# Patient Record
Sex: Female | Born: 1950 | Race: White | Hispanic: No | Smoking: Current every day smoker
Health system: Southern US, Community
[De-identification: ages and names within clinical notes are randomized; demographics above are authoritative.]

## PROBLEM LIST (undated history)

## (undated) DIAGNOSIS — F329 Major depressive disorder, single episode, unspecified: Secondary | ICD-10-CM

## (undated) DIAGNOSIS — I739 Peripheral vascular disease, unspecified: Secondary | ICD-10-CM

## (undated) DIAGNOSIS — M797 Fibromyalgia: Secondary | ICD-10-CM

## (undated) DIAGNOSIS — D649 Anemia, unspecified: Secondary | ICD-10-CM

## (undated) DIAGNOSIS — E785 Hyperlipidemia, unspecified: Secondary | ICD-10-CM

## (undated) DIAGNOSIS — I209 Angina pectoris, unspecified: Secondary | ICD-10-CM

## (undated) DIAGNOSIS — K56600 Partial intestinal obstruction, unspecified as to cause: Secondary | ICD-10-CM

## (undated) DIAGNOSIS — Z5189 Encounter for other specified aftercare: Secondary | ICD-10-CM

## (undated) DIAGNOSIS — Z9189 Other specified personal risk factors, not elsewhere classified: Secondary | ICD-10-CM

## (undated) DIAGNOSIS — IMO0001 Reserved for inherently not codable concepts without codable children: Secondary | ICD-10-CM

## (undated) DIAGNOSIS — I10 Essential (primary) hypertension: Secondary | ICD-10-CM

## (undated) DIAGNOSIS — I2510 Atherosclerotic heart disease of native coronary artery without angina pectoris: Secondary | ICD-10-CM

## (undated) DIAGNOSIS — R0602 Shortness of breath: Secondary | ICD-10-CM

## (undated) DIAGNOSIS — I639 Cerebral infarction, unspecified: Secondary | ICD-10-CM

## (undated) DIAGNOSIS — C55 Malignant neoplasm of uterus, part unspecified: Secondary | ICD-10-CM

## (undated) DIAGNOSIS — F32A Depression, unspecified: Secondary | ICD-10-CM

## (undated) DIAGNOSIS — F419 Anxiety disorder, unspecified: Secondary | ICD-10-CM

## (undated) HISTORY — DX: Fibromyalgia: M79.7

## (undated) HISTORY — DX: Anemia, unspecified: D64.9

## (undated) HISTORY — DX: Hyperlipidemia, unspecified: E78.5

## (undated) HISTORY — PX: OTHER SURGICAL HISTORY: SHX169

## (undated) HISTORY — DX: Cerebral infarction, unspecified: I63.9

## (undated) HISTORY — DX: Other specified personal risk factors, not elsewhere classified: Z91.89

---

## 1975-01-08 DIAGNOSIS — Z9189 Other specified personal risk factors, not elsewhere classified: Secondary | ICD-10-CM

## 1975-01-08 HISTORY — DX: Other specified personal risk factors, not elsewhere classified: Z91.89

## 1998-09-21 ENCOUNTER — Ambulatory Visit (HOSPITAL_COMMUNITY): Admission: RE | Admit: 1998-09-21 | Discharge: 1998-09-21 | Payer: Self-pay | Admitting: Gastroenterology

## 1998-11-01 ENCOUNTER — Emergency Department (HOSPITAL_COMMUNITY): Admission: EM | Admit: 1998-11-01 | Discharge: 1998-11-01 | Payer: Self-pay | Admitting: Emergency Medicine

## 2000-07-08 ENCOUNTER — Encounter: Payer: Self-pay | Admitting: Emergency Medicine

## 2000-07-08 ENCOUNTER — Emergency Department (HOSPITAL_COMMUNITY): Admission: EM | Admit: 2000-07-08 | Discharge: 2000-07-08 | Payer: Self-pay | Admitting: Emergency Medicine

## 2000-07-08 ENCOUNTER — Observation Stay (HOSPITAL_COMMUNITY): Admission: AD | Admit: 2000-07-08 | Discharge: 2000-07-09 | Payer: Self-pay | Admitting: Internal Medicine

## 2000-07-09 ENCOUNTER — Encounter: Payer: Self-pay | Admitting: Internal Medicine

## 2000-07-21 ENCOUNTER — Encounter: Admission: RE | Admit: 2000-07-21 | Discharge: 2000-07-21 | Payer: Self-pay | Admitting: Internal Medicine

## 2002-03-29 ENCOUNTER — Emergency Department (HOSPITAL_COMMUNITY): Admission: EM | Admit: 2002-03-29 | Discharge: 2002-03-29 | Payer: Self-pay | Admitting: Emergency Medicine

## 2002-11-08 ENCOUNTER — Emergency Department (HOSPITAL_COMMUNITY): Admission: AD | Admit: 2002-11-08 | Discharge: 2002-11-08 | Payer: Self-pay | Admitting: Family Medicine

## 2003-04-08 ENCOUNTER — Emergency Department (HOSPITAL_COMMUNITY): Admission: EM | Admit: 2003-04-08 | Discharge: 2003-04-09 | Payer: Self-pay | Admitting: Emergency Medicine

## 2004-02-12 ENCOUNTER — Emergency Department (HOSPITAL_COMMUNITY): Admission: EM | Admit: 2004-02-12 | Discharge: 2004-02-12 | Payer: Self-pay | Admitting: Emergency Medicine

## 2004-05-22 ENCOUNTER — Emergency Department (HOSPITAL_COMMUNITY): Admission: EM | Admit: 2004-05-22 | Discharge: 2004-05-22 | Payer: Self-pay | Admitting: Emergency Medicine

## 2004-07-18 ENCOUNTER — Emergency Department (HOSPITAL_COMMUNITY): Admission: EM | Admit: 2004-07-18 | Discharge: 2004-07-18 | Payer: Self-pay | Admitting: Emergency Medicine

## 2004-09-27 ENCOUNTER — Emergency Department (HOSPITAL_COMMUNITY): Admission: EM | Admit: 2004-09-27 | Discharge: 2004-09-27 | Payer: Self-pay | Admitting: Emergency Medicine

## 2005-01-07 DIAGNOSIS — E8941 Symptomatic postprocedural ovarian failure: Secondary | ICD-10-CM | POA: Insufficient documentation

## 2005-04-01 ENCOUNTER — Emergency Department (HOSPITAL_COMMUNITY): Admission: EM | Admit: 2005-04-01 | Discharge: 2005-04-01 | Payer: Self-pay | Admitting: Emergency Medicine

## 2005-09-11 ENCOUNTER — Ambulatory Visit: Payer: Self-pay | Admitting: Obstetrics and Gynecology

## 2005-09-26 ENCOUNTER — Ambulatory Visit: Payer: Self-pay | Admitting: Obstetrics and Gynecology

## 2005-10-15 ENCOUNTER — Ambulatory Visit (HOSPITAL_COMMUNITY): Admission: RE | Admit: 2005-10-15 | Discharge: 2005-10-15 | Payer: Self-pay | Admitting: Obstetrics and Gynecology

## 2005-11-04 ENCOUNTER — Encounter (INDEPENDENT_AMBULATORY_CARE_PROVIDER_SITE_OTHER): Payer: Self-pay | Admitting: *Deleted

## 2005-11-04 ENCOUNTER — Ambulatory Visit (HOSPITAL_COMMUNITY): Admission: RE | Admit: 2005-11-04 | Discharge: 2005-11-04 | Payer: Self-pay | Admitting: Obstetrics and Gynecology

## 2005-11-04 ENCOUNTER — Ambulatory Visit: Payer: Self-pay | Admitting: Obstetrics and Gynecology

## 2005-11-20 ENCOUNTER — Ambulatory Visit: Payer: Self-pay | Admitting: Obstetrics and Gynecology

## 2006-01-07 HISTORY — PX: ABDOMINAL HYSTERECTOMY: SHX81

## 2006-02-13 ENCOUNTER — Encounter (INDEPENDENT_AMBULATORY_CARE_PROVIDER_SITE_OTHER): Payer: Self-pay | Admitting: *Deleted

## 2006-02-13 ENCOUNTER — Encounter: Payer: Self-pay | Admitting: Obstetrics and Gynecology

## 2006-02-13 ENCOUNTER — Ambulatory Visit: Payer: Self-pay | Admitting: Obstetrics & Gynecology

## 2006-02-20 ENCOUNTER — Ambulatory Visit: Payer: Self-pay | Admitting: Obstetrics and Gynecology

## 2006-03-10 ENCOUNTER — Inpatient Hospital Stay (HOSPITAL_COMMUNITY): Admission: RE | Admit: 2006-03-10 | Discharge: 2006-03-12 | Payer: Self-pay | Admitting: Obstetrics and Gynecology

## 2006-03-10 ENCOUNTER — Ambulatory Visit: Payer: Self-pay | Admitting: Obstetrics and Gynecology

## 2006-03-10 ENCOUNTER — Encounter (INDEPENDENT_AMBULATORY_CARE_PROVIDER_SITE_OTHER): Payer: Self-pay | Admitting: Specialist

## 2006-03-18 ENCOUNTER — Ambulatory Visit: Payer: Self-pay | Admitting: Obstetrics & Gynecology

## 2006-03-20 ENCOUNTER — Ambulatory Visit: Payer: Self-pay | Admitting: Obstetrics and Gynecology

## 2006-03-20 ENCOUNTER — Ambulatory Visit (HOSPITAL_COMMUNITY): Admission: RE | Admit: 2006-03-20 | Discharge: 2006-03-20 | Payer: Self-pay | Admitting: Obstetrics and Gynecology

## 2006-04-03 ENCOUNTER — Ambulatory Visit: Payer: Self-pay | Admitting: Obstetrics & Gynecology

## 2006-04-16 ENCOUNTER — Ambulatory Visit: Payer: Self-pay | Admitting: Obstetrics & Gynecology

## 2006-04-23 ENCOUNTER — Ambulatory Visit: Payer: Self-pay | Admitting: Obstetrics & Gynecology

## 2006-04-30 ENCOUNTER — Ambulatory Visit: Payer: Self-pay | Admitting: Obstetrics and Gynecology

## 2006-07-23 ENCOUNTER — Emergency Department (HOSPITAL_COMMUNITY): Admission: EM | Admit: 2006-07-23 | Discharge: 2006-07-23 | Payer: Self-pay | Admitting: Emergency Medicine

## 2007-01-09 ENCOUNTER — Emergency Department (HOSPITAL_COMMUNITY): Admission: EM | Admit: 2007-01-09 | Discharge: 2007-01-09 | Payer: Self-pay | Admitting: Emergency Medicine

## 2007-08-12 ENCOUNTER — Emergency Department (HOSPITAL_COMMUNITY): Admission: EM | Admit: 2007-08-12 | Discharge: 2007-08-12 | Payer: Self-pay | Admitting: Emergency Medicine

## 2008-02-12 ENCOUNTER — Emergency Department (HOSPITAL_COMMUNITY): Admission: EM | Admit: 2008-02-12 | Discharge: 2008-02-12 | Payer: Self-pay | Admitting: Family Medicine

## 2008-06-28 ENCOUNTER — Emergency Department (HOSPITAL_COMMUNITY): Admission: EM | Admit: 2008-06-28 | Discharge: 2008-06-28 | Payer: Self-pay | Admitting: Emergency Medicine

## 2008-07-15 ENCOUNTER — Inpatient Hospital Stay (HOSPITAL_COMMUNITY): Admission: AD | Admit: 2008-07-15 | Discharge: 2008-07-15 | Payer: Self-pay | Admitting: Obstetrics & Gynecology

## 2009-11-17 ENCOUNTER — Emergency Department (HOSPITAL_COMMUNITY): Admission: EM | Admit: 2009-11-17 | Discharge: 2009-11-17 | Payer: Self-pay | Admitting: Family Medicine

## 2009-12-07 ENCOUNTER — Encounter (INDEPENDENT_AMBULATORY_CARE_PROVIDER_SITE_OTHER): Payer: Self-pay | Admitting: Nurse Practitioner

## 2009-12-07 ENCOUNTER — Ambulatory Visit: Payer: Self-pay | Admitting: Internal Medicine

## 2009-12-07 DIAGNOSIS — F17200 Nicotine dependence, unspecified, uncomplicated: Secondary | ICD-10-CM | POA: Insufficient documentation

## 2009-12-07 DIAGNOSIS — K089 Disorder of teeth and supporting structures, unspecified: Secondary | ICD-10-CM | POA: Insufficient documentation

## 2009-12-21 ENCOUNTER — Telehealth (INDEPENDENT_AMBULATORY_CARE_PROVIDER_SITE_OTHER): Payer: Self-pay | Admitting: Nurse Practitioner

## 2010-02-06 ENCOUNTER — Encounter: Payer: Self-pay | Admitting: Nurse Practitioner

## 2010-02-06 ENCOUNTER — Ambulatory Visit: Admit: 2010-02-06 | Payer: Self-pay | Admitting: Nurse Practitioner

## 2010-02-06 DIAGNOSIS — R599 Enlarged lymph nodes, unspecified: Secondary | ICD-10-CM | POA: Insufficient documentation

## 2010-02-08 NOTE — Progress Notes (Signed)
Summary: Requesting more antibiotics  Phone Note Call from Patient   Summary of Call: HAS DENTAL APPT DEC 29 11,BUT SHE HAS RAN OUT OF ANTIBIOTICS//WANTING TO KNOW IF SHE CVAN GET SOME MORE//[facility]  //[phone]//CLINDANYCYN/HCL 300MG  CAPS. 1 CAP BY MOPUTH 4 TIMES A DAY LAST FILLED 11/18/09//DR.ELLEN FULT Initial call taken by: Arta Bruce,  December 21, 2009 12:32 PM  Follow-up for Phone Call        Sent to N. Daphine Deutscher.  Dutch Quint RN  December 21, 2009 1:00 PM   Additional Follow-up for Phone Call Additional follow up Details #1::        yes, clindamycin refilled discussed with pt during her last visit and i knew that she was already on meds from ER. advise her to keep dental appt as it is VERY difficult to get an appt and she is lucky that they are seeing her so [place] Additional Follow-up by: Lehman Prom FNP,  December 21, 2009 1:21 PM    Additional Follow-up for Phone Call Additional follow up Details #2::    Pt. notified of refill and of Camran Keady's response.  Dutch Quint RN  December 21, 2009 2:22 PM   New/Updated Medications: CLINDAMYCIN HCL 300 MG CAPS (CLINDAMYCIN HCL) One capsule by mouth four times daily Prescriptions: CLINDAMYCIN HCL 300 MG CAPS (CLINDAMYCIN HCL) One capsule by mouth four times daily  #40 x 0   Entered and Authorized by:   Lehman Prom FNP   Signed by:   Lehman Prom FNP on 12/21/2009   Method used:   Faxed to ...       [facility] (retail)       [address] [place], [place]  [address]       Ph: [phone]       Fax: [phone]   RxID:   [id number]

## 2010-02-08 NOTE — Letter (Signed)
Summary: PT INFORMATION SHEET  PT INFORMATION SHEET   Imported By: Arta Bruce 12/07/2009 15:35:55  _____________________________________________________________________  External Attachment:    Type:   Image     Comment:   External Document

## 2010-02-08 NOTE — Assessment & Plan Note (Signed)
Summary: NEW - Establish Care   Vital Signs:  Patient profile:   60 year old female Menstrual status:  postmenopausal Height:      63 inches Weight:      116.38 pounds BMI:     20.69 Temp:     97.7 degrees F oral Pulse rate:   68 / minute Pulse rhythm:   regular Resp:     14 per minute BP sitting:   102 / 56  (left arm) Cuff size:   regular  Vitals Entered By: Hale Drone CMA (December 07, 2009 12:15 PM) CC: Here to establish care. Needs a dental referral. Has been taking her Antbx. since 11/18/09 Is Patient Diabetic? No Pain Assessment Patient in pain? yes     Location: Teeth Intensity: 4 Type: dull Onset of pain  Constant  Does patient need assistance? Functional Status Self care Ambulation Normal     Menstrual Status postmenopausal   CC:  Here to establish care. Needs a dental referral. Has been taking her Antbx. since 11/18/09.  History of Present Illness:  Pt into the office to establish care. No previous PCP She has been to the [facility] back in 2007. Partial hysterectomy.  No signficant PMH  PSH - partial hysterectomy in 2007  Social - employed at [employer] has custody of her 2 grandchildren   Current issues are problems with her teeth.  "Major infection" set in. She went to the [facility] held a couple of weeks ago but she was not able to get care. Pt went to Urgent care following that time and she was started on clindamycin and she has been taking since that time. Pt was having headache, fever, swelling She was also given an RX for percocet but she did not get filled.  She does not want to take narcotic medications nor does she want it in her house with her young kids She has been taking ibuprofen (over the counter) for pain  Habits & Providers  Alcohol-Tobacco-Diet     Alcohol drinks/day: 0     Tobacco Status: current     Tobacco Counseling: to quit use of tobacco products     Year Started: age  16  Exercise-Depression-Behavior     Drug Use: never  Current Medications (verified): 1)  Clindamycin Hcl 300 Mg Caps (Clindamycin Hcl) .Marland Kitchen.. 1 Capusle By Mouth 4 Times Day  Allergies (verified): No Known Drug Allergies  Past History:  Past Surgical History: S/p partial hysterectomy 2007  Family History: mother - breast cancer and colon cancer (deceased at age 84) father - living but pt does not know his history brother - lung cancer (deceased) maternal grandmother - cervical cancer - deceased at age 26  Social History: 2 children (1 addict daughter) custody of her 2 grandchildren separated tobacco - 1ppd ETOH - none Drug - noneSmoking Status:  current Drug Use:  never  Review of Systems General:  +dental pain - right jaw swelling, pain, tenderness. Resp:  Denies cough. GI:  Denies abdominal pain, nausea, and vomiting.  Physical Exam  General:  alert.   Head:  normocephalic.   Eyes:  glasses Ears:  ear piercing(s) noted.   Mouth:  poor dentition.   Multiple caries - root exposure Lungs:  normal breath sounds.   Heart:  normal rate and regular rhythm.   Abdomen:  normal bowel sounds.   Neurologic:  alert & oriented X3.     Impression & Recommendations:  Problem # 1:  DENTAL PAIN (ICD-525.9) will do urgent referral to dental clinic Orders: Dental Referral (Dentist)  Problem # 2:  TOBACCO ABUSE (ICD-305.1) advised cessation  Complete Medication List: 1)  Clindamycin Hcl 300 Mg Caps (Clindamycin hcl) .Marland Kitchen.. 1 capusle by mouth 4 times day  Patient Instructions: 1)  You will be scheduled an appointment at the Dental Clinic 2)  You have declined the flu vaccine today.  If you change your mind be reminded that the flu vaccine only protects you from flu. NOT cold or cough so you don't get the flu from getting the vaccine 3)  Call to schedule a follow up appointment for a complete physical exam. No food after midnight before this visit. 4)  You will get vaginal  exam, labs, EKG,mammogram   Orders Added: 1)  New Patient Level III [09323] 2)  Dental Referral [Dentist]   Not Administered:    Influenza Vaccine not given due to: declined

## 2010-02-09 NOTE — Letter (Signed)
Summary: URGENT DENTAL REFERRAL  URGENT DENTAL REFERRAL   Imported By: Arta Bruce 12/07/2009 16:36:37  _____________________________________________________________________  External Attachment:    Type:   Image     Comment:   External Document

## 2010-02-14 NOTE — Assessment & Plan Note (Signed)
Summary: Acute - LAD   Vital Signs:  Patient profile:   60 year old female Menstrual status:  postmenopausal Weight:      122.6 pounds Pulse rate:   72 / minute Pulse rhythm:   regular Resp:     16 per minute BP sitting:   86 / 60  (left arm) Cuff size:   regular  Vitals Entered By: Levon Hedger (February 06, 2010 10:10 AM) CC: was having discharge that was white and milky out of her left ear x 8 months very itchy, Ear pain Is Patient Diabetic? No Pain Assessment Patient in pain? no       Does patient need assistance? Functional Status Self care Ambulation Normal   CC:  was having discharge that was white and milky out of her left ear x 8 months very itchy and Ear pain.  History of Present Illness:  Pt into the office with c/o ear problems  Ear Pain      This is a 60 year old woman who presents with Ear pain.  The symptoms began 2 weeks ago.  "I noticed a "marble sized" ball at the back of her ear. It did not hurt.  Dentist seen since last visit here and 2 upper teeth removed.  She has others that she need removed and she is waiting funds for Engineer, manufacturing systems.  The patient complains of ear discharge, but denies hearing loss.  The pain is located in the left ear and behind the left ear.  Associated symptoms include toothache.  Admits that she had a virus about 1 week ago.  Symptoms at that time included sneezing, coughing. Pt has been using peroxide and water. No cats as pets  Habits & Providers  Alcohol-Tobacco-Diet     Alcohol drinks/day: 0     Tobacco Status: current     Tobacco Counseling: to quit use of tobacco products     Cigarette Packs/Day: 0.5     Year Started: age 60  Exercise-Depression-Behavior     Does Patient Exercise: no     Drug Use: never  Allergies (verified): No Known Drug Allergies  Social History: Does Patient Exercise:  no Packs/Day:  0.5  Review of Systems General:  Complains of fatigue; denies fever. ENT:  Complains of ear discharge;  left ear.. CV:  Denies chest pain or discomfort. Resp:  Denies cough. GI:  Denies abdominal pain, nausea, and vomiting. Neuro:  Denies headaches.  Physical Exam  General:  alert.   Head:  normocephalic.   Mouth:  poor dentition.   Lungs:  normal breath sounds.   Heart:  normal rate and regular rhythm.   Abdomen:  normal bowel sounds.     Impression & Recommendations:  Problem # 1:  LYMPHADENOPATHY (ICD-785.6) read handout advised pt that it should resolve on its own The following medications were removed from the medication list:    Clindamycin Hcl 300 Mg Caps (Clindamycin hcl) ..... One capsule by mouth four times daily  Problem # 2:  TOBACCO ABUSE (ICD-305.1) advised cessation Advised pt to quit using peroxide in ear to prevent dryness. ointment given to pt  Patient Instructions: 1)  The area behind your ear was an enlarged lymph node. 2)  This is due to a viral infection. 3)  It should improve over time.  If not over the next 1-2 weeks then inform this office. 4)  Left ear - You likely have a chronic problem with your ear. The peroxide actually dries your ears.  You may want to apply something with lubrication on the outer edge. 5)  Keep your appointment for a complete physical exam. 6)  Appointment _____________________. 7)  You will need to be fasting. No food after midnight before this visit. 8)  You will need cbc, cmp, lipids, tsh, PAP, mammogram, EKG, u/a.   Orders Added: 1)  Est. Patient Level III [04540]

## 2010-03-19 ENCOUNTER — Encounter (INDEPENDENT_AMBULATORY_CARE_PROVIDER_SITE_OTHER): Payer: Self-pay | Admitting: Nurse Practitioner

## 2010-03-27 ENCOUNTER — Encounter (INDEPENDENT_AMBULATORY_CARE_PROVIDER_SITE_OTHER): Payer: Self-pay | Admitting: Nurse Practitioner

## 2010-03-27 ENCOUNTER — Encounter: Payer: Self-pay | Admitting: Nurse Practitioner

## 2010-03-27 ENCOUNTER — Other Ambulatory Visit: Payer: Self-pay | Admitting: Nurse Practitioner

## 2010-03-27 ENCOUNTER — Other Ambulatory Visit (HOSPITAL_COMMUNITY): Payer: Self-pay | Admitting: Internal Medicine

## 2010-03-27 DIAGNOSIS — N3000 Acute cystitis without hematuria: Secondary | ICD-10-CM | POA: Insufficient documentation

## 2010-03-27 DIAGNOSIS — Z1231 Encounter for screening mammogram for malignant neoplasm of breast: Secondary | ICD-10-CM

## 2010-03-27 DIAGNOSIS — I739 Peripheral vascular disease, unspecified: Secondary | ICD-10-CM | POA: Insufficient documentation

## 2010-03-27 LAB — CONVERTED CEMR LAB
Bilirubin Urine: NEGATIVE
Glucose, Urine, Semiquant: NEGATIVE
KOH Prep: NEGATIVE
Ketones, urine, test strip: NEGATIVE
Nitrite: POSITIVE
OCCULT 1: NEGATIVE
Protein, U semiquant: NEGATIVE
Rapid HIV Screen: NEGATIVE
Specific Gravity, Urine: >=1.03
Urobilinogen, UA: 0.2
WBC Urine, dipstick: NEGATIVE
pH: 5

## 2010-03-27 LAB — CYTOLOGY - PAP

## 2010-03-28 ENCOUNTER — Encounter (INDEPENDENT_AMBULATORY_CARE_PROVIDER_SITE_OTHER): Payer: Self-pay | Admitting: Nurse Practitioner

## 2010-03-28 DIAGNOSIS — E7800 Pure hypercholesterolemia, unspecified: Secondary | ICD-10-CM | POA: Insufficient documentation

## 2010-03-28 LAB — CONVERTED CEMR LAB
ALT: 9 units/L (ref 0–35)
AST: 16 units/L (ref 0–37)
Albumin: 4.3 g/dL (ref 3.5–5.2)
Alkaline Phosphatase: 64 units/L (ref 39–117)
BUN: 19 mg/dL (ref 6–23)
Basophils Absolute: 0 10*3/uL (ref 0.0–0.1)
Basophils Relative: 0 % (ref 0–1)
CO2: 28 meq/L (ref 19–32)
Calcium: 9.6 mg/dL (ref 8.4–10.5)
Chlamydia, DNA Probe: NEGATIVE
Chloride: 102 meq/L (ref 96–112)
Cholesterol: 233 mg/dL — ABNORMAL HIGH (ref 0–200)
Creatinine, Ser: 0.67 mg/dL (ref 0.40–1.20)
Eosinophils Absolute: 0.1 10*3/uL (ref 0.0–0.7)
Eosinophils Relative: 1 % (ref 0–5)
GC Probe Amp, Genital: NEGATIVE
Glucose, Bld: 87 mg/dL (ref 70–99)
HCT: 43 % (ref 36.0–46.0)
HDL: 57 mg/dL (ref >39)
Hemoglobin: 14.3 g/dL (ref 12.0–15.0)
LDL Cholesterol: 163 mg/dL — ABNORMAL HIGH (ref 0–99)
Lymphocytes Relative: 24 % (ref 12–46)
Lymphs Abs: 2.4 10*3/uL (ref 0.7–4.0)
MCHC: 33.3 g/dL (ref 30.0–36.0)
MCV: 101.2 fL — ABNORMAL HIGH (ref 78.0–100.0)
Monocytes Absolute: 0.5 10*3/uL (ref 0.1–1.0)
Monocytes Relative: 5 % (ref 3–12)
Neutro Abs: 7 10*3/uL (ref 1.7–7.7)
Neutrophils Relative %: 70 % (ref 43–77)
Platelets: 207 10*3/uL (ref 150–400)
Potassium: 4.5 meq/L (ref 3.5–5.3)
RBC: 4.25 M/uL (ref 3.87–5.11)
RDW: 13.6 % (ref 11.5–15.5)
Sodium: 140 meq/L (ref 135–145)
TSH: 1.8 microintl units/mL (ref 0.350–4.500)
Total Bilirubin: 0.6 mg/dL (ref 0.3–1.2)
Total CHOL/HDL Ratio: 4.1
Total Protein: 6.4 g/dL (ref 6.0–8.3)
Triglycerides: 63 mg/dL (ref <150)
VLDL: 13 mg/dL (ref 0–40)
WBC: 10 10*3/uL (ref 4.0–10.5)

## 2010-03-30 ENCOUNTER — Telehealth (INDEPENDENT_AMBULATORY_CARE_PROVIDER_SITE_OTHER): Payer: Self-pay | Admitting: Nurse Practitioner

## 2010-03-30 ENCOUNTER — Encounter (INDEPENDENT_AMBULATORY_CARE_PROVIDER_SITE_OTHER): Payer: Self-pay | Admitting: Nurse Practitioner

## 2010-03-30 DIAGNOSIS — R8789 Other abnormal findings in specimens from female genital organs: Secondary | ICD-10-CM | POA: Insufficient documentation

## 2010-04-05 NOTE — Progress Notes (Signed)
Summary: Office Visit//WELL WOMAN EXAM  Office Visit//WELL WOMAN EXAM   Imported By: Arta Bruce 03/29/2010 15:58:10  _____________________________________________________________________  External Attachment:    Type:   Image     Comment:   External Document

## 2010-04-05 NOTE — Letter (Signed)
Summary: TEST ORDER FORM//MAMMOGRAM//APPPT DATE & TIME  TEST ORDER FORM//MAMMOGRAM//APPPT DATE & TIME   Imported By: Arta Bruce 03/29/2010 15:48:50  _____________________________________________________________________  External Attachment:    Type:   Image     Comment:   External Document

## 2010-04-05 NOTE — Assessment & Plan Note (Signed)
Summary: Complete Physical Exam   Vital Signs:  Patient profile:   60 year old female Menstrual status:  hysterectomy Weight:      119.2 pounds BMI:     21.19 Temp:     97.7 degrees F oral Pulse rate:   61 / minute Pulse rhythm:   regular Resp:     16 per minute BP sitting:   94 / 64  (left arm) Cuff size:   regular  Vitals Entered By: Levon Hedger (March 27, 2010 11:14 AM) CC: CPP Is Patient Diabetic? No Pain Assessment Patient in pain? no       Does patient need assistance? Functional Status Self care Ambulation Normal  Vision Screening:Left eye with correction: 20 / 20+2 Right eye with correction: 20 / 20 Both eyes with correction: 20 / 20+4        Vision Entered By: Levon Hedger (March 27, 2010 11:16 AM)     Menstrual Status hysterectomy   CC:  CPP.  History of Present Illness:  Pt into the office for a complete physical exam  Pap - Hx of abnormal PAP  s/p partial hysterectomy in 2007  Mammogram - last done 2 years ago. Mother with history of breast cancer  Optho - wears presecription glasses.  No recent eye exam.  Dental - last appt was a few months ago for a dental abscess.  she has another upcoming appt to get total extraction  tdap - last given 2.5 years ago  Habits & Providers  Alcohol-Tobacco-Diet     Alcohol drinks/day: 0     Tobacco Status: current     Tobacco Counseling: to quit use of tobacco products     Cigarette Packs/Day: 0.5     Year Started: age 28  Exercise-Depression-Behavior     Does Patient Exercise: no     Have you felt down or hopeless? no     Have you felt little pleasure in things? no     Depression Counseling: not indicated; screening negative for depression     Drug Use: never  Allergies (verified): No Known Drug Allergies  Review of Systems General:  Denies fever. Eyes:  Denies blurring. ENT:  Denies earache. CV:  When trying to walk up an incline then she has pain in bilateral legs.  Pain eases  when she stops. started about 1 year ago and is getting progressively. Resp:  Denies cough. GI:  Denies abdominal pain, nausea, and vomiting. GU:  Denies discharge. MS:  Complains of low back pain. Derm:  Denies lesion(s). Neuro:  Complains of numbness and tingling; bilateral hand and finger. Psych:  Denies anxiety and depression.  Physical Exam  General:  alert.   Head:  normocephalic.   Eyes:  glasses Ears:  left ear - clear crust on external canal right ear - TM visible with bony landmarks present Nose:  no external erythema.   Mouth:  fair dentition.   Neck:  supple.   Chest Wall:  no mass.   Breasts:  right breast tenderness left - no masses noted Lungs:  normal breath sounds.   Heart:  regular rhythm.   Abdomen:  normal bowel sounds.   Rectal:  external hemorrhoid(s).    Pelvic Exam  Vulva:      normal appearance.   Urethra and Bladder:      Urethra--normal.   Vagina:      physiologic discharge.   Cervix:      absent Uterus:      absent Adnexa:  nontender bilaterally.   Rectum:      heme negative stool, + external hemorrhoids.      Impression & Recommendations:  Problem # 1:  ROUTINE GYNECOLOGICAL EXAMINATION (ICD-V72.31) labs done PAP done rec opth and dental  Orders: Hemoccult Guaiac-1 spec.(in office) (82270) UA Dipstick w/o Micro (manual) (16109) EKG w/ Interpretation (93000) KOH/ WET Mount [phone]) Pap Smear, Thin Prep ( Collection of) [phone]) T- GC Chlamydia (91478) T-TSH [phone]) Rapid HIV  (57846) T-Syphilis Test (RPR) [phone]) T-Lipid Profile (24401-02725) T-Comprehensive Metabolic Panel (36644-03474) T-CBC w/Diff (25956-38756)  Problem # 2:  OTHER SCREENING BREAST EXAMINATION (ICD-V76.19)  Orders: Mammogram (Screening) (Mammo)  Problem # 3:  CYSTITIS, ACUTE (ICD-595.0)  Orders: T-Culture, Urine (43329-51884)  Her updated medication list for this problem includes:    Ciprofloxacin Hcl 500 Mg Tabs (Ciprofloxacin  hcl) ..... One tablet by mouth two times a day for infection  Problem # 4:  INTERMITTENT CLAUDICATION (ICD-443.9) very likely since pt has several risk factors  Complete Medication List: 1)  Ciprofloxacin Hcl 500 Mg Tabs (Ciprofloxacin hcl) .... One tablet by mouth two times a day for infection  Patient Instructions: 1)  Start taking a enteric coated Aspirin 81mg  by mouth daily.  This will help some with circulation. 2)  This is likely the problem with your legs as well.  Read the handout.  There are some medications that may can help with this but will see what aspirin does first 3)  You have an infection in your urine.  Be sure to drink plenty of water. You should take cipro 500mg  by mouth two times a day x 7 days. 4)  Keep your appointment for a mammogram. 5)  Follow up in 4 weeks to continue to discuss problems with legs and hands after starting aspirin Prescriptions: CIPROFLOXACIN HCL 500 MG TABS (CIPROFLOXACIN HCL) One tablet by mouth two times a day for infection  #14 x 0   Entered and Authorized by:   Lehman Prom FNP   Signed by:   Hale Drone CMA on 03/27/2010   Method used:   Print then Give to Patient   RxID:   [id number]    Orders Added: 1)  Est. Patient age 6-64 [99396] 2)  Hemoccult Guaiac-1 spec.(in office) [82270] 3)  UA Dipstick w/o Micro (manual) [81002] 4)  EKG w/ Interpretation [93000] 5)  KOH/ WET Mount [87210] 6)  Pap Smear, Thin Prep ( Collection of) [Q0091] 7)  T- GC Chlamydia [55732] 8)  T-TSH [20254-27062] 9)  Rapid HIV  [92370] 10)  T-Syphilis Test (RPR) [37628-31517] 11)  T-Lipid Profile [80061-22930] 12)  T-Comprehensive Metabolic Panel [80053-22900] 13)  T-CBC w/Diff [61607-37106] 14)  T-Culture, Urine [26948-54627] 15)  Mammogram (Screening) [Mammo]    Laboratory Results   Urine Tests  Date/Time Received: March 27, 2010 12:07 PM   Routine Urinalysis   Color: lt. yellow Glucose: negative   (Normal Range: Negative) Bilirubin:  negative   (Normal Range: Negative) Ketone: negative   (Normal Range: Negative) Spec. Gravity: >=1.030   (Normal Range: 1.003-1.035) Blood: large   (Normal Range: Negative) pH: 5.0   (Normal Range: 5.0-8.0) Protein: negative   (Normal Range: Negative) Urobilinogen: 0.2   (Normal Range: 0-1) Nitrite: positive   (Normal Range: Negative) Leukocyte Esterace: negative   (Normal Range: Negative)    Date/Time Received: March 27, 2010 12:48 PM   [employer] [place] Source: vaginal  WBC/hpf: 1-5 Bacteria/hpf: rare Clue cells/hpf: none Yeast/hpf: none Wet Mount KOH: Negative Trichomonas/hpf: none  Other Tests  Rapid HIV: negative  Stool - Occult Blood Hemmoccult #1: negative Date: 03/27/2010     EKG  Procedure date:  03/27/2010  Findings:      sinus - incomplete right BBB

## 2010-04-10 ENCOUNTER — Ambulatory Visit (HOSPITAL_COMMUNITY)
Admission: RE | Admit: 2010-04-10 | Discharge: 2010-04-10 | Disposition: A | Discharge status: Home / Self Care | Payer: Self-pay | Source: Ambulatory Visit | Attending: Internal Medicine | Admitting: Internal Medicine

## 2010-04-10 DIAGNOSIS — Z1231 Encounter for screening mammogram for malignant neoplasm of breast: Secondary | ICD-10-CM | POA: Insufficient documentation

## 2010-04-10 NOTE — Progress Notes (Signed)
Summary: PAP results///// GYN referral  Phone Note Outgoing Call   Summary of Call: notify pt that her PAP showed some abnormal cells. I know that she had a history of abnormal PAP Smear - I can refer to GYN here at [facility] so she can get further info and to ask questions about her ovaries and so forth Initial call taken by: Lehman Prom FNP,  March 30, 2010 5:05 PM  Follow-up for Phone Call        Next [facility] clinic is in June.What would you like for me  to do?  Wait to see response from [place] when she contacts pt to see if she wants to be referred.. If so, then ok to schedule at next available appt Follow-up by: Candi Leash,  April 02, 2010 10:16 AM  Additional Follow-up for Phone Call Additional follow up Details #1::        Levon Hedger  April 03, 2010 10:32 AM Left message on machine for pt ot return call the the office.  Levon Hedger  April 03, 2010 3:02 PM spoke with pt and she is informed of above information and would like to be referred to the GYN clinic at [facility] she needs it to be scheduled on a Tues day or a Thursday.  New Problems: PAP SMEAR, LGSIL, ABNORMAL (ICD-795.09)   Additional Follow-up for Phone Call Additional follow up Details #2::    [facility] - ok to schedule pt a GYN appt on next available Follow-up by: Lehman Prom FNP,  April 04, 2010 8:46 AM  Additional Follow-up for Phone Call Additional follow up Details #3:: Details for Additional Follow-up Action Taken: Will sched pt in [facility] Additional Follow-up by: Candi Leash,  April 04, 2010 9:03 AM  New Problems: PAP SMEAR, LGSIL, ABNORMAL (ICD-795.09)

## 2010-04-15 LAB — CBC
HCT: 40.1 % (ref 36.0–46.0)
Hemoglobin: 14.3 g/dL (ref 12.0–15.0)
MCHC: 35.6 g/dL (ref 30.0–36.0)
MCV: 98.9 fL (ref 78.0–100.0)
Platelets: 176 10*3/uL (ref 150–400)
RBC: 4.05 MIL/uL (ref 3.87–5.11)
RDW: 13.9 % (ref 11.5–15.5)
WBC: 10 10*3/uL (ref 4.0–10.5)

## 2010-04-15 LAB — COMPREHENSIVE METABOLIC PANEL
ALT: 13 U/L (ref 0–35)
AST: 17 U/L (ref 0–37)
Albumin: 3.8 g/dL (ref 3.5–5.2)
Alkaline Phosphatase: 62 U/L (ref 39–117)
BUN: 17 mg/dL (ref 6–23)
CO2: 30 mEq/L (ref 19–32)
Calcium: 9.1 mg/dL (ref 8.4–10.5)
Chloride: 102 mEq/L (ref 96–112)
Creatinine, Ser: 0.7 mg/dL (ref 0.4–1.2)
GFR calc Af Amer: >60 mL/min (ref >60)
GFR calc non Af Amer: >60 mL/min (ref >60)
Glucose, Bld: 87 mg/dL (ref 70–99)
Potassium: 3.8 mEq/L (ref 3.5–5.1)
Sodium: 138 mEq/L (ref 135–145)
Total Bilirubin: 0.3 mg/dL (ref 0.3–1.2)
Total Protein: 6.1 g/dL (ref 6.0–8.3)

## 2010-04-15 LAB — DIFFERENTIAL
Basophils Absolute: 0.1 10*3/uL (ref 0.0–0.1)
Basophils Relative: 1 % (ref 0–1)
Eosinophils Absolute: 0.1 10*3/uL (ref 0.0–0.7)
Eosinophils Relative: 1 % (ref 0–5)
Lymphocytes Relative: 27 % (ref 12–46)
Lymphs Abs: 2.7 10*3/uL (ref 0.7–4.0)
Monocytes Absolute: 0.6 10*3/uL (ref 0.1–1.0)
Monocytes Relative: 6 % (ref 3–12)
Neutro Abs: 6.5 10*3/uL (ref 1.7–7.7)
Neutrophils Relative %: 65 % (ref 43–77)

## 2010-04-15 LAB — RAPID URINE DRUG SCREEN, HOSP PERFORMED
Amphetamines: NOT DETECTED
Barbiturates: NOT DETECTED
Benzodiazepines: NOT DETECTED
Cocaine: NOT DETECTED
Opiates: NOT DETECTED
Tetrahydrocannabinol: NOT DETECTED

## 2010-04-15 LAB — URINALYSIS, ROUTINE W REFLEX MICROSCOPIC
Bilirubin Urine: NEGATIVE
Glucose, UA: NEGATIVE mg/dL
Ketones, ur: NEGATIVE mg/dL
Leukocytes, UA: NEGATIVE
Nitrite: NEGATIVE
Protein, ur: NEGATIVE mg/dL
Specific Gravity, Urine: 1.02 (ref 1.005–1.030)
Urobilinogen, UA: 0.2 mg/dL (ref 0.0–1.0)
pH: 6.5 (ref 5.0–8.0)

## 2010-04-15 LAB — URINE MICROSCOPIC-ADD ON

## 2010-04-24 LAB — POCT RAPID STREP A (OFFICE): Streptococcus, Group A Screen (Direct): NEGATIVE

## 2010-05-25 NOTE — Op Note (Signed)
NAME:  Kimberly [name], Kimberly [name]               ACCOUNT NO.:  [id number]   MEDICAL RECORD NO.:  [id number]          PATIENT TYPE:  INP   LOCATION:                                FACILITY:  [facility]   PHYSICIAN:  Phil D. Okey Dupre, M.D.     DATE OF BIRTH:  Jan 28, 1950   DATE OF PROCEDURE:  03/10/2006  DATE OF DISCHARGE:  03/12/2006                               OPERATIVE REPORT   PROCEDURE:  Total vaginal hysterectomy.   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   POSTOPERATIVE DIAGNOSIS:  Pending pathology report.   SURGEON:  Javier Glazier. Okey Dupre, M.D.   FIRST ASSISTANT:  Shelbie Proctor. Shawnie Pons, M.D.   ESTIMATED BLOOD LOSS:  20 mL.   POSTOPERATIVE CONDITION:  Satisfactory.   ANESTHESIA:  General.   SPECIMEN:  Uterus of less than 250 grams to pathology.   PROCEDURE:  Under satisfactory general anesthesia, the patient in dorsal  lithotomy position; the perineum and vagina were prepped and draped in  the usual sterile manner.  The weighted speculum was placed to the  posterior portion of the vagina and entire circumference of cervix was  injected with 1% Xylocaine with 1:200,000 epinephrine, for a total of 10  mL.  The cervix was grasped with Lahey clamps and a circular incision  made approximately 0.5 cm away from the distal end of the cervix, around  the entire circumference of the cervix, and the mucosa was pushed away  from the distal end of the cervix by blunt dissection.  The peritoneal  cavity was entered beneath the bladder by sharp dissection, and the cul-  de-sac entered by sharp dissection.  The pedicles containing the  uterosacral ligaments were clamped with rear clamps, divided and ligated  with one chromic catgut suture ligature.  These were held along.  Then,  using an electric clamp, the next pedicle was grasped, to include the  uterine vessels and portion of the broad ligament.  This was divided  after coagulation had taken place.  A free tie was then placed through  the opening  into the anterior opening into the peritoneal cavity, and  tied around the remaining pedicle clamp.  A clamp was placed medial to  the aforementioned tie.  The medial tissue was divided.  The lateral  pedicle was ligated with #1 chromic catgut suture ligatures.  The area  was observed for bleeding and as was noted these  were cut short.  The cuff was then closed with a continuous running,  locked 0 Vicryl on an atraumatic needle.  Foley catheter was draining  clear amber urine and placed in the bladder at the end of the procedure.  The patient was transferred to the recovery room in satisfactory  condition, having tolerated the procedure well.           ______________________________  Javier Glazier. Okey Dupre, M.D.     PDR/MEDQ  D:  03/10/2006  T:  03/10/2006  Job:  [id number]

## 2010-05-25 NOTE — H&P (Signed)
NAME:  Kimberly [name], Kimberly [name]               ACCOUNT NO.:  [id number]   MEDICAL RECORD NO.:  [id number]          PATIENT TYPE:  AMB   LOCATION:                                [facility]   PHYSICIAN:  Phil D. Okey Dupre, M.D.     DATE OF BIRTH:  02-26-1950   DATE OF ADMISSION:  03/10/2006  DATE OF DISCHARGE:                              HISTORY & PHYSICAL   SCHEDULED SURGERY:  Monday, March 10, 2006, at 12:30 p.m. at [facility].   CHIEF COMPLAINT:  I have cervical cancer.   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  The pathology showed high-  grade intraepithelial lesion CIN-3, and a high-grade intraepithelial  lesion present in the endocervical margin of the resection.  The patient  came back recently for a Pap smear.  This time the high-grade  intraepithelial CIN-2/VaIN-2, CIN-3/VaIN-3.  This time the high-grade  intraepithelial CIN-2/VaIN-2, CIN-3/VaIN-3 and carcinoma in situ.  She  also had a Trichomonas infection which was treated and she is scheduled  for a total vaginal hysterectomy, bilateral salpingo-oophorectomy.  The  patient has not had a menstrual period in 5-10 years.  She has had no  vaginal bleeding or spotting.  Has few hot flashes.   PAST MEDICAL HISTORY:  Noncontributory.   FAMILY HISTORY:  Had a very strong family history of cancer; her  grandmother, mother, brother, and father have all had cancer of mainly  lung cancer.  The patient is a heavy smoker, a pack of cigarettes a day.   REVIEW OF SYSTEMS:  Numbness and weakening of the fingers and pain in  the legs, especially at night.  She has had some recent vaginal itching  which was treated with Diflucan.  No history of surgery.  No known  medical allergies.   MEDICATIONS:  The patient is on Arthritis Strength Tylenol, fish oil  capsules, and calcium.   PHYSICAL  EXAMINATION:  VITAL SIGNS:  The patient's blood pressure is  108/65, her pulse is 68 per minute, respirations 14 per minute,  temperature 98.6.  The patient is 5 feet 4 inches tall, weighs 140  pounds.  HEENT:  PERRLA, within normal limits.  NECK:  Supple.  Thyroid symmetrical with no dominant masses.  LUNGS:  Clear to auscultation and percussion.  BACK:  Erect.  HEART:  No murmur, normal sinus rhythm.  ABDOMEN:  Soft, flat, nontender.  No masses, no organomegaly.  EXTREMITIES:  No edema, no varices.  Deep tendon reflexes within normal  limits.  GENITALIA:  External genitals are normal, BUS within normal limits.  The  vagina is clean, well rugated.  The cervix is clean, scarred from  previous cone, and parous.  The uterus is anterior; of normal size,  shape consistency.  The adnexa is normal.  Rectal exam is no dominant  masses.   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   PLAN:  Total vaginal hysterectomy and  bilateral salpingo-oophorectomy.  The patient came in with her daughter today and we discussed the  procedure and we explained to her the procedure as well as postoperative  followup.  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  We have counseled the patient as we did before to stop  smoking.  She says it is very difficult.  We told her we would start her  on medication postoperatively, when she goes home to consider stopping  when she is ready.  We talked about the possible complications of the  surgery, especially those related to anesthesia, hemorrhage and  infection, and those related to injury to urinary tract and  gastrointestinal tract.           ______________________________  Javier Glazier Okey Dupre, M.D.     PDR/MEDQ  D:  03/06/2006  T:  03/06/2006  Job:  [id number]

## 2010-05-25 NOTE — Group Therapy Note (Signed)
NAME:  Kimberly [name], OLAES NO.:  [id number]   MEDICAL RECORD NO.:  [id number]          PATIENT TYPE:  WOC   LOCATION:  [facility]                   FACILITY:  [facility]   PHYSICIAN:  Dorthula Perfect, MD     DATE OF BIRTH:  01-Dec-1950   DATE OF SERVICE:                                  CLINIC NOTE   This 60 year old female underwent vaginal hysterectomy for carcinoma in  situ of the cervix.  She was discharged on the second postoperative day.  Surgery was performed March 10, 2006.  She was seen here March 20, 2006  with difficulty voiding.  Urinalysis at that time was abnormal and urine  cultures showed Klebsiella pneumoniae.  She was treated with Macrobid.   She has finished the antibiotic.  She is having to urinate very  frequently and more often than not only voids very small amounts.  She  thinks that perhaps 4 times a day she does get her bladder empty. She  only has to get up once during the night, however, to urinate.  She  feels almost continuous low abdominal pressure.  Energy wise she is  doing well.  She is walking a mild a day.   She has increased her fluid intake and states that she believes that she  drinks about a gallon and a half of fluid a day, including a lot of  milk, some cranberry juice and water.   PHYSICAL EXAMINATION:  VITAL SIGNS:  Height 5 feet 4 inches, weight 137,  blood pressure is 133/79.  ABDOMEN:  Soft, nontender.  PELVIC:  Examination which was performed without speculum in order to do  a bimanual examination to check the size of the bladder IE a very rough  way of determining residual urine notes that there is probably 100 mL or  more in the bladder at this time.  She is somewhat tender.   Urine Multistix shows a large amount of blood, pH 5.0 and positive  nitrites.   The patient has a real income problem now as far as affording any  medications we prescribe.  1. She was given a urine for routine and for cultures.  2. Prescription  for Keflex 500 mg to take one 4 times a day for 10      days, 40 tablets generic is written for.  3. She is given a prescription for Ditropan 10 mg, 14 tablets to take      1 twice a day.  However, because she might not be able to get this      because of her money situation.  4. Most important I have asked her to decrease the huge volume of      fluid intake.  I have asked her to consume no more than 8, 8 ounce      glasses of water with a little bit of this being cranberry juice.      I have asked her not to be drinking milk at this time as that is      more of a food than a fluid to be considered.  I believe a good  part of her problem is the excessive fluid intake and a bladder      that is not able to handle this so soon postop.  She will return      here in 2 weeks.  She may return to work.          ______________________________  Dorthula Perfect, MD    ER/MEDQ  D:  04/03/2006  T:  04/03/2006  Job:  [id number]

## 2010-05-25 NOTE — Group Therapy Note (Signed)
NAME:  HIEN, CUNLIFFE NO.:  [id number]   MEDICAL RECORD NO.:  [id number]          PATIENT TYPE:  WOC   LOCATION:  [facility]                   FACILITY:  [facility]   PHYSICIAN:  Argentina Donovan, MD        DATE OF BIRTH:  Nov 21, 1950   DATE OF SERVICE:                                  CLINIC NOTE   The patient is a 60 year old white English-speaking female gravida 3,  para 2-0-1-2 who had a severely abnormal Pap smear back in August.  Underwent conization of the cervix for CIN II to III.  Pathology showed  high-grade intraepithelial lesion, CIN III, and a high-grade  intraepithelial lesion present at the endocervical margin of the  resection.  The patient came back in recently for a Pap smear.  This  time high-grade intraepithelial lesion CIN II, VAIN II, CIN III VAIN III  and carcinoma in situ.  She also had a trichomonas infection, for which  we will give her Flagyl today, and we had a long discussion with her,  and we are going to schedule her for vaginal hysterectomy.   IMPRESSION:  Carcinoma in situ of the cervix.           ______________________________  Argentina Donovan, MD     PR/MEDQ  D:  02/20/2006  T:  02/20/2006  Job:  [id number]

## 2010-05-25 NOTE — Discharge Summary (Signed)
NAMESEJLA, MARZANO               ACCOUNT NO.:  [id number]   MEDICAL RECORD NO.:  [id number]          PATIENT TYPE:  INP   LOCATION:  9316                          [facility]ACILITY:  [facility]   PHYSICIAN:  Phil D. Okey Dupre, M.D.     DATE OF BIRTH:  01/24/1950   DATE OF ADMISSION:  03/10/2006  DATE OF DISCHARGE:  03/12/2006                               DISCHARGE SUMMARY   The patient is a 60 year old Caucasian female who underwent total  vaginal hysterectomy on the day of admission for carcinoma in situ after  recurrent severe cervical dysplasia following a conization of the  cervix.  The patient did well postoperatively.  On the day of discharge  has a hemoglobin of 12 with a hematocrit of 34.4, down from 14.8 and  42.8 on admission.  Her white count on the day prior to discharge was  20,000; it is 11,600 on the day of discharge.  The patient has been  afebrile since her surgery.  Has not required any additional pain  medication over what was ordered immediately postoperative time.   On examination prior to discharge, the patient's lungs were clear to  auscultation and percussion.  HEART:  No murmur, normal sinus rhythm.  The abdomen was soft, flat, nontender.  No masses, no organomegaly.  No  genital bleeding.  EXTREMITIES:  __________  positive Homans' sign.  No pain, no edema.   The patient is voiding well, passing flatus but not had a bowel movement  as yet.  She has had no sign of any nausea or vomiting.   The patient was given discharge prescriptions the day prior to  discharge, so the family could fill them for Motrin 600 #30 one q.6h.  and Percocet 5 mg #40 one q.4h. p.r.n.  She is also going to be ordered  a Dulcolax suppository which she desires to take when she gets home that  will be given to her prior to discharge.  There will be a follow-up on  this patient in the GYN clinic in 2 weeks.  She has been given discharge  instructions as to activity, especially related to lifting,  stairs and  driving and also follow-up in 2 weeks in the GYN clinic.  She has been  instructed to call or come in if she has any excessive bleeding,  inability to void, increased abdominal girth or high fever.   DISCHARGE DIAGNOSIS:  Is pending pathology report.           ______________________________  Javier Glazier. Okey Dupre, M.D.     PDR/MEDQ  D:  03/12/2006  T:  03/12/2006  Job:  [id number]

## 2010-05-25 NOTE — Group Therapy Note (Signed)
NAME:  Kimberly, [name] NO.:  [id number]   MEDICAL RECORD NO.:  [id number]          PATIENT TYPE:  WOC   LOCATION:  [facility]                   [facility]ACILITY:  [facility]   PHYSICIAN:  Argentina Donovan, MD        DATE OF BIRTH:  1950-04-02   DATE OF SERVICE:  03/20/2006                                  CLINIC NOTE   The patient is a 60 year old white female who underwent total vaginal  hysterectomy on March 10, 2006, for severe cervical dysplasia and  menometrorrhagia.  She had CIN-3 in the cervix, otherwise the uterus was  unremarkable.  The patient came in 2 days ago to the [facility] with pelvic  pressure and urinary hesitancy and frequency.  A urinalysis at the time  showed large blood, large protein and too numerous to count white cells.  The patient was placed on Macrobid and came in today, the symptoms are  much less.  The patient still has a feeling that she cannot completely  empty, but does not have the false urge any more.   PHYSICAL EXAMINATION:  ABDOMEN:  Soft, flat, nontender, no masses or  organomegaly.  PELVIC:  Bimanual pelvic examination, the bladder feels full.  We are  going to have her empty her bladder and then check her for residual  urine.  We have counseled her as to increasing her fluid intake, as it  did show some sign of dehydration when she had her urinalysis the other  day, and she has been constipated in spite of taking stool softeners.   IMPRESSION:  Since she is not taking her Percocet any more that the  constipation is probably due to a lack of fluid intake.  Otherwise  satisfactory progress.  If there is a residual urine we will put in a  Foley catheter.  If there is not, I am going to send her for a pelvic  ultrasound to rule out any kind of collection.           ______________________________  Argentina Donovan, MD     PR/MEDQ  D:  03/20/2006  T:  03/22/2006  Job:  [id number]

## 2010-05-25 NOTE — Group Therapy Note (Signed)
NAME:  Kimberly [name], GALLION NO.:  [id number]   MEDICAL RECORD NO.:  [id number]          PATIENT TYPE:  WOC   LOCATION:  [facility]                   [facility]ACILITY:  [facility]   PHYSICIAN:  Dorthula Perfect, MD     DATE OF BIRTH:  02/06/1950   DATE OF SERVICE:                                  CLINIC NOTE   This 60 year old female returns for results.  She underwent hysterectomy  for carcinoma in situ of the cervix March 10, 2006.  Postoperatively, she  had a Klebsiella infection treated with Macrobid.  She was seen here  April 03, 2006 with urinary frequency.  She had an abnormal urinalysis  that showed a  large amount of blood, positive nitrates, and urine  culture showed Klebsiella again, greater than 100,000 colonies.  The  sensitivity was intermediate for nitro Furadantin, so she was placed on  cephalosporin.  She is taking it 3 times a day rather than 4 times a day  because she has developed yeast infection.  She finishes up the  antibiotic later today or in the morning.   She has cut back on the fluid intake, which was markedly excessive,  which I am sure contributed to the urinary frequency.  She states that  she has no problems with voiding now.   The screening urine today showed a large amount of blood, __________  present.  I did not examine her.  She will return in 1 week for a  urinalysis to check on the amount of blood, but also to have another  urine culture done.  She is return a week after that for the results.  If the urine still shows the presence of blood, and/or she still has  urinary tract infection, she probably needs a urologic workup.  At the  end of our discussion she related that last November she also had a  urinary tract infection.           ______________________________  Dorthula Perfect, MD     ER/MEDQ  D:  04/16/2006  T:  04/16/2006  Job:  [id number]

## 2010-05-25 NOTE — Group Therapy Note (Signed)
NAME:  Kimberly [name], Kimberly [name] NO.:  [id number]   MEDICAL RECORD NO.:  [id number]          PATIENT TYPE:  WOC   LOCATION:  [facility]                   [facility]ILITY:  [facility]   PHYSICIAN:  Dorthula Perfect, MD     DATE OF BIRTH:  1950-02-25   DATE OF SERVICE:  02/13/2006                                  CLINIC NOTE   GYN NOTE   This 60 year old white female, gravida 3, para 2, abortus 1 was seen  here in September with an abnormal Pap smear.  Biopsy showed CIN II-III  and she underwent a cold knife conization of the cervix.  The report  showed high-grade squamous intraepithelial lesion - CIN III.  It showed  the high-grade squamous epithelial lesion is present at the endocervical  margin of the resection and the comment notes that evidence of invasion  is not seen.  There was endocervical gland involvement.  She was  recommended to have a repeat Pap smear in 4 months and she has an  appointment next month to have that done.   She has not had a menstrual period in the past 5-10 years.  She has had  no vaginal bleeding or spotting.  She has a few hot flashes.  She smokes  at least one pack of cigarettes a day.  She had a mammogram this past  October which was normal.   Her complaint today is twofold:  1. She has lower mid abdominal pain.  2. She has back pain that goes down her legs.  The low mid abdominal pain has been present for at least a month.  She  has a lot of cramping.  She does not have urinary frequency but does  have nocturia and goes at least 4 times every night.  This is a definite  change for her.  She is drinking cranberry juice.  She has also had mid  and low abdominal pain for some time now.  This pain she says goes out  into the outside of both upper legs and down to the region of the knee.  This pain is worse when she lifts and when she goes up stairs.  She does  not have any knee numbness there.   She does not have a family physician.  She is not on  Medicaid.  She has  no GI complaints.   PHYSICAL EXAMINATION:  Height 5 foot 4, weight 143.  Blood pressure  108/65.  EXAMINATION OF HER ABDOMEN:  The abdomen is soft.  She does have some  suprapubic area tenderness.  There is no guarding present.  EXAMINATION OF HER BACK reveals low thoracic and upper lumbar tenderness  in the midline.  There is also bilateral sacral sciatic notch area  tenderness greater on the right than the left.  PELVIC EXAM:  External genitalia and BUS glands were normal.  Vaginal  vault was epithelialized.  The cervix deviates slightly to the right and  shows evidence of scarring secondary to the conization.  Bimanual exam  of the uterus is in the midline and I believe is of normal size and  shape.  It is a little difficult because of the tenderness that is  mentioned above.  Adnexal areas are negative.   Clean catch urine was obtained.  Urinalysis here in the clinic reveals  specific gravity of 1.030.  There was a large amount of blood which was  not evident grossly.  Nitrates are negative but she does have a small  amount of leuko esterase of leukocytes present.   DIAGNOSES:  1. Microhematuria, probable bladder infection.  2. Low back pain.  3. History of CIN III.   DISPOSITION:  1. Clean catch urine will be sent for culture.  2. She will return in a week for result of the culture and then      antibiotics.  If the culture is negative, we will need to repeat      the urinalysis and if blood is still present she will need to be      referred to a Urologist.  3. She will need evaluation of her back pain.  Most likely she will      need to be seen by the Orthopedic Service.  The nurses here in the      clinic have her a re-referral to [facility] for financial      evaluation and then an appointment with the Orthopedic Service.      Income-wise, I wonder if she qualifies for Medicaid.  4. I have recommended ibuprofen or Aleve to help with the back  pain.           ______________________________  Dorthula Perfect, MD     ER/MEDQ  D:  02/13/2006  T:  02/13/2006  Job:  [id number]

## 2010-05-25 NOTE — Op Note (Signed)
NAME:  Kimberly [name], Kimberly [name]               ACCOUNT NO.:  [id number]   MEDICAL RECORD NO.:  [id number]          PATIENT TYPE:  AMB   LOCATION:  [facility]                           [facility]ACILITY:  [facility]   PHYSICIAN:  Phil D. Okey Dupre, M.D.     DATE OF BIRTH:  Feb 07, 1950   DATE OF PROCEDURE:  11/04/2005  DATE OF DISCHARGE:                                 OPERATIVE REPORT   PROCEDURE:  Cold knife conization of the cervix.   PREOPERATIVE DIAGNOSIS:  Severe cervical dysplasia, cervical intraepithelial  neoplasia II-III.   POSTOPERATIVE DIAGNOSIS:  Pending pathology report.   SURGEON:  Javier Glazier. Okey Dupre, M.D.   SPECIMEN:  The cervical conization to pathology.   ANESTHESIA:  General.   ESTIMATED BLOOD LOSS:  10 cc.   POSTOPERATIVE CONDITION:  Satisfactory.   PROCEDURE:  Under satisfactory general anesthesia, with the patient in  dorsal lithotomy position, the perineum and vagina were prepped and draped  in the usual sterile manner.  Bimanual pelvic examination under anesthesia  revealed the uterus of normal size, shape, consistency, anterior flexed,  freely movable with normal free adnexa.  A weighted speculum was placed  posterior portion of the vagina through a marital introitus.  BUS was within  normal limits.  Vagina is clean and well rugated, with first-degree  cystocele.  The cervix was grasped with a single-tooth tenaculum, but  came  down very poorly. Lateral sutures of #1 chromic figure-of-eights were placed  in each of the lateral cervical angles, approximately 2 cm from the distal  end using #1 chromic. These were held long for traction. A #1 chromic suture  was then run around the cervix as high as possible very superficially in an  exterior starting at 12 o'clock and going around the entire circumference,  trying to stay at least 1.5-2 cm above the cervical os.  Once this was done,  this was tied down tightly. The cervix was stained with Lugol solution, with  three main areas of nonstaining area at  7 o'clock, at 12 o'clock, and at 3  o'clock. Using an angled knife and staying outside of the nonstained area  which was approximately 1 cm from the edge of the external os, a  circumferential incision was made approximately 0.5 cm deep. The anterior  and posterior lips of the biopsy site were then grasped with Allis clamps,  and the remainder of the conization was carried out, extending up to about  1.5 cm into the cervix.  This was sent for pathological diagnosis.  There  was one bleeding area on the posterior surface of the cervix which was  controlled with figure-of-eight #1 chromic catgut suture. Otherwise, the  operative site was dry.  The angle sutures were cut short and the patient  transferred to the recovery room in satisfactory condition, having tolerated  the procedure well.           ______________________________  Javier Glazier. Okey Dupre, M.D.     PDR/MEDQ  D:  11/04/2005  T:  11/05/2005  Job:  [id number]

## 2010-05-25 NOTE — Group Therapy Note (Signed)
NAME:  Kimberly [name], Kimberly [name] NO.:  [id number]   MEDICAL RECORD NO.:  [id number]          PATIENT TYPE:  WOC   LOCATION:  [facility]                   [facility]ACILITY:  [facility]   PHYSICIAN:  Argentina Donovan, MD        DATE OF BIRTH:  July 18, 1950   DATE OF SERVICE:  09/26/2005                                    CLINIC NOTE   The patient is a 60 year old, gravida 3, para 2-0-1-2, who was seen on  September 5 because of a severely abnormal Pap smear.  On examination there  was a large erosion of the cervix that was very friable and the biopsy  showed high-grade squamous intraepithelial lesion, CIN II-III with some  endocervical atypia.  The patient, because of friability and the seriousness  of the diagnosis, we are going to treat her with a cold knife conization of  the cervix.   SOCIAL HISTORY:  The patient is a smoker.   PAST MEDICAL HISTORY:  She has no serious illnesses.   MEDICATIONS:  She is on aspirin and Aleve for foot pain, which she has been  told not to take during the week prior to surgery.   PHYSICAL EXAMINATION:  She has a normal blood pressure.  She weighs 150  pounds and is 5 feet 4 inches tall, blood pressure 129/74.  HEENT:  Within normal limits.  LUNGS:  Clear to auscultation and percussion.  HEART:  No murmur, normal sinus rhythm.  NECK:  Supple with no masses.  BREASTS:  Symmetrical with no dominant masses.  ABDOMEN:  Soft, flat, nontender.  No masses or organomegaly.  PELVIC EXAM:  External genitalia is normal.  BUS is within normal limits.  The vagina is clean and well rugated.  The cervix is clean and parous and  markedly eroded as previously described.  The uterus is of normal size,  shape, consistency, and the adnexa is normal.  RECTAL EXAM:  Shows no mass,  EXTREMITIES:  No varices and no edema.           ______________________________  Argentina Donovan, MD     PR/MEDQ  D:  09/26/2005  T:  09/28/2005  Job:  [id number]

## 2010-07-28 ENCOUNTER — Emergency Department (HOSPITAL_COMMUNITY)
Admission: EM | Admit: 2010-07-28 | Discharge: 2010-07-29 | Disposition: A | Discharge status: Home / Self Care | Payer: Self-pay | Attending: Emergency Medicine | Admitting: Emergency Medicine

## 2010-07-28 ENCOUNTER — Encounter (HOSPITAL_COMMUNITY): Payer: Self-pay | Admitting: Radiology

## 2010-07-28 ENCOUNTER — Emergency Department (HOSPITAL_COMMUNITY): Payer: Self-pay

## 2010-07-28 DIAGNOSIS — R5383 Other fatigue: Secondary | ICD-10-CM | POA: Insufficient documentation

## 2010-07-28 DIAGNOSIS — R220 Localized swelling, mass and lump, head: Secondary | ICD-10-CM | POA: Insufficient documentation

## 2010-07-28 DIAGNOSIS — H60399 Other infective otitis externa, unspecified ear: Secondary | ICD-10-CM | POA: Insufficient documentation

## 2010-07-28 DIAGNOSIS — H9209 Otalgia, unspecified ear: Secondary | ICD-10-CM | POA: Insufficient documentation

## 2010-07-28 DIAGNOSIS — R5381 Other malaise: Secondary | ICD-10-CM | POA: Insufficient documentation

## 2010-07-28 DIAGNOSIS — F17200 Nicotine dependence, unspecified, uncomplicated: Secondary | ICD-10-CM | POA: Insufficient documentation

## 2010-07-28 DIAGNOSIS — I771 Stricture of artery: Secondary | ICD-10-CM | POA: Insufficient documentation

## 2010-07-28 DIAGNOSIS — Z79899 Other long term (current) drug therapy: Secondary | ICD-10-CM | POA: Insufficient documentation

## 2010-07-28 HISTORY — DX: Malignant neoplasm of uterus, part unspecified: C55

## 2010-07-28 LAB — POCT I-STAT, CHEM 8
BUN: 13 mg/dL (ref 6–23)
Calcium, Ion: 1.16 mmol/L (ref 1.12–1.32)
Chloride: 101 mEq/L (ref 96–112)
Creatinine, Ser: 0.8 mg/dL (ref 0.50–1.10)
Glucose, Bld: 93 mg/dL (ref 70–99)
HCT: 38 % (ref 36.0–46.0)
Hemoglobin: 12.9 g/dL (ref 12.0–15.0)
Potassium: 3.4 mEq/L — ABNORMAL LOW (ref 3.5–5.1)
Sodium: 139 mEq/L (ref 135–145)
TCO2: 28 mmol/L (ref 0–100)

## 2010-07-28 MED ORDER — IOHEXOL 300 MG/ML  SOLN
100.0000 mL | Freq: Once | INTRAMUSCULAR | Status: AC | PRN
  Administered 2010-07-28: 100 mL via INTRAVENOUS

## 2010-08-27 ENCOUNTER — Encounter: Payer: Self-pay | Admitting: Surgery

## 2010-09-21 ENCOUNTER — Encounter: Payer: Self-pay | Admitting: Surgery

## 2010-09-24 ENCOUNTER — Encounter: Payer: Self-pay | Admitting: Surgery

## 2010-09-24 ENCOUNTER — Ambulatory Visit (INDEPENDENT_AMBULATORY_CARE_PROVIDER_SITE_OTHER): Payer: Self-pay | Admitting: Surgery

## 2010-09-24 ENCOUNTER — Other Ambulatory Visit (INDEPENDENT_AMBULATORY_CARE_PROVIDER_SITE_OTHER): Payer: Self-pay

## 2010-09-24 VITALS — BP 75/48 | HR 67 | Ht 64.0 in | Wt 130.0 lb

## 2010-09-24 DIAGNOSIS — M79609 Pain in unspecified limb: Secondary | ICD-10-CM

## 2010-09-24 DIAGNOSIS — I6529 Occlusion and stenosis of unspecified carotid artery: Secondary | ICD-10-CM

## 2010-09-24 DIAGNOSIS — I771 Stricture of artery: Secondary | ICD-10-CM

## 2010-09-24 DIAGNOSIS — I739 Peripheral vascular disease, unspecified: Secondary | ICD-10-CM

## 2010-09-24 NOTE — Progress Notes (Signed)
Vascular and Vein Specialist of [facility]   Patient name: Kimberly [name] MRN: [id number] DOB: [birth year] Sex: female   Referred by: Dala Dock  Reason for referral:  Chief Complaint  Patient presents with  . New Evaluation    Subclavian stenosis per [facility] and ED.  Pt had CT on 07-28-10    HISTORY OF PRESENT ILLNESS: The patient comes in today for evaluation of asymmetric blood pressures. In July of this year she presented to the emergency department with left ear problems they're unable to get adequate blood pressure in the left arm as compared to the right I saw significant discrepancy. 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 The patient does not have symptoms in her left arm. She denies having fatigue with activity she denies syncopal episodes. She has no neurologic symptoms. Her biggest complaint is that she has problem with her legs. She states that when she walks upstairs or inclines her legs get out she feels is mainly in her thighs in her hips. She also has problems with her legs giving out when she walks on flat ground at approximately 50 feet the right leg bothers her worse than the left  The patient has hypercholesterolemia however she has not taken her physician prescribed medications because of joint pain. She is taking herbal medication for her cholesterol. She is also a smoker.  Past Medical History  Diagnosis Date  . Uterine cancer   . H/O: hysterectomy     Past Surgical History  Procedure Date  . Abdominal hysterectomy     History   Social History  . Marital Status: Legally Separated    Spouse Name: N/A    Number of Children: N/A  . Years of Education: N/A   Occupational History  . Not on file.   Social History Main Topics  . Smoking status: Current Everyday Smoker    Types: Cigarettes  . Smokeless tobacco: Not on file  . Alcohol Use: No  . Drug Use: No  . Sexually Active:     Other Topics Concern  . Not on file   Social History Narrative  . No narrative on file    No family history on file.  Allergies as of 09/24/2010  . (No Known Allergies)    Current Outpatient Prescriptions on File Prior to Visit  Medication Sig Dispense Refill  . LORATADINE PO Take by mouth.           REVIEW OF SYSTEMS: Positive for weight gain and weight loss pain in legs with walking and when lying flat shortness of breath with exertion dizziness productive cough or frequent urination at night recent change in eyesight joint pain muscle pain. All other review of systems negative PHYSICAL EXAMINATION: General: The patient appears their stated age.  Vital signs are BP 75/48  Pulse 67  Ht 5\' 4"  (1.626 m)  Wt 130 lb (58.968 kg)  BMI 22.31 kg/m2  SpO2 97% Pulmonary: There is a good air exchange bilaterally without wheezing or rales. Abdomen: Soft and non-tender with normal pitch bowel sounds. Her aorta is easily palpable Musculoskeletal: There are no major deformities.  There is no significant extremity pain. Neurologic: No focal weakness or paresthesias are detected, Skin: There are no ulcer or rashes noted. Psychiatric: The patient has normal affect. Cardiovascular: There is a regular rate and rhythm without significant murmur appreciated. I cannot palpate pedal pulses. She has bilateral carotid bruits. She has palpable femoral pulse  Diagnostic Studies:  Carotid: 60-79% bilateral stenosis Lower; right ABI is 0.58 left is 0.5 to  Outside Studies/Documentation Historical records were reviewed.  They showed the above-mentioned subclavian stenosis on the left  Medication Changes: Pletal 100 mg by mouth twice a day was added Assessment: Peripheral vascular disease Plan:  Lower extremity: I'm start the patient on Pletal to see if this makes a difference in her ability to walk. She'll followup with me in 3 months if she's not had any improvement with her Pletal we will  schedule her for angiogram. Carotid disease: The patient is asymptomatic at this time showed a repeat carotid duplex in 6 months. Subclavian stenosis: The patient is asymptomatic at this time we will continue to follow her symptoms. As long as she did not have left arm weakness and has not experienced syncopal dizzy spells or other neurologic symptoms we will just observe this.   Jorge Ny, M.D. [facility] Specialists of [place] Office: [phone]

## 2010-09-25 NOTE — Progress Notes (Signed)
Addended by: Sharee Pimple on: 09/25/2010 02:41 PM   Modules accepted: Orders

## 2010-10-08 NOTE — Procedures (Unsigned)
CAROTID DUPLEX EXAM  INDICATION:  Carotid bruit.  HISTORY: Diabetes:  No. Cardiac:  No. Hypertension:  No. Smoking:  Yes. Previous Surgery:  No. CV History:  Currently asymptomatic. Amaurosis Fugax No, Paresthesias No, Hemiparesis No Other:  Left subclavian stenosis.                                      RIGHT             LEFT Brachial systolic pressure:         161               111 Brachial Doppler waveforms:         Normal            Abnormal Vertebral direction of flow:        Antegrade         Retrograde DUPLEX VELOCITIES (cm/sec) CCA peak systolic                   71                82 ECA peak systolic                   114               113 ICA peak systolic                   203               257 ICA end diastolic                   65                77 PLAQUE MORPHOLOGY:                  Mixed             Mixed PLAQUE AMOUNT:                      Moderate          Moderate PLAQUE LOCATION:                    ICA, bifurcation  ICA, bifurcation  IMPRESSION: 1. Bilateral internal carotid artery velocities suggest 60%-79%     stenosis. 2. Retrograde left vertebral artery.  ___________________________________________ V. Charlena Cross, MD  EM/MEDQ  D:  09/25/2010  T:  09/25/2010  Job:  [id number]

## 2010-12-24 ENCOUNTER — Ambulatory Visit: Payer: Self-pay | Admitting: Surgery

## 2010-12-24 ENCOUNTER — Other Ambulatory Visit: Payer: Self-pay

## 2011-01-25 ENCOUNTER — Encounter: Payer: Self-pay | Admitting: Surgery

## 2011-01-28 ENCOUNTER — Ambulatory Visit (INDEPENDENT_AMBULATORY_CARE_PROVIDER_SITE_OTHER): Payer: Self-pay | Admitting: Surgery

## 2011-01-28 ENCOUNTER — Encounter: Payer: Self-pay | Admitting: Surgery

## 2011-01-28 VITALS — BP 127/89 | HR 77 | Resp 18 | Ht 64.0 in | Wt 135.0 lb

## 2011-01-28 DIAGNOSIS — I771 Stricture of artery: Secondary | ICD-10-CM

## 2011-01-28 NOTE — Progress Notes (Signed)
Vascular and Vein Specialist of [facility]   Patient name: Kimberly [name] MRN: [id number] DOB: 01-16-1950 Sex: female     Chief Complaint  Patient presents with  . Sub-clavian    3 month follow up on subclavian stenosis  ( Pt could not tolerate Pletal- increased her migraines)    HISTORY OF PRESENT ILLNESS: The patient is back today for followup of her claudication symptoms. I initially saw her 3 months ago for a left subclavian stenosis. She was asymptomatic from this however she does complain of leg problems. ABIs [name] 0.5 range bilaterally without ulceration. I tried her on Pletal she is back today for further followup. The patient was unable to tolerate the Pletal she had not in her stomach as well as migraine headaches. She has multiple complaints today we'll including bilateral arm numbness which is positional left foot pain in the right thigh pain abdominal cramps decreased energy headaches.  Past Medical History  Diagnosis Date  . Uterine cancer   . H/O: hysterectomy   . Asthma   . Hyperlipidemia   . History of lead poisoning 1977    Past Surgical History  Procedure Date  . Abdominal hysterectomy     History   Social History  . Marital Status: Legally Separated    Spouse Name: N/A    Number of Children: N/A  . Years of Education: N/A   Occupational History  . Not on file.   Social History Main Topics  . Smoking status: Current Everyday Smoker -- 0.5 packs/day for 45 years    Types: Cigarettes  . Smokeless tobacco: Not on file  . Alcohol Use: No  . Drug Use: No     History of Cocaine and marijuana abuse.  Marland Kitchen Sexually Active:    Other Topics Concern  . Not on file   Social History Narrative  . No narrative on file    Family History  Problem Relation Age of Onset  . Cancer Mother     Allergies as of 01/28/2011  . (No Known Allergies)    Current Outpatient Prescriptions on File Prior to Visit  Medication Sig Dispense Refill  . aspirin 81 MG tablet  Take 81 mg by mouth daily.        . fish oil-omega-3 fatty acids 1000 MG capsule Take 2 g by mouth daily.        Marland Kitchen LORATADINE PO Take by mouth.        . Multiple Vitamin (MULTIVITAMIN PO) Take by mouth.           REVIEW OF SYSTEMS: Please see history of present illness. Otherwise negative for  PHYSICAL EXAMINATION:   Vital signs are BP 127/89  Pulse 77  Resp 18  Ht 5\' 4"  (1.626 m)  Wt 135 lb (61.236 kg)  BMI 23.17 kg/m2  SpO2 98% General: The patient appears their stated age. HEENT:  No gross abnormalities Pulmonary:  Non labored breathing Musculoskeletal: There are no major deformities. Neurologic: No focal weakness or paresthesias are detected, Skin: There are no ulcer or rashes noted. Psychiatric: The patient has normal affect. Cardiovascular: There is a regular rate and rhythm.  The patient has a significant murmur today which I did not hear 3 months ago. She does not have palpable pedal pulses.   Diagnostic Studies None today  Assessment: Bilateral claudication right greater than left Plan:  the patient's symptoms appear to be lifestyle limiting. She has failed medical therapy. I think the next step is to proceed with arteriogram  and possible intervention. Have scheduled this for next Tuesday, January 29. I will plan on accessing the left groin perform an aortogram with bilateral runoff and intervening on the right leg if possible.  With the patient's possibly new heart murmur, I will try to get her evaluated while she is in the hospital by cardiology.  The patient is artery scheduled for followup carotid ultrasound in 3 months   I recommended she a neurogenic/back problem see her primary care physician regarding her bilateral arm numbness which is positional. This most likely representA neurogenic/back problems   V. Charlena Cross, M.D. [facility] of [place] Office: [phone] Pager:  [phone]

## 2011-01-29 ENCOUNTER — Other Ambulatory Visit: Payer: Self-pay

## 2011-01-30 ENCOUNTER — Encounter (HOSPITAL_COMMUNITY): Payer: Self-pay | Admitting: Pharmacy Technician

## 2011-02-04 MED ORDER — SODIUM CHLORIDE 0.9 % IV SOLN
INTRAVENOUS | Status: DC
  Administered 2011-02-05: 09:00:00 via INTRAVENOUS

## 2011-02-05 ENCOUNTER — Encounter (HOSPITAL_COMMUNITY): Payer: Self-pay | Admitting: General Practice

## 2011-02-05 ENCOUNTER — Ambulatory Visit (HOSPITAL_COMMUNITY): Payer: Medicaid Other

## 2011-02-05 ENCOUNTER — Encounter (HOSPITAL_COMMUNITY)
Admission: RE | Disposition: A | Discharge status: Home / Self Care | Payer: Self-pay | Source: Ambulatory Visit | Attending: Surgery

## 2011-02-05 ENCOUNTER — Other Ambulatory Visit: Payer: Self-pay

## 2011-02-05 ENCOUNTER — Ambulatory Visit (HOSPITAL_COMMUNITY)
Admission: RE | Admit: 2011-02-05 | Discharge: 2011-02-06 | Disposition: A | Discharge status: Home / Self Care | Payer: Medicaid Other | Source: Ambulatory Visit | Attending: Surgery | Admitting: Surgery

## 2011-02-05 DIAGNOSIS — I70219 Atherosclerosis of native arteries of extremities with intermittent claudication, unspecified extremity: Secondary | ICD-10-CM | POA: Insufficient documentation

## 2011-02-05 DIAGNOSIS — J45909 Unspecified asthma, uncomplicated: Secondary | ICD-10-CM | POA: Insufficient documentation

## 2011-02-05 DIAGNOSIS — F17200 Nicotine dependence, unspecified, uncomplicated: Secondary | ICD-10-CM | POA: Insufficient documentation

## 2011-02-05 DIAGNOSIS — E785 Hyperlipidemia, unspecified: Secondary | ICD-10-CM | POA: Insufficient documentation

## 2011-02-05 DIAGNOSIS — Z8542 Personal history of malignant neoplasm of other parts of uterus: Secondary | ICD-10-CM | POA: Insufficient documentation

## 2011-02-05 DIAGNOSIS — I739 Peripheral vascular disease, unspecified: Secondary | ICD-10-CM | POA: Insufficient documentation

## 2011-02-05 DIAGNOSIS — R011 Cardiac murmur, unspecified: Secondary | ICD-10-CM

## 2011-02-05 HISTORY — PX: ABDOMINAL ANGIOGRAM: SHX5499

## 2011-02-05 HISTORY — DX: Shortness of breath: R06.02

## 2011-02-05 HISTORY — DX: Peripheral vascular disease, unspecified: I73.9

## 2011-02-05 SURGERY — ABDOMINAL ANGIOGRAM
Anesthesia: LOCAL

## 2011-02-05 MED ORDER — OMEGA-3-ACID ETHYL ESTERS 1 G PO CAPS
2.0000 g | ORAL_CAPSULE | Freq: Every day | ORAL | Status: DC
  Administered 2011-02-05 – 2011-02-06 (×2): 2 g via ORAL
  Filled 2011-02-05 (×2): qty 2

## 2011-02-05 MED ORDER — OXYCODONE-ACETAMINOPHEN 5-325 MG PO TABS: 1.0000 | ORAL_TABLET | ORAL | Status: DC | PRN

## 2011-02-05 MED ORDER — FENTANYL CITRATE 0.05 MG/ML IJ SOLN
INTRAMUSCULAR | Status: AC
  Filled 2011-02-05: qty 2

## 2011-02-05 MED ORDER — ACETAMINOPHEN 325 MG PO TABS: 650.0000 mg | ORAL_TABLET | ORAL | Status: DC | PRN

## 2011-02-05 MED ORDER — SODIUM CHLORIDE 0.9 % IV SOLN
1.0000 mL/kg/h | INTRAVENOUS | Status: AC
  Administered 2011-02-05: 1 mL/kg/h via INTRAVENOUS

## 2011-02-05 MED ORDER — MIDAZOLAM HCL 2 MG/2ML IJ SOLN
INTRAMUSCULAR | Status: AC
  Filled 2011-02-05: qty 2

## 2011-02-05 MED ORDER — ASPIRIN EC 81 MG PO TBEC
81.0000 mg | DELAYED_RELEASE_TABLET | Freq: Every day | ORAL | Status: DC
  Administered 2011-02-05 – 2011-02-06 (×2): 81 mg via ORAL
  Filled 2011-02-05 (×2): qty 1

## 2011-02-05 MED ORDER — ADULT MULTIVITAMIN W/MINERALS CH
1.0000 | ORAL_TABLET | Freq: Every day | ORAL | Status: DC
  Administered 2011-02-05 – 2011-02-06 (×2): 1 via ORAL
  Filled 2011-02-05 (×2): qty 1

## 2011-02-05 MED ORDER — IOHEXOL 350 MG/ML SOLN
100.0000 mL | Freq: Once | INTRAVENOUS | Status: AC | PRN
  Administered 2011-02-05: 100 mL via INTRAVENOUS

## 2011-02-05 MED ORDER — ONDANSETRON HCL 4 MG/2ML IJ SOLN: 4.0000 mg | Freq: Four times a day (QID) | INTRAMUSCULAR | Status: DC | PRN

## 2011-02-05 MED ORDER — LORATADINE 5 MG/5ML PO SYRP: 10.0000 mg | ORAL_SOLUTION | Freq: Every day | ORAL | Status: DC | PRN

## 2011-02-05 MED ORDER — LIDOCAINE HCL (PF) 1 % IJ SOLN
INTRAMUSCULAR | Status: AC
  Filled 2011-02-05: qty 30

## 2011-02-05 MED ORDER — HEPARIN (PORCINE) IN NACL 2-0.9 UNIT/ML-% IJ SOLN
INTRAMUSCULAR | Status: AC
  Filled 2011-02-05: qty 1000

## 2011-02-05 NOTE — Progress Notes (Signed)
Patient ID: Lasheika Ortloff, female   DOB: 27-Jul-1950, 61 y.o.   MRN: [id number] Reason for Consult: New murmur  Referring Physician: Navy Rothschild is an 61 y.o. female.   HPI: Patient is a 61 yo woman with a h/o peripheral vascular disease, ongoing tobacco abuse, dylipidemia, and chronic dyspea. She is limited by claudication. She underwent angiography of the lower extremities today and is thought to have severe iliac disease. On exam, she was thought to have a new murmur and is referred for evaluation. The patient denies c/p or syncope. No premature h/o CAD.   PMH: Past Medical History  Diagnosis Date  . Uterine cancer   . H/O: hysterectomy   . Asthma   . Hyperlipidemia   . History of lead poisoning 1977  . Shortness of breath   . Peripheral vascular disease     claudication  right greater than left  femoral artery    PSHX: Past Surgical History  Procedure Date  . Abdominal hysterectomy   . Abdominal angiogram     FAMHX: Family History  Problem Relation Age of Onset  . Cancer Mother     Social History:  reports that she has been smoking Cigarettes.  She has a 22.5 pack-year smoking history. She has never used smokeless tobacco. She reports that she does not drink alcohol or use illicit drugs.  Allergies: No Known Allergies   No results found.  ROS  As stated in the HPI and negative for all other systems.  Physical Exam  Vitals:Blood pressure 118/57, pulse 78, temperature 96.9 F (36.1 C), temperature source Oral, resp. rate 18, height 5\' 4"  (1.626 m), weight 58.968 kg (130 lb), SpO2 100.00%.  Pleasant middle age woman, who looks older than her stated age but appearing in NAD HEENT: Unremarkable Neck:  No JVD, no thyromegally Lungs:  Clear with no wheezes. HEART:  Regular rate rhythm, 1/6 systolic murmur at left lower sternal border, no rubs, no clicks Abd:  Flat, positive bowel sounds, no organomegally, no rebound, no guarding Ext:   Pulses as per Dr.  Myra Gianotti, no edema, no cyanosis, no clubbing Skin:  No rashes no nodules Neuro:  CN II through XII intact, motor grossly intact  Assessment/Plan: 1. Cardiac murmur - I suspect this represents mild/mod mitral regurge. Will check a 2 D echo. 2. HTN - continue current meds. 3. Tobacco abuse - she has been encouraged to stop smoking. 4. Cardiac risk - if decision is made to undergo aorta/bifem, I would recommend she have a lexiscan myoview prior to the procedure.  Sharlot Gowda TaylorMD 02/05/2011, 6:16 PM

## 2011-02-05 NOTE — Interval H&P Note (Signed)
History and Physical Interval Note:  02/05/2011 1:02 PM  Kimberly [name]  has presented today for surgery, with the diagnosis of PVD  The various methods of treatment have been discussed with the patient and family. After consideration of risks, benefits and other options for treatment, the patient has consented to  Procedure(s): ABDOMINAL ANGIOGRAM as a surgical intervention .  The patients' history has been reviewed, patient examined, no change in status, stable for surgery.  I have reviewed the patients' chart and labs.  Questions were answered to the patient's satisfaction.     Amol Domanski IV, V. WELLS

## 2011-02-05 NOTE — Op Note (Signed)
Vascular and Vein Specialists of [facility]  Patient name: Kimberly [name] MRN: [id number] DOB: 1950/11/08 Sex: female  02/05/2011 Pre-operative Diagnosis: Bilateral claudication, right greater than left Post-operative diagnosis:  Same Surgeon:  Jorge Ny Procedure Performed:  1.  ultrasound access left femoral artery  2.  aortogram with left femoral runoff     Indications:  The patient presents with multiple complaints most of which stemmed from her leg claudication. Preoperative Doppler studies revealed significant disease she is failed attempt at medical management and comes in today for arteriogram.  Procedure:  The patient was identified in the holding area and taken to room 8.  The patient was then placed supine on the table and prepped and draped in the usual sterile fashion.  A time out was called.  Ultrasound was used to evaluate the left common femoral artery.  It was patent .  A digital ultrasound image was acquired.  A micropuncture needle was used to access the left common femoral artery under ultrasound guidance.  An 018 wire was advanced without resistance and a micropuncture sheath was placed.  The 018 wire was removed and a benson wire was placed.  The micropuncture sheath was exchanged for a 5 french sheath. I then used a KMP catheter and a Benson wire to try to navigate through the common iliac arterial system. I was able to get the catheter up above the aortic bifurcation with some difficulty a contrast injection was performed with the catheter in the distal aorta. The injection revealed that gone into a dissection plane. I then pulled the KMP catheter down into the external iliac artery and shot a retrograde angiogram. This revealed an occluded left common iliac artery. With this finding I elected to terminate the procedure and a CT angiogram to better evaluate the patient's anatomy.  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 There is  reconstitution of the left external iliac artery from the hypogastric artery.  Intervention:  None  Impression:  #1  left common iliac artery occlusion  #2  further evaluation of the aortoiliac arterial system will be done by CT angiogram     V. Durene Cal, M.D. [facility] Specialists of [place] Office: [phone] Pager:  [phone]

## 2011-02-05 NOTE — H&P (View-Only) (Signed)
Vascular and Vein Specialist of [place]   Patient name: Kimberly [name] MRN: [id number] DOB: 12/28/1950 Sex: female     Chief Complaint  Patient presents with  . Sub-clavian    3 month follow up on subclavian stenosis  ( Pt could not tolerate Pletal- increased her migraines)    HISTORY OF PRESENT ILLNESS: The patient is back today for followup of her claudication symptoms. I initially saw her 3 months ago for a left subclavian stenosis. She was asymptomatic from this however she does complain of leg problems. ABIs [name] 0.5 range bilaterally without ulceration. I tried her on Pletal she is back today for further followup. The patient was unable to tolerate the Pletal she had not in her stomach as well as migraine headaches. She has multiple complaints today we'll including bilateral arm numbness which is positional left foot pain in the right thigh pain abdominal cramps decreased energy headaches.  Past Medical History  Diagnosis Date  . Uterine cancer   . H/O: hysterectomy   . Asthma   . Hyperlipidemia   . History of lead poisoning 1977    Past Surgical History  Procedure Date  . Abdominal hysterectomy     History   Social History  . Marital Status: Legally Separated    Spouse Name: N/A    Number of Children: N/A  . Years of Education: N/A   Occupational History  . Not on file.   Social History Main Topics  . Smoking status: Current Everyday Smoker -- 0.5 packs/day for 45 years    Types: Cigarettes  . Smokeless tobacco: Not on file  . Alcohol Use: No  . Drug Use: No     History of Cocaine and marijuana abuse.  . Sexually Active:    Other Topics Concern  . Not on file   Social History Narrative  . No narrative on file    Family History  Problem Relation Age of Onset  . Cancer Mother     Allergies as of 01/28/2011  . (No Known Allergies)    Current Outpatient Prescriptions on File Prior to Visit  Medication Sig Dispense Refill  . aspirin 81 MG tablet  Take 81 mg by mouth daily.        . fish oil-omega-3 fatty acids 1000 MG capsule Take 2 g by mouth daily.        . LORATADINE PO Take by mouth.        . Multiple Vitamin (MULTIVITAMIN PO) Take by mouth.           REVIEW OF SYSTEMS: Please see history of present illness. Otherwise negative for  PHYSICAL EXAMINATION:   Vital signs are BP 127/89  Pulse 77  Resp 18  Ht 5' 4" (1.626 m)  Wt 135 lb (61.236 kg)  BMI 23.17 kg/m2  SpO2 98% General: The patient appears their stated age. HEENT:  No gross abnormalities Pulmonary:  Non labored breathing Musculoskeletal: There are no major deformities. Neurologic: No focal weakness or paresthesias are detected, Skin: There are no ulcer or rashes noted. Psychiatric: The patient has normal affect. Cardiovascular: There is a regular rate and rhythm.  The patient has a significant murmur today which I did not hear 3 months ago. She does not have palpable pedal pulses.   Diagnostic Studies None today  Assessment: Bilateral claudication right greater than left Plan:  the patient's symptoms appear to be lifestyle limiting. She has failed medical therapy. I think the next step is to proceed with arteriogram   and possible intervention. Have scheduled this for next Tuesday, January 29. I will plan on accessing the left groin perform an aortogram with bilateral runoff and intervening on the right leg if possible.  With the patient's possibly new heart murmur, I will try to get her evaluated while she is in the hospital by cardiology.  The patient is artery scheduled for followup carotid ultrasound in 3 months   I recommended she a neurogenic/back problem see her primary care physician regarding her bilateral arm numbness which is positional. This most likely representA neurogenic/back problems   V. Wells Allene Furuya IV, M.D. [facility] of [place] Office: [phone] Pager:  [phone]    

## 2011-02-06 ENCOUNTER — Ambulatory Visit (HOSPITAL_COMMUNITY): Payer: Medicaid Other

## 2011-02-06 DIAGNOSIS — I4891 Unspecified atrial fibrillation: Secondary | ICD-10-CM

## 2011-02-06 DIAGNOSIS — I739 Peripheral vascular disease, unspecified: Secondary | ICD-10-CM

## 2011-02-06 DIAGNOSIS — R0609 Other forms of dyspnea: Secondary | ICD-10-CM

## 2011-02-06 DIAGNOSIS — R0989 Other specified symptoms and signs involving the circulatory and respiratory systems: Secondary | ICD-10-CM

## 2011-02-06 LAB — POCT I-STAT, CHEM 8
BUN: 14 mg/dL (ref 6–23)
Calcium, Ion: 1.16 mmol/L (ref 1.12–1.32)
Chloride: 104 mEq/L (ref 96–112)
Creatinine, Ser: 0.9 mg/dL (ref 0.50–1.10)
Glucose, Bld: 89 mg/dL (ref 70–99)
HCT: 43 % (ref 36.0–46.0)
Hemoglobin: 14.6 g/dL (ref 12.0–15.0)
Potassium: 4 mEq/L (ref 3.5–5.1)
Sodium: 141 mEq/L (ref 135–145)
TCO2: 28 mmol/L (ref 0–100)

## 2011-02-06 MED ORDER — TECHNETIUM TC 99M TETROFOSMIN IV KIT
30.0000 | PACK | Freq: Once | INTRAVENOUS | Status: AC | PRN
  Administered 2011-02-06: 30 via INTRAVENOUS

## 2011-02-06 MED ORDER — IBUPROFEN 800 MG PO TABS
800.0000 mg | ORAL_TABLET | Freq: Three times a day (TID) | ORAL | Status: DC | PRN
  Filled 2011-02-06: qty 1

## 2011-02-06 MED ORDER — REGADENOSON 0.4 MG/5ML IV SOLN
0.4000 mg | Freq: Once | INTRAVENOUS | Status: AC
  Administered 2011-02-06: 0.4 mg via INTRAVENOUS
  Filled 2011-02-06: qty 5

## 2011-02-06 MED ORDER — SODIUM CHLORIDE 0.9 % IJ SOLN
80.0000 mg | Freq: Once | INTRAVENOUS | Status: AC
  Administered 2011-02-06: 80 mg via INTRAVENOUS

## 2011-02-06 MED ORDER — TECHNETIUM TC 99M TETROFOSMIN IV KIT
10.0000 | PACK | Freq: Once | INTRAVENOUS | Status: AC | PRN
  Administered 2011-02-06: 10 via INTRAVENOUS

## 2011-02-06 NOTE — Progress Notes (Signed)
3:31 PM - Pt d/c home with instructions, r/x, and f/u appointments, and SW spoke with pt before d/c.  Pt home with family.  Kimberly [name] 02/06/2011 3:32 PM

## 2011-02-06 NOTE — Progress Notes (Signed)
Patient examined chart reviewed.  Try to arrange echo and myovue today and then D/C for elective vascular surgery if patient can stop smoking Charlton Haws 02/06/2011 8:38 AM

## 2011-02-06 NOTE — Plan of Care (Signed)
Problem: Phase I Progression Outcomes Goal: Other Phase I Outcomes/Goals Outcome: Progressing Myoview & Echo ordered for today, pt in procedure now

## 2011-02-06 NOTE — Discharge Summary (Signed)
Vascular and Vein Specialists Discharge Summary   Patient ID:  Kimberly [name] MRN: [id number] DOB/AGE: 08-Dec-1950 61 y.o.  Admit date: 02/05/2011 Discharge date: 02/06/2011 Date of Surgery: 02/05/2011 Surgeon: Surgeon(s): Seth Bake Durene Cal, MD  Admission Diagnosis: PVD  Discharge Diagnoses:  PVD  Secondary Diagnoses: Past Medical History  Diagnosis Date  . Uterine cancer   . H/O: hysterectomy   . Asthma   . Hyperlipidemia   . History of lead poisoning 1977  . Shortness of breath   . Peripheral vascular disease     claudication  right greater than left  femoral artery    Procedure(s): ABDOMINAL ANGIOGRAM  Discharged Condition: fair  HPI:  The patient is back today for followup of her claudication symptoms. I initially saw her 3 months ago for a left subclavian stenosis. She was asymptomatic from this however she does complain of leg problems. ABIs Kimberly [name] range bilaterally without ulceration. I tried her on Pletal she is back today for further followup. The patient was unable to tolerate the Pletal she had not in her stomach as well as migraine headaches. She has multiple complaints today we'll including bilateral arm numbness which is positional left foot pain in the right thigh pain abdominal cramps decreased energy headaches.   Hospital Course:  Kimberly [name] is a 61 y.o. female is S/P Bilateral Procedure(s): Carotid doppler 60-80% stenosis bil. 1. ultrasound access left femoral artery  2. aortogram with left femoral runoff  3. Carotid doppler 60-80% stenosis bil. Consults:    Cardiology  Lewayne Bunting MD Cardiology Significant Diagnostic Studies: CBC    Component Value Date/Time   WBC 10.0 03/27/2010 2025   RBC 4.25 03/27/2010 2025   HGB 12.9 07/28/2010 2125   HCT 38.0 07/28/2010 2125   PLT 207 03/27/2010 2025   MCV 101.2* 03/27/2010 2025   MCHC 33.3 03/27/2010 2025   RDW 13.6 03/27/2010 2025   LYMPHSABS 2.4 03/27/2010 2025   MONOABS [name] 03/27/2010 2025   EOSABS 0.1  03/27/2010 2025   BASOSABS 0.0 03/27/2010 2025    BMET    Component Value Date/Time   NA 139 07/28/2010 2125   K 3.4* 07/28/2010 2125   CL 101 07/28/2010 2125   CO2 28 03/27/2010 2025   GLUCOSE 93 07/28/2010 2125   BUN 13 07/28/2010 2125   CREATININE 0.80 07/28/2010 2125   CALCIUM 9.6 03/27/2010 2025   GFRNONAA >60 07/15/2008 1705   GFRAA  Value: >60        The eGFR has been calculated using the MDRD equation. This calculation has not been validated in all clinical situations. eGFR's persistently <60 mL/min signify possible Chronic Kidney Disease. 07/15/2008 1705    COAG No results found for this basename: INR, PROTIME     Disposition:  Discharge to :Home Discharge Orders    Future Appointments: Provider: Department: Dept Phone: Center:   03/25/2011 10:30 AM [facility] [phone] [facility]   03/25/2011 11:30 AM V Durene Cal, MD [facility] [phone] [facility]      Kimberly [name]  Home Medication Instructions [id number]:[id number]   Printed on:02/06/11 0753  Medication Information                    fish oil-omega-3 fatty acids 1000 MG capsule Take 2 g by mouth daily.            Multiple Vitamin (MULITIVITAMIN WITH MINERALS) TABS Take 1 tablet by mouth daily.           loratadine (CLARITIN) 5  MG/5ML syrup Take 10 mg by mouth daily as needed. For allergies           aspirin EC 81 MG tablet Take 81 mg by mouth daily.           Impression:  #1 left common iliac artery occlusion  #2 further evaluation of the aortoiliac arterial system will be done by CT angiogram Plan:  Dr. Linnell Fulling will review the cardiac echo and stress test results.  He will call Kimberly [name] to discuss surgery plans Her cell no. Is [phone].  She is off work Designer, jewellery. and works part time Mon./Sat./Sun..  The social worker here did provide her with phone numbers to call for financial help or Medicaid assistance in the future Verbal and written Discharge instructions given to the  patient.   SignedMosetta Pigeon 02/06/2011, 7:53 AM

## 2011-02-06 NOTE — Progress Notes (Signed)
Patient Name: Kimberly [name] Date of Encounter: 02/06/2011  Active Problems:  Principal Problem:  *PVD (peripheral vascular disease) with claudication Active Problems:  TOBACCO ABUSE  INTERMITTENT CLAUDICATION  SUBJECTIVE: Pt had no CP/SOB overnight. Discussed stress test with her, she is agreeable.   OBJECTIVE  Filed Vitals:   02/05/11 1610 02/05/11 1810 02/05/11 2116 02/06/11 0402  BP: 135/67 118/57 99/57 123/66  Pulse: 61 78 73 62  Temp:   98 F (36.7 C) 97.6 F (36.4 C)  TempSrc:   Oral Oral  Resp: 18  18 18   Height:      Weight:      SpO2: 100%  99% 98%    Intake/Output Summary (Last 24 hours) at 02/06/11 0814 Last data filed at 02/05/11 2100  Gross per 24 hour  Intake    480 ml  Output      0 ml  Net    480 ml   Weight change:  Wt Readings from Last 3 Encounters:  02/05/11 130 lb (58.968 kg)  02/05/11 130 lb (58.968 kg)  01/28/11 135 lb (61.236 kg)     PHYSICAL EXAM  General: Well developed, well nourished, in no acute distress. Head: Normocephalic, atraumatic.  Neck: Supple with bilateral bruits but ? Radiation of murmur on left, JVD not elevated. Lungs:  Resp regular and unlabored, CTA bilaterally. Heart: RRR, S1, S2, no S3, S4, possible soft systolic murmur. Abdomen: Soft, non-tender, non-distended, BS + x 4.  Extremities: No clubbing, cyanosis, no edema. Cath site without hematoma or ecchymosis, has bilateral bruits. Neuro: Alert and oriented X 3. Moves all extremities spontaneously. Psych: Normal affect.  LABS:none  TELE:  SR  Radiology/Studies: Ct Angio Ao+bifem W/cm &/or Wo/cm  02/06/2011   Clinical Data:  61 year old with bilateral leg pain.  Evaluate left common iliac artery occlusion.  CT ANGIOGRAPHY OF ABDOMINAL AORTA WITH ILIOFEMORAL RUNOFF  Technique:  Multidetector CT imaging of the abdomen, pelvis and lower extremities was performed using the standard protocol during bolus administration of intravenous contrast.   Comparison:  CT  abdomen 07/15/2008  Findings:  Aorta:  The abdominal aorta is patent.  There is narrowing with post stenotic dilatation at the origin of the celiac trunk.  The SMA is patent but there is close to 50% narrowing in the proximal SMA.  The distal SMA is patent.  Narrowing at the origin of the IMA but the IMA is patent.  There is a critical stenosis near the origin of the right renal artery.  The left renal artery is patent without significant stenosis.  There is an irregular mural thrombus throughout the infrarenal abdominal aorta without aneurysmal dilatation.  There is occlusion of the left common iliac artery with reconstitution at the left iliac bifurcation.  There is flow within the left internal and external iliac arteries.  The left external iliac artery measures 5 mm.  There appears to be a severe stenosis at the origin of the right common iliac artery.  The remainder of the right common iliac artery is diseased but patent. There is flow within the right internal and external iliac arteries.  Mild disease in the right external iliac artery.  Right Lower Extremity:  There is mild plaque in the right common femoral artery.  Mild narrowing at the origin of the right SFA. There is flow in the deep and superficial femoral arteries.  The right SFA and right popliteal artery are patent.  There is three- vessel runoff in the proximal calf.  The anterior  tibial artery is small and occludes in the mid calf.  Primary runoff to the right lower extremity is from the posterior tibial artery and peroneal artery.  There is reconstitution of the dorsalis pedis artery from the posterior tibial artery.  Flow across the ankle from the posterior tibial artery and the dorsalis pedis artery.  Left Lower Extremity:  Mild narrowing in the left common femoral artery.  The left superficial and deep femoral arteries are patent. Left popliteal artery is patent.  There is a three-vessel runoff in the left lower extremity.  Nonvascular  structures:  The lung bases are clear.  No evidence to suggest free air.  Limited evaluation of the solid organs due to the arterial phase of contrast.  There is no gross abnormality in the liver, portal venous system or gallbladder.  Expected heterogeneous arterial uptake in the spleen.  No gross abnormality in the pancreas or adrenal glands.  Normal appearance of both kidneys.  Normal appearance of the appendix.  Uterus has been removed.  Urinary bladder is distended with contrast and no gross abnormality.  No gross abnormality in the adnexa tissues.  No significant free fluid or lymphadenopathy in the abdomen or pelvis.   Review of the MIP images confirms the above findings.  IMPRESSION: Severe aortoiliac disease.  Occlusion of the left common iliac artery.  Severe stenosis at the origin of the right common iliac artery.  No significant outflow or runoff disease.  Two-vessel runoff in the right lower extremity and three-vessel runoff in the left lower extremity.  Critical stenosis at the origin of the right renal artery.  Mild-moderate narrowing involving the celiac trunk and superior mesenteric artery.  Original Report Authenticated By: Richarda Overlie, M.D.    Current Medications:    . aspirin EC  81 mg Oral Daily  . fentaNYL      . heparin      . lidocaine      . midazolam      . mulitivitamin with minerals  1 tablet Oral Daily  . omega-3 acid ethyl esters  2 g Oral Daily  . regadenoson  0.4 mg Intravenous Once    ASSESSMENT AND PLAN: 1. PVD - per [facility], needs Ao-bifem; complete pre-op eval and then d/c, to f/u as an OP with Dr Myra Gianotti for surgery.  2. Multiple CRFs and DOE - Pre-test Myoview today and echo, f/u on results.  [name] , PA-C 8:14 AM 02/06/2011

## 2011-02-06 NOTE — Progress Notes (Signed)
Lexiscan Myoview Pretest - no CP/SOB With Injection, pt had SOB - significant, Bilateral LE pain, head felt "funny". SOB improved but leg pain and HA remained, Aminophylline given.  ECG no obvious ischemic changes.  Images pending.  Pt continued to c/o leg pain, requested Ibuprofen - ordered this.

## 2011-02-06 NOTE — Progress Notes (Signed)
  Echocardiogram 2D Echocardiogram has been performed.  Dorena Cookey 02/06/2011, 2:45 PM

## 2011-02-06 NOTE — Progress Notes (Signed)
Clinical Social Worker received consult for "financial concerns." CSW met with pt to address consult. Pt is having further surgery and will be out of work as a result. CSW provided information about applying for Medicaid and Disability, per request. Pt discharged home. CSW signing off as no further needs identified.   Dede Query, MSW, Theresia Majors [phone]

## 2011-02-06 NOTE — Progress Notes (Signed)
Kimberly [name] 1:28 PM 02/06/2011

## 2011-02-07 NOTE — Discharge Summary (Signed)
Agree with above, will bring back to discuus ABF  Wells Sharea Guinther

## 2011-02-15 ENCOUNTER — Encounter: Payer: Self-pay | Admitting: Surgery

## 2011-02-18 ENCOUNTER — Encounter: Payer: Self-pay | Admitting: Surgery

## 2011-02-18 ENCOUNTER — Ambulatory Visit (INDEPENDENT_AMBULATORY_CARE_PROVIDER_SITE_OTHER): Payer: Self-pay | Admitting: Surgery

## 2011-02-18 VITALS — BP 128/48 | HR 71 | Resp 16 | Ht 64.0 in | Wt 135.0 lb

## 2011-02-18 DIAGNOSIS — I739 Peripheral vascular disease, unspecified: Secondary | ICD-10-CM

## 2011-02-18 DIAGNOSIS — I779 Disorder of arteries and arterioles, unspecified: Secondary | ICD-10-CM

## 2011-02-18 NOTE — Progress Notes (Signed)
[facility] [place]   Patient name: Kimberly [name] MRN: [id number] DOB: 05/04/1950 Sex: female    No chief complaint on file.   HISTORY OF PRESENT ILLNESS: The patient comes in today to discuss her treatment options for her severe bilateral lifestyle limiting claudication. 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 She was kept overnight for a cardiac workup. She had a stress test which showed an ejection fraction of 75% without reversible defects. The patient denies any ulceration. She is very fatigued her walking is limited by pain. She states that this is making her depressed. She's medically managed for hypertension. She continues to be a smoker.  Past Medical History  Diagnosis Date  . Uterine cancer   . H/O: hysterectomy   . Asthma   . Hyperlipidemia   . History of lead poisoning 1977  . Shortness of breath   . Peripheral vascular disease     claudication  right greater than left  femoral artery    Past Surgical History  Procedure Date  . Abdominal hysterectomy   . Abdominal angiogram     History   Social History  . Marital Status: Legally Separated    Spouse Name: N/A    Number of Children: N/A  . Years of Education: N/A   Occupational History  . Not on file.   Social History Main Topics  . Smoking status: Current Everyday Smoker -- 0.5 packs/day for 45 years    Types: Cigarettes  . Smokeless tobacco: Never Used  . Alcohol Use: No  . Drug Use: No     History of Cocaine and marijuana abuse.  . Sexually Active: Not Currently   Other Topics Concern  . Not on file   Social History Narrative  . No narrative on file    Family History  Problem Relation Age of Onset  . Cancer Mother     Allergies as of 02/18/2011  . (No Known Allergies)    Current Outpatient Prescriptions on File Prior to Visit  Medication Sig  Dispense Refill  . aspirin EC 81 MG tablet Take 81 mg by mouth daily.      . fish oil-omega-3 fatty acids 1000 MG capsule Take 2 g by mouth daily.       . loratadine (CLARITIN) 5 MG/5ML syrup Take 10 mg by mouth daily as needed. For allergies      . Multiple Vitamin (MULITIVITAMIN WITH MINERALS) TABS Take 1 tablet by mouth daily.         REVIEW OF SYSTEMS: No changes from prior visit  PHYSICAL EXAMINATION:   Vital signs are BP 128/48  Pulse 71  Resp 16  Ht 5' 4" (1.626 m)  Wt 135 lb (61.236 kg)  BMI 23.17 kg/m2  SpO2 98% General: The patient appears their stated age. HEENT:  No gross abnormalities Pulmonary:  Non labored breathing Abdomen: Soft and non-tender no surgical incisions except from all removal Musculoskeletal: There are no major deformities. Neurologic: No focal weakness or paresthesias are detected, Skin: There are no ulcer or rashes noted. Psychiatric: The patient has normal affect. Cardiovascular: There is a regular rate and rhythmmurmur appreciated.   Diagnostic Studies Carotid duplex: 60-79% stenosis bilaterally ABI on the right is 0.5 date of the left is 0.5 to CT scan shows aortoiliac occlusive disease with no outflow runoff disease  Assessment: Bilateral claudication Plan: The   patient will be scheduled for aortobifemoral bypass graft on Tuesday, February 19. The risks and benefits were discussed with the patient including the risk of death, heart attack, stroke, infection, graft failure, intestinal ischemia, arterial ischemia. All of her questions were answered today. She is continuing to get assistance from social work regarding her financial issues.  V. Wells Brabham IV, M.D. [facility] [place] Office: [phone] Pager:  [phone]   

## 2011-02-20 ENCOUNTER — Encounter (HOSPITAL_COMMUNITY): Payer: Self-pay | Admitting: Pharmacy Technician

## 2011-02-20 ENCOUNTER — Other Ambulatory Visit: Payer: Self-pay

## 2011-02-25 ENCOUNTER — Encounter (HOSPITAL_COMMUNITY): Payer: Self-pay

## 2011-02-25 ENCOUNTER — Ambulatory Visit (HOSPITAL_COMMUNITY)
Admission: RE | Admit: 2011-02-25 | Discharge: 2011-02-25 | Disposition: A | Discharge status: Home / Self Care | Payer: Self-pay | Source: Ambulatory Visit | Attending: Surgery | Admitting: Surgery

## 2011-02-25 ENCOUNTER — Encounter (HOSPITAL_COMMUNITY)
Admission: RE | Admit: 2011-02-25 | Discharge: 2011-02-25 | Disposition: A | Discharge status: Home / Self Care | Payer: Self-pay | Source: Ambulatory Visit | Attending: Surgery | Admitting: Surgery

## 2011-02-25 DIAGNOSIS — J45909 Unspecified asthma, uncomplicated: Secondary | ICD-10-CM | POA: Insufficient documentation

## 2011-02-25 DIAGNOSIS — Z87891 Personal history of nicotine dependence: Secondary | ICD-10-CM | POA: Insufficient documentation

## 2011-02-25 HISTORY — DX: Anxiety disorder, unspecified: F41.9

## 2011-02-25 LAB — CBC
HCT: 38.8 % (ref 36.0–46.0)
Hemoglobin: 13.6 g/dL (ref 12.0–15.0)
MCH: 34.4 pg — ABNORMAL HIGH (ref 26.0–34.0)
MCHC: 35.1 g/dL (ref 30.0–36.0)
MCV: 98.2 fL (ref 78.0–100.0)
Platelets: 208 10*3/uL (ref 150–400)
RBC: 3.95 MIL/uL (ref 3.87–5.11)
RDW: 13.1 % (ref 11.5–15.5)
WBC: 11 10*3/uL — ABNORMAL HIGH (ref 4.0–10.5)

## 2011-02-25 LAB — URINALYSIS, ROUTINE W REFLEX MICROSCOPIC
Bilirubin Urine: NEGATIVE
Glucose, UA: NEGATIVE mg/dL
Ketones, ur: NEGATIVE mg/dL
Leukocytes, UA: NEGATIVE
Nitrite: POSITIVE — AB
Protein, ur: NEGATIVE mg/dL
Specific Gravity, Urine: 1.021 (ref 1.005–1.030)
Urobilinogen, UA: 0.2 mg/dL (ref 0.0–1.0)
pH: 5 (ref 5.0–8.0)

## 2011-02-25 LAB — COMPREHENSIVE METABOLIC PANEL
ALT: 12 U/L (ref 0–35)
AST: 14 U/L (ref 0–37)
Albumin: 3.4 g/dL — ABNORMAL LOW (ref 3.5–5.2)
Alkaline Phosphatase: 70 U/L (ref 39–117)
BUN: 12 mg/dL (ref 6–23)
CO2: 28 mEq/L (ref 19–32)
Calcium: 9.2 mg/dL (ref 8.4–10.5)
Chloride: 102 mEq/L (ref 96–112)
Creatinine, Ser: 0.72 mg/dL (ref 0.50–1.10)
GFR calc Af Amer: >90 mL/min (ref >90)
GFR calc non Af Amer: >90 mL/min (ref >90)
Glucose, Bld: 77 mg/dL (ref 70–99)
Potassium: 3.3 mEq/L — ABNORMAL LOW (ref 3.5–5.1)
Sodium: 140 mEq/L (ref 135–145)
Total Bilirubin: 0.5 mg/dL (ref 0.3–1.2)
Total Protein: 6.5 g/dL (ref 6.0–8.3)

## 2011-02-25 LAB — BLOOD GAS, ARTERIAL
Acid-Base Excess: 2 mmol/L (ref 0.0–2.0)
Bicarbonate: 25.4 mEq/L — ABNORMAL HIGH (ref 20.0–24.0)
FIO2: 0.21 %
O2 Saturation: 98.8 %
Patient temperature: 98.6
TCO2: 26.5 mmol/L (ref 0–100)
pCO2 arterial: 35.2 mmHg (ref 35.0–45.0)
pH, Arterial: 7.472 — ABNORMAL HIGH (ref 7.350–7.400)
pO2, Arterial: 97.6 mmHg (ref 80.0–100.0)

## 2011-02-25 LAB — URINE MICROSCOPIC-ADD ON

## 2011-02-25 LAB — DIFFERENTIAL
Basophils Absolute: 0 10*3/uL (ref 0.0–0.1)
Basophils Relative: 0 % (ref 0–1)
Eosinophils Absolute: 0.1 10*3/uL (ref 0.0–0.7)
Eosinophils Relative: 1 % (ref 0–5)
Lymphocytes Relative: 26 % (ref 12–46)
Lymphs Abs: 2.8 10*3/uL (ref 0.7–4.0)
Monocytes Absolute: 0.6 10*3/uL (ref 0.1–1.0)
Monocytes Relative: 6 % (ref 3–12)
Neutro Abs: 7.4 10*3/uL (ref 1.7–7.7)
Neutrophils Relative %: 67 % (ref 43–77)

## 2011-02-25 LAB — PROTIME-INR
INR: 0.95 (ref 0.00–1.49)
Prothrombin Time: 12.9 seconds (ref 11.6–15.2)

## 2011-02-25 LAB — ABO/RH: ABO/RH(D): O POS

## 2011-02-25 LAB — SURGICAL PCR SCREEN
MRSA, PCR: NEGATIVE
Staphylococcus aureus: NEGATIVE

## 2011-02-25 LAB — APTT: aPTT: 31 seconds (ref 24–37)

## 2011-02-25 MED ORDER — SODIUM CHLORIDE 0.9 % IV SOLN: INTRAVENOUS | Status: DC

## 2011-02-25 MED ORDER — DEXTROSE 5 % IV SOLN
1.5000 g | Freq: Once | INTRAVENOUS | Status: AC
  Administered 2011-02-26: 1.5 g via INTRAVENOUS
  Filled 2011-02-25: qty 1.5

## 2011-02-25 NOTE — Pre-Procedure Instructions (Signed)
20 Kimberly [name]  02/25/2011   Your procedure is scheduled on:  02/26/11  Report to [facility] at 730 AM.  Call this number if you have problems the morning of surgery: [phone]   Remember:   Do not eat food:After Midnight.  May have clear liquids: up to 4 Hours before arrival.  Clear liquids include soda, tea, black coffee, apple or grape juice, broth.  Take these medicines the morning of surgery with A SIP OF WATER: claritin   Do not wear jewelry, make-up or nail polish.  Do not wear lotions, powders, or perfumes. You may wear deodorant.  Do not shave 48 hours prior to surgery.  Do not bring valuables to the hospital.  Contacts, dentures or bridgework may not be worn into surgery.  Leave suitcase in the car. After surgery it may be brought to your room.  For patients admitted to the hospital, checkout time is 11:00 AM the day of discharge.   Patients discharged the day of surgery will not be allowed to drive home.  Name and phone number of your driver: family  Special Instructions: CHG Shower Use Special Wash: 1/2 bottle night before surgery and 1/2 bottle morning of surgery.   Please read over the following fact sheets that you were given: Pain Booklet, Coughing and Deep Breathing, Blood Transfusion Information, MRSA Information and Surgical Site Infection Prevention

## 2011-02-26 ENCOUNTER — Encounter (HOSPITAL_COMMUNITY): Payer: Self-pay | Admitting: Anesthesiology

## 2011-02-26 ENCOUNTER — Ambulatory Visit (HOSPITAL_COMMUNITY): Payer: Medicaid Other

## 2011-02-26 ENCOUNTER — Inpatient Hospital Stay (HOSPITAL_COMMUNITY)
Admission: RE | Admit: 2011-02-26 | Discharge: 2011-03-07 | DRG: 238 | Disposition: A | Discharge status: Home / Self Care | Payer: Medicaid Other | Source: Ambulatory Visit | Attending: Surgery | Admitting: Surgery

## 2011-02-26 ENCOUNTER — Ambulatory Visit (HOSPITAL_COMMUNITY): Payer: Medicaid Other | Admitting: Anesthesiology

## 2011-02-26 ENCOUNTER — Other Ambulatory Visit: Payer: Self-pay | Admitting: Surgery

## 2011-02-26 ENCOUNTER — Other Ambulatory Visit: Payer: Self-pay

## 2011-02-26 ENCOUNTER — Encounter (HOSPITAL_COMMUNITY)
Admission: RE | Disposition: A | Discharge status: Home / Self Care | Payer: Self-pay | Source: Ambulatory Visit | Attending: Surgery

## 2011-02-26 DIAGNOSIS — I10 Essential (primary) hypertension: Secondary | ICD-10-CM | POA: Diagnosis present

## 2011-02-26 DIAGNOSIS — Z8542 Personal history of malignant neoplasm of other parts of uterus: Secondary | ICD-10-CM

## 2011-02-26 DIAGNOSIS — Z90710 Acquired absence of both cervix and uterus: Secondary | ICD-10-CM

## 2011-02-26 DIAGNOSIS — I739 Peripheral vascular disease, unspecified: Principal | ICD-10-CM | POA: Diagnosis present

## 2011-02-26 DIAGNOSIS — F17200 Nicotine dependence, unspecified, uncomplicated: Secondary | ICD-10-CM | POA: Diagnosis present

## 2011-02-26 DIAGNOSIS — E876 Hypokalemia: Secondary | ICD-10-CM | POA: Diagnosis not present

## 2011-02-26 DIAGNOSIS — I70219 Atherosclerosis of native arteries of extremities with intermittent claudication, unspecified extremity: Secondary | ICD-10-CM | POA: Diagnosis present

## 2011-02-26 DIAGNOSIS — D62 Acute posthemorrhagic anemia: Secondary | ICD-10-CM | POA: Diagnosis not present

## 2011-02-26 HISTORY — PX: AORTA - BILATERAL FEMORAL ARTERY BYPASS GRAFT: SHX1175

## 2011-02-26 HISTORY — PX: PR VEIN BYPASS GRAFT,AORTO-FEM-POP: 35551

## 2011-02-26 LAB — MAGNESIUM: Magnesium: 1.7 mg/dL (ref 1.5–2.5)

## 2011-02-26 LAB — PREPARE RBC (CROSSMATCH)

## 2011-02-26 LAB — BASIC METABOLIC PANEL
BUN: 14 mg/dL (ref 6–23)
CO2: 26 mEq/L (ref 19–32)
Calcium: 8 mg/dL — ABNORMAL LOW (ref 8.4–10.5)
Chloride: 105 mEq/L (ref 96–112)
Creatinine, Ser: 0.63 mg/dL (ref 0.50–1.10)
GFR calc Af Amer: >90 mL/min (ref >90)
GFR calc non Af Amer: >90 mL/min (ref >90)
Glucose, Bld: 154 mg/dL — ABNORMAL HIGH (ref 70–99)
Potassium: 3.7 mEq/L (ref 3.5–5.1)
Sodium: 135 mEq/L (ref 135–145)

## 2011-02-26 LAB — PROTIME-INR
INR: 1.08 (ref 0.00–1.49)
Prothrombin Time: 14.2 seconds (ref 11.6–15.2)

## 2011-02-26 LAB — BLOOD GAS, ARTERIAL
Acid-Base Excess: 0.3 mmol/L (ref 0.0–2.0)
Bicarbonate: 25.2 mEq/L — ABNORMAL HIGH (ref 20.0–24.0)
Drawn by: 249101
O2 Content: 3 L/min
O2 Saturation: 93.6 %
Patient temperature: 98.6
TCO2: 26.6 mmol/L (ref 0–100)
pCO2 arterial: 46.2 mmHg — ABNORMAL HIGH (ref 35.0–45.0)
pH, Arterial: 7.355 (ref 7.350–7.400)
pO2, Arterial: 66.6 mmHg — ABNORMAL LOW (ref 80.0–100.0)

## 2011-02-26 LAB — CBC
HCT: 30.4 % — ABNORMAL LOW (ref 36.0–46.0)
Hemoglobin: 10.4 g/dL — ABNORMAL LOW (ref 12.0–15.0)
MCH: 33.3 pg (ref 26.0–34.0)
MCHC: 34.2 g/dL (ref 30.0–36.0)
MCV: 97.4 fL (ref 78.0–100.0)
Platelets: 174 10*3/uL (ref 150–400)
RBC: 3.12 MIL/uL — ABNORMAL LOW (ref 3.87–5.11)
RDW: 13 % (ref 11.5–15.5)
WBC: 16.3 10*3/uL — ABNORMAL HIGH (ref 4.0–10.5)

## 2011-02-26 LAB — GLUCOSE, CAPILLARY: Glucose-Capillary: 132 mg/dL — ABNORMAL HIGH (ref 70–99)

## 2011-02-26 LAB — APTT: aPTT: 33 seconds (ref 24–37)

## 2011-02-26 SURGERY — CREATION, BYPASS, ARTERIAL, AORTA TO FEMORAL, BILATERAL, USING GRAFT
Anesthesia: General | Site: Abdomen | Wound class: Clean

## 2011-02-26 MED ORDER — SODIUM CHLORIDE 0.9 % IR SOLN
Status: DC | PRN
  Administered 2011-02-26: 13:00:00

## 2011-02-26 MED ORDER — PHENYLEPHRINE HCL 10 MG/ML IJ SOLN
10.0000 mg | INTRAVENOUS | Status: DC | PRN
  Administered 2011-02-26: 50 ug/min via INTRAVENOUS

## 2011-02-26 MED ORDER — FENTANYL CITRATE 0.05 MG/ML IJ SOLN
INTRAMUSCULAR | Status: DC | PRN
  Administered 2011-02-26: 25 ug via INTRAVENOUS
  Administered 2011-02-26 (×4): 50 ug via INTRAVENOUS
  Administered 2011-02-26: 100 ug via INTRAVENOUS
  Administered 2011-02-26: 50 ug via INTRAVENOUS
  Administered 2011-02-26: 25 ug via INTRAVENOUS
  Administered 2011-02-26 (×5): 50 ug via INTRAVENOUS
  Administered 2011-02-26: 100 ug via INTRAVENOUS

## 2011-02-26 MED ORDER — NEOSTIGMINE METHYLSULFATE 1 MG/ML IJ SOLN
INTRAMUSCULAR | Status: DC | PRN
  Administered 2011-02-26: 4 mg via INTRAVENOUS

## 2011-02-26 MED ORDER — NALOXONE HCL 0.4 MG/ML IJ SOLN: 0.4000 mg | INTRAMUSCULAR | Status: DC | PRN

## 2011-02-26 MED ORDER — MORPHINE SULFATE (PF) 1 MG/ML IV SOLN
INTRAVENOUS | Status: DC
  Administered 2011-02-26: 21:00:00 via INTRAVENOUS
  Administered 2011-02-27: 16.5 mg via INTRAVENOUS
  Administered 2011-02-27: 4.5 mg via INTRAVENOUS
  Administered 2011-02-27: 9.5 mg via INTRAVENOUS
  Administered 2011-02-27: 1.5 mg via INTRAVENOUS
  Administered 2011-02-27: 6.49 mg via INTRAVENOUS
  Administered 2011-02-28: 3 mg via INTRAVENOUS
  Administered 2011-02-28: 6 mg via INTRAVENOUS
  Administered 2011-02-28: 2.58 mg via INTRAVENOUS
  Administered 2011-02-28: 1.5 mg via INTRAVENOUS
  Administered 2011-02-28: 4.5 mg via INTRAVENOUS
  Administered 2011-02-28: 3 mg via INTRAVENOUS
  Administered 2011-03-01: 1.5 mg via INTRAVENOUS
  Administered 2011-03-01: 3 mg via INTRAVENOUS
  Administered 2011-03-01: 6 mg via INTRAVENOUS
  Administered 2011-03-01: 3.92 mg via INTRAVENOUS
  Administered 2011-03-01 (×2): 6 mg via INTRAVENOUS
  Administered 2011-03-02: 5 mg via INTRAVENOUS
  Administered 2011-03-02: 3.92 mg via INTRAVENOUS
  Administered 2011-03-02: 6 mg via INTRAVENOUS
  Administered 2011-03-02: 21:00:00 via INTRAVENOUS
  Administered 2011-03-02: 6 mg via INTRAVENOUS
  Administered 2011-03-02: 1 mg via INTRAVENOUS
  Administered 2011-03-03: 3.5 mg via INTRAVENOUS
  Administered 2011-03-03: 4.5 mg via INTRAVENOUS
  Administered 2011-03-03: 7.5 mg via INTRAVENOUS
  Administered 2011-03-03: 18:00:00 via INTRAVENOUS
  Administered 2011-03-03: 6 mg via INTRAVENOUS
  Administered 2011-03-03: 3 mg via INTRAVENOUS
  Filled 2011-02-26: qty 25

## 2011-02-26 MED ORDER — FAMOTIDINE IN NACL 20-0.9 MG/50ML-% IV SOLN
20.0000 mg | Freq: Two times a day (BID) | INTRAVENOUS | Status: DC
  Administered 2011-02-26 – 2011-03-04 (×12): 20 mg via INTRAVENOUS
  Filled 2011-02-26 (×13): qty 50

## 2011-02-26 MED ORDER — MIDAZOLAM HCL 5 MG/5ML IJ SOLN
INTRAMUSCULAR | Status: DC | PRN
  Administered 2011-02-26: 2 mg via INTRAVENOUS

## 2011-02-26 MED ORDER — GLYCOPYRROLATE 0.2 MG/ML IJ SOLN
INTRAMUSCULAR | Status: DC | PRN
  Administered 2011-02-26: .6 mg via INTRAVENOUS

## 2011-02-26 MED ORDER — MEPERIDINE HCL 25 MG/ML IJ SOLN: 6.2500 mg | INTRAMUSCULAR | Status: DC | PRN

## 2011-02-26 MED ORDER — MAGNESIUM SULFATE 40 MG/ML IJ SOLN: 2.0000 g | Freq: Once | INTRAMUSCULAR | Status: AC | PRN

## 2011-02-26 MED ORDER — PHENOL 1.4 % MT LIQD
1.0000 | OROMUCOSAL | Status: DC | PRN
  Filled 2011-02-26: qty 177

## 2011-02-26 MED ORDER — MORPHINE SULFATE (PF) 1 MG/ML IV SOLN
INTRAVENOUS | Status: AC
  Filled 2011-02-26: qty 25

## 2011-02-26 MED ORDER — POTASSIUM CHLORIDE CRYS ER 20 MEQ PO TBCR: 20.0000 meq | EXTENDED_RELEASE_TABLET | Freq: Once | ORAL | Status: AC | PRN

## 2011-02-26 MED ORDER — DEXTROSE 5 % IV SOLN
1.5000 g | Freq: Two times a day (BID) | INTRAVENOUS | Status: AC
  Administered 2011-02-26 – 2011-02-27 (×2): 1.5 g via INTRAVENOUS
  Filled 2011-02-26 (×2): qty 1.5

## 2011-02-26 MED ORDER — LACTATED RINGERS IV SOLN
INTRAVENOUS | Status: DC | PRN
  Administered 2011-02-26 (×3): via INTRAVENOUS

## 2011-02-26 MED ORDER — HYDROMORPHONE HCL PF 1 MG/ML IJ SOLN
INTRAMUSCULAR | Status: AC
  Filled 2011-02-26: qty 1

## 2011-02-26 MED ORDER — PHENYLEPHRINE HCL 10 MG/ML IJ SOLN
INTRAMUSCULAR | Status: DC | PRN
  Administered 2011-02-26: 120 ug via INTRAVENOUS
  Administered 2011-02-26 (×2): 40 ug via INTRAVENOUS

## 2011-02-26 MED ORDER — 0.9 % SODIUM CHLORIDE (POUR BTL) OPTIME
TOPICAL | Status: DC | PRN
  Administered 2011-02-26: 1000 mL

## 2011-02-26 MED ORDER — ACETAMINOPHEN 325 MG PO TABS
325.0000 mg | ORAL_TABLET | ORAL | Status: DC | PRN
  Administered 2011-03-05 – 2011-03-07 (×5): 650 mg via ORAL
  Filled 2011-02-26 (×5): qty 2

## 2011-02-26 MED ORDER — ROCURONIUM BROMIDE 100 MG/10ML IV SOLN
INTRAVENOUS | Status: DC | PRN
  Administered 2011-02-26: 50 mg via INTRAVENOUS

## 2011-02-26 MED ORDER — METOPROLOL TARTRATE 1 MG/ML IV SOLN: 2.0000 mg | INTRAVENOUS | Status: DC | PRN

## 2011-02-26 MED ORDER — DIPHENHYDRAMINE HCL 50 MG/ML IJ SOLN: 12.5000 mg | Freq: Four times a day (QID) | INTRAMUSCULAR | Status: DC | PRN

## 2011-02-26 MED ORDER — ONDANSETRON HCL 4 MG/2ML IJ SOLN
INTRAMUSCULAR | Status: DC | PRN
  Administered 2011-02-26: 4 mg via INTRAVENOUS

## 2011-02-26 MED ORDER — ACETAMINOPHEN 650 MG RE SUPP: 325.0000 mg | RECTAL | Status: DC | PRN

## 2011-02-26 MED ORDER — PROPOFOL 10 MG/ML IV EMUL
INTRAVENOUS | Status: DC | PRN
  Administered 2011-02-26: 200 mg via INTRAVENOUS

## 2011-02-26 MED ORDER — VECURONIUM BROMIDE 10 MG IV SOLR
INTRAVENOUS | Status: DC | PRN
  Administered 2011-02-26: 1 mg via INTRAVENOUS
  Administered 2011-02-26: 2 mg via INTRAVENOUS
  Administered 2011-02-26: 3 mg via INTRAVENOUS

## 2011-02-26 MED ORDER — MORPHINE SULFATE 2 MG/ML IJ SOLN: 0.0500 mg/kg | INTRAMUSCULAR | Status: DC | PRN

## 2011-02-26 MED ORDER — HEMOSTATIC AGENTS (NO CHARGE) OPTIME
TOPICAL | Status: DC | PRN
Start: 2011-02-26 — End: 2011-02-26
  Administered 2011-02-26: 1 via TOPICAL

## 2011-02-26 MED ORDER — NITROGLYCERIN IN D5W 200-5 MCG/ML-% IV SOLN: 5.0000 ug/min | INTRAVENOUS | Status: DC

## 2011-02-26 MED ORDER — PROTAMINE SULFATE 10 MG/ML IV SOLN
INTRAVENOUS | Status: DC | PRN
  Administered 2011-02-26: 50 mg via INTRAVENOUS

## 2011-02-26 MED ORDER — LABETALOL HCL 5 MG/ML IV SOLN
INTRAVENOUS | Status: DC | PRN
  Administered 2011-02-26 (×2): 10 mg via INTRAVENOUS

## 2011-02-26 MED ORDER — SODIUM CHLORIDE 0.9 % IV SOLN: 500.0000 mL | Freq: Once | INTRAVENOUS | Status: AC | PRN

## 2011-02-26 MED ORDER — HYDRALAZINE HCL 20 MG/ML IJ SOLN
10.0000 mg | INTRAMUSCULAR | Status: DC | PRN
  Filled 2011-02-26: qty 0.5

## 2011-02-26 MED ORDER — HETASTARCH-ELECTROLYTES 6 % IV SOLN
INTRAVENOUS | Status: DC | PRN
  Administered 2011-02-26: 14:00:00 via INTRAVENOUS

## 2011-02-26 MED ORDER — DOPAMINE-DEXTROSE 3.2-5 MG/ML-% IV SOLN: 3.0000 ug/kg/min | INTRAVENOUS | Status: DC

## 2011-02-26 MED ORDER — ONDANSETRON HCL 4 MG/2ML IJ SOLN
4.0000 mg | Freq: Four times a day (QID) | INTRAMUSCULAR | Status: DC | PRN
  Administered 2011-03-04 (×2): 4 mg via INTRAVENOUS
  Filled 2011-02-26 (×2): qty 2

## 2011-02-26 MED ORDER — ALBUTEROL SULFATE (5 MG/ML) 0.5% IN NEBU: 2.5000 mg | INHALATION_SOLUTION | Freq: Four times a day (QID) | RESPIRATORY_TRACT | Status: DC | PRN

## 2011-02-26 MED ORDER — HYDROMORPHONE HCL PF 1 MG/ML IJ SOLN
0.2500 mg | INTRAMUSCULAR | Status: DC | PRN
  Administered 2011-02-26 (×4): 0.5 mg via INTRAVENOUS

## 2011-02-26 MED ORDER — HEPARIN SODIUM (PORCINE) 1000 UNIT/ML IJ SOLN
INTRAMUSCULAR | Status: DC | PRN
  Administered 2011-02-26: 6000 [IU] via INTRAVENOUS

## 2011-02-26 MED ORDER — LABETALOL HCL 5 MG/ML IV SOLN
10.0000 mg | INTRAVENOUS | Status: DC | PRN
  Filled 2011-02-26: qty 4

## 2011-02-26 MED ORDER — NITROGLYCERIN IN D5W 200-5 MCG/ML-% IV SOLN
INTRAVENOUS | Status: DC | PRN
  Administered 2011-02-26: 5 ug/min via INTRAVENOUS

## 2011-02-26 MED ORDER — DIPHENHYDRAMINE HCL 12.5 MG/5ML PO ELIX
12.5000 mg | ORAL_SOLUTION | Freq: Four times a day (QID) | ORAL | Status: DC | PRN
  Filled 2011-02-26: qty 5

## 2011-02-26 MED ORDER — LACTATED RINGERS IV SOLN
INTRAVENOUS | Status: DC | PRN
  Administered 2011-02-26: 11:00:00 via INTRAVENOUS

## 2011-02-26 MED ORDER — ARTIFICIAL TEARS OP OINT
TOPICAL_OINTMENT | OPHTHALMIC | Status: DC | PRN
  Administered 2011-02-26: 1 via OPHTHALMIC

## 2011-02-26 MED ORDER — GUAIFENESIN-DM 100-10 MG/5ML PO SYRP: 15.0000 mL | ORAL_SOLUTION | ORAL | Status: DC | PRN

## 2011-02-26 MED ORDER — ONDANSETRON HCL 4 MG/2ML IJ SOLN: 4.0000 mg | Freq: Once | INTRAMUSCULAR | Status: DC | PRN

## 2011-02-26 MED ORDER — SODIUM CHLORIDE 0.9 % IJ SOLN: 9.0000 mL | INTRAMUSCULAR | Status: DC | PRN

## 2011-02-26 MED ORDER — KCL IN DEXTROSE-NACL 20-5-0.9 MEQ/L-%-% IV SOLN
INTRAVENOUS | Status: DC
  Administered 2011-02-26 – 2011-03-01 (×7): via INTRAVENOUS
  Administered 2011-03-02: 100 mL/h via INTRAVENOUS
  Administered 2011-03-02 – 2011-03-03 (×2): via INTRAVENOUS
  Filled 2011-02-26 (×15): qty 1000

## 2011-02-26 SURGICAL SUPPLY — 68 items
ADH SKN CLS APL DERMABOND .7 (GAUZE/BANDAGES/DRESSINGS)
CANISTER SUCTION 2500CC (MISCELLANEOUS) ×2 IMPLANT
CLIP TI MEDIUM 24 (CLIP) ×2 IMPLANT
CLIP TI WIDE RED SMALL 24 (CLIP) ×2 IMPLANT
CLOTH BEACON ORANGE TIMEOUT ST (SAFETY) ×2 IMPLANT
COVER MAYO STAND STRL (DRAPES) ×1 IMPLANT
COVER SURGICAL LIGHT HANDLE (MISCELLANEOUS) ×4 IMPLANT
DERMABOND ADVANCED (GAUZE/BANDAGES/DRESSINGS)
DERMABOND ADVANCED .7 DNX12 (GAUZE/BANDAGES/DRESSINGS) ×2 IMPLANT
DRAPE WARM FLUID 44X44 (DRAPE) ×2 IMPLANT
ELECT BLADE 4.0 EZ CLEAN MEGAD (MISCELLANEOUS) ×2
ELECT BLADE 6.5 EXT (BLADE) IMPLANT
ELECT CAUTERY BLADE 6.4 (BLADE) ×1 IMPLANT
ELECT REM PT RETURN 9FT ADLT (ELECTROSURGICAL) ×2
ELECTRODE BLDE 4.0 EZ CLN MEGD (MISCELLANEOUS) ×1 IMPLANT
ELECTRODE REM PT RTRN 9FT ADLT (ELECTROSURGICAL) ×1 IMPLANT
GLOVE BIO SURGEON STRL SZ7.5 (GLOVE) ×1 IMPLANT
GLOVE BIOGEL PI IND STRL 6.5 (GLOVE) IMPLANT
GLOVE BIOGEL PI IND STRL 7.0 (GLOVE) IMPLANT
GLOVE BIOGEL PI IND STRL 7.5 (GLOVE) ×1 IMPLANT
GLOVE BIOGEL PI INDICATOR 6.5 (GLOVE) ×3
GLOVE BIOGEL PI INDICATOR 7.0 (GLOVE) ×4
GLOVE BIOGEL PI INDICATOR 7.5 (GLOVE) ×2
GLOVE SS BIOGEL STRL SZ 6.5 (GLOVE) IMPLANT
GLOVE SUPERSENSE BIOGEL SZ 6.5 (GLOVE) ×5
GLOVE SURG SS PI 7.5 STRL IVOR (GLOVE) ×2 IMPLANT
GOWN PREVENTION PLUS XXLARGE (GOWN DISPOSABLE) ×2 IMPLANT
GOWN STRL NON-REIN LRG LVL3 (GOWN DISPOSABLE) ×10 IMPLANT
GRAFT HEMASHIELD 14X7MM (Vascular Products) ×1 IMPLANT
HEMOSTAT SNOW SURGICEL 2X4 (HEMOSTASIS) ×1 IMPLANT
HEMOSTAT SURGICEL 2X14 (HEMOSTASIS) IMPLANT
INSERT FOGARTY 61MM (MISCELLANEOUS) ×2 IMPLANT
INSERT FOGARTY SM (MISCELLANEOUS) ×4 IMPLANT
KIT BASIN OR (CUSTOM PROCEDURE TRAY) ×2 IMPLANT
KIT ROOM TURNOVER OR (KITS) ×2 IMPLANT
NS IRRIG 1000ML POUR BTL (IV SOLUTION) ×5 IMPLANT
PACK AORTA (CUSTOM PROCEDURE TRAY) ×2 IMPLANT
PAD ARMBOARD 7.5X6 YLW CONV (MISCELLANEOUS) ×4 IMPLANT
PENCIL BUTTON HOLSTER BLD 10FT (ELECTRODE) ×1 IMPLANT
SPECIMEN JAR MEDIUM (MISCELLANEOUS) ×1 IMPLANT
SUT ETHIBOND 5 LR DA (SUTURE) IMPLANT
SUT PDS AB 1 TP1 54 (SUTURE) ×4 IMPLANT
SUT PROLENE 3 0 SH 48 (SUTURE) ×8 IMPLANT
SUT PROLENE 3 0 SH DA (SUTURE) ×1 IMPLANT
SUT PROLENE 5 0 C 1 24 (SUTURE) ×3 IMPLANT
SUT PROLENE 5 0 C 1 36 (SUTURE) IMPLANT
SUT SILK 2 0 (SUTURE) ×2
SUT SILK 2 0 TIES 17X18 (SUTURE)
SUT SILK 2 0SH CR/8 30 (SUTURE) ×2 IMPLANT
SUT SILK 2-0 18XBRD TIE 12 (SUTURE) IMPLANT
SUT SILK 2-0 18XBRD TIE BLK (SUTURE) ×1 IMPLANT
SUT SILK 3 0 (SUTURE) ×2
SUT SILK 3 0 TIES 17X18 (SUTURE)
SUT SILK 3-0 18XBRD TIE 12 (SUTURE) IMPLANT
SUT SILK 3-0 18XBRD TIE BLK (SUTURE) ×1 IMPLANT
SUT SILK 4 0 (SUTURE) ×2
SUT SILK 4-0 18XBRD TIE 12 (SUTURE) IMPLANT
SUT VIC AB 2-0 CT1 27 (SUTURE) ×6
SUT VIC AB 2-0 CT1 36 (SUTURE) ×7 IMPLANT
SUT VIC AB 2-0 CT1 TAPERPNT 27 (SUTURE) IMPLANT
SUT VIC AB 3-0 SH 27 (SUTURE) ×6
SUT VIC AB 3-0 SH 27X BRD (SUTURE) ×4 IMPLANT
SUT VICRYL 4-0 PS2 18IN ABS (SUTURE) ×7 IMPLANT
TOWEL BLUE STERILE X RAY DET (MISCELLANEOUS) ×4 IMPLANT
TOWEL OR 17X24 6PK STRL BLUE (TOWEL DISPOSABLE) ×4 IMPLANT
TOWEL OR 17X26 10 PK STRL BLUE (TOWEL DISPOSABLE) ×4 IMPLANT
TRAY FOLEY CATH 14FRSI W/METER (CATHETERS) ×2 IMPLANT
WATER STERILE IRR 1000ML POUR (IV SOLUTION) ×3 IMPLANT

## 2011-02-26 NOTE — Op Note (Signed)
[facility]  Patient name: Kimberly [name] MRN: [id number] DOB: 1950/01/31 Sex: female  02/26/2011 Pre-operative Diagnosis: Bilateral lifestyle limiting claudication Post-operative diagnosis:  Same Surgeon:  Jorge Ny Assistants:  Cari Caraway, M.D. Procedure:   Aortobifemoral bypass graft with 14 x 7 bifurcated dacryon graft Anesthesia:  Gen. Blood Loss:  See anesthesia record Specimens:  Aortic plaque  Findings:  Proximal anastomosis was end to end.  Distal anastomosis was to the common femoral arteries bilaterally  Indications:  The patient has been seen and evaluated for bilateral lower extremity claudication. On imaging studies she was found to have an occluded left common iliac and a high-grade right iliac stenosis. She also has very small arteries by angiography. Due to the severity of her symptoms she wished to proceed with operative repair.  Procedure:  The patient was identified in the holding area and taken to [facility] OR ROOM 06  The patient was then placed supine on the table. general anesthesia was administered.  The patient was prepped and draped in the usual sterile fashion.  A time out was called and antibiotics were administered.  Oblique incisions were made in each groin. The common femoral artery was dissected out from the inguinal ligament distally. The visualized portions of the artery within this incision I could not identify the profunda as it was further distal. The crossing iliac vein was divided between 2-0 silk ties bilaterally. Once I was satisfied with exposure in the groin attention was turned toward the abdomen. A midline incision was made from the xiphoid to below the umbilicus. The fascia was divided with cautery. The abdominal cavity was entered sharply. The incision was then opened up throughout its length. The abdomen was inspected. There were no gross pathologies present. A Balfour was placed to aid with exposure followed by an  omni-Trac. The transverse colon was reflected cephalad. The small bowel was mobilized to the patient's right. The ligament of Treitz was taken down with a combination of cautery and sharp dissection. The inferior mesenteric vein was not divided. Once I was down to the anterior aortic wall I proceeded to mobilize the aorta from the renal arteries down to the bifurcation. The inferior mesenteric artery was encircled with a red vessel loop. Several large lumbar arteries required ligation with 2-0 silk ties. Once satisfied with exposure I created a tunnel between the 2 incisions. The tunnel was directly anterior to the iliac arteries bilaterally. It passed posterior to the ureter. Next the patient was fully heparinized. After the heparin circulated the infrarenal abdominal aorta was occluded with a Harken clamp and a Zanger clamp was placed at the level of the inferior mesenteric artery. The aorta was then divided with a 11 blade and curved Mayo scissors. I then oversewed the distal end of the aorta incorporating a felt strip. This was done in 2 layers with a 3-0 Prolene. Next attention was turned towards the proximal anastomosis. A 14 x 7 bifurcated dacryon graft was selected an end-to-end anastomosis was performed. I did this incorporating a felt strip. I placed 3 interrupted mattress sutures on the posterior wall I then ran the lateral 2 sutures around to the anterior portion of the graft. The limbs of the graft were occluded and the proximal clamp was released. The proximal anastomosis was hemostatic. Next each level the graft was brought through the previously created tunnels into the groin. Both anastomoses in the groin were performed to the common femoral artery. The femoral artery was  occluded proximally and distally vascular clamps an 11 blade was used to make an arteriotomy which was extended longitudinally with Potts scissors. And both groins the graft was cut to the appropriate length and about the size of  the arteriotomy. A running anastomosis was then created between the graft and the common femoral artery bilaterally with 5-0 Prolene. Dr.Dickson performed the left femoral anastomosis and I did the right. Punctal completion the appropriate flushing maneuvers were performed and blood flow was reestablished each leg sequentially. Doppler was used to evaluate the signals within the femoral arteries a low-fat multiphasic signals. At this point the patient heparin was reversed with 50 mg of protamine. Attention was again turned towards the abdomen. The abdomen was inspected hemostasis was achieved. Next the retroperitoneal tissue was reapproximated with a running 2-0 Vicryl. The small bowel was then evaluated throughout its length there were no areas of concern. It was placed back into its anatomic position as was the colon. The fascia was then closed with 2 running #1 PDS suture. Subcutaneous tissues closed with running 2-0 Vicryl the skin was closed with 4-0 Vicryl. The groins were then inspected and found to be hemostatic. The femoral sheath was reapproximated with 2-0 Vicryl subcutaneous tissue was closed with additional layers of 203 0 Vicryl the skin was closed with 4 Vicryl. Dermabond placed on the wound. The patient had palpable pedal pulses at the end of the case   Disposition:  To PACU in stable condition.   Juleen China, M.D. [facility] [place] Office: [phone] Pager:  [phone]

## 2011-02-26 NOTE — Anesthesia Postprocedure Evaluation (Signed)
  Anesthesia Post-op Note  Patient: Kimberly [name]  Procedure(s) Performed: Procedure(s) (LRB): AORTA BIFEMORAL BYPASS GRAFT (N/A)  Patient Location: PACU  Anesthesia Type: General  Level of Consciousness: awake and alert   Airway and Oxygen Therapy: Patient Spontanous Breathing and Patient connected to nasal cannula oxygen  Post-op Pain: mild  Post-op Assessment: Post-op Vital signs reviewed, Patient's Cardiovascular Status Stable, Respiratory Function Stable, Patent Airway, No signs of Nausea or vomiting and Pain level controlled  Post-op Vital Signs: Reviewed and stable  Complications: No apparent anesthesia complications

## 2011-02-26 NOTE — Preoperative (Signed)
Beta Blockers   Reason not to administer Beta Blockers:Not Applicable 

## 2011-02-26 NOTE — Transfer of Care (Signed)
Immediate Anesthesia Transfer of Care Note  Patient: Kimberly [name]  Procedure(s) Performed: Procedure(s) (LRB): AORTA BIFEMORAL BYPASS GRAFT (N/A)  Patient Location: PACU  Anesthesia Type: General  Level of Consciousness: awake, alert , oriented and patient cooperative  Airway & Oxygen Therapy: Patient Spontanous Breathing and Patient connected to nasal cannula oxygen  Post-op Assessment: Report given to PACU RN, Post -op Vital signs reviewed and stable and Patient moving all extremities X 4  Post vital signs: Reviewed and stable  Complications: No apparent anesthesia complications

## 2011-02-26 NOTE — Interval H&P Note (Signed)
History and Physical Interval Note:  02/26/2011 11:31 AM  Kimberly [name]  has presented today for surgery, with the diagnosis of Peripheral Vascular Disease  The various methods of treatment have been discussed with the patient and family. After consideration of risks, benefits and other options for treatment, the patient has consented to  Procedure(s) (LRB): AORTA BIFEMORAL BYPASS GRAFT (N/A) as a surgical intervention .  The patients' history has been reviewed, patient examined, no change in status, stable for surgery.  I have reviewed the patients' chart and labs.  Questions were answered to the patient's satisfaction.     Hiroki Wint IV, V. WELLS

## 2011-02-26 NOTE — Progress Notes (Signed)
Pharmacy Consult for Renal Adjustment of Antibiotics Ms Kimberly [name] is a 61 yo lady currently on zinacef for 2 doses post op aortobifemoral bypass graft.  Her SrCr is 0.63 and CrCl is > 90 ml/min.  No adjustment necessary at this time.  Pharmacy will sign off.  Please advise if we can be of further assistance. Talbert Cage, PharmD

## 2011-02-26 NOTE — Anesthesia Preprocedure Evaluation (Addendum)
Anesthesia Evaluation  Patient identified by MRN, date of birth, ID band Patient awake    Reviewed: Allergy & Precautions, H&P , NPO status , Patient's Chart, lab work & pertinent test results  History of Anesthesia Complications Negative for: history of anesthetic complications  Airway Mallampati: II TM Distance: >3 FB Neck ROM: Full    Dental  (+) Poor Dentition and Dental Advisory Given   Pulmonary shortness of breath and with exertion, asthma , Current Smoker,    + decreased breath sounds      Cardiovascular Exercise Tolerance: Poor hypertension, + Peripheral Vascular Disease and neg cardio ROS Regular Normal    Neuro/Psych PSYCHIATRIC DISORDERS Anxiety Negative Neurological ROS     GI/Hepatic negative GI ROS, Neg liver ROS,   Endo/Other  Negative Endocrine ROS  Renal/GU negative Renal ROS     Musculoskeletal negative musculoskeletal ROS (+)   Abdominal   Peds  Hematology negative hematology ROS (+) ASA   Anesthesia Other Findings   Reproductive/Obstetrics negative OB ROS                          Anesthesia Physical Anesthesia Plan  ASA: III  Anesthesia Plan: General ETT   Post-op Pain Management:    Induction: Intravenous  Airway Management Planned: Oral ETT  Additional Equipment: Arterial line and CVP  Intra-op Plan:   Post-operative Plan: Possible Post-op intubation/ventilation  Informed Consent: I have reviewed the patients History and Physical, chart, labs and discussed the procedure including the risks, benefits and alternatives for the proposed anesthesia with the patient or authorized representative who has indicated his/her understanding and acceptance.     Plan Discussed with: Anesthesiologist, CRNA and Surgeon  Anesthesia Plan Comments:         Anesthesia Quick Evaluation

## 2011-02-26 NOTE — H&P (View-Only) (Signed)
[facility] and [facility]in Specialist of [facility]   Patient name: Kimberly [name] MRN: [id number] DOB: June 10, 1950 Sex: female    No chief complaint on file.   HISTORY OF PRESENT ILLNESS: The patient comes in today to discuss her treatment options for her severe bilateral lifestyle limiting claudication. 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 She was kept overnight for a cardiac workup. She had a stress test which showed an ejection fraction of 75% without reversible defects. The patient denies any ulceration. She is very fatigued her walking is limited by pain. She states that this is making her depressed. She's medically managed for hypertension. She continues to be a smoker.  Past Medical History  Diagnosis Date  . Uterine cancer   . H/O: hysterectomy   . Asthma   . Hyperlipidemia   . History of lead poisoning 1977  . Shortness of breath   . Peripheral vascular disease     claudication  right greater than left  femoral artery    Past Surgical History  Procedure Date  . Abdominal hysterectomy   . Abdominal angiogram     History   Social History  . Marital Status: Legally Separated    Spouse Name: N/A    Number of Children: N/A  . Years of Education: N/A   Occupational History  . Not on file.   Social History Main Topics  . Smoking status: Current Everyday Smoker -- 0.5 packs/day for 45 years    Types: Cigarettes  . Smokeless tobacco: Never Used  . Alcohol Use: No  . Drug Use: No     History of Cocaine and marijuana abuse.  Marland Kitchen Sexually Active: Not Currently   Other Topics Concern  . Not on file   Social History Narrative  . No narrative on file    Family History  Problem Relation Age of Onset  . Cancer Mother     Allergies as of 02/18/2011  . (No Known Allergies)    Current Outpatient Prescriptions on File Prior to Visit  Medication Sig  Dispense Refill  . aspirin EC 81 MG tablet Take 81 mg by mouth daily.      . fish oil-omega-3 fatty acids 1000 MG capsule Take 2 g by mouth daily.       Marland Kitchen loratadine (CLARITIN) 5 MG/5ML syrup Take 10 mg by mouth daily as needed. For allergies      . Multiple Vitamin (MULITIVITAMIN WITH MINERALS) TABS Take 1 tablet by mouth daily.         REVIEW OF SYSTEMS: No changes from prior visit  PHYSICAL EXAMINATION:   Vital signs are BP 128/48  Pulse 71  Resp 16  Ht 5\' 4"  (1.626 m)  Wt 135 lb (61.236 kg)  BMI 23.17 kg/m2  SpO2 98% General: The patient appears their stated age. HEENT:  No gross abnormalities Pulmonary:  Non labored breathing Abdomen: Soft and non-tender no surgical incisions except from all removal Musculoskeletal: There are no major deformities. Neurologic: No focal weakness or paresthesias are detected, Skin: There are no ulcer or rashes noted. Psychiatric: The patient has normal affect. Cardiovascular: There is a regular rate and rhythmmurmur appreciated.   Diagnostic Studies Carotid duplex: 60-79% stenosis bilaterally ABI on the right is 0.5 date of the left is 0.5 to CT scan shows aortoiliac occlusive disease with no outflow runoff disease  Assessment: Bilateral claudication Plan: The  patient will be scheduled for aortobifemoral bypass graft on Tuesday, February 19. The risks and benefits were discussed with the patient including the risk of death, heart attack, stroke, infection, graft failure, intestinal ischemia, arterial ischemia. All of her questions were answered today. She is continuing to get assistance from social work regarding her financial issues.  Jorge Ny, M.D. [facility] of [place] Office: [phone] Pager:  [phone]

## 2011-02-27 ENCOUNTER — Inpatient Hospital Stay (HOSPITAL_COMMUNITY): Payer: Medicaid Other

## 2011-02-27 ENCOUNTER — Encounter (HOSPITAL_COMMUNITY): Payer: Self-pay | Admitting: Surgery

## 2011-02-27 LAB — CBC
HCT: 31.6 % — ABNORMAL LOW (ref 36.0–46.0)
Hemoglobin: 10.9 g/dL — ABNORMAL LOW (ref 12.0–15.0)
MCH: 34.1 pg — ABNORMAL HIGH (ref 26.0–34.0)
MCHC: 34.5 g/dL (ref 30.0–36.0)
MCV: 98.8 fL (ref 78.0–100.0)
Platelets: 166 10*3/uL (ref 150–400)
RBC: 3.2 MIL/uL — ABNORMAL LOW (ref 3.87–5.11)
RDW: 13 % (ref 11.5–15.5)
WBC: 14.5 10*3/uL — ABNORMAL HIGH (ref 4.0–10.5)

## 2011-02-27 LAB — COMPREHENSIVE METABOLIC PANEL
ALT: 14 U/L (ref 0–35)
AST: 17 U/L (ref 0–37)
Albumin: 2.1 g/dL — ABNORMAL LOW (ref 3.5–5.2)
Alkaline Phosphatase: 46 U/L (ref 39–117)
BUN: 13 mg/dL (ref 6–23)
CO2: 27 mEq/L (ref 19–32)
Calcium: 7.9 mg/dL — ABNORMAL LOW (ref 8.4–10.5)
Chloride: 105 mEq/L (ref 96–112)
Creatinine, Ser: 0.64 mg/dL (ref 0.50–1.10)
GFR calc Af Amer: >90 mL/min (ref >90)
GFR calc non Af Amer: >90 mL/min (ref >90)
Glucose, Bld: 175 mg/dL — ABNORMAL HIGH (ref 70–99)
Potassium: 4.1 mEq/L (ref 3.5–5.1)
Sodium: 136 mEq/L (ref 135–145)
Total Bilirubin: 0.5 mg/dL (ref 0.3–1.2)
Total Protein: 4.5 g/dL — ABNORMAL LOW (ref 6.0–8.3)

## 2011-02-27 LAB — POCT I-STAT 3, ART BLOOD GAS (G3+)
Bicarbonate: 26.2 mEq/L — ABNORMAL HIGH (ref 20.0–24.0)
O2 Saturation: 95 %
Patient temperature: 98.9
TCO2: 28 mmol/L (ref 0–100)
pCO2 arterial: 51 mmHg — ABNORMAL HIGH (ref 35.0–45.0)
pH, Arterial: 7.32 — ABNORMAL LOW (ref 7.350–7.400)
pO2, Arterial: 82 mmHg (ref 80.0–100.0)

## 2011-02-27 LAB — AMYLASE: Amylase: 139 U/L — ABNORMAL HIGH (ref 0–105)

## 2011-02-27 LAB — MAGNESIUM: Magnesium: 1.6 mg/dL (ref 1.5–2.5)

## 2011-02-27 MED ORDER — KETOROLAC TROMETHAMINE 30 MG/ML IJ SOLN
30.0000 mg | Freq: Four times a day (QID) | INTRAMUSCULAR | Status: AC
  Administered 2011-02-27 – 2011-03-02 (×9): 30 mg via INTRAVENOUS
  Filled 2011-02-27 (×11): qty 1

## 2011-02-27 MED ORDER — KETOROLAC TROMETHAMINE 30 MG/ML IJ SOLN
INTRAMUSCULAR | Status: AC
  Administered 2011-02-27: 30 mg
  Filled 2011-02-27: qty 1

## 2011-02-27 MED ORDER — MORPHINE SULFATE (PF) 1 MG/ML IV SOLN
INTRAVENOUS | Status: AC
  Administered 2011-02-27: 7.37 mg
  Filled 2011-02-27: qty 25

## 2011-02-27 MED ORDER — MORPHINE SULFATE (PF) 1 MG/ML IV SOLN
INTRAVENOUS | Status: AC
  Administered 2011-02-27: 21:00:00
  Filled 2011-02-27: qty 25

## 2011-02-27 NOTE — Progress Notes (Addendum)
[facility] Progress Note  02/27/2011 7:31 AM POD 1  Subjective:  C/o muscle spasms this am  Tm 99.2 now 98.9 97% 2LO2NC  90s-160s sys HR 80s-100s reg Filed Vitals:   02/27/11 0700  BP: 120/49  Pulse: 102  Temp:   Resp: 16     Physical Exam: Cardiac:  RRR but tachy Lungs:  CTAB Abdomen:  Soft, slightly tender; ND; -BS Incisions:  C/d/i Extremities:  2+ DP pulses bilaterally.  Both feet are warm and well perfused.  CBC    Component Value Date/Time   WBC 14.5* 02/27/2011 0350   RBC 3.20* 02/27/2011 0350   HGB 10.9* 02/27/2011 0350   HCT 31.6* 02/27/2011 0350   PLT 166 02/27/2011 0350   MCV 98.8 02/27/2011 0350   MCH 34.1* 02/27/2011 0350   MCHC 34.5 02/27/2011 0350   RDW 13.0 02/27/2011 0350   LYMPHSABS 2.8 02/25/2011 1534   MONOABS 0.6 02/25/2011 1534   EOSABS 0.1 02/25/2011 1534   BASOSABS 0.0 02/25/2011 1534    BMET    Component Value Date/Time   NA 136 02/27/2011 0350   K 4.1 02/27/2011 0350   CL 105 02/27/2011 0350   CO2 27 02/27/2011 0350   GLUCOSE 175* 02/27/2011 0350   BUN 13 02/27/2011 0350   CREATININE 0.64 02/27/2011 0350   CALCIUM 7.9* 02/27/2011 0350   GFRNONAA >90 02/27/2011 0350   GFRAA >90 02/27/2011 0350    INR    Component Value Date/Time   INR 1.08 02/26/2011 1558     Intake/Output Summary (Last 24 hours) at 02/27/11 0731 Last data filed at 02/27/11 0700  Gross per 24 hour  Intake 5428.64 ml  Output   1040 ml  Net 4388.64 ml   NGT output: 25cc  Assessment/Plan:  61 y.o. female is s/p Aortobifemoral bypass graft   POD 1 -WBC up this am--probably post surgical. Will continue to monitor. -NGT with minimal output. -OOB to chair today.  Mobilize -acute surgical blood loss anemia--tolerating.  Continue to monitor.   Newton Pigg, PA-C [facility] [phone] 02/27/2011 7:31 AM   Having trouble with muscle spasm and pain.  Will try toradol Keep i ICU

## 2011-02-27 NOTE — Progress Notes (Signed)
OT/PT Cancellation Note  Treatment cancelled today due to patient with increased surgical pain limiting ability/desire to participate with therapy. RN requesting therapy hold at this time. Will check back as schedule allows. Thank you  Kimberly [name], OTR/L Pager: [phone] 02/27/2011

## 2011-02-27 NOTE — Progress Notes (Signed)
Ivonne Andrew PT, DPT [phone]

## 2011-02-27 NOTE — Progress Notes (Signed)
Utilization review completed. Aldyn Toon, RN, BSN. 02/27/11 

## 2011-02-28 LAB — TYPE AND SCREEN
ABO/RH(D): O POS
Antibody Screen: NEGATIVE
Unit division: 0
Unit division: 0

## 2011-02-28 LAB — CBC
HCT: 30.4 % — ABNORMAL LOW (ref 36.0–46.0)
Hemoglobin: 10.3 g/dL — ABNORMAL LOW (ref 12.0–15.0)
MCH: 33.8 pg (ref 26.0–34.0)
MCHC: 33.9 g/dL (ref 30.0–36.0)
MCV: 99.7 fL (ref 78.0–100.0)
Platelets: 165 10*3/uL (ref 150–400)
RBC: 3.05 MIL/uL — ABNORMAL LOW (ref 3.87–5.11)
RDW: 13.3 % (ref 11.5–15.5)
WBC: 15.6 10*3/uL — ABNORMAL HIGH (ref 4.0–10.5)

## 2011-02-28 LAB — BASIC METABOLIC PANEL
BUN: 13 mg/dL (ref 6–23)
CO2: 27 mEq/L (ref 19–32)
Calcium: 8.3 mg/dL — ABNORMAL LOW (ref 8.4–10.5)
Chloride: 107 mEq/L (ref 96–112)
Creatinine, Ser: 0.62 mg/dL (ref 0.50–1.10)
GFR calc Af Amer: >90 mL/min (ref >90)
GFR calc non Af Amer: >90 mL/min (ref >90)
Glucose, Bld: 157 mg/dL — ABNORMAL HIGH (ref 70–99)
Potassium: 3.8 mEq/L (ref 3.5–5.1)
Sodium: 137 mEq/L (ref 135–145)

## 2011-02-28 MED ORDER — BISACODYL 10 MG RE SUPP
10.0000 mg | Freq: Every day | RECTAL | Status: DC | PRN
  Filled 2011-02-28: qty 1

## 2011-02-28 NOTE — Progress Notes (Signed)
PT Cancellation Note  Treatment cancelled today due to patient's refusal to participate. Pt reports she doesn't need to walk and has all what she needs set up at home. I asked her when I could come back today and she reported she didn't want to get up later today. After explanation of the effects of prolonged bed rest pt agreeable to me coming back at 2 pm today. Will re-attempt later today although pt reports she cannot promise she will want to work with me.    [facility] HELEN 02/28/2011, 11:47 AM [phone]

## 2011-02-28 NOTE — Evaluation (Signed)
Occupational Therapy Evaluation Patient Details Name: Kimberly [name] MRN: [id number] DOB: 1950/06/22 Today's Date: 02/28/2011  Problem List:  Patient Active Problem List  Diagnoses  . TOBACCO ABUSE  . DENTAL PAIN  . MENOPAUSE, SURGICAL  . LYMPHADENOPATHY  . HYPERCHOLESTEROLEMIA  . INTERMITTENT CLAUDICATION  . CYSTITIS, ACUTE  . PAP SMEAR, LGSIL, ABNORMAL  . Subclavian artery stenosis  . PVD (peripheral vascular disease) with claudication    Past Medical History:  Past Medical History  Diagnosis Date  . Uterine cancer   . H/O: hysterectomy   . Asthma   . Hyperlipidemia   . History of lead poisoning 1977  . Shortness of breath   . Peripheral vascular disease     claudication  right greater than left  femoral artery  . Anxiety    Past Surgical History:  Past Surgical History  Procedure Date  . Abdominal hysterectomy   . Abdominal angiogram   . Aorta - bilateral femoral artery bypass graft 02/26/2011    Procedure: AORTA BIFEMORAL BYPASS GRAFT;  Surgeon: Juleen China, MD;  Location: [facility] OR;  Service: Vascular;  Laterality: N/A;    OT Assessment/Plan/Recommendation OT Assessment Clinical Impression Statement: Pt. admitted for aortobifemoral bypass graft and presents with the below problem list compounded by lethargy secondary to increased pain med usage. Will benefit from skilled OT in the acute setting to maximize I with ADL and ADL mobility to S-Min A level upon d/c OT Recommendation/Assessment: Patient will need skilled OT in the acute care venue OT Problem List: Decreased strength;Decreased activity tolerance;Impaired balance (sitting and/or standing);Decreased knowledge of use of DME or AE;Decreased knowledge of precautions;Cardiopulmonary status limiting activity;Pain OT Therapy Diagnosis : Generalized weakness;Acute pain OT Plan OT Frequency: Min 1X/week OT Treatment/Interventions: Self-care/ADL training;Therapeutic exercise;Energy conservation;DME and/or AE  instruction;Therapeutic activities;Patient/family education;Balance training OT Recommendation Follow Up Recommendations: Skilled nursing facility (or [facility] with 24hr assist) Equipment Recommended: Defer to next venue Individuals Consulted Consulted and Agree with Results and Recommendations: Patient unable/family or caregiver not available OT Goals Acute Rehab OT Goals OT Goal Formulation: With patient Time For Goal Achievement: 2 weeks ADL Goals Pt Will Perform Grooming: with supervision;Standing at sink ADL Goal: Grooming - Progress: Goal set today Pt Will Perform Upper Body Dressing: with supervision;with set-up;Sitting, bed ADL Goal: Upper Body Dressing - Progress: Goal set today Pt Will Perform Lower Body Dressing: with min assist;Sit to stand from bed ADL Goal: Lower Body Dressing - Progress: Goal set today Pt Will Transfer to Toilet: with supervision;3-in-1 ADL Goal: Toilet Transfer - Progress: Goal set today Pt Will Perform Toileting - Hygiene: with supervision;Sitting on 3-in-1 or toilet ADL Goal: Toileting - Hygiene - Progress: Goal set today  OT Evaluation Precautions/Restrictions  Precautions Precautions: Fall Restrictions Weight Bearing Restrictions: No Prior Functioning Home Living Lives With:  (two grandchildren (4 and 9y/o)) Type of Home: House Home Layout: One level Home Access: Level entry Bathroom Shower/Tub: Engineer, manufacturing systems: Standard Home Adaptive Equipment: None Additional Comments: Pt. states the children are being watched by "their mother, grandfather and friend" right now. 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 Where Assessed - Grooming: Sitting, chair Upper Body Bathing: Simulated;Moderate assistance Where Assessed - Upper Body Bathing: Sitting, chair Lower Body Bathing: Simulated;+1 Total assistance Where Assessed - Lower Body Bathing: Sit to stand from bed Upper Body Dressing: Simulated;Moderate assistance Where Assessed - Upper Body Dressing: Sitting, chair Lower Body Dressing: Simulated;+1 Total assistance Lower Body Dressing Details (indicate cue type and reason): limited by lethargy and abdominal pain Where Assessed - Lower Body Dressing: Sit to stand from bed Toilet Transfer: Simulated;Moderate assistance Toilet Transfer Details (indicate cue type and reason): VC for hand placement and assist to maintain upright standing as pt with tendency to bend due to pain Toilet Transfer Method: Stand pivot Toileting - Clothing Manipulation: Not assessed Toileting - Hygiene: Not assessed Tub/Shower Transfer: Not assessed Ambulation Related to ADLs: NT this session ADL Comments: Pt. largely limited by pain and lethargy.  Pt. With multiple secretions upon sitting- instructed pt to use suction. O2 sats decreased down to 85% on 3L and increased to 90-91% with coughing up of secretions. RN informed. Vision/Perception  Vision - History Patient Visual Report: No change from baseline Cognition Cognition Orientation Level: Oriented X4 Sensation/Coordination Coordination Gross Motor Movements are Fluid and Coordinated: Yes Fine Motor Movements are Fluid and Coordinated: Yes Extremity Assessment RUE Assessment RUE Assessment: Within Functional Limits (Although, easily fatigued) LUE Assessment LUE Assessment: Within Functional Limits  (Although, easily fatigued) Mobility  Bed Mobility Supine to Sit: 3: Mod assist;HOB elevated (Comment degrees) (50degrees) End of Session OT - End of Session Equipment Utilized During Treatment: Gait belt Activity Tolerance: Patient limited by pain;Patient limited by fatigue Patient left: in chair;with call bell in reach (NT in room) Nurse Communication: Mobility status for transfers General Behavior During Session: Lethargic   Israel Wunder 02/28/2011, 10:09 AM

## 2011-02-28 NOTE — Progress Notes (Addendum)
[facility]ASC[facility]IN SPECIALISTS OF [place]  Post-op  Intra-abdominal Surgery note  Date of Surgery: 02/26/2011 Surgeon: Surgeon(s): Seth Bake Durene Cal, MD Chuck Hint, MD POD: 2 Days Post-Op Procedure(s): AORTA BIFEMORAL BYPASS GRAFT  History of Present Illness  Kimberly [name] is a 61 y.o. female who is  up s/p Procedure(s): AORTA BIFEMORAL BYPASS GRAFT Pt is doing well. complains of incisional pain; denies nausea/vomiting; denies diarrhea. has not had flatus;has not had BM  IMAGING: Dg Chest Portable 1 View  02/27/2011  *RADIOLOGY REPORT*  Clinical Data: Postop aorto bifemoral surgery  PORTABLE CHEST - 1 VIEW  Comparison: Portable chest x-ray of 02/26/2011  Findings: There has been some improvement in pulmonary vascular congestion with mild basilar atelectasis remaining.  The heart is within normal limits in size.  A venous sheath remains in the SVC and an NG tube is present.  Heart size is stable.  IMPRESSION: Some improvement in pulmonary vascular congestion.  Original Report Authenticated By: Juline Patch, M.D.   Dg Chest Portable 1 View  02/26/2011  *RADIOLOGY REPORT*  Clinical Data: 61 year old female status post central line placement.  PORTABLE CHEST - 1 VIEW  Comparison: 02/25/2011 and earlier.  Findings: AP portable semi upright view 1550 hours.  Right IJ introducer sheath in place, tip at the level of the right clavicular head.  Enteric tube also is in placed, side hole at the level of the gastric body.  Lower lung volumes.  Increased crowding of markings and vascular congestion.  No definite effusion.  No pneumothorax or consolidation.  Stable cardiac size and mediastinal contours.  IMPRESSION: 1.  Right IJ introducer sheath placed.  Tip at the level of the right clavicular head.  No pneumothorax. 2.  Enteric tube courses to the abdomen, 5 volts the level of the stomach. 3.  Lower lung volumes with increased vascular congestion and atelectasis.  Original Report Authenticated  By: Harley Hallmark, M.D.    Significant Diagnostic Studies: CBC Lab Results  Component Value Date   WBC 15.6* 02/28/2011   HGB 10.3* 02/28/2011   HCT 30.4* 02/28/2011   MCV 99.7 02/28/2011   PLT 165 02/28/2011    BMET    Component Value Date/Time   NA 137 02/28/2011 0500   K 3.8 02/28/2011 0500   CL 107 02/28/2011 0500   CO2 27 02/28/2011 0500   GLUCOSE 157* 02/28/2011 0500   BUN 13 02/28/2011 0500   CREATININE 0.62 02/28/2011 0500   CALCIUM 8.3* 02/28/2011 0500   GFRNONAA >90 02/28/2011 0500   GFRAA >90 02/28/2011 0500   COAG Lab Results  Component Value Date   INR 1.08 02/26/2011   INR 0.95 02/25/2011   No results found for this basename: PTT    I/O last 3 completed shifts: In: 4267.4 [I.V.:3617.4; NG/GT:450; IV Piggyback:200] Out: 1860 [Urine:1535; Emesis/NG output:325]    Physical Examination BP Readings from Last 3 Encounters:  02/28/11 114/92  02/28/11 114/92  02/25/11 127/71   Temp Readings from Last 3 Encounters:  02/28/11 98.5 F (36.9 C) Oral  02/28/11 98.5 F (36.9 C) Oral  02/25/11 97 F (36.1 C)    SpO2 Readings from Last 3 Encounters:  02/28/11 92%  02/28/11 92%  02/25/11 97%   Pulse Readings from Last 3 Encounters:  02/28/11 115  02/28/11 115  02/25/11 83    General: A&O x 3, WDWN female in NAD Pulmonary: normal non-labored breathing , without Rales, rhonchi,   With inspiritory wheezing Cardiac: Heart rate : regular ,  Abdomen:abdomen soft and non-tender  Abdominal wound:clean, dry, intact  Neurologic: A&O X 3; Appropriate Affect ; SENSATION: normal; MOTOR FUNCTION:  moving all extremities equally. Speech is fluent/normal  Vascular Exam:BLE warm and well perfused Extremities without ischemic changes, no Gangrene , no cellulitis; no open wounds;   LOWER EXTREMITY PULSES           RIGHT                                      LEFT      POSTERIOR TIBIAL palpable palpable       DORSALIS PEDIS      ANTERIOR TIBIAL palpable palpable     Non-Invasive Vascular Imaging ABI'S: Pending  Assessment/Plan: Kimberly [name] is a 61 y.o. female who is 2 Days Post-Op Procedure(s): AORTA BIFEMORAL BYPASS GRAFT Pain better controlled Strict I/O - will leave foley in today DC NGT Transfer to 3300 Dulcolax supp today Ice chips only ambulate   Kimberly [name],Kimberly [name] [phone] 02/28/2011 8:07 AM  Agree, Looks much better today Pain significantly improved Needs OOB and ambulation NG d/c, keep NPO  [place]ham

## 2011-02-28 NOTE — Evaluation (Signed)
Physical Therapy Evaluation Patient Details Name: MYEASHA BALLOWE MRN: [id number] DOB: Dec 16, 1950 Today's Date: 02/28/2011  Problem List:  Patient Active Problem List  Diagnoses  . TOBACCO ABUSE  . DENTAL PAIN  . MENOPAUSE, SURGICAL  . LYMPHADENOPATHY  . HYPERCHOLESTEROLEMIA  . INTERMITTENT CLAUDICATION  . CYSTITIS, ACUTE  . PAP SMEAR, LGSIL, ABNORMAL  . Subclavian artery stenosis  . PVD (peripheral vascular disease) with claudication    Past Medical History:  Past Medical History  Diagnosis Date  . Uterine cancer   . H/O: hysterectomy   . Asthma   . Hyperlipidemia   . History of lead poisoning 1977  . Shortness of breath   . Peripheral vascular disease     claudication  right greater than left  femoral artery  . Anxiety    Past Surgical History:  Past Surgical History  Procedure Date  . Abdominal hysterectomy   . Abdominal angiogram   . Aorta - bilateral femoral artery bypass graft 02/26/2011    Procedure: AORTA BIFEMORAL BYPASS GRAFT;  Surgeon: Juleen China, MD;  Location: [facility] OR;  Service: Vascular;  Laterality: N/A;    PT Assessment/Plan/Recommendation PT Assessment Clinical Impression Statement: Pt. admitted for aortobifemoral bypass graft and presents with the below problem list compounded by lethargy secondary to increased pain med usage. Will benefit physical therapy in the acute setting to address the below problem list as well as to maximize mobility and independence so as to increase safety at home and decrease burden of care. Rec HHPT (if 24 hour support available) or SNF if 24 hour support not available.  PT Recommendation/Assessment: Patient will need skilled PT in the acute care venue PT Problem List: Decreased strength;Decreased activity tolerance;Decreased mobility;Decreased safety awareness;Cardiopulmonary status limiting activity;Decreased cognition;Pain;Decreased knowledge of use of DME Barriers to Discharge: Decreased caregiver support PT Therapy  Diagnosis : Difficulty walking;Abnormality of gait;Generalized weakness;Acute pain PT Plan PT Frequency: Min 3X/week PT Treatment/Interventions: DME instruction;Gait training;Functional mobility training;Therapeutic activities;Therapeutic exercise;Balance training;Neuromuscular re-education;Cognitive remediation;Patient/family education PT Recommendation Follow Up Recommendations: Home health PT;Supervision/Assistance - 24 hour vs Skilled nursing facility (if 24 hour support not available rec SNF) Equipment Recommended: Defer to next venue PT Goals  Acute Rehab PT Goals PT Goal Formulation: With patient Time For Goal Achievement: 2 weeks Pt will Roll Supine to Right Side: with modified independence PT Goal: Rolling Supine to Right Side - Progress: Goal set today Pt will Roll Supine to Left Side: with modified independence PT Goal: Rolling Supine to Left Side - Progress: Goal set today Pt will go Supine/Side to Sit: with modified independence PT Goal: Supine/Side to Sit - Progress: Goal set today Pt will go Sit to Supine/Side: with modified independence PT Goal: Sit to Supine/Side - Progress: Goal set today Pt will go Sit to Stand: with modified independence PT Goal: Sit to Stand - Progress: Goal set today Pt will go Stand to Sit: with modified independence PT Goal: Stand to Sit - Progress: Goal set today Pt will Ambulate: >150 feet;with modified independence;with least restrictive assistive device PT Goal: Ambulate - Progress: Goal set today Pt will Perform Home Exercise Program: Independently PT Goal: Perform Home Exercise Program - Progress: Goal set today  PT Evaluation Precautions/Restrictions  Precautions Precautions: Fall Prior Functioning  Home Living Lives With:  (takes care of 2 grandchildren 60 and 71 y/o) Receives Help From: Family (reports other grandmother can help with children) Type of Home: House Home Layout: One level Home Access: Level entry Bathroom Shower/Tub:  Tub/shower unit [employer]  Toilet: Standard Home Adaptive Equipment: None Additional Comments: "mother, grandfather and friend" right now are taking care of the grandchildren. When asked if 24 hour assist available, pt couldn't answer Prior Function Level of Independence: Independent with basic ADLs;Independent with gait (but reports falling at home) Driving: Yes Vocation: Part time employment Cognition Cognition Arousal/Alertness: Lethargic Overall Cognitive Status: Difficult to assess (slower processing likely secondary to PCA (morphine)) Difficult to assess due to: level of arousal Orientation Level: Oriented to person;Oriented to situation;Oriented to place Sensation/Coordination Sensation Light Touch: Appears Intact Coordination Gross Motor Movements are Fluid and Coordinated: Yes Fine Motor Movements are Fluid and Coordinated: Yes Extremity Assessment RUE Assessment RUE Assessment:  (see OT note) LUE Assessment LUE Assessment:  (see OT note) RLE Assessment RLE Assessment:  (generalized weakness with quick fatigue) LLE Assessment LLE Assessment:  (generalized weakness with quick fatigue) Mobility (including Balance) Bed Mobility Bed Mobility: No Transfers Transfers: Yes Sit to Stand: 4: Min assist;From chair/3-in-1;With upper extremity assist Sit to Stand Details (indicate cue type and reason): cues for hand placement and assist for follow through to stand; pt remains flexed in her trunk secondary to pain  Stand to Sit: 4: Min assist;To chair/3-in-1;With upper extremity assist;With armrests Stand to Sit Details: assist to control descent; cues for hand placement Ambulation/Gait Ambulation/Gait: Yes Ambulation/Gait Assistance: 4: Min assist Ambulation/Gait Assistance Details (indicate cue type and reason): assist to steady pt; cues for upright posture and forward gaze; slow and shuffled short steps; limited by dizziness and fatigue Ambulation Distance (Feet): 4  Feet Assistive device: Rolling walker  Balance Balance Assessed: No Exercise    End of Session PT - End of Session Equipment Utilized During Treatment: Gait belt Activity Tolerance: Patient limited by fatigue;Patient limited by pain; limited by dizziness (RN aware) Patient left: in chair;with call bell in reach Nurse Communication: Mobility status for transfers;Mobility status for ambulation General Behavior During Session: Lethargic Cognition: Impaired  WHITLOW,Aleph Nickson HELEN 02/28/2011, 2:21 PM

## 2011-03-01 MED ORDER — MORPHINE SULFATE (PF) 1 MG/ML IV SOLN
INTRAVENOUS | Status: AC
  Filled 2011-03-01: qty 25

## 2011-03-01 MED ORDER — MORPHINE SULFATE (PF) 1 MG/ML IV SOLN
INTRAVENOUS | Status: AC
  Administered 2011-03-02: 3.92 mg via INTRAVENOUS
  Filled 2011-03-01: qty 25

## 2011-03-01 NOTE — Progress Notes (Signed)
OT/PT Cancellation Note  Treatment cancelled today due to patient's refusal to participate: pt states, "I already walked today, and I'm just not doing anything again. That was enough." Unable to persuade pt otherwise.  Glendale Chard, OTR/L Pager: [phone] 03/01/2011

## 2011-03-01 NOTE — Progress Notes (Signed)
Agree with note above.  [phone]

## 2011-03-01 NOTE — Progress Notes (Signed)
[facility] Progress Note  03/01/2011 8:34 AM POD 3  Subjective:  Muscle spasms better   Filed Vitals:   03/01/11 0751  BP:   Pulse:   Temp: 97.2 F (36.2 C)  Resp:      Physical Exam: Cardiac:  RRR Lungs:  Non-labored Abdomen:  Soft; +BS +flatus Incisions:  C/d/i without drainage Extremities:  BLE warm  CBC    Component Value Date/Time   WBC 15.6* 02/28/2011 0500   RBC 3.05* 02/28/2011 0500   HGB 10.3* 02/28/2011 0500   HCT 30.4* 02/28/2011 0500   PLT 165 02/28/2011 0500   MCV 99.7 02/28/2011 0500   MCH 33.8 02/28/2011 0500   MCHC 33.9 02/28/2011 0500   RDW 13.3 02/28/2011 0500   LYMPHSABS 2.8 02/25/2011 1534   MONOABS 0.6 02/25/2011 1534   EOSABS 0.1 02/25/2011 1534   BASOSABS 0.0 02/25/2011 1534    BMET    Component Value Date/Time   NA 137 02/28/2011 0500   K 3.8 02/28/2011 0500   CL 107 02/28/2011 0500   CO2 27 02/28/2011 0500   GLUCOSE 157* 02/28/2011 0500   BUN 13 02/28/2011 0500   CREATININE 0.62 02/28/2011 0500   CALCIUM 8.3* 02/28/2011 0500   GFRNONAA >90 02/28/2011 0500   GFRAA >90 02/28/2011 0500    INR    Component Value Date/Time   INR 1.08 02/26/2011 1558     Intake/Output Summary (Last 24 hours) at 03/01/11 9604 Last data filed at 03/01/11 0700  Gross per 24 hour  Intake 2313.92 ml  Output    535 ml  Net 1778.92 ml     Assessment/Plan:  61 y.o. female is s/p AORTA BIFEMORAL BYPASS GRAFT  POD 3 -improved this am. -transfer to 2000 -advance diet to clear liquids -continue PT/OT/mobilization -check labs in am   Newton Pigg, PA-C [facility] [phone] 03/01/2011 8:34 AM

## 2011-03-02 LAB — BASIC METABOLIC PANEL
BUN: 14 mg/dL (ref 6–23)
CO2: 25 mEq/L (ref 19–32)
Calcium: 8.1 mg/dL — ABNORMAL LOW (ref 8.4–10.5)
Chloride: 112 mEq/L (ref 96–112)
Creatinine, Ser: 0.54 mg/dL (ref 0.50–1.10)
GFR calc Af Amer: >90 mL/min (ref >90)
GFR calc non Af Amer: >90 mL/min (ref >90)
Glucose, Bld: 98 mg/dL (ref 70–99)
Potassium: 3.6 mEq/L (ref 3.5–5.1)
Sodium: 143 mEq/L (ref 135–145)

## 2011-03-02 LAB — CBC
HCT: 28.7 % — ABNORMAL LOW (ref 36.0–46.0)
Hemoglobin: 9.6 g/dL — ABNORMAL LOW (ref 12.0–15.0)
MCH: 33 pg (ref 26.0–34.0)
MCHC: 33.4 g/dL (ref 30.0–36.0)
MCV: 98.6 fL (ref 78.0–100.0)
Platelets: 170 10*3/uL (ref 150–400)
RBC: 2.91 MIL/uL — ABNORMAL LOW (ref 3.87–5.11)
RDW: 13 % (ref 11.5–15.5)
WBC: 9.1 10*3/uL (ref 4.0–10.5)

## 2011-03-02 MED ORDER — MORPHINE SULFATE (PF) 1 MG/ML IV SOLN
INTRAVENOUS | Status: AC
  Filled 2011-03-02: qty 25

## 2011-03-02 NOTE — Progress Notes (Signed)
VASCULAR PROGRESS NOTE  SUBJECTIVE: Complains of anorexia. No nausea or vomiting. Did ambulate yesterday.  PHYSICAL EXAM: Filed Vitals:   03/01/11 2200 03/02/11 0000 03/02/11 0008 03/02/11 0400  BP: 120/53 145/71  150/63  Pulse: 93 90  87  Temp:   97.1 F (36.2 C) 98.5 F (36.9 C)  TempSrc:   Oral Oral  Resp: 20 18  18   Height:      Weight:      SpO2: 94% 94%  96%   Lungs: clear to auscultation Incisions all look fine. Feet warm and well perfused.  LABS: Lab Results  Component Value Date   WBC 9.1 03/02/2011   HGB 9.6* 03/02/2011   HCT 28.7* 03/02/2011   MCV 98.6 03/02/2011   PLT 170 03/02/2011   Lab Results  Component Value Date   CREATININE 0.54 03/02/2011    ASSESSMENT/PLAN: 1. 4 Days Post-Op s/p: aorto bifemoral bypass graft. 2. Transferred to 2000. 3. DC Foley. 4. Progressive diet as tolerated. Currently she does not wish anything more than clear liquids.  Waverly Ferrari, MD, FACS Beeper: [phone] 03/02/2011

## 2011-03-03 MED ORDER — MORPHINE SULFATE (PF) 1 MG/ML IV SOLN
INTRAVENOUS | Status: AC
  Filled 2011-03-03: qty 25

## 2011-03-03 MED ORDER — BISACODYL 10 MG RE SUPP
10.0000 mg | Freq: Once | RECTAL | Status: AC
  Administered 2011-03-03: 10 mg via RECTAL

## 2011-03-03 NOTE — Progress Notes (Addendum)
[facility] Progress Note  03/03/2011 8:56 AM POD 5  Subjective:  Aggravated with the beeping of the IV.  States that her stomach hurts and she hasn't had a BM.  Afebrile  96%2LO2NC Filed Vitals:   03/03/11 0809  BP:   Pulse:   Temp:   Resp: 18     Physical Exam: Cardiac:  RRR Lungs:  CTAB Abdomen:  +BS non distended; -BM Incisions:  C/d/i (abdominal and bilateral groins) No drainage. Extremities:  +palpable bilateral DP.  Bilateral feet are warm and well perfused.  CBC    Component Value Date/Time   WBC 9.1 03/02/2011 0414   RBC 2.91* 03/02/2011 0414   HGB 9.6* 03/02/2011 0414   HCT 28.7* 03/02/2011 0414   PLT 170 03/02/2011 0414   MCV 98.6 03/02/2011 0414   MCH 33.0 03/02/2011 0414   MCHC 33.4 03/02/2011 0414   RDW 13.0 03/02/2011 0414   LYMPHSABS 2.8 02/25/2011 1534   MONOABS 0.6 02/25/2011 1534   EOSABS 0.1 02/25/2011 1534   BASOSABS 0.0 02/25/2011 1534    BMET    Component Value Date/Time   NA 143 03/02/2011 0414   K 3.6 03/02/2011 0414   CL 112 03/02/2011 0414   CO2 25 03/02/2011 0414   GLUCOSE 98 03/02/2011 0414   BUN 14 03/02/2011 0414   CREATININE 0.54 03/02/2011 0414   CALCIUM 8.1* 03/02/2011 0414   GFRNONAA >90 03/02/2011 0414   GFRAA >90 03/02/2011 0414    INR    Component Value Date/Time   INR 1.08 02/26/2011 1558     Intake/Output Summary (Last 24 hours) at 03/03/11 0856 Last data filed at 03/02/11 1946  Gross per 24 hour  Intake    600 ml  Output    450 ml  Net    150 ml     Assessment/Plan:  61 y.o. female is s/p AORTA BIFEMORAL BYPASS GRAFT   POD 5 -d/c foley -dulcolax supp this am -heplock IV -advance diet to full liquid diet. -mobilize/OOB to chair tid with meals.Newton Pigg, PA-C [facility] [phone] 03/03/2011 8:56 AM  Addendum:  Has tolerated liquid diet. Will advance to regular diet.  Palpable DP pulses bilaterally. Incisions look fine. Normal pitched bowel sounds. DC Foley and  IV fluid. Possibly home Monday or Tuesday.  Di Kindle. Edilia Bo, MD, FACS Beeper [phone] 03/03/2011

## 2011-03-04 MED ORDER — OXYCODONE HCL 5 MG PO TABS
5.0000 mg | ORAL_TABLET | ORAL | Status: DC | PRN
  Administered 2011-03-04 – 2011-03-06 (×6): 5 mg via ORAL
  Filled 2011-03-04 (×6): qty 1

## 2011-03-04 MED ORDER — FAMOTIDINE 20 MG PO TABS
20.0000 mg | ORAL_TABLET | Freq: Two times a day (BID) | ORAL | Status: DC
  Administered 2011-03-04 – 2011-03-06 (×5): 20 mg via ORAL
  Filled 2011-03-04 (×7): qty 1

## 2011-03-04 MED FILL — Heparin Sodium (Porcine) Inj 1000 Unit/ML: INTRAMUSCULAR | Qty: 30 | Status: AC

## 2011-03-04 MED FILL — Sodium Chloride IV Soln 0.9%: INTRAVENOUS | Qty: 1000 | Status: AC

## 2011-03-04 NOTE — Progress Notes (Addendum)
Physical Therapy Treatment Patient Details Name: Kimberly [name] MRN: [id number] DOB: 10/21/1950 Today's Date: 03/04/2011  PT Assessment/Plan  PT - Assessment/Plan Comments on Treatment Session: Ms. Kimberly [name] was agreable for ambulation but with poor tolerance for pain and c/o nausea and not feeling well pt not agreeable to much more. Discussed her d/c plan and when asking pt who was going to help her at home she was unable to provide anyone that could consistently help her move around. Gave pt information about SNF but she reports she can't pay. OT attempted to educate pt about medicaid options but pt unwilling to listen.  PT Plan: Discharge plan remains appropriate;Discharge plan needs to be updated Follow Up Recommendations: Supervision for mobility/OOB;Home health PT vs Skilled nursing facility (if she does not have the appropriate support) Equipment Recommended: Defer to next venue PT Goals  Acute Rehab PT Goals PT Goal: Supine/Side to Sit - Progress: Progressing toward goal PT Goal: Sit to Supine/Side - Progress: Progressing toward goal PT Goal: Sit to Stand - Progress: Progressing toward goal PT Goal: Stand to Sit - Progress: Progressing toward goal PT Goal: Ambulate - Progress: Progressing toward goal PT Goal: Perform Home Exercise Program - Progress: Progressing toward goal  PT Treatment Precautions/Restrictions  Precautions Precautions: Fall Restrictions Weight Bearing Restrictions: No Mobility (including Balance) Bed Mobility Supine to Sit: HOB elevated (Comment degrees);4: Min assist;With rails (40 degrees) Supine to Sit Details (indicate cue type and reason): pt pulling on therapists arm to assist  Sit to Supine: 4: Min assist Sit to Supine - Details (indicate cue type and reason): ed pt on rolling to get back in bed in order to reduce pain but pt proceeded to perform sit->supine as she moaned out in pain (did not acknowledge my advice)  Transfers Sit to Stand: 4: Min  assist;From bed Sit to Stand Details (indicate cue type and reason): cues for safety with RW (pt pulling on RW to stand)  Stand to Sit: 4: Min assist Stand to Sit Details: cues for technique Ambulation/Gait Ambulation/Gait Assistance: 4: Min assist Ambulation/Gait Assistance Details (indicate cue type and reason): amb with very slow gait and flexed trunk secondary to pain; decreased step height and length Assistive device: Rolling walker Gait Pattern: Trunk flexed;Shuffle  Posture/Postural Control Posture/Postural Control: Postural limitations Postural Limitations: flexed trunk 2/2 pain Balance Balance Assessed: No Exercise  Total Joint Exercises Ankle Circles/Pumps: AROM;Both;5 reps;Seated Long Arc Quad: AROM;Both;5 reps;Seated End of Session PT - End of Session Equipment Utilized During Treatment: Gait belt Activity Tolerance: Patient limited by pain;Treatment limited secondary to medical complications (Comment) (feeling nauseous) Patient left: in bed;with call bell in reach Nurse Communication: Mobility status for transfers; Mobility status for ambulation; Pt requesting something for nausea General Behavior During Session: Agitated Cognition: WFL for tasks performed  [facility] Kimberly [name] 03/04/2011, 12:40 PM

## 2011-03-04 NOTE — Consult Note (Signed)
Pt has been a smoker of 1 ppd but very recently has cut down to 1/2 ppd and states that after this visit to the hospital she is never picking up another cigarette. Recommended and encouraged pt to quit. She is in action stage. Verbalizes understanding of the risk factors. Referred to 1-800 quit now for f/u and support. Discussed oral fixation substitutes, second hand smoke and in home smoking policy. Reviewed and gave pt Written education/contact information.

## 2011-03-04 NOTE — Progress Notes (Signed)
Occupational Therapy Treatment Patient Details Name: Kimberly [name] MRN: [id number] DOB: 1950/02/07 Today's Date: 03/04/2011  OT Assessment/Plan OT Assessment/Plan Comments on Treatment Session: Pt continues to be limited by pain. If pt is to d/c home within the next day, will need more assist than she states she has available. Pt also states she does not have insurance and cannot afford to go to SNF. When Medicaid was mentioned, pt became very angry stating she has been down that road before and "Medicaid is only for illegal immigrants" OT Plan: Discharge plan needs to be updated;Frequency needs to be updated OT Frequency: Min 2X/week Follow Up Recommendations: Home health OT Equipment Recommended: 3 in 1 bedside comode OT Goals ADL Goals ADL Goal: Grooming - Progress: Progressing toward goals ADL Goal: Lower Body Dressing - Progress: Not progressing ADL Goal: Toilet Transfer - Progress: Progressing toward goals ADL Goal: Toileting - Hygiene - Progress: Progressing toward goals  OT Treatment Precautions/Restrictions  Precautions Precautions: Fall Restrictions Weight Bearing Restrictions: No   ADL ADL Grooming: Performed;Set up;Wash/dry face Grooming Details (indicate cue type and reason): Pt c/o nausea upon supine to sit. Prepared cold, wet washcloth to patient who was able to wipe face  Where Assessed - Grooming: Standing at sink Lower Body Dressing: Performed;+1 Total assistance Lower Body Dressing Details (indicate cue type and reason): limited by pain. unable to don socks Where Assessed - Lower Body Dressing: Sitting, bed Toilet Transfer: Performed;Minimal assistance Toilet Transfer Details (indicate cue type and reason): slow gait and increased time due to pain and DOE Toilet Transfer Method: Proofreader: Set designer - Clothing Manipulation: Performed;Minimal assistance Where Assessed - Glass blower/designer Manipulation:  Standing Toileting - Hygiene: Performed;Minimal assistance Where Assessed - Toileting Hygiene: Sit to stand from 3-in-1 or toilet Ambulation Related to ADLs: Min A with RW ambulation. pt with slow gait and labored mouth breathing despite VC for breathing in through nose and out through mouth. O2 sats remained above 90% on R/A. Pt also c/o nausea throughout transfer Mobility  Transfers Sit to Stand: 4: Min assist;From bed Sit to Stand Details (indicate cue type and reason): cues for safety with RW (pt pulling on RW to stand)  Stand to Sit: 4: Min assist Stand to Sit Details: cues for technique  End of Session OT - End of Session Equipment Utilized During Treatment: Gait belt Activity Tolerance: Patient limited by pain Patient left: in bed;with family/visitor present;with call bell in reach General Behavior During Session: Agitated Cognition: WFL for tasks performed  Kimberly [name]  03/04/2011, 11:54 AM

## 2011-03-04 NOTE — Progress Notes (Signed)
VASCULAR LAB PRELIMINARY  ARTERIAL  ABI completed:  ABIs normal post operatively    RIGHT    LEFT    PRESSURE WAVEFORM  PRESSURE WAVEFORM  BRACHIAL 169 tri BRACHIAL Restricted arm   DP 181 mono DP 183 tri  AT   AT    PT 174 bi PT 180 bi  PER   PER    GREAT TOE  NA GREAT TOE  NA    RIGHT LEFT  ABI 1.07 1.08     Storm Sovine, Karsten Ro,  RVT 03/04/2011, 8:30 AM

## 2011-03-04 NOTE — Progress Notes (Addendum)
[facility] Progress Note  03/04/2011 7:29 AM POD 6   Subjective:  Still aggravated about the IV beeping night before last; wants her bedside toilet cleaned.  States she has not been up to chair or OOB except to go to bedside toilet.   Filed Vitals:   03/04/11 0428  BP: 136/82  Pulse: 78  Temp: 98.4 F (36.9 C)  Resp: 19     Physical Exam: Cardiac:  RRR Lungs:  CTAB Abdomen:  Soft; slightly tender with palpation, ND, +BM Incisions:  C/d/i without drainage (abdominal incision as well as bilateral groin incisions) Extremities:  +palpable DP bilaterally  CBC    Component Value Date/Time   WBC 9.1 03/02/2011 0414   RBC 2.91* 03/02/2011 0414   HGB 9.6* 03/02/2011 0414   HCT 28.7* 03/02/2011 0414   PLT 170 03/02/2011 0414   MCV 98.6 03/02/2011 0414   MCH 33.0 03/02/2011 0414   MCHC 33.4 03/02/2011 0414   RDW 13.0 03/02/2011 0414   LYMPHSABS 2.8 02/25/2011 1534   MONOABS 0.6 02/25/2011 1534   EOSABS 0.1 02/25/2011 1534   BASOSABS 0.0 02/25/2011 1534    BMET    Component Value Date/Time   NA 143 03/02/2011 0414   K 3.6 03/02/2011 0414   CL 112 03/02/2011 0414   CO2 25 03/02/2011 0414   GLUCOSE 98 03/02/2011 0414   BUN 14 03/02/2011 0414   CREATININE 0.54 03/02/2011 0414   CALCIUM 8.1* 03/02/2011 0414   GFRNONAA >90 03/02/2011 0414   GFRAA >90 03/02/2011 0414    INR    Component Value Date/Time   INR 1.08 02/26/2011 1558     Intake/Output Summary (Last 24 hours) at 03/04/11 0729 Last data filed at 03/03/11 1500  Gross per 24 hour  Intake    720 ml  Output   1500 ml  Net   -780 ml     Assessment/Plan:  62 y.o. female is s/p Aortobifem bypass  POD 6 -stressed to pt importance of mobilization.  Has not been OOB to chair per pt despite order for OOB tid with meals and ambulate pt. -d/c PCA.  Start po pain meds -heplock IV -possibly home tomorrow.   Newton Pigg, PA-C [facility] [phone] 03/04/2011 7:29 AM   Pt down  getting ABI this am, will see later today.  Spoke and updated daughter  Durene Cal

## 2011-03-05 DIAGNOSIS — Z48812 Encounter for surgical aftercare following surgery on the circulatory system: Secondary | ICD-10-CM

## 2011-03-05 MED ORDER — OXYCODONE HCL 5 MG PO TABS: 5.0000 mg | ORAL_TABLET | ORAL | Status: AC | PRN

## 2011-03-05 MED ORDER — DOCUSATE SODIUM 100 MG PO CAPS: 100.0000 mg | ORAL_CAPSULE | Freq: Two times a day (BID) | ORAL | Status: DC | PRN

## 2011-03-05 NOTE — Progress Notes (Signed)
Pt ambulated in hall with family.  Approximately 100 feet with front wheel walker.  Pt tolerated well. Thomas Hoff

## 2011-03-05 NOTE — Progress Notes (Addendum)
[facility] Progress Note  03/05/2011 7:46 AM POD 7  Subjective:  Pt states that she has been nauseated from the smell of the hand sanitizer.    Filed Vitals:   03/05/11 0456  BP: 130/76  Pulse: 84  Temp: 98.2 F (36.8 C)  Resp: 20     Physical Exam: Cardiac:  RRR Lungs:  CTAB Abdomen:  Soft; NT/ND +BS; no BM yesterday Incisions:  C/d/i without drainage.  No sign of infection Extremities:  Warm and well perfused.  CBC    Component Value Date/Time   WBC 9.1 03/02/2011 0414   RBC 2.91* 03/02/2011 0414   HGB 9.6* 03/02/2011 0414   HCT 28.7* 03/02/2011 0414   PLT 170 03/02/2011 0414   MCV 98.6 03/02/2011 0414   MCH 33.0 03/02/2011 0414   MCHC 33.4 03/02/2011 0414   RDW 13.0 03/02/2011 0414   LYMPHSABS 2.8 02/25/2011 1534   MONOABS 0.6 02/25/2011 1534   EOSABS 0.1 02/25/2011 1534   BASOSABS 0.0 02/25/2011 1534    BMET    Component Value Date/Time   NA 143 03/02/2011 0414   K 3.6 03/02/2011 0414   CL 112 03/02/2011 0414   CO2 25 03/02/2011 0414   GLUCOSE 98 03/02/2011 0414   BUN 14 03/02/2011 0414   CREATININE 0.54 03/02/2011 0414   CALCIUM 8.1* 03/02/2011 0414   GFRNONAA >90 03/02/2011 0414   GFRAA >90 03/02/2011 0414    INR    Component Value Date/Time   INR 1.08 02/26/2011 1558     Intake/Output Summary (Last 24 hours) at 03/05/11 0746 Last data filed at 03/04/11 1230  Gross per 24 hour  Intake    240 ml  Output      0 ml  Net    240 ml   ABIs 03/04/11  RIGHT    LEFT     PRESSURE  WAVEFORM   PRESSURE  WAVEFORM   BRACHIAL  169  tri  BRACHIAL  Restricted arm    DP  181  mono  DP  183  tri   AT    AT     PT  174  bi  PT  180  bi   PER    PER     GREAT TOE   NA  GREAT TOE   NA     RIGHT  LEFT   ABI  1.07  1.08      Assessment/Plan:  61 y.o. female is s/p Aortobifem bypass graft  POD 7 -will use stool softener this am.  Does not want supp. -needs to mobilize -ABIs post op look good. -social work for d/c planning    Newton Pigg,  PA-C [facility] [phone] 03/05/2011 7:46 AM  Agree with the above.  D/C soon Durene Cal

## 2011-03-05 NOTE — Discharge Summary (Signed)
[facility] Discharge Summary  Kimberly [name] 1950/09/23 61 y.o. female  [id number]  Admission Date: 02/26/2011  Discharge Date: 03/07/11  Physician: Juleen China, MD  Admission Diagnosis: Peripheral Vascular Disease   HPI:   This is a 61 y.o. 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 She was kept overnight for a cardiac workup. She had a stress test which showed an ejection fraction of 75% without reversible defects. The patient denies any ulceration. She is very fatigued her walking is limited by pain. She states that this is making her depressed. She's medically managed for hypertension. She continues to be a smoker.   Hospital Course:  The patient was admitted to the hospital and taken to the operating room on 02/26/2011 and underwent Aortobifemoral bypass graft .  The pt tolerated the procedure well and was transported to the PACU in good condition. By POD 1, she was having muscle spasms and she was started on toradol, which helped.  Her NGT was removed that day.  She was left in the ICU that day.  She did have acute surgical blood loss anemia.  By POD 2, she was much improved.  On POD 3, she was advanced to a clear liquid diet and transferred to 2000.  She was eventually advanced to a regular diet and she tolerated this well.  She did receive a supp for BM.   She did have an elevated WBC and questionable UTI and therefore sent home with ABx. The remainder of the hospital course consisted of increasing ambulation and increasing intake of solids without difficulty.  ABIs postoperatively on 03/04/11 are below:  RIGHT    LEFT      PRESSURE  WAVEFORM   PRESSURE  WAVEFORM   BRACHIAL  169  tri  BRACHIAL  Restricted arm    DP  181  mono  DP  183  tri   AT    AT     PT  174  bi  PT  180  bi   PER    PER     GREAT TOE   NA  GREAT TOE   NA      RIGHT  LEFT   ABI  1.07  1.08    CBC    Component Value Date/Time   WBC 9.1 03/02/2011 0414   RBC 2.91* 03/02/2011 0414   HGB 9.6* 03/02/2011 0414   HCT 28.7* 03/02/2011 0414   PLT 170 03/02/2011 0414   MCV 98.6 03/02/2011 0414   MCH 33.0 03/02/2011 0414   MCHC 33.4 03/02/2011 0414   RDW 13.0 03/02/2011 0414   LYMPHSABS 2.8 02/25/2011 1534   MONOABS 0.6 02/25/2011 1534   EOSABS 0.1 02/25/2011 1534   BASOSABS 0.0 02/25/2011 1534    BMET    Component Value Date/Time   NA 143 03/02/2011 0414   K 3.6 03/02/2011 0414   CL 112 03/02/2011 0414   CO2 25 03/02/2011 0414   GLUCOSE 98 03/02/2011 0414   BUN 14 03/02/2011 0414   CREATININE 0.54 03/02/2011 0414   CALCIUM 8.1* 03/02/2011 0414   GFRNONAA >90 03/02/2011 0414   GFRAA >90 03/02/2011 0414    Discharge Instructions:   The patient is discharged to home with extensive instructions on wound care and progressive ambulation.  They are instructed not to drive or perform any heavy lifting until returning to  see the physician in his office.  Discharge Orders    Future Appointments: Provider: Department: Dept Phone: Center:   03/25/2011 10:30 AM [facility] [phone] [facility]   03/25/2011 11:30 AM V Durene Cal, MD [facility] [phone] [facility]      Discharge Diagnosis:  Peripheral Vascular Disease  Secondary Diagnosis: Patient Active Problem List  Diagnoses  . TOBACCO ABUSE  . DENTAL PAIN  . MENOPAUSE, SURGICAL  . LYMPHADENOPATHY  . HYPERCHOLESTEROLEMIA  . INTERMITTENT CLAUDICATION  . CYSTITIS, ACUTE  . PAP SMEAR, LGSIL, ABNORMAL  . Subclavian artery stenosis  . PVD (peripheral vascular disease) with claudication   Past Medical History  Diagnosis Date  . Uterine cancer   . H/O: hysterectomy   . Asthma   . Hyperlipidemia   . History of lead poisoning 1977  . Shortness of breath   . Peripheral vascular disease     claudication  right greater than left  femoral artery  . Anxiety     Kimberly, [name]  Home  Medication Instructions GMW:[id number]   Printed on:03/05/11 1504  Medication Information                    fish oil-omega-3 fatty acids 1000 MG capsule Take 2 g by mouth daily.            Multiple Vitamin (MULITIVITAMIN WITH MINERALS) TABS Take 1 tablet by mouth daily.           loratadine (CLARITIN) 5 MG/5ML syrup Take 10 mg by mouth daily as needed. For allergies           aspirin EC 81 MG tablet Take 81 mg by mouth daily.           ibuprofen (ADVIL,MOTRIN) 200 MG tablet Take 400-600 mg by mouth every 6 (six) hours as needed. For pain           oxyCODONE (OXY IR/ROXICODONE) 5 MG immediate release tablet Take 1 tablet (5 mg total) by mouth every 4 (four) hours as needed. # thirty NR          Cipro 500mg  po bid for possible UTI x 7 days  Disposition: home  Patient's condition: is Good  Follow up: 1. Dr. Myra Gianotti in 2 weeks.   Newton Pigg, PA-C [facility] [phone] 03/05/2011  2:57 PM

## 2011-03-05 NOTE — Progress Notes (Signed)
Physical Therapy Treatment Patient Details Name: Kimberly [name] MRN: [id number] DOB: Jul 01, 1950 Today's Date: 03/05/2011  PT Assessment/Plan  PT - Assessment/Plan Comments on Treatment Session: Much more agreeable for therapy today. Ambulating further distance. If pt able to achieve modified independent level she would be safe to d/c home with HHPT. I believe this is a realistic goal if pt is motivated. Reports her ex husband [name] could help her if she needed him. PT Plan: Discharge plan needs to be updated Follow Up Recommendations: Home health PT;Supervision for mobility/OOB vs Skilled nursing facility Equipment Recommended: Rolling walker with 5" wheels (if pt goes home she'll need RW) PT Goals  Acute Rehab PT Goals PT Goal: Rolling Supine to Right Side - Progress: Progressing toward goal PT Goal: Supine/Side to Sit - Progress: Progressing toward goal PT Goal: Sit to Stand - Progress: Progressing toward goal PT Goal: Stand to Sit - Progress: Progressing toward goal PT Goal: Ambulate - Progress: Progressing toward goal PT Goal: Perform Home Exercise Program - Progress: Progressing toward goal  PT Treatment Precautions/Restrictions  Precautions Precautions: Fall Restrictions Weight Bearing Restrictions: No Mobility (including Balance) Bed Mobility Rolling Right: 5: Supervision;With rail Rolling Right Details (indicate cue type and reason): cues for technique Right Sidelying to Sit: 5: Supervision;With rails;HOB elevated (comment degrees) (30 degrees) Transfers Sit to Stand: From bed;With upper extremity assist Sit to Stand Details (indicate cue type and reason): stood with mingaurdA with cues for safe hand placement Stand to Sit: 5: Supervision Stand to Sit Details: cues for safe hand placement Ambulation/Gait Ambulation/Gait Assistance Details (indicate cue type and reason): mingaurdA; flexed trunk and short shuffled steps; cues for more upright posture and to gently stretch  stomach within limits of pain; cues for safety with RW Ambulation Distance (Feet): 80 Feet Assistive device: Rolling walker  Posture/Postural Control Posture/Postural Control: Postural limitations Postural Limitations: flexed trunk 2/2 pain; tight hip flexors Exercise  Total Joint Exercises Ankle Circles/Pumps: AROM;Both;10 reps;Supine Long Arc Quad: AROM;Both;10 reps;Seated Other Exercises Other Exercises: standing ham string curls x10 with cues for technique  End of Session PT - End of Session Equipment Utilized During Treatment: Gait belt Activity Tolerance: Patient tolerated treatment well;Patient limited by fatigue Patient left: in chair;with call bell in reach Nurse Communication: Mobility status for transfers;Mobility status for ambulation General Behavior During Session: [facility] for tasks performed Cognition: [facility] for tasks performed  [facility] HELEN 03/05/2011, 4:21 PM

## 2011-03-05 NOTE — Progress Notes (Signed)
Clinical Social Worker received consult for "SNF." Currently, Pt is recommending home health PT vs. SNF. CSW met with pt to address consult. CSW introduced herself and explained the process of SNF with a letter of guarentee as pt does not have insurance. Please see psychosocial assessment for further information in pt's chart. Pt prefers to go home at discharge. CSW will continue to follow to assess discharge needs.   Dede Query, MSW, Theresia Majors [phone]

## 2011-03-05 NOTE — Progress Notes (Signed)
Utilization review completed. Zoltan Genest, RN, BSN. 03/05/11 

## 2011-03-06 MED ORDER — DOCUSATE SODIUM 100 MG PO CAPS
100.0000 mg | ORAL_CAPSULE | Freq: Two times a day (BID) | ORAL | Status: DC
  Administered 2011-03-06 – 2011-03-07 (×3): 100 mg via ORAL
  Filled 2011-03-06 (×4): qty 1

## 2011-03-06 MED ORDER — IBUPROFEN 400 MG PO TABS
400.0000 mg | ORAL_TABLET | Freq: Four times a day (QID) | ORAL | Status: DC | PRN
  Filled 2011-03-06: qty 2

## 2011-03-06 MED ORDER — ADULT MULTIVITAMIN W/MINERALS CH
1.0000 | ORAL_TABLET | Freq: Every day | ORAL | Status: DC
  Administered 2011-03-06 – 2011-03-07 (×2): 1 via ORAL
  Filled 2011-03-06 (×2): qty 1

## 2011-03-06 MED ORDER — ASPIRIN EC 81 MG PO TBEC
81.0000 mg | DELAYED_RELEASE_TABLET | Freq: Every day | ORAL | Status: DC
  Administered 2011-03-06 – 2011-03-07 (×2): 81 mg via ORAL
  Filled 2011-03-06 (×2): qty 1

## 2011-03-06 MED ORDER — LORATADINE 5 MG/5ML PO SYRP
10.0000 mg | ORAL_SOLUTION | Freq: Every day | ORAL | Status: DC | PRN
  Filled 2011-03-06: qty 10

## 2011-03-06 MED ORDER — OMEGA-3-ACID ETHYL ESTERS 1 G PO CAPS
2.0000 g | ORAL_CAPSULE | Freq: Every day | ORAL | Status: DC
  Administered 2011-03-06 – 2011-03-07 (×2): 2 g via ORAL
  Filled 2011-03-06 (×2): qty 2

## 2011-03-06 MED ORDER — OMEGA-3 FATTY ACIDS 1000 MG PO CAPS: 2.0000 g | ORAL_CAPSULE | Freq: Every day | ORAL | Status: DC

## 2011-03-06 NOTE — Progress Notes (Signed)
Pt refused walk this am. Encouraged several different times, but pt still does not want to go for walk. Will continue to encourage throughout the day.

## 2011-03-06 NOTE — Progress Notes (Signed)
Occupational Therapy Treatment Patient Details Name: Kimberly [name] MRN: [id number] DOB: July 16, 1950 Today's Date: 03/06/2011  OT Assessment/Plan OT Assessment/Plan Comments on Treatment Session: Progress well but remains limited by pain and weakness. Anticipate pt will be able to d/c home as long as she reaches Mod I level prior to d/c. Ex-husband, Kimberly Needle, will be available to help "some" after d/c OT Plan: Discharge plan remains appropriate Follow Up Recommendations: Home health OT Equipment Recommended:  (TBD) OT Goals ADL Goals Pt Will Transfer to Toilet: with modified independence;Ambulation;Regular height toilet ADL Goal: Toilet Transfer - Progress: Updated due to goal met Pt Will Perform Toileting - Hygiene: Independently;Sitting on 3-in-1 or toilet ADL Goal: Toileting - Hygiene - Progress: Updated due to goal met Pt Will Perform Tub/Shower Transfer: Tub transfer;Ambulation;Shower seat without back ADL Goal: Web designer - Progress: Goal set today  OT Treatment Precautions/Restrictions  Precautions Precautions: Fall Restrictions Weight Bearing Restrictions: No   ADL ADL Toilet Transfer: Research scientist (life sciences) Details (indicate cue type and reason): Pt with kyphotic posture with ambulation- encouraged upright standing. Also, required VC to remain with RW as pt attempted to leave RW aside and hurry to bathroom Toilet Transfer Method: Ambulating Toilet Transfer Equipment: Regular height toilet;Grab bars Toileting - Clothing Manipulation: Performed;Independent Where Assessed - Toileting Clothing Manipulation: Standing Toileting - Hygiene: Performed;Supervision/safety Where Assessed - Toileting Hygiene: Standing Tub/Shower Transfer:  (see ADL comments section below) Ambulation Related to ADLs: see toileting ADL Comments: Performed bilateral UE supported bent leg lifts at sink in prep for tub/shower transfer. Pt states her plan for home is to bathe  up at the edge of the tub without getting into tub. Explained to pt that acquiring a tub/shower seat and showering this way is much safer. Pt not interested at this time. Unable to perform simulated tub/shower transfer as pt too fatigued after toileting. Pt states she has not been eating much (1/2 banana for breakfast). Encouraged pt to try to eat more as this is probably playing into her weakness and fatigue Mobility  Bed Mobility Rolling Right: 5: Supervision (no rail/ HOB flat) Right Sidelying to Sit: 5: Supervision (no rail/ HOB flat)  End of Session OT - End of Session Equipment Utilized During Treatment: Gait belt Activity Tolerance: Patient limited by pain;Patient limited by fatigue Patient left: in bed;with call bell in reach;with family/visitor present  Kimberly [name]  03/06/2011, 11:58 AM

## 2011-03-06 NOTE — Progress Notes (Signed)
Pt ambulated with rolling walker approx 50 feet. Pt felt SOB and overall "sick and icky."

## 2011-03-06 NOTE — Progress Notes (Signed)
[facility] Progress Note  03/06/2011 7:52 AM POD 8+  Subjective:  Overall does not feel well.  States that her right foot started hurting.  The smell of the hand sanitizer makes her nauseated.   Afebrile  130s sys   94%RA Filed Vitals:   03/05/11 2028  BP: 137/72  Pulse: 75  Temp: 98.7 F (37.1 C)  Resp: 18     Physical Exam: Cardiac:  RRR Lungs:  CTAB Abdomen:  Soft NT/ND +BS -BM since Sunday/Monday Incisions:  Bilateral groin incisions and abdominal incision are c/d/i without drainage and no sign of infection. Extremities:  BLE are warm and well perfused with excellent palpable DP pulses bilaterally.  CBC    Component Value Date/Time   WBC 9.1 03/02/2011 0414   RBC 2.91* 03/02/2011 0414   HGB 9.6* 03/02/2011 0414   HCT 28.7* 03/02/2011 0414   PLT 170 03/02/2011 0414   MCV 98.6 03/02/2011 0414   MCH 33.0 03/02/2011 0414   MCHC 33.4 03/02/2011 0414   RDW 13.0 03/02/2011 0414   LYMPHSABS 2.8 02/25/2011 1534   MONOABS 0.6 02/25/2011 1534   EOSABS 0.1 02/25/2011 1534   BASOSABS 0.0 02/25/2011 1534    BMET    Component Value Date/Time   NA 143 03/02/2011 0414   K 3.6 03/02/2011 0414   CL 112 03/02/2011 0414   CO2 25 03/02/2011 0414   GLUCOSE 98 03/02/2011 0414   BUN 14 03/02/2011 0414   CREATININE 0.54 03/02/2011 0414   CALCIUM 8.1* 03/02/2011 0414   GFRNONAA >90 03/02/2011 0414   GFRAA >90 03/02/2011 0414    INR    Component Value Date/Time   INR 1.08 02/26/2011 1558     Intake/Output Summary (Last 24 hours) at 03/06/11 0752 Last data filed at 03/05/11 1230  Gross per 24 hour  Intake    240 ml  Output    651 ml  Net   -411 ml     Assessment/Plan:  61 y.o. female is s/p Aortobifem repair  POD 8 -will have nurse hang sign to NOT use hand sanitizer in this pts room. -does not want suppository.  Changed order for colace bid prn to bid. -continue to mobilize and OOB to chair. -ck labs in am.   Newton Pigg, PA-C [facility] [phone] 03/06/2011 7:52 AM

## 2011-03-07 LAB — CBC
HCT: 30 % — ABNORMAL LOW (ref 36.0–46.0)
Hemoglobin: 10.6 g/dL — ABNORMAL LOW (ref 12.0–15.0)
MCH: 33.1 pg (ref 26.0–34.0)
MCHC: 35.3 g/dL (ref 30.0–36.0)
MCV: 93.8 fL (ref 78.0–100.0)
Platelets: 287 10*3/uL (ref 150–400)
RBC: 3.2 MIL/uL — ABNORMAL LOW (ref 3.87–5.11)
RDW: 12.7 % (ref 11.5–15.5)
WBC: 13.4 10*3/uL — ABNORMAL HIGH (ref 4.0–10.5)

## 2011-03-07 LAB — BASIC METABOLIC PANEL
BUN: 7 mg/dL (ref 6–23)
CO2: 24 mEq/L (ref 19–32)
Calcium: 8.4 mg/dL (ref 8.4–10.5)
Chloride: 99 mEq/L (ref 96–112)
Creatinine, Ser: 0.47 mg/dL — ABNORMAL LOW (ref 0.50–1.10)
GFR calc Af Amer: >90 mL/min (ref >90)
GFR calc non Af Amer: >90 mL/min (ref >90)
Glucose, Bld: 80 mg/dL (ref 70–99)
Potassium: 3.2 mEq/L — ABNORMAL LOW (ref 3.5–5.1)
Sodium: 135 mEq/L (ref 135–145)

## 2011-03-07 MED ORDER — POTASSIUM CHLORIDE CRYS ER 20 MEQ PO TBCR
20.0000 meq | EXTENDED_RELEASE_TABLET | Freq: Two times a day (BID) | ORAL | Status: DC
  Administered 2011-03-07: 20 meq via ORAL
  Filled 2011-03-07: qty 1

## 2011-03-07 NOTE — Progress Notes (Signed)
CSW met with pt to confirm discharge plan to return home. Pt reported that she is going home and inquired about a walker. CSW contacted RNCM regarding walker and she will follow up. CSW is signing off as no further social work needs identified.   Dede Query, MSW, Theresia Majors [phone]

## 2011-03-07 NOTE — Progress Notes (Signed)
Pt did not feel up to walking this evening, she has not felt well today.  Will continue to encourage pt to walk more tomorrow.  Kimberly [name]

## 2011-03-07 NOTE — Progress Notes (Signed)
Physical Therapy Treatment Patient Details Name: Kimberly [name] MRN: [id number] DOB: [birth year] Today's Date: 03/07/2011  PT Assessment/Plan  PT - Assessment/Plan Comments on Treatment Session: Pt s/p BPG improving slowly. Pt still self-limiting but able to ambulate much further in the hall today and encouraged to continue ambulating with nursing and performing HEP. Pt motivated to return home. Will continue to follow. PT Plan: Discharge plan needs to be updated Follow Up Recommendations: Home health PT Equipment Recommended: Rolling walker with 5" wheels PT Goals  Acute Rehab PT Goals PT Goal: Rolling Supine to Right Side - Progress: Met PT Goal: Sit to Stand - Progress: Met PT Goal: Stand to Sit - Progress: Met PT Goal: Ambulate - Progress: Progressing toward goal PT Goal: Perform Home Exercise Program - Progress: Progressing toward goal  PT Treatment Precautions/Restrictions  Precautions Precautions: Fall Restrictions Weight Bearing Restrictions: No Mobility (including Balance) Bed Mobility Supine to Sit: 6: Modified independent (Device/Increase time);With rails;HOB flat Transfers Sit to Stand: 6: Modified independent (Device/Increase time);From bed Stand to Sit: 6: Modified independent (Device/Increase time);To chair/3-in-1;With armrests Ambulation/Gait Ambulation/Gait: Yes Ambulation/Gait Assistance: 5: Supervision Ambulation/Gait Assistance Details (indicate cue type and reason): cues to step into RW, extend hips and increase stride length Ambulation Distance (Feet): 250 Feet Assistive device: Rolling walker Gait Pattern: Decreased stride length Gait velocity: slowly, not formally assessed  Posture/Postural Control Posture/Postural Control: Postural limitations Exercise  General Exercises - Lower Extremity Long Arc Quad: AROM;10 reps;Both;Seated Other Exercises Other Exercises: standing hip extension bil LE x 10 End of Session PT - End of Session Activity  Tolerance: Patient limited by fatigue;Patient limited by pain Patient left: in chair;with call bell in reach Nurse Communication: Mobility status for transfers;Mobility status for ambulation General Behavior During Session: [facility] for tasks performed Cognition: [facility] for tasks performed  Delorse Lek 03/07/2011, 9:58 AM Toney Sang, PT [phone]

## 2011-03-07 NOTE — Progress Notes (Addendum)
[facility] Progress Note  03/07/2011 10:01 AM POD 9  Subjective:  Still sore.  Does not want any codone meds to go home with as they make her feel "funny"  Afebrile  98%RA Filed Vitals:   03/07/11 0742  BP: 130/65  Pulse: 75  Temp: 97.5 F (36.4 C)  Resp: 18     Physical Exam: Cardiac:  RRR Lungs:  CTAB Abdomen:  Soft; slight tenderness with palpation; +BM with stool softener Incisions:  All incisions are c/d/i and no signs of infection Extremities:  +bilateral palpable DP bilaterally; minimal edema BLE  CBC    Component Value Date/Time   WBC 13.4* 03/07/2011 0605   RBC 3.20* 03/07/2011 0605   HGB 10.6* 03/07/2011 0605   HCT 30.0* 03/07/2011 0605   PLT 287 03/07/2011 0605   MCV 93.8 03/07/2011 0605   MCH 33.1 03/07/2011 0605   MCHC 35.3 03/07/2011 0605   RDW 12.7 03/07/2011 0605   LYMPHSABS 2.8 02/25/2011 1534   MONOABS 0.6 02/25/2011 1534   EOSABS 0.1 02/25/2011 1534   BASOSABS 0.0 02/25/2011 1534    BMET    Component Value Date/Time   NA 135 03/07/2011 0605   K 3.2* 03/07/2011 0605   CL 99 03/07/2011 0605   CO2 24 03/07/2011 0605   GLUCOSE 80 03/07/2011 0605   BUN 7 03/07/2011 0605   CREATININE 0.47* 03/07/2011 0605   CALCIUM 8.4 03/07/2011 0605   GFRNONAA >90 03/07/2011 0605   GFRAA >90 03/07/2011 0605    INR    Component Value Date/Time   INR 1.08 02/26/2011 1558     Intake/Output Summary (Last 24 hours) at 03/07/11 1001 Last data filed at 03/06/11 1810  Gross per 24 hour  Intake    240 ml  Output      0 ml  Net    240 ml     Assessment/Plan:  61 y.o. female is s/p aortobifem bypass  POD 9 -WBC up this am.  Pt states she hasn't been having painful urination, but her urine has an odor -ck UA -continue mobilization -acute surgical blood loss anemia stable -TED hose -hypokalemia--supplement -ck labs in am  Newton Pigg, PA-C [facility] [phone] 03/07/2011 10:01 AM Agree with above, wil check UA Plan d/c  today  Durene Cal

## 2011-03-10 NOTE — Discharge Summary (Signed)
Agree with above  Wells Melodye Swor 

## 2011-03-13 ENCOUNTER — Encounter: Payer: Self-pay | Admitting: Vascular Surgery

## 2011-03-13 ENCOUNTER — Telehealth: Payer: Self-pay

## 2011-03-13 DIAGNOSIS — G8918 Other acute postprocedural pain: Secondary | ICD-10-CM

## 2011-03-13 MED ORDER — TRAMADOL HCL 50 MG PO TABS: 50.0000 mg | ORAL_TABLET | Freq: Four times a day (QID) | ORAL | Status: AC | PRN

## 2011-03-13 NOTE — Telephone Encounter (Signed)
Pt. called with c/o pain in left lower quadrant between umbilicus and left groin. States pain started last evening and rates at 6/10.  States hasn't taken any Oxycodone due to the way it makes her feel.  Has been using Tylenol alternating with Ibuprofen every 4 hrs.  Denies fever or chills.  Denies redness or inflammation around incision.  States area in left lower quad. is tender to touch, but denies redness.  States having regular bowel movements and passing gas.  Denies any nausea/vomiting or abdominal bloating.  States it feels like I have a UTI, but denies any urgency, or frequency, or burning with urination.  States is urinating in adequate amts.  Questioned pt. if she needs Tramadol for pain control?  Stated she took it in hospital and it helped.  Appt. Given to be evaluated in office 03/14/11, and will call in Tramadol for pain.  Advised pt. to call back, or go to ER if symptoms worsen this afternoon/evening.  Verb. understanding.

## 2011-03-14 ENCOUNTER — Ambulatory Visit (INDEPENDENT_AMBULATORY_CARE_PROVIDER_SITE_OTHER): Payer: Self-pay | Admitting: Physician Assistant

## 2011-03-14 VITALS — BP 158/73 | HR 95 | Resp 18 | Ht 64.0 in | Wt 130.0 lb

## 2011-03-14 DIAGNOSIS — I70219 Atherosclerosis of native arteries of extremities with intermittent claudication, unspecified extremity: Secondary | ICD-10-CM | POA: Insufficient documentation

## 2011-03-14 NOTE — Progress Notes (Signed)
[facility] POST OPERATIVE OFFICE NOTE  CC:  F/u for surgery  HPI:  This is a 61 y.o. female who is s/p Aortobifemoral bypass graft on 02/26/11.  She returns today with lower abdominal pain.  She denies nausea, vomiting and she is having regular bowel movements with stool softeners.  She is not having any problems eating.  She is not mobilizing much.  She is not taking her oxyIR as it makes her feel bad.  She is taking the tramadol that was called in and this is helping with her pain.  No Known Allergies  Current Outpatient Prescriptions  Medication Sig Dispense Refill  . aspirin EC 81 MG tablet Take 81 mg by mouth daily.      . fish oil-omega-3 fatty acids 1000 MG capsule Take 2 g by mouth daily.       Marland Kitchen ibuprofen (ADVIL,MOTRIN) 200 MG tablet Take 400-600 mg by mouth every 6 (six) hours as needed. For pain      . loratadine (CLARITIN) 5 MG/5ML syrup Take 10 mg by mouth daily as needed. For allergies      . Multiple Vitamin (MULITIVITAMIN WITH MINERALS) TABS Take 1 tablet by mouth daily.      Marland Kitchen oxyCODONE (OXY IR/ROXICODONE) 5 MG immediate release tablet Take 1 tablet (5 mg total) by mouth every 4 (four) hours as needed.  30 tablet  0  . traMADol (ULTRAM) 50 MG tablet Take 1 tablet (50 mg total) by mouth every 6 (six) hours as needed for pain.  20 tablet  0     ROS:  See HPI  Physical Exam:  Filed Vitals:   03/14/11 1519  BP: 158/73  Pulse: 95  Resp: 18    Incision:  Abdominal and bilateral groin incisions are healing nicely. Extremities:  + palpable DP pulses bilaterally Abdomen: Soft; she is slightly tender to palpation in the right and left lower quadrants. Non distended.  A/P:  This is a 61 y.o. female here for pain in her abdomen. Dr. Darrick Penna also examined the pt.  Given she has not had any N/V and is having BMs and is eating ok, her abdominal pain is more than likely soreness.  She is told that she really needs to mobilize more.  If she starts having N/V, and  not able to eat, she needs to let us know.  She will schedule a f/u appt with Dr. Myra Gianotti in 1-2 weeks.  Dr. Darrick Penna also reassured the daughter that up to 6 weeks after surgery, she can eat what she wants to get the calories and protein to heal.    Newton Pigg, PA-C [facility] [phone]  Clinic MD:  Darrick Penna

## 2011-03-19 ENCOUNTER — Emergency Department (HOSPITAL_COMMUNITY): Payer: Self-pay

## 2011-03-19 ENCOUNTER — Emergency Department (HOSPITAL_COMMUNITY)
Admission: EM | Admit: 2011-03-19 | Discharge: 2011-03-19 | Disposition: A | Discharge status: Home Health | Payer: Self-pay | Attending: Emergency Medicine | Admitting: Emergency Medicine

## 2011-03-19 ENCOUNTER — Telehealth: Payer: Self-pay

## 2011-03-19 ENCOUNTER — Other Ambulatory Visit: Payer: Self-pay

## 2011-03-19 ENCOUNTER — Encounter (HOSPITAL_COMMUNITY): Payer: Self-pay | Admitting: Emergency Medicine

## 2011-03-19 DIAGNOSIS — R0789 Other chest pain: Secondary | ICD-10-CM

## 2011-03-19 DIAGNOSIS — R079 Chest pain, unspecified: Secondary | ICD-10-CM | POA: Insufficient documentation

## 2011-03-19 DIAGNOSIS — R109 Unspecified abdominal pain: Secondary | ICD-10-CM | POA: Insufficient documentation

## 2011-03-19 LAB — URINALYSIS, ROUTINE W REFLEX MICROSCOPIC
Bilirubin Urine: NEGATIVE
Glucose, UA: NEGATIVE mg/dL
Ketones, ur: NEGATIVE mg/dL
Leukocytes, UA: NEGATIVE
Nitrite: NEGATIVE
Protein, ur: NEGATIVE mg/dL
Specific Gravity, Urine: 1.01 (ref 1.005–1.030)
Urobilinogen, UA: 0.2 mg/dL (ref 0.0–1.0)
pH: 5.5 (ref 5.0–8.0)

## 2011-03-19 LAB — CBC
HCT: 36.6 % (ref 36.0–46.0)
Hemoglobin: 12.5 g/dL (ref 12.0–15.0)
MCH: 33.3 pg (ref 26.0–34.0)
MCHC: 34.2 g/dL (ref 30.0–36.0)
MCV: 97.6 fL (ref 78.0–100.0)
Platelets: 372 10*3/uL (ref 150–400)
RBC: 3.75 MIL/uL — ABNORMAL LOW (ref 3.87–5.11)
RDW: 12.9 % (ref 11.5–15.5)
WBC: 10.8 10*3/uL — ABNORMAL HIGH (ref 4.0–10.5)

## 2011-03-19 LAB — URINE MICROSCOPIC-ADD ON

## 2011-03-19 LAB — BASIC METABOLIC PANEL
BUN: 23 mg/dL (ref 6–23)
CO2: 29 mEq/L (ref 19–32)
Calcium: 9.7 mg/dL (ref 8.4–10.5)
Chloride: 100 mEq/L (ref 96–112)
Creatinine, Ser: 0.68 mg/dL (ref 0.50–1.10)
GFR calc Af Amer: >90 mL/min (ref >90)
GFR calc non Af Amer: >90 mL/min (ref >90)
Glucose, Bld: 94 mg/dL (ref 70–99)
Potassium: 3.8 mEq/L (ref 3.5–5.1)
Sodium: 138 mEq/L (ref 135–145)

## 2011-03-19 LAB — CARDIAC PANEL(CRET KIN+CKTOT+MB+TROPI)
CK, MB: 0.9 ng/mL (ref 0.3–4.0)
Relative Index: INVALID (ref 0.0–2.5)
Total CK: 15 U/L (ref 7–177)
Troponin I: <0.3 ng/mL (ref <0.30)

## 2011-03-19 MED ORDER — IOHEXOL 350 MG/ML SOLN
100.0000 mL | Freq: Once | INTRAVENOUS | Status: AC | PRN
  Administered 2011-03-19: 100 mL via INTRAVENOUS

## 2011-03-19 MED ORDER — TRAMADOL HCL 50 MG PO TABS
100.0000 mg | ORAL_TABLET | Freq: Once | ORAL | Status: AC
  Administered 2011-03-19: 100 mg via ORAL
  Filled 2011-03-19: qty 2

## 2011-03-19 NOTE — ED Provider Notes (Signed)
7:49 PM   This is a 61 y.o. female who is s/p Aortobifemoral bypass graft on 02/26/11.   She complains of pain in her "left rib area." She states it is worse with deep inspiration and it began today. Apparently, was sent here for evaluation and to rule out PE. She's had no fevers or cough. No leg pain. She's had no problems with her incision.  Initial vital signs normal.  Patient is awake, alert, no acute distress.   rrr  cta b No calf tenderness   Initial workup initiated to include CT angiogram of the chest. Patient will be moved to the back or to the CDU to complete her evaluation.  Pascale Maves A. Patrica Duel, MD 03/19/11 1950

## 2011-03-19 NOTE — ED Notes (Signed)
Pt to xray and CT

## 2011-03-19 NOTE — Telephone Encounter (Signed)
Phone call from pt. C/o "sharp, stabbing pain" left lower rib cage that worsens when she takes a deep breath.  Denies shortness of breath, but, states she is deliberately taking short, shallow breaths, due to increased pain when she breathes deeper.  States pain started early this am.  Advised pt. to go to ER this evening, for evaluation, to rule-out possibility of blood clot, since she is only 3 weeks post-op AoBifemoral Bypass.  Pt. verbalized understanding.

## 2011-03-19 NOTE — ED Notes (Signed)
Pt c/o left rib pain worse with deep breath starting today; pt discharged 1 week ago after bypass; pt sent here for eval for possible PE

## 2011-03-19 NOTE — ED Provider Notes (Signed)
History     CSN: [id number]  Arrival date & time 03/19/11  1819   First MD Initiated Contact with Patient 03/19/11 2011      Chief Complaint  Patient presents with  . Flank Pain    (Consider location/radiation/quality/duration/timing/severity/associated sxs/prior treatment) HPI Patient presents today with left-sided chest/flank pain. She states it began around 3 AM. Pain is worse with movement and with deep breathing. Patient has had recent surgery with aortobifem bypass. She has been home and improving until this pain occurred. She states that she is not really short of breath it hurts to take a breath. She has not had cough or fever. She called her surgeon's office and was told to come to the emergency department for evaluation. She is not on any anticoagulants.  Past Medical History  Diagnosis Date  . Uterine cancer   . H/O: hysterectomy   . Asthma   . Hyperlipidemia   . History of lead poisoning 1977  . Shortness of breath   . Peripheral vascular disease     claudication  right greater than left  femoral artery  . Anxiety     Past Surgical History  Procedure Date  . Abdominal hysterectomy   . Abdominal angiogram   . Aorta - bilateral femoral artery bypass graft 02/26/2011    Procedure: AORTA BIFEMORAL BYPASS GRAFT;  Surgeon: Juleen China, MD;  Location: [facility] OR;  Service: Vascular;  Laterality: N/A;    Family History  Problem Relation Age of Onset  . Cancer Mother     History  Substance Use Topics  . Smoking status: Current Everyday Smoker -- 0.5 packs/day for 45 years    Types: Cigarettes  . Smokeless tobacco: Never Used  . Alcohol Use: No    OB History    Grav Para Term Preterm Abortions TAB SAB Ect Mult Living                  Review of Systems  All other systems reviewed and are negative.    Allergies  Review of patient's allergies indicates no known allergies.  Home Medications   Current Outpatient Rx  Name Route Sig Dispense Refill  .  ASPIRIN EC 81 MG PO TBEC Oral Take 81 mg by mouth daily.    . OMEGA-3 FATTY ACIDS 1000 MG PO CAPS Oral Take 2 g by mouth daily.     . IBUPROFEN 200 MG PO TABS Oral Take 400-600 mg by mouth every 6 (six) hours as needed. For pain    . ADULT MULTIVITAMIN W/MINERALS CH Oral Take 1 tablet by mouth daily.    . TRAMADOL HCL 50 MG PO TABS Oral Take 1 tablet (50 mg total) by mouth every 6 (six) hours as needed for pain. 20 tablet 0  . LORATADINE 5 MG/5ML PO SYRP Oral Take 10 mg by mouth daily as needed. For allergies      BP 99/55  Pulse 71  Temp(Src) 98.4 F (36.9 C) (Oral)  Resp 18  SpO2 100%  Physical Exam  Nursing note and vitals reviewed. Constitutional: She appears well-developed and well-nourished.  HENT:  Head: Normocephalic and atraumatic.  Right Ear: External ear normal.  Nose: Nose normal.  Mouth/Throat: Oropharynx is clear and moist.  Eyes: Conjunctivae are normal. Pupils are equal, round, and reactive to light.  Neck: Normal range of motion. Neck supple.  Cardiovascular: Normal rate, regular rhythm, normal heart sounds and intact distal pulses.   Pulmonary/Chest: Effort normal and breath sounds normal.  Abdominal: Soft. Bowel sounds are normal.    Musculoskeletal: Normal range of motion.  Neurological: She is alert. She has normal reflexes.  Skin: Skin is warm and dry.  Psychiatric: She has a normal mood and affect.    ED Course  Procedures (including critical care time)  Labs Reviewed  CBC - Abnormal; Notable for the following:    WBC 10.8 (*)    RBC 3.75 (*)    All other components within normal limits  CARDIAC PANEL(CRET KIN+CKTOT+MB+TROPI)  BASIC METABOLIC PANEL  URINALYSIS, ROUTINE W REFLEX MICROSCOPIC   No results found.   No diagnosis found.    MDM   Results for orders placed during the hospital encounter of 03/19/11  CARDIAC PANEL(CRET KIN+CKTOT+MB+TROPI)      Component Value Range   Total CK 15  7 - 177 (U/L)   CK, MB 0.9  0.3 - 4.0  (ng/mL)   Troponin I <0.30  <0.30 (ng/mL)   Relative Index RELATIVE INDEX IS INVALID  0.0 - 2.5   BASIC METABOLIC PANEL      Component Value Range   Sodium 138  135 - 145 (mEq/L)   Potassium 3.8  3.5 - 5.1 (mEq/L)   Chloride 100  96 - 112 (mEq/L)   CO2 29  19 - 32 (mEq/L)   Glucose, Bld 94  70 - 99 (mg/dL)   BUN 23  6 - 23 (mg/dL)   Creatinine, Ser 0.86  0.50 - 1.10 (mg/dL)   Calcium 9.7  8.4 - 57.8 (mg/dL)   GFR calc non Af Amer >90  >90 (mL/min)   GFR calc Af Amer >90  >90 (mL/min)  CBC      Component Value Range   WBC 10.8 (*) 4.0 - 10.5 (K/uL)   RBC 3.75 (*) 3.87 - 5.11 (MIL/uL)   Hemoglobin 12.5  12.0 - 15.0 (g/dL)   HCT 46.9  62.9 - 52.8 (%)   MCV 97.6  78.0 - 100.0 (fL)   MCH 33.3  26.0 - 34.0 (pg)   MCHC 34.2  30.0 - 36.0 (g/dL)   RDW 41.3  24.4 - 01.0 (%)   Platelets 372  150 - 400 (K/uL)   Patient without acute abnormality on CT angiogram or CT of the abdomen. Labs are normal. This appears to be musculoskeletal in nature. She is encouraged to use her pain medicine at home. She will be discharged        Hilario Quarry, MD 03/21/11 1312

## 2011-03-22 ENCOUNTER — Encounter: Payer: Self-pay | Admitting: Surgery

## 2011-03-25 ENCOUNTER — Encounter: Payer: Self-pay | Admitting: Surgery

## 2011-03-25 ENCOUNTER — Ambulatory Visit (INDEPENDENT_AMBULATORY_CARE_PROVIDER_SITE_OTHER): Payer: Self-pay | Admitting: Surgery

## 2011-03-25 ENCOUNTER — Ambulatory Visit (INDEPENDENT_AMBULATORY_CARE_PROVIDER_SITE_OTHER): Payer: Self-pay | Admitting: *Deleted

## 2011-03-25 VITALS — BP 94/68 | HR 74 | Resp 16 | Ht 64.0 in | Wt 128.0 lb

## 2011-03-25 DIAGNOSIS — I6529 Occlusion and stenosis of unspecified carotid artery: Secondary | ICD-10-CM

## 2011-03-25 DIAGNOSIS — I771 Stricture of artery: Secondary | ICD-10-CM

## 2011-03-25 NOTE — Progress Notes (Signed)
The patient is one month out from an aortobifemoral bypass graft. She has been seen 2 weeks ago for complaints of abdominal pain she ended up getting a PE protocol CT scan in the ER which was negative for PE. She is taking multiple medications for constipation including magnesium citrate with minimal results. She also complains of some left arm numbness which happens usually at night.  On examination her incisions are healed. There is no evidence of hernia. She has palpable pedal pulses.  Carotid Dopplers were performed today which showed bilateral increase. On the right she measures 60-79% on the left she measures 80-99%. She has a retrograde left vertebral artery and a 30 mm pressure gradient between her brachial arteries.  I'm giving her a prescription for MiraLAX to help with her constipation.  More than likely her left arm numbness is secondary to her left subclavian disease. I do not think that this represents a TIA from her right carotid stenosis. At this point the patient is not willing to entertain an operation until she is  fully recovered from her aortobifemoral bypass graft. For that reason I will have her come back to see me in one month for f/u. She may need to undergo left carotid and left subclavian revascularization at that time.

## 2011-04-01 NOTE — Procedures (Unsigned)
CAROTID DUPLEX EXAM  INDICATION:  Carotid stenosis, followup left subclavian stenosis  HISTORY: Diabetes:  no Cardiac:  no Hypertension:  no Smoking:  quit Previous Surgery:  Aorto to bifemoral bypass graft 02/26/2011 CV History:  Patient complains of numbness of her left arm. Amaurosis Fugax No, Paresthesias No, Hemiparesis No                                      RIGHT             LEFT Brachial systolic pressure:         122               90 Brachial Doppler waveforms:         Triphasic         Monophasic Vertebral direction of flow:        Antegrade         Retrograde DUPLEX VELOCITIES (cm/sec) CCA peak systolic                   79                86 ECA peak systolic                   157               137 ICA peak systolic                   230               350 ICA end diastolic                   78                117 PLAQUE MORPHOLOGY:                  Mixed, irregular  Mixed, irregular PLAQUE AMOUNT:                      Moderate          Moderate to severe PLAQUE LOCATION:                    Bifurcation ICA and ECA             Bifurcation ICA and ECA  IMPRESSION:  60% to 79% right ICA stenosis.   80% to 99% left ICA stenosis.   Increased velocities bilaterally since prior study of 09/24/2010.  Known retrograde left vertebral artery.  ___________________________________________ V. Charlena Cross, MD  SS/MEDQ  D:  03/25/2011  T:  03/25/2011  Job:  [id number]

## 2011-04-08 ENCOUNTER — Inpatient Hospital Stay (HOSPITAL_COMMUNITY): Payer: Medicaid Other

## 2011-04-08 ENCOUNTER — Inpatient Hospital Stay (HOSPITAL_COMMUNITY)
Admission: EM | Admit: 2011-04-08 | Discharge: 2011-04-21 | DRG: 336 | Disposition: A | Discharge status: Home Health | Payer: Medicaid Other | Attending: General Surgery | Admitting: General Surgery

## 2011-04-08 ENCOUNTER — Emergency Department (HOSPITAL_COMMUNITY): Payer: Medicaid Other

## 2011-04-08 ENCOUNTER — Other Ambulatory Visit: Payer: Self-pay

## 2011-04-08 ENCOUNTER — Encounter (HOSPITAL_COMMUNITY): Payer: Self-pay

## 2011-04-08 DIAGNOSIS — Z7982 Long term (current) use of aspirin: Secondary | ICD-10-CM

## 2011-04-08 DIAGNOSIS — F411 Generalized anxiety disorder: Secondary | ICD-10-CM | POA: Diagnosis present

## 2011-04-08 DIAGNOSIS — K56600 Partial intestinal obstruction, unspecified as to cause: Secondary | ICD-10-CM

## 2011-04-08 DIAGNOSIS — K551 Chronic vascular disorders of intestine: Secondary | ICD-10-CM | POA: Diagnosis present

## 2011-04-08 DIAGNOSIS — Y834 Other reconstructive surgery as the cause of abnormal reaction of the patient, or of later complication, without mention of misadventure at the time of the procedure: Secondary | ICD-10-CM | POA: Diagnosis not present

## 2011-04-08 DIAGNOSIS — Z87891 Personal history of nicotine dependence: Secondary | ICD-10-CM

## 2011-04-08 DIAGNOSIS — K560 Paralytic ileus: Secondary | ICD-10-CM | POA: Diagnosis not present

## 2011-04-08 DIAGNOSIS — E876 Hypokalemia: Secondary | ICD-10-CM | POA: Diagnosis present

## 2011-04-08 DIAGNOSIS — Z79899 Other long term (current) drug therapy: Secondary | ICD-10-CM

## 2011-04-08 DIAGNOSIS — I70219 Atherosclerosis of native arteries of extremities with intermittent claudication, unspecified extremity: Secondary | ICD-10-CM | POA: Diagnosis present

## 2011-04-08 DIAGNOSIS — Y9210 Unspecified residential institution as the place of occurrence of the external cause: Secondary | ICD-10-CM | POA: Diagnosis not present

## 2011-04-08 DIAGNOSIS — K567 Ileus, unspecified: Secondary | ICD-10-CM | POA: Diagnosis present

## 2011-04-08 DIAGNOSIS — J45909 Unspecified asthma, uncomplicated: Secondary | ICD-10-CM | POA: Diagnosis present

## 2011-04-08 DIAGNOSIS — R109 Unspecified abdominal pain: Secondary | ICD-10-CM | POA: Diagnosis present

## 2011-04-08 DIAGNOSIS — K929 Disease of digestive system, unspecified: Secondary | ICD-10-CM | POA: Diagnosis not present

## 2011-04-08 DIAGNOSIS — E7800 Pure hypercholesterolemia, unspecified: Secondary | ICD-10-CM | POA: Diagnosis present

## 2011-04-08 DIAGNOSIS — Z8542 Personal history of malignant neoplasm of other parts of uterus: Secondary | ICD-10-CM

## 2011-04-08 DIAGNOSIS — I739 Peripheral vascular disease, unspecified: Secondary | ICD-10-CM | POA: Diagnosis present

## 2011-04-08 DIAGNOSIS — I6529 Occlusion and stenosis of unspecified carotid artery: Secondary | ICD-10-CM | POA: Diagnosis present

## 2011-04-08 DIAGNOSIS — E785 Hyperlipidemia, unspecified: Secondary | ICD-10-CM | POA: Diagnosis present

## 2011-04-08 DIAGNOSIS — K565 Intestinal adhesions [bands], unspecified as to partial versus complete obstruction: Principal | ICD-10-CM | POA: Diagnosis present

## 2011-04-08 HISTORY — DX: Partial intestinal obstruction, unspecified as to cause: K56.600

## 2011-04-08 HISTORY — DX: Peripheral vascular disease, unspecified: I73.9

## 2011-04-08 HISTORY — DX: Encounter for other specified aftercare: Z51.89

## 2011-04-08 HISTORY — DX: Reserved for inherently not codable concepts without codable children: IMO0001

## 2011-04-08 HISTORY — DX: Angina pectoris, unspecified: I20.9

## 2011-04-08 LAB — DIFFERENTIAL
Basophils Absolute: 0 10*3/uL (ref 0.0–0.1)
Basophils Relative: 0 % (ref 0–1)
Eosinophils Absolute: 0.2 10*3/uL (ref 0.0–0.7)
Eosinophils Relative: 2 % (ref 0–5)
Lymphocytes Relative: 21 % (ref 12–46)
Lymphs Abs: 1.7 10*3/uL (ref 0.7–4.0)
Monocytes Absolute: 0.4 10*3/uL (ref 0.1–1.0)
Monocytes Relative: 5 % (ref 3–12)
Neutro Abs: 6 10*3/uL (ref 1.7–7.7)
Neutrophils Relative %: 72 % (ref 43–77)

## 2011-04-08 LAB — CBC
HCT: 37.7 % (ref 36.0–46.0)
HCT: 35.2 % — ABNORMAL LOW (ref 36.0–46.0)
Hemoglobin: 12.7 g/dL (ref 12.0–15.0)
Hemoglobin: 12.2 g/dL (ref 12.0–15.0)
MCH: 32.3 pg (ref 26.0–34.0)
MCH: 32.8 pg (ref 26.0–34.0)
MCHC: 33.7 g/dL (ref 30.0–36.0)
MCHC: 34.7 g/dL (ref 30.0–36.0)
MCV: 95.9 fL (ref 78.0–100.0)
MCV: 94.6 fL (ref 78.0–100.0)
Platelets: 236 10*3/uL (ref 150–400)
Platelets: 193 10*3/uL (ref 150–400)
RBC: 3.93 MIL/uL (ref 3.87–5.11)
RBC: 3.72 MIL/uL — ABNORMAL LOW (ref 3.87–5.11)
RDW: 12.9 % (ref 11.5–15.5)
RDW: 12.9 % (ref 11.5–15.5)
WBC: 8.3 10*3/uL (ref 4.0–10.5)
WBC: 13.3 10*3/uL — ABNORMAL HIGH (ref 4.0–10.5)

## 2011-04-08 LAB — COMPREHENSIVE METABOLIC PANEL
ALT: 23 U/L (ref 0–35)
AST: 19 U/L (ref 0–37)
Albumin: 3.6 g/dL (ref 3.5–5.2)
Alkaline Phosphatase: 82 U/L (ref 39–117)
BUN: 18 mg/dL (ref 6–23)
CO2: 26 mEq/L (ref 19–32)
Calcium: 9.7 mg/dL (ref 8.4–10.5)
Chloride: 98 mEq/L (ref 96–112)
Creatinine, Ser: 0.63 mg/dL (ref 0.50–1.10)
GFR calc Af Amer: >90 mL/min (ref >90)
GFR calc non Af Amer: >90 mL/min (ref >90)
Glucose, Bld: 122 mg/dL — ABNORMAL HIGH (ref 70–99)
Potassium: 3.4 mEq/L — ABNORMAL LOW (ref 3.5–5.1)
Sodium: 136 mEq/L (ref 135–145)
Total Bilirubin: 0.4 mg/dL (ref 0.3–1.2)
Total Protein: 6.8 g/dL (ref 6.0–8.3)

## 2011-04-08 LAB — CARDIAC PANEL(CRET KIN+CKTOT+MB+TROPI)
CK, MB: 1.3 ng/mL (ref 0.3–4.0)
Relative Index: INVALID (ref 0.0–2.5)
Total CK: 18 U/L (ref 7–177)
Troponin I: <0.3 ng/mL (ref <0.30)

## 2011-04-08 LAB — MAGNESIUM: Magnesium: 1.7 mg/dL (ref 1.5–2.5)

## 2011-04-08 LAB — LIPASE, BLOOD: Lipase: 45 U/L (ref 11–59)

## 2011-04-08 LAB — CREATININE, SERUM
Creatinine, Ser: 0.6 mg/dL (ref 0.50–1.10)
GFR calc Af Amer: >90 mL/min (ref >90)
GFR calc non Af Amer: >90 mL/min (ref >90)

## 2011-04-08 LAB — PHOSPHORUS: Phosphorus: 4.2 mg/dL (ref 2.3–4.6)

## 2011-04-08 LAB — TROPONIN I: Troponin I: <0.3 ng/mL (ref <0.30)

## 2011-04-08 MED ORDER — MORPHINE SULFATE 4 MG/ML IJ SOLN
4.0000 mg | Freq: Once | INTRAMUSCULAR | Status: AC
  Administered 2011-04-08: 4 mg via INTRAVENOUS
  Filled 2011-04-08: qty 1

## 2011-04-08 MED ORDER — ACETAMINOPHEN 325 MG PO TABS: 650.0000 mg | ORAL_TABLET | Freq: Four times a day (QID) | ORAL | Status: DC | PRN

## 2011-04-08 MED ORDER — POTASSIUM CHLORIDE 10 MEQ/100ML IV SOLN
10.0000 meq | Freq: Once | INTRAVENOUS | Status: AC
  Administered 2011-04-08: 10 meq via INTRAVENOUS
  Filled 2011-04-08: qty 100

## 2011-04-08 MED ORDER — ONDANSETRON HCL 4 MG/2ML IJ SOLN
4.0000 mg | Freq: Four times a day (QID) | INTRAMUSCULAR | Status: DC | PRN
  Administered 2011-04-09: 4 mg via INTRAVENOUS
  Filled 2011-04-08 (×2): qty 2

## 2011-04-08 MED ORDER — ONDANSETRON HCL 4 MG/2ML IJ SOLN
4.0000 mg | Freq: Once | INTRAMUSCULAR | Status: AC
  Administered 2011-04-08: 4 mg via INTRAVENOUS
  Filled 2011-04-08: qty 2

## 2011-04-08 MED ORDER — METRONIDAZOLE IN NACL 5-0.79 MG/ML-% IV SOLN
500.0000 mg | Freq: Three times a day (TID) | INTRAVENOUS | Status: DC
  Administered 2011-04-08 – 2011-04-10 (×5): 500 mg via INTRAVENOUS
  Filled 2011-04-08 (×8): qty 100

## 2011-04-08 MED ORDER — ACETAMINOPHEN 650 MG RE SUPP: 650.0000 mg | Freq: Four times a day (QID) | RECTAL | Status: DC | PRN

## 2011-04-08 MED ORDER — PANTOPRAZOLE SODIUM 40 MG IV SOLR
40.0000 mg | INTRAVENOUS | Status: DC
  Administered 2011-04-08 – 2011-04-17 (×10): 40 mg via INTRAVENOUS
  Filled 2011-04-08 (×13): qty 40

## 2011-04-08 MED ORDER — CIPROFLOXACIN IN D5W 400 MG/200ML IV SOLN
400.0000 mg | Freq: Two times a day (BID) | INTRAVENOUS | Status: DC
  Administered 2011-04-08 – 2011-04-10 (×4): 400 mg via INTRAVENOUS
  Filled 2011-04-08 (×5): qty 200

## 2011-04-08 MED ORDER — ENOXAPARIN SODIUM 40 MG/0.4ML ~~LOC~~ SOLN
40.0000 mg | SUBCUTANEOUS | Status: DC
  Filled 2011-04-08 (×2): qty 0.4

## 2011-04-08 MED ORDER — IOHEXOL 350 MG/ML SOLN
100.0000 mL | Freq: Once | INTRAVENOUS | Status: AC | PRN
  Administered 2011-04-08: 100 mL via INTRAVENOUS

## 2011-04-08 MED ORDER — SODIUM CHLORIDE 0.9 % IV BOLUS (SEPSIS)
1000.0000 mL | Freq: Once | INTRAVENOUS | Status: AC
  Administered 2011-04-08: 1000 mL via INTRAVENOUS

## 2011-04-08 MED ORDER — HYDROMORPHONE HCL PF 1 MG/ML IJ SOLN
1.0000 mg | Freq: Once | INTRAMUSCULAR | Status: AC
  Administered 2011-04-08: 1 mg via INTRAVENOUS
  Filled 2011-04-08: qty 1

## 2011-04-08 MED ORDER — LORAZEPAM 2 MG/ML IJ SOLN
1.0000 mg | Freq: Once | INTRAMUSCULAR | Status: AC
  Administered 2011-04-08: 1 mg via INTRAVENOUS
  Filled 2011-04-08: qty 1

## 2011-04-08 MED ORDER — MORPHINE SULFATE 4 MG/ML IJ SOLN
4.0000 mg | INTRAMUSCULAR | Status: DC | PRN
  Administered 2011-04-08 – 2011-04-10 (×9): 4 mg via INTRAVENOUS
  Filled 2011-04-08 (×9): qty 1

## 2011-04-08 MED ORDER — SODIUM CHLORIDE 0.9 % IV SOLN
INTRAVENOUS | Status: AC
  Administered 2011-04-08: 19:00:00 via INTRAVENOUS

## 2011-04-08 MED ORDER — ONDANSETRON HCL 4 MG PO TABS: 4.0000 mg | ORAL_TABLET | Freq: Four times a day (QID) | ORAL | Status: DC | PRN

## 2011-04-08 MED ORDER — KCL IN DEXTROSE-NACL 20-5-0.9 MEQ/L-%-% IV SOLN
INTRAVENOUS | Status: DC
  Administered 2011-04-08 – 2011-04-10 (×3): via INTRAVENOUS
  Filled 2011-04-08 (×7): qty 1000

## 2011-04-08 NOTE — ED Provider Notes (Signed)
4:10 PM Were received from Dr. Jeraldine Loots. Patient will be admitted to CDU awaiting CT of abdomen and pelvis to check the patency of an aortic graft. Patient was having epigastric abdominal pain that started around 12 noon. She was given Ativan and Dilaudid for pain. Patient will go home if her CT is negative.  1900  CT shows partial small bowel obstruction.  Will admit per hospitalists team 4. Dr. Venetia Constable.  Jethro Bastos, NP 04/08/11 [phone]

## 2011-04-08 NOTE — ED Notes (Signed)
Unable to move pt to CDU. CDU RNs stated that unit was full.

## 2011-04-08 NOTE — ED Notes (Signed)
NG tube 46fr placed by [facility], RN. gastric contents returned.

## 2011-04-08 NOTE — ED Provider Notes (Signed)
History     CSN: [id number]  Arrival date & time 04/08/11  1255   First MD Initiated Contact with Patient 04/08/11 1336      Chief Complaint  Patient presents with  . Chest Pain    HPI The patient presents with abdominal pain.  Notably, the patient has a recent history of aortal femoral bypass graft.  She generally has been well since that procedure.  Today, approximately 2 hours ago the patient suddenly developed pain focally about the periumbilical area.  The pain is sharp, worse with motion, not improved with home medication.  She also notes nausea, vomiting.  No diarrhea.  No distal dysesthesia.  No syncope, no chest pain, no dyspnea. Notably, the patient presented several weeks ago for pain and had angiography, the demonstrated patent grafts. Past Medical History  Diagnosis Date  . Uterine cancer   . H/O: hysterectomy   . Asthma   . Hyperlipidemia   . History of lead poisoning 1977  . Shortness of breath   . Peripheral vascular disease     claudication  right greater than left  femoral artery  . Anxiety     Past Surgical History  Procedure Date  . Abdominal hysterectomy   . Abdominal angiogram   . Aorta - bilateral femoral artery bypass graft 02/26/2011    Procedure: AORTA BIFEMORAL BYPASS GRAFT;  Surgeon: Juleen China, MD;  Location: [facility] OR;  Service: Vascular;  Laterality: N/A;  . Pr vein bypass graft,aorto-fem-pop 02/26/11    Family History  Problem Relation Age of Onset  . Cancer Mother     History  Substance Use Topics  . Smoking status: Current Everyday Smoker -- 0.5 packs/day for 45 years    Types: Cigarettes  . Smokeless tobacco: Never Used  . Alcohol Use: No    OB History    Grav Para Term Preterm Abortions TAB SAB Ect Mult Living                  Review of Systems  All other systems reviewed and are negative.    Allergies  Review of patient's allergies indicates no known allergies.  Home Medications   Current Outpatient Rx  Name Route  Sig Dispense Refill  . ACETAMINOPHEN 500 MG PO TABS Oral Take 500 mg by mouth every 6 (six) hours as needed.    . ASPIRIN EC 81 MG PO TBEC Oral Take 81 mg by mouth daily.    . OMEGA-3 FATTY ACIDS 1000 MG PO CAPS Oral Take 2 g by mouth daily.     . IBUPROFEN 200 MG PO TABS Oral Take 400-600 mg by mouth every 6 (six) hours as needed. For pain    . LORATADINE 5 MG/5ML PO SYRP Oral Take 10 mg by mouth daily as needed. For allergies    . ADULT MULTIVITAMIN W/MINERALS CH Oral Take 1 tablet by mouth daily.      BP 193/74  Pulse 72  Temp(Src) 97.4 F (36.3 C) (Oral)  Resp 16  SpO2 100%  Physical Exam  Nursing note and vitals reviewed. Constitutional: She is oriented to person, place, and time. She appears well-developed and well-nourished. No distress.  HENT:  Head: Normocephalic and atraumatic.  Eyes: Conjunctivae and EOM are normal.  Cardiovascular: Normal rate and regular rhythm.   Pulmonary/Chest: Effort normal and breath sounds normal. No stridor. No respiratory distress.  Abdominal: Normal appearance. She exhibits no distension.    Musculoskeletal: She exhibits no edema.  Neurological: She is  alert and oriented to person, place, and time. No cranial nerve deficit.  Skin: Skin is warm and dry.  Psychiatric: She has a normal mood and affect.    ED Course  Procedures (including critical care time)   Labs Reviewed  CBC  DIFFERENTIAL  COMPREHENSIVE METABOLIC PANEL  LIPASE, BLOOD  TROPONIN I   No results found.   No diagnosis found.  Cardiac monitor 75 sinus rhythm normal Pulse oximetry 99% room air normal   MDM  This F presents several months following an aorto-fem bypass w acute onset abdominal pain.  On initial exam the patient is doubled over, screaming.  Given the patient's extreme presentation, there was concern for the patency of her graft. Notably, the patient's initial VS were reassuring, in spite of her presentation.  I discussed appropriate imaging techniques  with radiology, and the patient had CT angio abd/pelv.  Following provision of analgesics and ativan the patient improved substantially (and did not have any HD decompensation).  The patient was transferred to CDU to await CT.  Gerhard Munch   (Late note addendum)  On review, the patient' CT demonstrated SBO, and she was admitted for further E/M.  Gerhard Munch, MD 04/09/11 [phone]

## 2011-04-08 NOTE — ED Notes (Signed)
Pt asleep in stretcher. Pt awoke and said medicine helped a lot.

## 2011-04-08 NOTE — ED Notes (Signed)
Pt complains ofepigastric pain onset 35 mins ago radiaitng to back

## 2011-04-08 NOTE — ED Notes (Signed)
Pt c/o abd pain radiating to back, worse with movement and palpation. Pt yelling and screaming.

## 2011-04-08 NOTE — ED Notes (Signed)
Pt muttering under breath "it's gotta come out, everything...has to come out. This pain has to come out, yes Lord thank you lord this has to come out." on repeat

## 2011-04-08 NOTE — H&P (Signed)
PCP:   Eustace Moore, MD, MD   Chief Complaint:  Abdominal and back pain for 2 days.  HPI: Ms Kimberly [name] was referred for admission for abdominal pain and concern for partial small bowel obstruction. Patient somewhat sedated and giving limited history at the time of my evaluation. She states that she has had pains all over, particularly the belly, lower chest, and back. He admits to nausea but no vomiting. Her bowel movements have been irregular and she had to use laxatives since her Aortofemoral bypass surgery  6 weeks ago. Her last bowel movement was this morning. She denies diarrhea, cough, fever or dysuria. She says her pain is worse when she takes a deep breath. A ct angiogram of the abdomen and pelvis showed narrowing of the celiac and SMA, as well as significant stenosis of the origin of the right renal artery, which have been seen in previous studies. It also showed distended small bowel loops in the lower abdomen and decompressed distal small bowel loops with edema within the mesentery(developing sbo versus an inflammatory process). Labs showed some hypokalemmia of 3.4. Patient reports little relief with interventions in ED, including decompression of the stomach with an NGT(500 cc of greenish material suctioned at insertion). She says the pain is not better.  Review of Systems:  The patient denies anorexia, fever, weight loss,, vision loss, decreased hearing, hoarseness, chest pain, syncope, dyspnea on exertion, peripheral edema, balance deficits, hemoptysis, abdominal pain, melena, hematochezia, severe indigestion/heartburn, hematuria, incontinence, genital sores, muscle weakness, suspicious skin lesions, transient blindness, difficulty walking, depression, unusual weight change, abnormal bleeding, enlarged lymph nodes, angioedema, and breast masses.  Past Medical History: Past Medical History  Diagnosis Date  . Uterine cancer   . H/O: hysterectomy   . Asthma   . Hyperlipidemia   . History  of lead poisoning 1977  . Shortness of breath   . Peripheral vascular disease     claudication  right greater than left  femoral artery  . Anxiety    Past Surgical History  Procedure Date  . Abdominal hysterectomy   . Abdominal angiogram   . Aorta - bilateral femoral artery bypass graft 02/26/2011    Procedure: AORTA BIFEMORAL BYPASS GRAFT;  Surgeon: Juleen China, MD;  Location: [facility] OR;  Service: Vascular;  Laterality: N/A;  . Pr vein bypass graft,aorto-fem-pop 02/26/11    Medications: Prior to Admission medications   Medication Sig Start Date End Date Taking? Authorizing Provider  acetaminophen (TYLENOL) 500 MG tablet Take 500 mg by mouth every 6 (six) hours as needed.   Yes Historical Provider, MD  aspirin EC 81 MG tablet Take 81 mg by mouth daily.   Yes Historical Provider, MD  fish oil-omega-3 fatty acids 1000 MG capsule Take 2 g by mouth daily.    Yes Historical Provider, MD  ibuprofen (ADVIL,MOTRIN) 200 MG tablet Take 400-600 mg by mouth every 6 (six) hours as needed. For pain   Yes Historical Provider, MD  loratadine (CLARITIN) 5 MG/5ML syrup Take 10 mg by mouth daily as needed. For allergies   Yes Historical Provider, MD  Multiple Vitamin (MULITIVITAMIN WITH MINERALS) TABS Take 1 tablet by mouth daily.   Yes Historical Provider, MD    Allergies:  No Known Allergies  Social History:  reports that she has been smoking Cigarettes.  She has a 22.5 pack-year smoking history. She has never used smokeless tobacco. She reports that she does not drink alcohol or use illicit drugs.   Family History: Family History  Problem Relation Age of Onset  . Cancer Mother     Physical Exam: Filed Vitals:   04/08/11 1830 04/08/11 1845 04/08/11 1900 04/08/11 1959  BP: 156/80 170/78 146/68   Pulse: 77 84 81   Temp:      TempSrc:      Resp: 17 29 23    Height:    5\' 4"  (1.626 m)  Weight:    60.555 kg (133 lb 8 oz)  SpO2: 99% 100% 96%    Ill looking. Seems in pain. NGT in place. No  jvd. Good air entry bilaterally with no rhonchi or rales. No wheezing. S1S2 heard no murmurs. RRR. Abdomen- scaphoid, healing surgical scar. Tender to light palpation, diffuse. Reduced bowel sounds. Exam limited by pain. CNS- grossly intact. Extremities- no pedal edema.   Labs on Admission:   [facility] 04/08/11 1343  NA 136  K 3.4*  CL 98  CO2 26  GLUCOSE 122*  BUN 18  CREATININE 0.63  CALCIUM 9.7  MG --  PHOS --    Basename 04/08/11 1343  AST 19  ALT 23  ALKPHOS 82  BILITOT 0.4  PROT 6.8  ALBUMIN 3.6    Basename 04/08/11 1343  LIPASE 45  AMYLASE --    Basename 04/08/11 1343  WBC 8.3  NEUTROABS 6.0  HGB 12.7  HCT 37.7  MCV 95.9  PLT 236    Basename 04/08/11 1345  CKTOTAL --  CKMB --  CKMBINDEX --  TROPONINI <0.30   No results found for this basename: TSH,T4TOTAL,FREET3,T3FREE,THYROIDAB in the last 72 hours No results found for this basename: VITAMINB12:2,FOLATE:2,FERRITIN:2,TIBC:2,IRON:2,RETICCTPCT:2 in the last 72 hours  Radiological Exams on Admission: Dg Chest 2 View  03/19/2011  *RADIOLOGY REPORT*  Clinical Data:  Left-sided chest pain.  CHEST - 2 VIEW  Comparison: 02/27/2011  Findings: The heart size and mediastinal contours are within normal limits.  Both lungs are clear.  The visualized skeletal structures are unremarkable.  IMPRESSION: No active disease.  Original Report Authenticated By: Reola Calkins, M.D.   Ct Angio Chest W/cm &/or Wo Cm  03/19/2011  *RADIOLOGY REPORT*  Clinical Data:  Chest pain.  Abdominal pain.  CT ANGIOGRAPHY CHEST WITH CONTRAST  Technique:  Multidetector CT imaging of the chest was performed using the standard protocol during bolus administration of intravenous contrast.  Multiplanar CT image reconstructions including MIPs were obtained to evaluate the vascular anatomy.  Contrast: OMNIPAQUE IOHEXOL 350 MG/ML IV SOLN  Comparison:  07/28/2010  Findings:  There are no filling defects in the pulmonary arterial tree to  suggest acute pulmonary thromboembolism.  Negative abnormal mediastinal adenopathy.  No pericardial effusion.  No pneumothorax and no pleural effusion  Clear lungs.  No acute bony deformity.  Review of the MIP images confirms the above findings.  IMPRESSION: No evidence of acute pulmonary thromboembolism.  CT ABDOMEN AND PELVIS WITH CONTRAST  Technique:  Multidetector CT imaging of the abdomen and pelvis was performed using the standard protocol following bolus administration of intravenous contrast.  Contrast: OMNIPAQUE IOHEXOL 350 MG/ML IV SOLN  Comparison:  02/05/2011  Findings:  Aorto bifemoral bypass graft repair has been performed. Both limbs of the graft opacify and are patent.  The aortic portion is patent.  The proximal anastomosis is patent.  Narrowing at the origin of the celiac and SMA is redemonstrated.  Liver is unremarkable.  Gallbladder is decompressed.  Spleen, adrenal glands, pancreas are stable in appearance.  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  Postoperative changes in the abdominal wall and bilateral inguinal regions are noted.  No evidence of abscess.  No free intraperitoneal gas.  Bladder is within normal limits.  Uterus is absent.  IMPRESSION: Postoperative changes from aorto bifemoral bypass graft are noted. No obvious complication.  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  Original Report Authenticated By: Donavan Burnet, M.D.   Ct Abdomen Pelvis W Contrast  03/19/2011  *RADIOLOGY REPORT*  Clinical Data:  Chest pain.  Abdominal pain.  CT ANGIOGRAPHY CHEST WITH CONTRAST  Technique:  Multidetector CT imaging of the chest was performed using the standard protocol during bolus administration of intravenous contrast.  Multiplanar CT image reconstructions including MIPs were obtained to evaluate the vascular anatomy.  Contrast: OMNIPAQUE IOHEXOL 350 MG/ML IV SOLN  Comparison:  07/28/2010  Findings:  There are no filling defects in the pulmonary  arterial tree to suggest acute pulmonary thromboembolism.  Negative abnormal mediastinal adenopathy.  No pericardial effusion.  No pneumothorax and no pleural effusion  Clear lungs.  No acute bony deformity.  Review of the MIP images confirms the above findings.  IMPRESSION: No evidence of acute pulmonary thromboembolism.  CT ABDOMEN AND PELVIS WITH CONTRAST  Technique:  Multidetector CT imaging of the abdomen and pelvis was performed using the standard protocol following bolus administration of intravenous contrast.  Contrast: OMNIPAQUE IOHEXOL 350 MG/ML IV SOLN  Comparison:  02/05/2011  Findings:  Aorto bifemoral bypass graft repair has been performed. Both limbs of the graft opacify and are patent.  The aortic portion is patent.  The proximal anastomosis is patent.  Narrowing at the origin of the celiac and SMA is redemonstrated.  Liver is unremarkable.  Gallbladder is decompressed.  Spleen, adrenal glands, pancreas are stable in appearance.  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  Postoperative changes in the abdominal wall and bilateral inguinal regions are noted.  No evidence of abscess.  No free intraperitoneal gas.  Bladder is within normal limits.  Uterus is absent.  IMPRESSION: Postoperative changes from aorto bifemoral bypass graft are noted. No obvious complication.  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  Original Report Authenticated By: Donavan Burnet, M.D.   Ct Angio Abd/pel W/ And/or W/o  04/08/2011  *RADIOLOGY REPORT*  Clinical Data:  Epigastric pain  CT ANGIOGRAPHY ABDOMEN AND PELVIS  Technique:  Multidetector CT imaging of the abdomen and pelvis was performed using the standard protocol during bolus administration of intravenous contrast.  Multiplanar reconstructed images including MIPs were obtained and reviewed to evaluate the vascular anatomy.  Contrast:   100 ml Omnipaque 350  Comparison:  03/19/2011  Findings:  Aorto bifemoral bypass graft is patent.   Common iliac arteries are occluded.  Internal and external iliac arteries reconstitute.  There is mild narrowing of the right common femoral artery beyond the distal anastomosis.   There is at least 60% narrowing just beyond the origin of the SMA. There is approximately 50% narrowing at the origin of the celiac axis.  IMA branches reconstitute has its origin has been occluded by the bypass graft.  There is no evidence of contrast extravasation to suggest leak or pseudoaneurysm formation.  The left renal artery is widely patent.  There is significant narrowing at the origin of the right renal artery which is critical.  The prior study was not protocoled for arterial imaging and narrowing at the celiac and SMA was not clearly demonstrated.  Heterogeneous opacities at the dependent aspect of  both lower lobes.  Liver, gallbladder, spleen, pancreas are within normal limits.  The common bile duct is markedly dilated the pancreatic duct is normal in caliber, but this is a stable finding.  No obvious pancreatic head mass.  There is moderate free fluid in the pelvis.  Lack of oral contrast limits evaluation of bowel.  No loculated fluid collection to suggest abscess. Gas and fluid filled small bowel loops in the lower abdomen are mildly distended air-fluid levels.  Distal small bowel is decompressed. Within the adjacent mesentery, there is fluid density and stranding.  Bladder is unremarkable.  Uterus is absent.   Adrenal glands are within normal limits.   Review of the MIP images confirms the above findings.  IMPRESSION: Aorto bifemoral bypass graft is patent without evidence of complication.  There is narrowing at the origin of the celiac and SMA.  These findings can be associated with chronic mesenteric ischemia.  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  Nonspecific free fluid in the pelvis.  There are distended small bowel loops in the lower abdomen and decompressed the distal small bowel loops.  This is associated with edema within the mesentery.  Developing small bowel obstruction or an inflammatory process may be present.  Original Report Authenticated By: Donavan Burnet, M.D.    Assessment Ms Kimberly [name] presents with pleuritic abdominal pain, 6 weeks after aortofemoral bypass, and tells me she will have another an endarterectomy soon. She  has suggestion of mesenteric inflammation which could be causing her pain, and contributing to ileus, compounded by slight hypokalemia. This process may be ischemia related, although there is a remote possibility of an infectious process. Plan .Abdominal pain/Ileus/PVD (peripheral vascular disease)/Mesenteric artery stenosis/Renal artery stenosis- will admit med surg. IVF/pain meds/continue bowel relief and rest. Correct potassium/rehydrate/ will cover empirically with abx, although suspicion for infection low, and will have low thresh hold to d/c abx/ consult vvs tomorrow. Needs close monitoring of her BP and may need antihypertensives. She needs to stay away from tobacco, and she says she quit q week before her surgery. Dvt/gi prophylaxis. Condition guarded.   Kimberly [name] [phone]. 04/08/2011, 8:14 PM

## 2011-04-09 ENCOUNTER — Inpatient Hospital Stay (HOSPITAL_COMMUNITY): Payer: Medicaid Other

## 2011-04-09 ENCOUNTER — Encounter (HOSPITAL_COMMUNITY): Payer: Self-pay | Admitting: General Surgery

## 2011-04-09 DIAGNOSIS — R109 Unspecified abdominal pain: Secondary | ICD-10-CM

## 2011-04-09 LAB — COMPREHENSIVE METABOLIC PANEL
ALT: 17 U/L (ref 0–35)
AST: 14 U/L (ref 0–37)
Albumin: 3.1 g/dL — ABNORMAL LOW (ref 3.5–5.2)
Alkaline Phosphatase: 68 U/L (ref 39–117)
BUN: 11 mg/dL (ref 6–23)
CO2: 25 mEq/L (ref 19–32)
Calcium: 8.7 mg/dL (ref 8.4–10.5)
Chloride: 102 mEq/L (ref 96–112)
Creatinine, Ser: 0.57 mg/dL (ref 0.50–1.10)
GFR calc Af Amer: >90 mL/min (ref >90)
GFR calc non Af Amer: >90 mL/min (ref >90)
Glucose, Bld: 139 mg/dL — ABNORMAL HIGH (ref 70–99)
Potassium: 3.9 mEq/L (ref 3.5–5.1)
Sodium: 136 mEq/L (ref 135–145)
Total Bilirubin: 0.4 mg/dL (ref 0.3–1.2)
Total Protein: 5.8 g/dL — ABNORMAL LOW (ref 6.0–8.3)

## 2011-04-09 LAB — CBC
HCT: 35.7 % — ABNORMAL LOW (ref 36.0–46.0)
Hemoglobin: 12.3 g/dL (ref 12.0–15.0)
MCH: 33.2 pg (ref 26.0–34.0)
MCHC: 34.5 g/dL (ref 30.0–36.0)
MCV: 96.2 fL (ref 78.0–100.0)
Platelets: 217 10*3/uL (ref 150–400)
RBC: 3.71 MIL/uL — ABNORMAL LOW (ref 3.87–5.11)
RDW: 13 % (ref 11.5–15.5)
WBC: 11.7 10*3/uL — ABNORMAL HIGH (ref 4.0–10.5)

## 2011-04-09 LAB — APTT: aPTT: 23 seconds — ABNORMAL LOW (ref 24–37)

## 2011-04-09 LAB — PROTIME-INR
INR: 0.99 (ref 0.00–1.49)
Prothrombin Time: 13.3 seconds (ref 11.6–15.2)

## 2011-04-09 LAB — TSH: TSH: 3.625 u[IU]/mL (ref 0.350–4.500)

## 2011-04-09 LAB — MAGNESIUM: Magnesium: 1.7 mg/dL (ref 1.5–2.5)

## 2011-04-09 MED ORDER — ENOXAPARIN SODIUM 40 MG/0.4ML ~~LOC~~ SOLN: 40.0000 mg | SUBCUTANEOUS | Status: DC

## 2011-04-09 MED ORDER — ENOXAPARIN SODIUM 40 MG/0.4ML ~~LOC~~ SOLN
40.0000 mg | Freq: Once | SUBCUTANEOUS | Status: DC
  Filled 2011-04-09: qty 0.4

## 2011-04-09 NOTE — Consult Note (Signed)
[facility] CONSULT NOTE 04/09/2011 [id number]  CC: Abdominal pain S/P Aortobifem bypass 02/26/11 Referring Physician: Maretta Bees, MD  History of Present Illness: Kimberly [name] is a 61 y.o. female who had an Aortobifemoral bypass in mid February. No adhesions were noted in abdomen per Dr. Estanislado Spire  OP note. She was seen by Dr. Myra Gianotti in F/U and C/O LLQ discomfort for which he prescribed Miralax. Pt began having normal BM and this discomfort resolved until 04/08/11 when she noted acute generalized abdominal, lower chest an back pain. She was seen in the ER and CT of abd/pelvis showed SBO. An NGT was placed but the Pt. Has not had any relief from this. She describes the pain as " all over my stomach". She denies fever, sweats or chills but states she has been Hot and Cold at home. She states she has been having regular BM at home. Denies Nausea or vomiting or bloody, odorous stools.   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  Imaging: 03/19/11 CT Scan : No SBO Postoperative changes from aorto bifemoral bypass graft are noted.  No obvious complication.  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  04/08/11 CTA ABD/PELVIS: IMPRESSION:  Aorto bifemoral bypass graft is patent without evidence of  complication.  There is narrowing at the origin of the celiac and SMA. These  findings can be associated with chronic mesenteric ischemia.  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  Nonspecific free fluid in the pelvis.  There are distended small bowel loops in the lower abdomen and  decompressed the distal small bowel loops. This is associated with  edema within the mesentery. Developing small bowel obstruction or  an inflammatory process may be present  04/09/11 KUB IMPRESSION:  Persistent dilatation of small bowel loops in the left lower  quadrant and pelvis, small bowel obstruction  not excluded.  No interval change.   Past Medical History  Diagnosis Date  . Uterine cancer   . Asthma   . Hyperlipidemia   . History of lead poisoning 1977  . Shortness of breath   . Peripheral vascular disease     claudication  right greater than left  femoral artery  . Anxiety   . Angina   . Blood transfusion   . Migraine   . Subclavian artery disease     left  . Small bowel obstruction, partial 04/08/11    Past Surgical History  Procedure Date  . Abdominal hysterectomy   . Abdominal angiogram   . Aorta - bilateral femoral artery bypass graft 02/26/2011    Procedure: AORTA BIFEMORAL BYPASS GRAFT;  Surgeon: Juleen China, MD;  Location: [facility] OR;  Service: Vascular;  Laterality: N/A;  . Pr vein bypass graft,aorto-fem-pop 02/26/11     ROS: [x]  Positive  [ ]  Denies    General: [ ]  Weight loss, [ ]  Fever, [ ]  chills Neurologic: [ ]  Dizziness, [ ]  Blackouts, [ ]  Seizure [ ]  Stroke, [ ]  "Mini stroke", [ ]  Slurred speech, [ ]  Temporary blindness; [ ]  weakness in arms or legs, [ ]  Hoarseness Cardiac: [ ]  Chest pain/pressure, [ ]  Shortness of breath at rest [ ]  Shortness of breath with exertion, [ ]  Atrial fibrillation or irregular heartbeat Vascular: [ ]  Pain in legs with walking, [ ]  Pain in legs at rest, [ ]  Pain in legs at night,  [ ]  Non-healing ulcer, [ ]   Blood clot in vein/DVT,   Pulmonary: [ ]  Home oxygen, [ ]  Productive cough, [ ]  Coughing up blood, [ ]  Asthma,  [ ]  Wheezing Musculoskeletal:  [ ]  Arthritis, [ ]  Low back pain, [ ]  Joint pain Hematologic: [ ]  Easy Bruising, [ ]  Anemia; [ ]  Hepatitis Gastrointestinal: [ ]  Blood in stool, [ ]  Gastroesophageal Reflux/heartburn, [ ]  Trouble swallowing [x]  abdominal pain Urinary: [ ]  chronic Kidney disease, [ ]  on HD - [ ]  MWF or [ ]  TTHS, [ ]  Burning with urination, [ ]  Difficulty urinating Skin: [ ]  Rashes, [ ]  Wounds Psychological: [ ]  Anxiety, [ ]  Depression  Social History History  Substance Use Topics  . Smoking  status: Former Smoker -- 0.5 packs/day for 45 years    Types: Cigarettes    Quit date: 02/24/2011  . Smokeless tobacco: Never Used  . Alcohol Use: No    Family History Family History  Problem Relation Age of Onset  . Cancer Mother     No Known Allergies  Current Facility-Administered Medications  Medication Dose Route Frequency Provider Last Rate Last Dose  . 0.9 %  sodium chloride infusion   Intravenous STAT Jethro Bastos, NP 50 mL/hr at 04/08/11 1910    . acetaminophen (TYLENOL) tablet 650 mg  650 mg Oral Q6H PRN Simbiso Ranga, MD       Or  . acetaminophen (TYLENOL) suppository 650 mg  650 mg Rectal Q6H PRN Simbiso Ranga, MD      . ciprofloxacin (CIPRO) IVPB 400 mg  400 mg Intravenous Q12H Simbiso Ranga, MD   400 mg at 04/09/11 0927  . dextrose 5 % and 0.9 % NaCl with KCl 20 mEq/L infusion   Intravenous Continuous Simbiso Ranga, MD 100 mL/hr at 04/09/11 0932    . enoxaparin (LOVENOX) injection 40 mg  40 mg Subcutaneous Q24H Simbiso Ranga, MD      . HYDROmorphone (DILAUDID) injection 1 mg  1 mg Intravenous Once Gerhard Munch, MD   1 mg at 04/08/11 1354  . iohexol (OMNIPAQUE) 350 MG/ML injection 100 mL  100 mL Intravenous Once PRN Simbiso Ranga, MD   100 mL at 04/08/11 1648  . LORazepam (ATIVAN) injection 1 mg  1 mg Intravenous Once Gerhard Munch, MD   1 mg at 04/08/11 1419  . metroNIDAZOLE (FLAGYL) IVPB 500 mg  500 mg Intravenous Q8H Simbiso Ranga, MD   500 mg at 04/09/11 1610  . morphine 4 MG/ML injection 4 mg  4 mg Intravenous Once Jethro Bastos, NP   4 mg at 04/08/11 1843  . morphine 4 MG/ML injection 4 mg  4 mg Intravenous Q3H PRN Simbiso Ranga, MD   4 mg at 04/09/11 0718  . ondansetron (ZOFRAN) injection 4 mg  4 mg Intravenous Once Gerhard Munch, MD   4 mg at 04/08/11 1354  . ondansetron (ZOFRAN) tablet 4 mg  4 mg Oral Q6H PRN Simbiso Ranga, MD       Or  . ondansetron (ZOFRAN) injection 4 mg  4 mg Intravenous Q6H PRN Simbiso Ranga, MD      . pantoprazole (PROTONIX)  injection 40 mg  40 mg Intravenous Q24H Simbiso Ranga, MD   40 mg at 04/08/11 2138  . potassium chloride 10 mEq in 100 mL IVPB  10 mEq Intravenous Once Jethro Bastos, NP   10 mEq at 04/08/11 1900  . sodium chloride 0.9 % bolus 1,000 mL  1,000 mL Intravenous Once Gerhard Munch, MD   1,000 mL  at 04/08/11 1354     Imaging: Significant Diagnostic Studies: CBC Lab Results  Component Value Date   WBC 11.7* 04/09/2011   HGB 12.3 04/09/2011   HCT 35.7* 04/09/2011   MCV 96.2 04/09/2011   PLT 217 04/09/2011    BMET    Component Value Date/Time   NA 136 04/09/2011 0553   K 3.9 04/09/2011 0553   CL 102 04/09/2011 0553   CO2 25 04/09/2011 0553   GLUCOSE 139* 04/09/2011 0553   BUN 11 04/09/2011 0553   CREATININE 0.57 04/09/2011 0553   CALCIUM 8.7 04/09/2011 0553   GFRNONAA >90 04/09/2011 0553   GFRAA >90 04/09/2011 0553    COAG Lab Results  Component Value Date   INR 0.99 04/09/2011   INR 1.08 02/26/2011   INR 0.95 02/25/2011   No results found for this basename: PTT     Physical Examination  Patient Vitals for the past 24 hrs:  BP Temp Temp src Pulse Resp SpO2 Height Weight  04/09/11 0559 169/77 mmHg 98 F (36.7 C) Oral 86  18  98 % - -  04/08/11 2237 136/71 mmHg 98 F (36.7 C) Oral 84  20  95 % - -  04/08/11 2009 - - - - - 96 % - -  04/08/11 1959 - - - - - - 5\' 4"  (1.626 m) 133 lb 8 oz (60.555 kg)  04/08/11 1900 146/68 mmHg - - 81  23  96 % - -  04/08/11 1845 170/78 mmHg - - 84  29  100 % - -  04/08/11 1830 156/80 mmHg - - 77  17  99 % - -  04/08/11 1815 136/82 mmHg - - 79  23  97 % - -  04/08/11 1736 101/73 mmHg - - 76  19  97 % - -  04/08/11 1546 134/60 mmHg - - 82  16  100 % - -  04/08/11 1300 193/74 mmHg 97.4 F (36.3 C) Oral 72  16  100 % - -   Pulse Readings from Last 3 Encounters:  04/09/11 86  03/25/11 74  03/19/11 78    General:  WDWN in NAD HENT: WNL Eyes: Pupils equal Pulmonary: normal non-labored breathing , without Rales, rhonchi,  wheezing Cardiac: RRR, without   Murmurs Abdomen: soft, midline incision well healed. generalized tenderness in abdomen to light palpation although seems more tender in RUQ Skin: no rashes, ulcers noted Vascular Exam/Pulses:Palpable DP/PT pulses bilaterally Extremities without ischemic changes, no Gangrene , no cellulitis; no open wounds;  Musculoskeletal: no muscle wasting or atrophy  Neurologic: A&O X 3; Appropriate Affect ;  SENSATION: normal; MOTOR FUNCTION:  moving all extremities equally.  Speech is fluent/normal  Non-Invasive Vascular Imaging: Carotid Dopplers were performed 03/25/11 which showed  Right ICA stenosis of 60-79% with  80-99% left ICA stenosis. She has a retrograde left vertebral artery and a 30 mm pressure gradient between her brachial arteries.  Pt alos has severe left subclavian stenosis  ASSESSMENT: Abdominal pain and SBO with edematous Small Bowel loop per CT ;poss adhesions vs ischemic bowel in this 61 yo female who is S/P aortobifemoral bypass. 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 WBC 11K and pt is afebrile - No signs of graft infection   PLAN: Cont NGT.  Per [facility]- if no improvement in 24 hours will need exploratory Lap Will continue to follow with you.  Della Goo, [facility]  Agree  with above. The patient had been doing well except for some constipation. She recently started MiraLAX and this helped her symptoms considerably. Late yesterday morning she developed fairly sudden acute onset of generalized abdominal pain. She has not had any significant nausea or vomiting. As above, CT scan shows some distended small bowel loops in the lower abdomen with some associated edema in the mesentery. Lateral projection of aorta shows some mild narrowing of the celiac and SMA but this did not appear to be critical. Patient now with NG tube. Appreciate [facility]'s help. Continue to follow exam.  Di Kindle. Edilia Bo, MD,  FACS Beeper [phone] 04/09/2011

## 2011-04-09 NOTE — ED Provider Notes (Signed)
Please see my initial note.  The patient was sent to CDU to await CT.  CT results were c/w SBO and the patient was admitted for further E/M.    Gerhard Munch, MD 04/09/11 [phone]

## 2011-04-09 NOTE — Consult Note (Signed)
Kimberly [name] [birth year]  [id number].   Requesting MD: Dr. Jeoffrey Massed Chief Complaint/Reason for Consult: abdominal pain, PSBO HPI: This is a 61 yo female with PVD who just underwent an aortobifemoral bypass on 02-26-11 this year for severe vascular disease.  She was also found to have severe carotid artery disease and is expecting to get bilateral carotid endarterectomies soon.  She has been doing well since her surgery with the exception of constipation.  She was put on Miralax for this and has been doing well with no other issues.  Yesterday, very acutely, she developed severe, crampy, labor-like pains diffusely in her abdomen.  She had mild nausea but no emesis.  She had a BM yesterday, but no flatus or BM since then.  Secondary to severe pain, she presented to [facility] where she had a CTA completed.  See below for details.  There was some mesenteric edema and a question of a PSBO.  She was admitted and an NGT was placed.  Today she continues to have pretty severe pain.  We have been asked to see the patient for further recommendations.  Review of Systems: Please see HPI, otherwise all other systems have been reviewed and are negative.  Her numbness and pain in her LEs is gone since her recent vascular procedure.   Family History  Problem Relation Age of Onset  . Cancer Mother     Past Medical History  Diagnosis Date  . Uterine cancer   . Asthma   . Hyperlipidemia   . History of lead poisoning 1977  . Shortness of breath   . Peripheral vascular disease     claudication  right greater than left  femoral artery  . Anxiety   . Angina   . Blood transfusion   . Migraine   . Subclavian artery disease     left  . Small bowel obstruction, partial 04/08/11    Past Surgical History  Procedure Date  . Abdominal hysterectomy   . Abdominal angiogram   . Aorta - bilateral femoral artery bypass graft 02/26/2011    Procedure: AORTA BIFEMORAL BYPASS GRAFT;  Surgeon: Juleen China, MD;   Location: [facility] OR;  Service: Vascular;  Laterality: N/A;  . Pr vein bypass graft,aorto-fem-pop 02/26/11    Social History:  reports that she quit smoking about 6 weeks ago. Her smoking use included Cigarettes. She has a 22.5 pack-year smoking history. She has never used smokeless tobacco. She reports that she uses illicit drugs. She reports that she does not drink alcohol.  Allergies: No Known Allergies  Medications Prior to Admission  Medication Dose Route Frequency Provider Last Rate Last Dose  . 0.9 %  sodium chloride infusion   Intravenous STAT Jethro Bastos, NP 50 mL/hr at 04/08/11 1910    . acetaminophen (TYLENOL) tablet 650 mg  650 mg Oral Q6H PRN Simbiso Ranga, MD       Or  . acetaminophen (TYLENOL) suppository 650 mg  650 mg Rectal Q6H PRN Simbiso Ranga, MD      . ciprofloxacin (CIPRO) IVPB 400 mg  400 mg Intravenous Q12H Simbiso Ranga, MD   400 mg at 04/09/11 0927  . dextrose 5 % and 0.9 % NaCl with KCl 20 mEq/L infusion   Intravenous Continuous Letha Cape, PA 100 mL/hr at 04/09/11 0932    . enoxaparin (LOVENOX) injection 40 mg  40 mg Subcutaneous Q24H Letha Cape, PA      . HYDROmorphone (DILAUDID) injection 1 mg  1  mg Intravenous Once Gerhard Munch, MD   1 mg at 04/08/11 1354  . iohexol (OMNIPAQUE) 350 MG/ML injection 100 mL  100 mL Intravenous Once PRN Simbiso Ranga, MD   100 mL at 04/08/11 1648  . LORazepam (ATIVAN) injection 1 mg  1 mg Intravenous Once Gerhard Munch, MD   1 mg at 04/08/11 1419  . metroNIDAZOLE (FLAGYL) IVPB 500 mg  500 mg Intravenous Q8H Simbiso Ranga, MD   500 mg at 04/09/11 1610  . morphine 4 MG/ML injection 4 mg  4 mg Intravenous Once Jethro Bastos, NP   4 mg at 04/08/11 1843  . morphine 4 MG/ML injection 4 mg  4 mg Intravenous Q3H PRN Simbiso Ranga, MD   4 mg at 04/09/11 0718  . ondansetron (ZOFRAN) injection 4 mg  4 mg Intravenous Once Gerhard Munch, MD   4 mg at 04/08/11 1354  . ondansetron (ZOFRAN) tablet 4 mg  4 mg Oral Q6H PRN Simbiso  Ranga, MD       Or  . ondansetron (ZOFRAN) injection 4 mg  4 mg Intravenous Q6H PRN Simbiso Ranga, MD      . pantoprazole (PROTONIX) injection 40 mg  40 mg Intravenous Q24H Simbiso Ranga, MD   40 mg at 04/08/11 2138  . potassium chloride 10 mEq in 100 mL IVPB  10 mEq Intravenous Once Jethro Bastos, NP   10 mEq at 04/08/11 1900  . sodium chloride 0.9 % bolus 1,000 mL  1,000 mL Intravenous Once Gerhard Munch, MD   1,000 mL at 04/08/11 1354  . DISCONTD: enoxaparin (LOVENOX) injection 40 mg  40 mg Subcutaneous Q24H Simbiso Ranga, MD       Medications Prior to Admission  Medication Sig Dispense Refill  . acetaminophen (TYLENOL) 500 MG tablet Take 500 mg by mouth every 6 (six) hours as needed.      Marland Kitchen aspirin EC 81 MG tablet Take 81 mg by mouth daily.      . fish oil-omega-3 fatty acids 1000 MG capsule Take 2 g by mouth daily.       Marland Kitchen ibuprofen (ADVIL,MOTRIN) 200 MG tablet Take 400-600 mg by mouth every 6 (six) hours as needed. For pain      . loratadine (CLARITIN) 5 MG/5ML syrup Take 10 mg by mouth daily as needed. For allergies      . Multiple Vitamin (MULITIVITAMIN WITH MINERALS) TABS Take 1 tablet by mouth daily.        Blood pressure 169/77, pulse 86, temperature 98 F (36.7 C), temperature source Oral, resp. rate 18, height 5\' 4"  (1.626 m), weight 133 lb 8 oz (60.555 kg), SpO2 98.00%. Physical Exam: General: pleasant, WD, WN white female who is laying in bed in mild distress HEENT: head is normocephalic, atraumatic.  Sclera are noninjected.  PERRL.  Ears and nose without any masses or lesions.  She does have an NGT in her right nare with minimal output.  Mouth is pink. Heart: regular, rate, and rhythm.  Normal s1,s2. No obvious murmurs, gallops, or rubs noted.  Palpable radial and pedal pulses bilaterally Lungs: CTAB, no wheezes, rhonchi, or rales noted.  Respiratory effort nonlabored Abd: soft, diffusely tender, but greatest in RLQ.  She does have some voluntary guarding.  She may be  developing some early peritoneal signs, BS are hypoactive.  ND.  Well healing midline incision.  Right inguinal scar noted as well.  no masses, hernias, or organomegaly MS: all 4 extremities are symmetrical with no cyanosis, clubbing, or  edema. Skin: warm and dry with no masses, lesions, or rashes Psych: A&Ox3 with an appropriate affect, but does appear frightened and concerned.       Results for orders placed during the hospital encounter of 04/08/11 (from the past 48 hour(s))  CBC     Status: Normal   Collection Time   04/08/11  1:43 PM      Component Value Range Comment   WBC 8.3  4.0 - 10.5 (K/uL)    RBC 3.93  3.87 - 5.11 (MIL/uL)    Hemoglobin 12.7  12.0 - 15.0 (g/dL)    HCT 29.5  62.1 - 30.8 (%)    MCV 95.9  78.0 - 100.0 (fL)    MCH 32.3  26.0 - 34.0 (pg)    MCHC 33.7  30.0 - 36.0 (g/dL)    RDW 65.7  84.6 - 96.2 (%)    Platelets 236  150 - 400 (K/uL)   DIFFERENTIAL     Status: Normal   Collection Time   04/08/11  1:43 PM      Component Value Range Comment   Neutrophils Relative 72  43 - 77 (%)    Neutro Abs 6.0  1.7 - 7.7 (K/uL)    Lymphocytes Relative 21  12 - 46 (%)    Lymphs Abs 1.7  0.7 - 4.0 (K/uL)    Monocytes Relative 5  3 - 12 (%)    Monocytes Absolute 0.4  0.1 - 1.0 (K/uL)    Eosinophils Relative 2  0 - 5 (%)    Eosinophils Absolute 0.2  0.0 - 0.7 (K/uL)    Basophils Relative 0  0 - 1 (%)    Basophils Absolute 0.0  0.0 - 0.1 (K/uL)   COMPREHENSIVE METABOLIC PANEL     Status: Abnormal   Collection Time   04/08/11  1:43 PM      Component Value Range Comment   Sodium 136  135 - 145 (mEq/L)    Potassium 3.4 (*) 3.5 - 5.1 (mEq/L)    Chloride 98  96 - 112 (mEq/L)    CO2 26  19 - 32 (mEq/L)    Glucose, Bld 122 (*) 70 - 99 (mg/dL)    BUN 18  6 - 23 (mg/dL)    Creatinine, Ser 9.52  0.50 - 1.10 (mg/dL)    Calcium 9.7  8.4 - 10.5 (mg/dL)    Total Protein 6.8  6.0 - 8.3 (g/dL)    Albumin 3.6  3.5 - 5.2 (g/dL)    AST 19  0 - 37 (U/L)    ALT 23  0 - 35 (U/L)     Alkaline Phosphatase 82  39 - 117 (U/L)    Total Bilirubin 0.4  0.3 - 1.2 (mg/dL)    GFR calc non Af Amer >90  >90 (mL/min)    GFR calc Af Amer >90  >90 (mL/min)   LIPASE, BLOOD     Status: Normal   Collection Time   04/08/11  1:43 PM      Component Value Range Comment   Lipase 45  11 - 59 (U/L)   TROPONIN I     Status: Normal   Collection Time   04/08/11  1:45 PM      Component Value Range Comment   Troponin I <0.30  <0.30 (ng/mL)   CBC     Status: Abnormal   Collection Time   04/08/11  8:15 PM      Component Value Range Comment  WBC 13.3 (*) 4.0 - 10.5 (K/uL)    RBC 3.72 (*) 3.87 - 5.11 (MIL/uL)    Hemoglobin 12.2  12.0 - 15.0 (g/dL)    HCT 16.1 (*) 09.6 - 46.0 (%)    MCV 94.6  78.0 - 100.0 (fL)    MCH 32.8  26.0 - 34.0 (pg)    MCHC 34.7  30.0 - 36.0 (g/dL)    RDW 04.5  40.9 - 81.1 (%)    Platelets 193  150 - 400 (K/uL)   CREATININE, SERUM     Status: Normal   Collection Time   04/08/11  8:15 PM      Component Value Range Comment   Creatinine, Ser 0.60  0.50 - 1.10 (mg/dL)    GFR calc non Af Amer >90  >90 (mL/min)    GFR calc Af Amer >90  >90 (mL/min)   MAGNESIUM     Status: Normal   Collection Time   04/08/11  8:15 PM      Component Value Range Comment   Magnesium 1.7  1.5 - 2.5 (mg/dL)   PHOSPHORUS     Status: Normal   Collection Time   04/08/11  8:15 PM      Component Value Range Comment   Phosphorus 4.2  2.3 - 4.6 (mg/dL)   TSH     Status: Normal   Collection Time   04/08/11  8:15 PM      Component Value Range Comment   TSH 3.625  0.350 - 4.500 (uIU/mL)   CARDIAC PANEL(CRET KIN+CKTOT+MB+TROPI)     Status: Normal   Collection Time   04/08/11  8:15 PM      Component Value Range Comment   Total CK 18  7 - 177 (U/L)    CK, MB 1.3  0.3 - 4.0 (ng/mL)    Troponin I <0.30  <0.30 (ng/mL)    Relative Index RELATIVE INDEX IS INVALID  0.0 - 2.5    PROTIME-INR     Status: Normal   Collection Time   04/09/11  5:53 AM      Component Value Range Comment   Prothrombin Time 13.3  11.6  - 15.2 (seconds)    INR 0.99  0.00 - 1.49    APTT     Status: Abnormal   Collection Time   04/09/11  5:53 AM      Component Value Range Comment   aPTT 23 (*) 24 - 37 (seconds)   CBC     Status: Abnormal   Collection Time   04/09/11  5:53 AM      Component Value Range Comment   WBC 11.7 (*) 4.0 - 10.5 (K/uL)    RBC 3.71 (*) 3.87 - 5.11 (MIL/uL)    Hemoglobin 12.3  12.0 - 15.0 (g/dL)    HCT 91.4 (*) 78.2 - 46.0 (%)    MCV 96.2  78.0 - 100.0 (fL)    MCH 33.2  26.0 - 34.0 (pg)    MCHC 34.5  30.0 - 36.0 (g/dL)    RDW 95.6  21.3 - 08.6 (%)    Platelets 217  150 - 400 (K/uL)   COMPREHENSIVE METABOLIC PANEL     Status: Abnormal   Collection Time   04/09/11  5:53 AM      Component Value Range Comment   Sodium 136  135 - 145 (mEq/L)    Potassium 3.9  3.5 - 5.1 (mEq/L)    Chloride 102  96 - 112 (mEq/L)    CO2  25  19 - 32 (mEq/L)    Glucose, Bld 139 (*) 70 - 99 (mg/dL)    BUN 11  6 - 23 (mg/dL)    Creatinine, Ser 4.09  0.50 - 1.10 (mg/dL)    Calcium 8.7  8.4 - 10.5 (mg/dL)    Total Protein 5.8 (*) 6.0 - 8.3 (g/dL)    Albumin 3.1 (*) 3.5 - 5.2 (g/dL)    AST 14  0 - 37 (U/L)    ALT 17  0 - 35 (U/L)    Alkaline Phosphatase 68  39 - 117 (U/L)    Total Bilirubin 0.4  0.3 - 1.2 (mg/dL)    GFR calc non Af Amer >90  >90 (mL/min)    GFR calc Af Amer >90  >90 (mL/min)   MAGNESIUM     Status: Normal   Collection Time   04/09/11  5:53 AM      Component Value Range Comment   Magnesium 1.7  1.5 - 2.5 (mg/dL)    Abd 1 View (kub)  08/07/1912  *RADIOLOGY REPORT*  Clinical Data: Abdominal stent surgery 6 weeks ago, abdominal tenderness, pain particular left upper quadrant and upper abdomen into back, abnormal CT  ABDOMEN - 1 VIEW  Comparison: CT abdomen and pelvis 04/08/2011  Findings: Persistent dilatation of small bowel loops in the left lower quadrant pelvis. No definite bowel wall thickening. Nasogastric tube coiled in stomach. Remaining bowel gas pattern unremarkable. Excreted contrast within urinary  bladder. No acute osseous findings.  IMPRESSION: Persistent dilatation of small bowel loops in the left lower quadrant and pelvis, small bowel obstruction not excluded. No interval change.  Original Report Authenticated By: Lollie Marrow, M.D.   Portable Chest 1 View  04/08/2011  *RADIOLOGY REPORT*  Clinical Data: Chest pain.  PORTABLE CHEST - 1 VIEW  Comparison: CT chest 03/19/2011.  Findings: The NG tube is coursing down the esophagus and into the stomach.  The cardiac silhouette, mediastinal and hilar contours are within normal limits and stable.  The lungs are clear.  No infiltrate or effusion.  Minimal streaky basilar atelectasis.  IMPRESSION:  1.  The NG tube enters the stomach. 2.  Streaky bibasilar atelectasis but no infiltrates or effusions.  Original Report Authenticated By: P. Loralie Champagne, M.D.   Ct Angio Abd/pel W/ And/or W/o  04/08/2011  *RADIOLOGY REPORT*  Clinical Data:  Epigastric pain  CT ANGIOGRAPHY ABDOMEN AND PELVIS  Technique:  Multidetector CT imaging of the abdomen and pelvis was performed using the standard protocol during bolus administration of intravenous contrast.  Multiplanar reconstructed images including MIPs were obtained and reviewed to evaluate the vascular anatomy.  Contrast:   100 ml Omnipaque 350  Comparison:  03/19/2011  Findings:  Aorto bifemoral bypass graft is patent.  Common iliac arteries are occluded.  Internal and external iliac arteries reconstitute.  There is mild narrowing of the right common femoral artery beyond the distal anastomosis.   There is at least 60% narrowing just beyond the origin of the SMA. There is approximately 50% narrowing at the origin of the celiac axis.  IMA branches reconstitute has its origin has been occluded by the bypass graft.  There is no evidence of contrast extravasation to suggest leak or pseudoaneurysm formation.  The left renal artery is widely patent.  There is significant narrowing at the origin of the right renal artery which  is critical.  The prior study was not protocoled for arterial imaging and narrowing at the celiac and SMA was not  clearly demonstrated.  Heterogeneous opacities at the dependent aspect of both lower lobes.  Liver, gallbladder, spleen, pancreas are within normal limits.  The common bile duct is markedly dilated the pancreatic duct is normal in caliber, but this is a stable finding.  No obvious pancreatic head mass.  There is moderate free fluid in the pelvis.  Lack of oral contrast limits evaluation of bowel.  No loculated fluid collection to suggest abscess. Gas and fluid filled small bowel loops in the lower abdomen are mildly distended air-fluid levels.  Distal small bowel is decompressed. Within the adjacent mesentery, there is fluid density and stranding.  Bladder is unremarkable.  Uterus is absent.   Adrenal glands are within normal limits.   Review of the MIP images confirms the above findings.  IMPRESSION: Aorto bifemoral bypass graft is patent without evidence of complication.  There is narrowing at the origin of the celiac and SMA.  These findings can be associated with chronic mesenteric ischemia.  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  Nonspecific free fluid in the pelvis.  There are distended small bowel loops in the lower abdomen and decompressed the distal small bowel loops. This is associated with edema within the mesentery.  Developing small bowel obstruction or an inflammatory process may be present.  Original Report Authenticated By: Donavan Burnet, M.D.       Assessment/Plan 1. Likely ischemic bowel secondary to vascular disease, can't rule out PSBO.  2. PVD Patient Active Problem List  Diagnoses  . TOBACCO ABUSE  . DENTAL PAIN  . MENOPAUSE, SURGICAL  . LYMPHADENOPATHY  . HYPERCHOLESTEROLEMIA  . INTERMITTENT CLAUDICATION  . CYSTITIS, ACUTE  . PAP SMEAR, LGSIL, ABNORMAL  . Subclavian artery stenosis  . PVD (peripheral vascular disease) with claudication    . Atherosclerosis of native arteries of the extremities with intermittent claudication  . Occlusion and stenosis of carotid artery without mention of cerebral infarction  . Abdominal pain  . Ileus  . PVD (peripheral vascular disease)  . Mesenteric artery stenosis   Plan: 1. The patient has fairly significant abdominal pain today.  She would like to hold off on surgery today as she would like to avoid if possible.  We are concerned about a portion of her bowel and relayed this to her.  We told her we would be willing to give her 24 hours of conservative management, but if her pain was not much better by tomorrow she would need surgical intervention.  She was agreeable to this.  We will increase her IVFs to 150cc/hr for hydration.  We will also get a CBC with diff in the morning.  We will follow closely with you.  Thank you for this consultation.  [Agree with above.  Her sudden onset and the CT scan suggests possible ischemic changes to a segment of small bowel.  I reviewed her CT scan with Dr. Vic Ripper.  Her SMA is patent, so this would be peripheral disease.  A less likely cause of pain and dilated SB would be a SBO secondary to adhesions.  The patients labs and vital signs are stable, so I think we can give her an additional 24 hours of IVF, NGT, and bowel rest.  But is she is no better tomorrow, she would be best served with exploration.  She is single and cares for 2 grandchildren.  OSBORNE,KELLY E 04/09/2011, 1:26 PM  Ovidio Kin, MD, [facility] Surgery Pager: [phone] Office phone:  [phone]

## 2011-04-09 NOTE — Progress Notes (Signed)
PATIENT DETAILS Name: Kimberly [name] Age: 61 y.o. Sex: female Date of Birth: May 17, 1950 Admit Date: 04/08/2011 ZOX:[id number],UJWJX L, MD, MD  Subjective: Still complaining of abdominal pain.  Objective: Vital signs in last 24 hours: Filed Vitals:   04/08/11 1959 04/08/11 2009 04/08/11 2237 04/09/11 0559  BP:   136/71 169/77  Pulse:   84 86  Temp:   98 F (36.7 C) 98 F (36.7 C)  TempSrc:   Oral Oral  Resp:   20 18  Height: 5\' 4"  (1.626 m)     Weight: 60.555 kg (133 lb 8 oz)     SpO2:  96% 95% 98%    Weight change:   Body mass index is 22.92 kg/(m^2).  Intake/Output from previous day:  Intake/Output Summary (Last 24 hours) at 04/09/11 1129 Last data filed at 04/09/11 0830  Gross per 24 hour  Intake      0 ml  Output    400 ml  Net   -400 ml    PHYSICAL EXAM: Gen Exam: Awake and alert with clear speech.  Neck: Supple, No JVD.   Chest: B/L Clear.   CVS: S1 S2 Regular, no murmurs.  Abdomen: soft, BS +, soft but diffusely tender all over-with voluntary gaurding Extremities: no edema, lower extremities warm to touch. Neurologic: Non Focal.   Skin: No Rash.   Wounds: N/A.    CONSULTS:  general surgery and vascular surgery  LAB RESULTS: CBC  Lab 04/09/11 0553 04/08/11 2015 04/08/11 1343  WBC 11.7* 13.3* 8.3  HGB 12.3 12.2 12.7  HCT 35.7* 35.2* 37.7  PLT 217 193 236  MCV 96.2 94.6 95.9  MCH 33.2 32.8 32.3  MCHC 34.5 34.7 33.7  RDW 13.0 12.9 12.9  LYMPHSABS -- -- 1.7  MONOABS -- -- 0.4  EOSABS -- -- 0.2  BASOSABS -- -- 0.0  BANDABS -- -- --    Chemistries   Lab 04/09/11 0553 04/08/11 2015 04/08/11 1343  NA 136 -- 136  K 3.9 -- 3.4*  CL 102 -- 98  CO2 25 -- 26  GLUCOSE 139* -- 122*  BUN 11 -- 18  CREATININE 0.57 0.60 0.63  CALCIUM 8.7 -- 9.7  MG 1.7 1.7 --    GFR Estimated Creatinine Clearance: 65.4 ml/min (by C-G formula based on Cr of 0.57).  Coagulation profile  Lab 04/09/11 0553  INR 0.99  PROTIME --    Cardiac Enzymes  Lab  04/08/11 2015 04/08/11 1345  CKMB 1.3 --  TROPONINI <0.30 <0.30  MYOGLOBIN -- --    No components found with this basename: POCBNP:3 No results found for this basename: DDIMER:2 in the last 72 hours No results found for this basename: HGBA1C:2 in the last 72 hours No results found for this basename: CHOL:2,HDL:2,LDLCALC:2,TRIG:2,CHOLHDL:2,LDLDIRECT:2 in the last 72 hours  Basename 04/08/11 2015  TSH 3.625  T4TOTAL --  T3FREE --  THYROIDAB --   No results found for this basename: VITAMINB12:2,FOLATE:2,FERRITIN:2,TIBC:2,IRON:2,RETICCTPCT:2 in the last 72 hours  Basename 04/08/11 1343  LIPASE 45  AMYLASE --    Urine Studies No results found for this basename: UACOL:2,UAPR:2,USPG:2,UPH:2,UTP:2,UGL:2,UKET:2,UBIL:2,UHGB:2,UNIT:2,UROB:2,ULEU:2,UEPI:2,UWBC:2,URBC:2,UBAC:2,CAST:2,CRYS:2,UCOM:2,BILUA:2 in the last 72 hours  MICROBIOLOGY: No results found for this or any previous visit (from the past 240 hour(s)).  RADIOLOGY STUDIES/RESULTS: Dg Chest 2 View  03/19/2011  *RADIOLOGY REPORT*  Clinical Data:  Left-sided chest pain.  CHEST - 2 VIEW  Comparison: 02/27/2011  Findings: The heart size and mediastinal contours are within normal limits.  Both lungs are clear.  The visualized  skeletal structures are unremarkable.  IMPRESSION: No active disease.  Original Report Authenticated By: Reola Calkins, M.D.   Abd 1 View (kub)  04/09/2011  *RADIOLOGY REPORT*  Clinical Data: Abdominal stent surgery 6 weeks ago, abdominal tenderness, pain particular left upper quadrant and upper abdomen into back, abnormal CT  ABDOMEN - 1 VIEW  Comparison: CT abdomen and pelvis 04/08/2011  Findings: Persistent dilatation of small bowel loops in the left lower quadrant pelvis. No definite bowel wall thickening. Nasogastric tube coiled in stomach. Remaining bowel gas pattern unremarkable. Excreted contrast within urinary bladder. No acute osseous findings.  IMPRESSION: Persistent dilatation of small bowel loops in  the left lower quadrant and pelvis, small bowel obstruction not excluded. No interval change.  Original Report Authenticated By: Lollie Marrow, M.D.   Ct Angio Chest W/cm &/or Wo Cm  03/19/2011  *RADIOLOGY REPORT*  Clinical Data:  Chest pain.  Abdominal pain.  CT ANGIOGRAPHY CHEST WITH CONTRAST  Technique:  Multidetector CT imaging of the chest was performed using the standard protocol during bolus administration of intravenous contrast.  Multiplanar CT image reconstructions including MIPs were obtained to evaluate the vascular anatomy.  Contrast: OMNIPAQUE IOHEXOL 350 MG/ML IV SOLN  Comparison:  07/28/2010  Findings:  There are no filling defects in the pulmonary arterial tree to suggest acute pulmonary thromboembolism.  Negative abnormal mediastinal adenopathy.  No pericardial effusion.  No pneumothorax and no pleural effusion  Clear lungs.  No acute bony deformity.  Review of the MIP images confirms the above findings.  IMPRESSION: No evidence of acute pulmonary thromboembolism.  CT ABDOMEN AND PELVIS WITH CONTRAST  Technique:  Multidetector CT imaging of the abdomen and pelvis was performed using the standard protocol following bolus administration of intravenous contrast.  Contrast: OMNIPAQUE IOHEXOL 350 MG/ML IV SOLN  Comparison:  02/05/2011  Findings:  Aorto bifemoral bypass graft repair has been performed. Both limbs of the graft opacify and are patent.  The aortic portion is patent.  The proximal anastomosis is patent.  Narrowing at the origin of the celiac and SMA is redemonstrated.  Liver is unremarkable.  Gallbladder is decompressed.  Spleen, adrenal glands, pancreas are stable in appearance.  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  Postoperative changes in the abdominal wall and bilateral inguinal regions are noted.  No evidence of abscess.  No free intraperitoneal gas.  Bladder is within normal limits.  Uterus is absent.  IMPRESSION: Postoperative changes from aorto  bifemoral bypass graft are noted. No obvious complication.  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  Original Report Authenticated By: Donavan Burnet, M.D.   Ct Abdomen Pelvis W Contrast  03/19/2011  *RADIOLOGY REPORT*  Clinical Data:  Chest pain.  Abdominal pain.  CT ANGIOGRAPHY CHEST WITH CONTRAST  Technique:  Multidetector CT imaging of the chest was performed using the standard protocol during bolus administration of intravenous contrast.  Multiplanar CT image reconstructions including MIPs were obtained to evaluate the vascular anatomy.  Contrast: OMNIPAQUE IOHEXOL 350 MG/ML IV SOLN  Comparison:  07/28/2010  Findings:  There are no filling defects in the pulmonary arterial tree to suggest acute pulmonary thromboembolism.  Negative abnormal mediastinal adenopathy.  No pericardial effusion.  No pneumothorax and no pleural effusion  Clear lungs.  No acute bony deformity.  Review of the MIP images confirms the above findings.  IMPRESSION: No evidence of acute pulmonary thromboembolism.  CT ABDOMEN AND PELVIS WITH CONTRAST  Technique:  Multidetector CT imaging  of the abdomen and pelvis was performed using the standard protocol following bolus administration of intravenous contrast.  Contrast: OMNIPAQUE IOHEXOL 350 MG/ML IV SOLN  Comparison:  02/05/2011  Findings:  Aorto bifemoral bypass graft repair has been performed. Both limbs of the graft opacify and are patent.  The aortic portion is patent.  The proximal anastomosis is patent.  Narrowing at the origin of the celiac and SMA is redemonstrated.  Liver is unremarkable.  Gallbladder is decompressed.  Spleen, adrenal glands, pancreas are stable in appearance.  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  Postoperative changes in the abdominal wall and bilateral inguinal regions are noted.  No evidence of abscess.  No free intraperitoneal gas.  Bladder is within normal limits.  Uterus is absent.  IMPRESSION: Postoperative  changes from aorto bifemoral bypass graft are noted. No obvious complication.  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  Original Report Authenticated By: Donavan Burnet, M.D.   Portable Chest 1 View  04/08/2011  *RADIOLOGY REPORT*  Clinical Data: Chest pain.  PORTABLE CHEST - 1 VIEW  Comparison: CT chest 03/19/2011.  Findings: The NG tube is coursing down the esophagus and into the stomach.  The cardiac silhouette, mediastinal and hilar contours are within normal limits and stable.  The lungs are clear.  No infiltrate or effusion.  Minimal streaky basilar atelectasis.  IMPRESSION:  1.  The NG tube enters the stomach. 2.  Streaky bibasilar atelectasis but no infiltrates or effusions.  Original Report Authenticated By: P. Loralie Champagne, M.D.   Ct Angio Abd/pel W/ And/or W/o  04/08/2011  *RADIOLOGY REPORT*  Clinical Data:  Epigastric pain  CT ANGIOGRAPHY ABDOMEN AND PELVIS  Technique:  Multidetector CT imaging of the abdomen and pelvis was performed using the standard protocol during bolus administration of intravenous contrast.  Multiplanar reconstructed images including MIPs were obtained and reviewed to evaluate the vascular anatomy.  Contrast:   100 ml Omnipaque 350  Comparison:  03/19/2011  Findings:  Aorto bifemoral bypass graft is patent.  Common iliac arteries are occluded.  Internal and external iliac arteries reconstitute.  There is mild narrowing of the right common femoral artery beyond the distal anastomosis.   There is at least 60% narrowing just beyond the origin of the SMA. There is approximately 50% narrowing at the origin of the celiac axis.  IMA branches reconstitute has its origin has been occluded by the bypass graft.  There is no evidence of contrast extravasation to suggest leak or pseudoaneurysm formation.  The left renal artery is widely patent.  There is significant narrowing at the origin of the right renal artery which is critical.  The prior study was not protocoled for  arterial imaging and narrowing at the celiac and SMA was not clearly demonstrated.  Heterogeneous opacities at the dependent aspect of both lower lobes.  Liver, gallbladder, spleen, pancreas are within normal limits.  The common bile duct is markedly dilated the pancreatic duct is normal in caliber, but this is a stable finding.  No obvious pancreatic head mass.  There is moderate free fluid in the pelvis.  Lack of oral contrast limits evaluation of bowel.  No loculated fluid collection to suggest abscess. Gas and fluid filled small bowel loops in the lower abdomen are mildly distended air-fluid levels.  Distal small bowel is decompressed. Within the adjacent mesentery, there is fluid density and stranding.  Bladder is unremarkable.  Uterus is absent.   Adrenal glands are within normal  limits.   Review of the MIP images confirms the above findings.  IMPRESSION: Aorto bifemoral bypass graft is patent without evidence of complication.  There is narrowing at the origin of the celiac and SMA.  These findings can be associated with chronic mesenteric ischemia.  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  Nonspecific free fluid in the pelvis.  There are distended small bowel loops in the lower abdomen and decompressed the distal small bowel loops. This is associated with edema within the mesentery.  Developing small bowel obstruction or an inflammatory process may be present.  Original Report Authenticated By: Donavan Burnet, M.D.    MEDICATIONS: Scheduled Meds:   . sodium chloride   Intravenous STAT  . ciprofloxacin  400 mg Intravenous Q12H  . enoxaparin  40 mg Subcutaneous Q24H  .  HYDROmorphone (DILAUDID) injection  1 mg Intravenous Once  . LORazepam  1 mg Intravenous Once  . metronidazole  500 mg Intravenous Q8H  .  morphine injection  4 mg Intravenous Once  . ondansetron  4 mg Intravenous Once  . pantoprazole (PROTONIX) IV  40 mg Intravenous Q24H  . potassium chloride  10 mEq  Intravenous Once  . sodium chloride  1,000 mL Intravenous Once   Continuous Infusions:   . dextrose 5 % and 0.9 % NaCl with KCl 20 mEq/L 100 mL/hr at 04/09/11 0932   PRN Meds:.acetaminophen, acetaminophen, iohexol, morphine, ondansetron (ZOFRAN) IV, ondansetron  Antibiotics: Anti-infectives     Start     Dose/Rate Route Frequency Ordered Stop   04/08/11 2200   ciprofloxacin (CIPRO) IVPB 400 mg        400 mg 200 mL/hr over 60 Minutes Intravenous Every 12 hours 04/08/11 2013     04/08/11 2200   metroNIDAZOLE (FLAGYL) IVPB 500 mg        500 mg 100 mL/hr over 60 Minutes Intravenous Every 8 hours 04/08/11 2013            Assessment/Plan: Patient Active Hospital Problem List: Abdominal pain -Has had recent femoral-popliteal bypass -Now presenting with abdominal pain,, CT of the abdomen showing mesenteric edema and a possible small bowel obstruction -NG tube is in place, however pain has been unchanged since yesterday, at this point I would continue to keep her n.p.o., I have already consulted [facility] and [facility] -Her last bowel movement was yesterday, since then she has had no bowel movement nor has not passed flatus as well. -Because of persistent leukocytosis and mesenteric edema, I would continue with ciprofloxacin and Flagyl.  PVD (peripheral vascular disease) -Recent aortofemoral bypass -Await [facility] follow-up  Mesenteric artery stenosis  -Not sure if this is causing her abdominal pain -However we'll await [facility] followup  Disposition: Remain inpatient  DVT Prophylaxis: Prophylactic heparin  Code Status: Full code  Maretta Bees, MD. 04/09/2011, 11:29 AM

## 2011-04-09 NOTE — Progress Notes (Signed)
INITIAL ADULT NUTRITION ASSESSMENT Date: 04/09/2011   Time: 10:17 AM Reason for Assessment: Nutrition Risk, weight loss  ASSESSMENT: Female 61 y.o.  Dx: ?small bowel obstruction  Hx:  Past Medical History  Diagnosis Date  . Uterine cancer   . Asthma   . Hyperlipidemia   . History of lead poisoning 1977  . Shortness of breath   . Peripheral vascular disease     claudication  right greater than left  femoral artery  . Anxiety   . Angina   . Blood transfusion   . Migraine   . Subclavian artery disease     left  . Small bowel obstruction, partial 04/08/11    Related Meds:     . sodium chloride   Intravenous STAT  . ciprofloxacin  400 mg Intravenous Q12H  . enoxaparin  40 mg Subcutaneous Q24H  .  HYDROmorphone (DILAUDID) injection  1 mg Intravenous Once  . LORazepam  1 mg Intravenous Once  . metronidazole  500 mg Intravenous Q8H  .  morphine injection  4 mg Intravenous Once  . ondansetron  4 mg Intravenous Once  . pantoprazole (PROTONIX) IV  40 mg Intravenous Q24H  . potassium chloride  10 mEq Intravenous Once  . sodium chloride  1,000 mL Intravenous Once     Ht: 5\' 4"  (162.6 cm)  Wt: 133 lb 8 oz (60.555 kg)  Ideal Wt: 54.5 kg % Ideal Wt: 111%  Usual Wt: pt unsure Wt Readings from Last 3 Encounters:  04/08/11 133 lb 8 oz (60.555 kg)  03/25/11 128 lb (58.06 kg)  03/14/11 130 lb (58.968 kg)  02/26/11  135 lbs (61.3 kg)  % Usual Wt: 99%  Body mass index is 22.92 kg/(m^2). WNL  Food/Nutrition Related Hx: Pt states she has early satiety since sx about 6 weeks ago (02/26/11).   Labs:  CMP     Component Value Date/Time   NA 136 04/09/2011 0553   K 3.9 04/09/2011 0553   CL 102 04/09/2011 0553   CO2 25 04/09/2011 0553   GLUCOSE 139* 04/09/2011 0553   BUN 11 04/09/2011 0553   CREATININE 0.57 04/09/2011 0553   CALCIUM 8.7 04/09/2011 0553   PROT 5.8* 04/09/2011 0553   ALBUMIN 3.1* 04/09/2011 0553   AST 14 04/09/2011 0553   ALT 17 04/09/2011 0553   ALKPHOS 68 04/09/2011 0553   BILITOT 0.4 04/09/2011 0553   GFRNONAA >90 04/09/2011 0553   GFRAA >90 04/09/2011 0553     Intake/Output Summary (Last 24 hours) at 04/09/11 1022 Last data filed at 04/09/11 0830  Gross per 24 hour  Intake      0 ml  Output    400 ml  Net   -400 ml     Diet Order: NPO  Supplements/Tube Feeding: none  IVF:    dextrose 5 % and 0.9 % NaCl with KCl 20 mEq/L Last Rate: 100 mL/hr at 04/09/11 0932    Estimated Nutritional Needs:   Kcal:  1500-1700 Protein:  60-70 gm Fluid: 1.5 - 1.7 L  Pt unable to give much hx, does not know usual weight. States that she has not been able to eat much since her sx in Feb. Now with SBO. Per weight hx does not appear to have lost any weight, but may be masked by retained bowel contents.   NUTRITION DIAGNOSIS: -Inadequate oral intake (NI-2.1).  Status: Ongoing  RELATED TO: early satiety and inability to eat  AS EVIDENCE BY: pt report, NPO diet  MONITORING/EVALUATION(Goals):  Goal: PO intake will meet >90% of estimated nutrition needs once diet advances Monitor: Diet advance, PO intake, labs  EDUCATION NEEDS: -No education needs identified at this time  INTERVENTION: 1. RD will add appropriate supplements once diet advances 2. RD will continue to follow  Dietitian [phone]  DOCUMENTATION CODES Per approved criteria  -Not Applicable    Clarene Duke MARIE 04/09/2011, 10:17 AM

## 2011-04-09 NOTE — Progress Notes (Signed)
   CARE MANAGEMENT NOTE 04/09/2011  Patient:  Kimberly [name], Kimberly [name]   Account Number:  [id number]  Date Initiated:  04/09/2011  Documentation initiated by:  Donn Pierini  Subjective/Objective Assessment:   Pt admitted with abd pain SBO, pt has NGT     Action/Plan:   PTA pt lived at home with family, was independent with ADL,-   Anticipated DC Date:  04/12/2011   Anticipated DC Plan:  HOME/SELF CARE      DC Planning Services  CM consult      Choice offered to / List presented to:             Status of service:  In process, will continue to follow Medicare Important Message given?   (If response is "NO", the following Medicare IM given date fields will be blank) Date Medicare IM given:   Date Additional Medicare IM given:    Discharge Disposition:    Per UR Regulation:    If discussed at Long Length of Stay Meetings, dates discussed:    Comments:  PCP- [place]  04/09/11- 1625- Donn Pierini RN, BSN [phone] Spoke with pt at bedside- per conversation pt states she lives with her children at home- recently got Medicaid- pt says she has a letter at home regarding her assigned PCP. Pt reports she will have transportation at discharge. CM to follow

## 2011-04-10 ENCOUNTER — Inpatient Hospital Stay (HOSPITAL_COMMUNITY): Payer: Medicaid Other

## 2011-04-10 ENCOUNTER — Inpatient Hospital Stay (HOSPITAL_COMMUNITY): Payer: Medicaid Other | Admitting: Anesthesiology

## 2011-04-10 ENCOUNTER — Encounter (HOSPITAL_COMMUNITY): Payer: Self-pay | Admitting: Anesthesiology

## 2011-04-10 ENCOUNTER — Encounter (HOSPITAL_COMMUNITY): Admission: EM | Disposition: A | Discharge status: Home Health | Payer: Self-pay | Source: Home / Self Care

## 2011-04-10 DIAGNOSIS — K565 Intestinal adhesions [bands], unspecified as to partial versus complete obstruction: Secondary | ICD-10-CM

## 2011-04-10 HISTORY — PX: LAPAROTOMY: SHX154

## 2011-04-10 LAB — DIFFERENTIAL
Basophils Absolute: 0 10*3/uL (ref 0.0–0.1)
Basophils Relative: 0 % (ref 0–1)
Eosinophils Absolute: 0.2 10*3/uL (ref 0.0–0.7)
Eosinophils Relative: 2 % (ref 0–5)
Lymphocytes Relative: 10 % — ABNORMAL LOW (ref 12–46)
Lymphs Abs: 0.9 10*3/uL (ref 0.7–4.0)
Monocytes Absolute: 0.5 10*3/uL (ref 0.1–1.0)
Monocytes Relative: 6 % (ref 3–12)
Neutro Abs: 7.4 10*3/uL (ref 1.7–7.7)
Neutrophils Relative %: 82 % — ABNORMAL HIGH (ref 43–77)

## 2011-04-10 LAB — CBC
HCT: 34 % — ABNORMAL LOW (ref 36.0–46.0)
HCT: 32.5 % — ABNORMAL LOW (ref 36.0–46.0)
Hemoglobin: 11.6 g/dL — ABNORMAL LOW (ref 12.0–15.0)
Hemoglobin: 11.1 g/dL — ABNORMAL LOW (ref 12.0–15.0)
MCH: 33.2 pg (ref 26.0–34.0)
MCH: 32.9 pg (ref 26.0–34.0)
MCHC: 34.1 g/dL (ref 30.0–36.0)
MCHC: 34.2 g/dL (ref 30.0–36.0)
MCV: 97.4 fL (ref 78.0–100.0)
MCV: 96.4 fL (ref 78.0–100.0)
Platelets: 208 10*3/uL (ref 150–400)
Platelets: 186 10*3/uL (ref 150–400)
RBC: 3.49 MIL/uL — ABNORMAL LOW (ref 3.87–5.11)
RBC: 3.37 MIL/uL — ABNORMAL LOW (ref 3.87–5.11)
RDW: 13.2 % (ref 11.5–15.5)
RDW: 12.9 % (ref 11.5–15.5)
WBC: 9 10*3/uL (ref 4.0–10.5)
WBC: 8.5 10*3/uL (ref 4.0–10.5)

## 2011-04-10 LAB — COMPREHENSIVE METABOLIC PANEL
ALT: 13 U/L (ref 0–35)
AST: 12 U/L (ref 0–37)
Albumin: 2.8 g/dL — ABNORMAL LOW (ref 3.5–5.2)
Alkaline Phosphatase: 70 U/L (ref 39–117)
BUN: 5 mg/dL — ABNORMAL LOW (ref 6–23)
CO2: 25 mEq/L (ref 19–32)
Calcium: 8.7 mg/dL (ref 8.4–10.5)
Chloride: 104 mEq/L (ref 96–112)
Creatinine, Ser: 0.58 mg/dL (ref 0.50–1.10)
GFR calc Af Amer: >90 mL/min (ref >90)
GFR calc non Af Amer: >90 mL/min (ref >90)
Glucose, Bld: 142 mg/dL — ABNORMAL HIGH (ref 70–99)
Potassium: 3.8 mEq/L (ref 3.5–5.1)
Sodium: 137 mEq/L (ref 135–145)
Total Bilirubin: 0.4 mg/dL (ref 0.3–1.2)
Total Protein: 5.4 g/dL — ABNORMAL LOW (ref 6.0–8.3)

## 2011-04-10 LAB — DRUGS OF ABUSE SCREEN W/O ALC, ROUTINE URINE
Amphetamine Screen, Ur: NEGATIVE
Barbiturate Quant, Ur: NEGATIVE
Benzodiazepines.: NEGATIVE
Cocaine Metabolites: NEGATIVE
Creatinine,U: 16.1 mg/dL
Marijuana Metabolite: NEGATIVE
Methadone: NEGATIVE
Opiate Screen, Urine: NEGATIVE
Phencyclidine (PCP): NEGATIVE
Propoxyphene: NEGATIVE

## 2011-04-10 LAB — SURGICAL PCR SCREEN
MRSA, PCR: NEGATIVE
Staphylococcus aureus: NEGATIVE

## 2011-04-10 LAB — BASIC METABOLIC PANEL
BUN: 4 mg/dL — ABNORMAL LOW (ref 6–23)
CO2: 26 mEq/L (ref 19–32)
Calcium: 7.8 mg/dL — ABNORMAL LOW (ref 8.4–10.5)
Chloride: 103 mEq/L (ref 96–112)
Creatinine, Ser: 0.48 mg/dL — ABNORMAL LOW (ref 0.50–1.10)
GFR calc Af Amer: >90 mL/min (ref >90)
GFR calc non Af Amer: >90 mL/min (ref >90)
Glucose, Bld: 115 mg/dL — ABNORMAL HIGH (ref 70–99)
Potassium: 3.7 mEq/L (ref 3.5–5.1)
Sodium: 134 mEq/L — ABNORMAL LOW (ref 135–145)

## 2011-04-10 LAB — TYPE AND SCREEN
ABO/RH(D): O POS
Antibody Screen: NEGATIVE

## 2011-04-10 SURGERY — LAPAROTOMY, EXPLORATORY
Anesthesia: General | Site: Abdomen | Wound class: Clean

## 2011-04-10 MED ORDER — FENTANYL CITRATE 0.05 MG/ML IJ SOLN
INTRAMUSCULAR | Status: AC
  Filled 2011-04-10: qty 2

## 2011-04-10 MED ORDER — CIPROFLOXACIN IN D5W 400 MG/200ML IV SOLN
400.0000 mg | Freq: Two times a day (BID) | INTRAVENOUS | Status: AC
  Administered 2011-04-10: 400 mg via INTRAVENOUS
  Filled 2011-04-10 (×2): qty 200

## 2011-04-10 MED ORDER — PROPOFOL 10 MG/ML IV EMUL
INTRAVENOUS | Status: DC | PRN
  Administered 2011-04-10: 150 mg via INTRAVENOUS
  Administered 2011-04-10: 40 mg via INTRAVENOUS

## 2011-04-10 MED ORDER — FENTANYL CITRATE 0.05 MG/ML IJ SOLN
50.0000 ug | INTRAMUSCULAR | Status: DC | PRN
  Administered 2011-04-10: 50 ug via INTRAVENOUS

## 2011-04-10 MED ORDER — DIPHENHYDRAMINE HCL 50 MG/ML IJ SOLN: 12.5000 mg | Freq: Four times a day (QID) | INTRAMUSCULAR | Status: DC | PRN

## 2011-04-10 MED ORDER — ONDANSETRON HCL 4 MG/2ML IJ SOLN: 4.0000 mg | Freq: Four times a day (QID) | INTRAMUSCULAR | Status: DC | PRN

## 2011-04-10 MED ORDER — DIPHENHYDRAMINE HCL 12.5 MG/5ML PO ELIX
12.5000 mg | ORAL_SOLUTION | Freq: Four times a day (QID) | ORAL | Status: DC | PRN
  Filled 2011-04-10: qty 5

## 2011-04-10 MED ORDER — BUPIVACAINE LIPOSOME 1.3 % IJ SUSP
20.0000 mL | Freq: Once | INTRAMUSCULAR | Status: DC
  Filled 2011-04-10: qty 20

## 2011-04-10 MED ORDER — METRONIDAZOLE IN NACL 5-0.79 MG/ML-% IV SOLN
500.0000 mg | Freq: Three times a day (TID) | INTRAVENOUS | Status: AC
  Administered 2011-04-10: 500 mg via INTRAVENOUS
  Filled 2011-04-10 (×2): qty 100

## 2011-04-10 MED ORDER — MORPHINE SULFATE (PF) 1 MG/ML IV SOLN
INTRAVENOUS | Status: AC
  Filled 2011-04-10: qty 25

## 2011-04-10 MED ORDER — SODIUM CHLORIDE 0.9 % IV BOLUS (SEPSIS)
1000.0000 mL | Freq: Once | INTRAVENOUS | Status: AC
  Administered 2011-04-10: 1000 mL via INTRAVENOUS

## 2011-04-10 MED ORDER — SODIUM CHLORIDE 0.9 % IJ SOLN
INTRAMUSCULAR | Status: DC | PRN
  Administered 2011-04-10: 10 mL via INTRAVENOUS

## 2011-04-10 MED ORDER — SUCCINYLCHOLINE CHLORIDE 20 MG/ML IJ SOLN
INTRAMUSCULAR | Status: DC | PRN
  Administered 2011-04-10: 100 mg via INTRAVENOUS

## 2011-04-10 MED ORDER — ONDANSETRON HCL 4 MG/2ML IJ SOLN
INTRAMUSCULAR | Status: DC | PRN
  Administered 2011-04-10: 4 mg via INTRAVENOUS

## 2011-04-10 MED ORDER — LACTATED RINGERS IV SOLN
INTRAVENOUS | Status: DC | PRN
  Administered 2011-04-10 (×2): via INTRAVENOUS

## 2011-04-10 MED ORDER — BUPIVACAINE LIPOSOME 1.3 % IJ SUSP
INTRAMUSCULAR | Status: DC | PRN
  Administered 2011-04-10: 20 mL

## 2011-04-10 MED ORDER — NALOXONE HCL 0.4 MG/ML IJ SOLN: 0.4000 mg | INTRAMUSCULAR | Status: DC | PRN

## 2011-04-10 MED ORDER — SODIUM CHLORIDE 0.9 % IV BOLUS (SEPSIS): 500.0000 mL | Freq: Once | INTRAVENOUS | Status: AC | PRN

## 2011-04-10 MED ORDER — HYDROMORPHONE 0.3 MG/ML IV SOLN
INTRAVENOUS | Status: AC
  Filled 2011-04-10: qty 25

## 2011-04-10 MED ORDER — ACETAMINOPHEN 10 MG/ML IV SOLN
INTRAVENOUS | Status: DC | PRN
  Administered 2011-04-10: 1000 mg via INTRAVENOUS

## 2011-04-10 MED ORDER — LIDOCAINE HCL (CARDIAC) 20 MG/ML IV SOLN
INTRAVENOUS | Status: DC | PRN
  Administered 2011-04-10: 80 mg via INTRAVENOUS

## 2011-04-10 MED ORDER — KCL IN DEXTROSE-NACL 20-5-0.9 MEQ/L-%-% IV SOLN
INTRAVENOUS | Status: DC
  Administered 2011-04-10 – 2011-04-11 (×2): via INTRAVENOUS
  Filled 2011-04-10 (×4): qty 1000

## 2011-04-10 MED ORDER — SODIUM CHLORIDE 0.9 % IJ SOLN: 9.0000 mL | INTRAMUSCULAR | Status: DC | PRN

## 2011-04-10 MED ORDER — HEPARIN SODIUM (PORCINE) 5000 UNIT/ML IJ SOLN
5000.0000 [IU] | Freq: Three times a day (TID) | INTRAMUSCULAR | Status: DC
  Administered 2011-04-11 – 2011-04-20 (×25): 5000 [IU] via SUBCUTANEOUS
  Filled 2011-04-10 (×34): qty 1

## 2011-04-10 MED ORDER — ROCURONIUM BROMIDE 100 MG/10ML IV SOLN
INTRAVENOUS | Status: DC | PRN
  Administered 2011-04-10: 40 mg via INTRAVENOUS

## 2011-04-10 MED ORDER — HYDROMORPHONE 0.3 MG/ML IV SOLN
INTRAVENOUS | Status: AC
  Administered 2011-04-10: 22:00:00 via INTRAVENOUS
  Administered 2011-04-11: 2.4 mg via INTRAVENOUS
  Administered 2011-04-11: 3 mg via INTRAVENOUS
  Administered 2011-04-11: 1.2 mg via INTRAVENOUS
  Administered 2011-04-11: 2.4 mg via INTRAVENOUS
  Administered 2011-04-11: 17:00:00 via INTRAVENOUS
  Administered 2011-04-11: 3 mg via INTRAVENOUS
  Administered 2011-04-11: 2.4 mg via INTRAVENOUS
  Administered 2011-04-12: 2.7 mg via INTRAVENOUS
  Administered 2011-04-12: 0.3 mg via INTRAVENOUS
  Administered 2011-04-12: 2.65 mg via INTRAVENOUS
  Administered 2011-04-12: 2.7 mg via INTRAVENOUS
  Administered 2011-04-12: 07:00:00 via INTRAVENOUS
  Administered 2011-04-12: 1.5 mg via INTRAVENOUS
  Administered 2011-04-13: 2.4 mg via INTRAVENOUS
  Administered 2011-04-13: 06:00:00 via INTRAVENOUS
  Administered 2011-04-13: 1.5 mg via INTRAVENOUS
  Administered 2011-04-13: 18:00:00 via INTRAVENOUS
  Administered 2011-04-14: 0.9 mg via INTRAVENOUS
  Administered 2011-04-14: 1.2 mg via INTRAVENOUS
  Administered 2011-04-14: 12:00:00 via INTRAVENOUS
  Administered 2011-04-15: 0.3 mg via INTRAVENOUS
  Administered 2011-04-15: 4.08 mg via INTRAVENOUS
  Administered 2011-04-15: 1.08 mg via INTRAVENOUS
  Administered 2011-04-16: 1.8 mg via INTRAVENOUS
  Administered 2011-04-16: 0.9 mg via INTRAVENOUS
  Administered 2011-04-16: 2.7 mg via INTRAVENOUS
  Administered 2011-04-16: 1.2 mg via INTRAVENOUS
  Administered 2011-04-16: 0.9 mg via INTRAVENOUS
  Administered 2011-04-16: 2.4 mg via INTRAVENOUS
  Administered 2011-04-17: 1.2 mg via INTRAVENOUS
  Administered 2011-04-17: 2.4 mg via INTRAVENOUS
  Administered 2011-04-17: 4.5 mg via INTRAVENOUS
  Administered 2011-04-17: 2.4 mg via INTRAVENOUS

## 2011-04-10 MED ORDER — FENTANYL CITRATE 0.05 MG/ML IJ SOLN
INTRAMUSCULAR | Status: DC | PRN
  Administered 2011-04-10: 50 ug via INTRAVENOUS
  Administered 2011-04-10: 100 ug via INTRAVENOUS
  Administered 2011-04-10 (×3): 50 ug via INTRAVENOUS
  Administered 2011-04-10: 100 ug via INTRAVENOUS

## 2011-04-10 MED ORDER — LABETALOL HCL 5 MG/ML IV SOLN
INTRAVENOUS | Status: DC | PRN
  Administered 2011-04-10: 5 mg via INTRAVENOUS

## 2011-04-10 MED ORDER — 0.9 % SODIUM CHLORIDE (POUR BTL) OPTIME
TOPICAL | Status: DC | PRN
  Administered 2011-04-10: 3000 mL

## 2011-04-10 MED ORDER — LACTATED RINGERS IV SOLN
INTRAVENOUS | Status: DC
  Administered 2011-04-10: 12:00:00 via INTRAVENOUS

## 2011-04-10 MED ORDER — ONDANSETRON HCL 4 MG PO TABS: 4.0000 mg | ORAL_TABLET | Freq: Four times a day (QID) | ORAL | Status: DC | PRN

## 2011-04-10 MED ORDER — MIDAZOLAM HCL 5 MG/5ML IJ SOLN
INTRAMUSCULAR | Status: DC | PRN
  Administered 2011-04-10: 1 mg via INTRAVENOUS

## 2011-04-10 MED ORDER — HYDROMORPHONE HCL PF 1 MG/ML IJ SOLN
0.2500 mg | INTRAMUSCULAR | Status: DC | PRN
  Administered 2011-04-10 (×2): 0.5 mg via INTRAVENOUS

## 2011-04-10 MED ORDER — NEOSTIGMINE METHYLSULFATE 1 MG/ML IJ SOLN
INTRAMUSCULAR | Status: DC | PRN
  Administered 2011-04-10: 4 mg via INTRAVENOUS

## 2011-04-10 MED ORDER — GLYCOPYRROLATE 0.2 MG/ML IJ SOLN
INTRAMUSCULAR | Status: DC | PRN
  Administered 2011-04-10: 0.6 mg via INTRAVENOUS

## 2011-04-10 MED ORDER — MORPHINE SULFATE (PF) 1 MG/ML IV SOLN
INTRAVENOUS | Status: DC
  Administered 2011-04-10: 16.5 mg via INTRAVENOUS
  Administered 2011-04-10 (×2): via INTRAVENOUS

## 2011-04-10 MED ORDER — ACETAMINOPHEN 10 MG/ML IV SOLN
INTRAVENOUS | Status: AC
  Filled 2011-04-10: qty 100

## 2011-04-10 MED ORDER — ENOXAPARIN SODIUM 40 MG/0.4ML ~~LOC~~ SOLN: 40.0000 mg | SUBCUTANEOUS | Status: DC

## 2011-04-10 SURGICAL SUPPLY — 55 items
BLADE SURG ROTATE 9660 (MISCELLANEOUS) ×2 IMPLANT
BRR ADH 5X3 SEPRAFILM 6 SHT (MISCELLANEOUS) ×2
CANISTER SUCTION 2500CC (MISCELLANEOUS) ×4 IMPLANT
CHLORAPREP W/TINT 26ML (MISCELLANEOUS) ×3 IMPLANT
CLOTH BEACON ORANGE TIMEOUT ST (SAFETY) ×3 IMPLANT
COVER SURGICAL LIGHT HANDLE (MISCELLANEOUS) ×3 IMPLANT
DRAPE LAPAROSCOPIC ABDOMINAL (DRAPES) ×3 IMPLANT
DRAPE UTILITY 15X26 W/TAPE STR (DRAPE) ×6 IMPLANT
DRAPE WARM FLUID 44X44 (DRAPE) ×3 IMPLANT
ELECT BLADE 6.5 EXT (BLADE) IMPLANT
ELECT REM PT RETURN 9FT ADLT (ELECTROSURGICAL) ×3
ELECTRODE REM PT RTRN 9FT ADLT (ELECTROSURGICAL) ×2 IMPLANT
GLOVE BIO SURGEON STRL SZ7.5 (GLOVE) ×4 IMPLANT
GLOVE BIO SURGEON STRL SZ8 (GLOVE) ×2 IMPLANT
GLOVE BIOGEL PI IND STRL 7.0 (GLOVE) ×1 IMPLANT
GLOVE BIOGEL PI IND STRL 7.5 (GLOVE) ×2 IMPLANT
GLOVE BIOGEL PI INDICATOR 7.0 (GLOVE) ×1
GLOVE BIOGEL PI INDICATOR 7.5 (GLOVE) ×2
GLOVE ECLIPSE 6.5 STRL STRAW (GLOVE) ×2 IMPLANT
GLOVE ECLIPSE 7.5 STRL STRAW (GLOVE) ×2 IMPLANT
GLOVE SURG SIGNA 7.5 PF LTX (GLOVE) ×6 IMPLANT
GOWN STRL NON-REIN LRG LVL3 (GOWN DISPOSABLE) ×8 IMPLANT
GOWN STRL REIN XL XLG (GOWN DISPOSABLE) ×7 IMPLANT
KIT BASIN OR (CUSTOM PROCEDURE TRAY) ×3 IMPLANT
KIT ROOM TURNOVER OR (KITS) ×3 IMPLANT
NS IRRIG 1000ML POUR BTL (IV SOLUTION) ×7 IMPLANT
PACK GENERAL/GYN (CUSTOM PROCEDURE TRAY) ×3 IMPLANT
PAD ARMBOARD 7.5X6 YLW CONV (MISCELLANEOUS) ×4 IMPLANT
SEPRAFILM PROCEDURAL PACK 3X5 (MISCELLANEOUS) ×2 IMPLANT
SPONGE GAUZE 4X4 12PLY (GAUZE/BANDAGES/DRESSINGS) ×6 IMPLANT
SPONGE LAP 18X18 X RAY DECT (DISPOSABLE) IMPLANT
STAPLER VISISTAT 35W (STAPLE) ×3 IMPLANT
SUCTION POOLE TIP (SUCTIONS) ×2 IMPLANT
SUT NOVA 1 T20/GS 25DT (SUTURE) ×4 IMPLANT
SUT NOVA NAB GS-21 0 18 T12 DT (SUTURE) IMPLANT
SUT PDS AB 1 CTX 36 (SUTURE) IMPLANT
SUT PDS AB 1 TP1 54 (SUTURE) ×4 IMPLANT
SUT PDS AB 4-0 SH 27 (SUTURE) ×2 IMPLANT
SUT SILK 2 0 (SUTURE) ×3
SUT SILK 2 0 REEL (SUTURE) ×2 IMPLANT
SUT SILK 2 0 SH CR/8 (SUTURE) ×3 IMPLANT
SUT SILK 2-0 18XBRD TIE 12 (SUTURE) ×1 IMPLANT
SUT SILK 3 0 (SUTURE) ×3
SUT SILK 3 0 SH CR/8 (SUTURE) ×6 IMPLANT
SUT SILK 3-0 18XBRD TIE 12 (SUTURE) ×2 IMPLANT
SUT VIC AB 3-0 54X BRD REEL (SUTURE) ×2 IMPLANT
SUT VIC AB 3-0 BRD 54 (SUTURE)
SUT VICRYL 2 0 18  UND BR (SUTURE) ×1
SUT VICRYL 2 0 18 UND BR (SUTURE) ×1 IMPLANT
TOWEL OR 17X24 6PK STRL BLUE (TOWEL DISPOSABLE) ×3 IMPLANT
TOWEL OR 17X26 10 PK STRL BLUE (TOWEL DISPOSABLE) ×3 IMPLANT
TRAY FOLEY CATH 14FRSI W/METER (CATHETERS) ×2 IMPLANT
TUBE ANAEROBIC SPECIMEN COL (MISCELLANEOUS) IMPLANT
UNDERPAD 30X30 INCONTINENT (UNDERPADS AND DIAPERS) IMPLANT
WATER STERILE IRR 1000ML POUR (IV SOLUTION) ×1 IMPLANT

## 2011-04-10 NOTE — Transfer of Care (Signed)
Immediate Anesthesia Transfer of Care Note  Patient: Kimberly [name]  Procedure(s) Performed: Procedure(s) (LRB): EXPLORATORY LAPAROTOMY (N/A)  Patient Location: PACU  Anesthesia Type: General  Level of Consciousness: oriented, sedated and patient cooperative  Airway & Oxygen Therapy: Patient Spontanous Breathing and Patient connected to face mask oxygen  Post-op Assessment: Report given to PACU RN, Post -op Vital signs reviewed and stable and Patient moving all extremities  Post vital signs: Reviewed and stable  Complications: No apparent anesthesia complications

## 2011-04-10 NOTE — Progress Notes (Signed)
Patient ID: Kimberly [name], female   DOB: 04-27-1950, 61 y.o.   MRN: [id number]  PATIENT DETAILS Name: Kimberly [name] Age: 61 y.o. Sex: female Date of Birth: [birth year] Admit Date: 04/08/2011 JYN:[email or web address],HYQMV L, MD, MD  Subjective:  with abdominal pain. Sad and anxious about surgery  Objective: Vital signs in last 24 hours: Filed Vitals:   04/09/11 2240 04/10/11 0532 04/10/11 0910 04/10/11 1144  BP: 143/76 132/70 122/72 155/81  Pulse: 83 83 75 80  Temp: 98.3 F (36.8 C) 97.7 F (36.5 C) 97.5 F (36.4 C) 98.2 F (36.8 C)  TempSrc: Oral Oral Oral Oral  Resp: 18 17 16 18   Height:      Weight:      SpO2: 98% 96% 95% 94%    Weight change:   Body mass index is 22.92 kg/(m^2).  Intake/Output from previous day:  Intake/Output Summary (Last 24 hours) at 04/10/11 1506 Last data filed at 04/10/11 1500  Gross per 24 hour  Intake   4615 ml  Output   1125 ml  Net   3490 ml    PHYSICAL EXAM: Gen Exam: Awake and alert with clear speech.  Chest: B/L Clear.   CVS: S1 S2 Regular, no murmurs.  Abdomen: slightly distended, I did not hear BS, soft but diffusely tender all over-with voluntary gaurding Extremities: no edema, lower extremities warm to touch. Neurologic: Non Focal.   Skin: No Rash.   Wounds: N/A.    CONSULTS:  general surgery and vascular surgery  LAB RESULTS: CBC  Lab 04/10/11 0620 04/09/11 0553 04/08/11 2015 04/08/11 1343  WBC 9.0 11.7* 13.3* 8.3  HGB 11.6* 12.3 12.2 12.7  HCT 34.0* 35.7* 35.2* 37.7  PLT 208 217 193 236  MCV 97.4 96.2 94.6 95.9  MCH 33.2 33.2 32.8 32.3  MCHC 34.1 34.5 34.7 33.7  RDW 13.2 13.0 12.9 12.9  LYMPHSABS 0.9 -- -- 1.7  MONOABS 0.5 -- -- 0.4  EOSABS 0.2 -- -- 0.2  BASOSABS 0.0 -- -- 0.0  BANDABS -- -- -- --    Chemistries   Lab 04/10/11 0620 04/09/11 0553 04/08/11 2015 04/08/11 1343  NA 137 136 -- 136  K 3.8 3.9 -- 3.4*  CL 104 102 -- 98  CO2 25 25 -- 26  GLUCOSE 142* 139* -- 122*  BUN 5* 11 -- 18  CREATININE 0.58 0.57  0.60 0.63  CALCIUM 8.7 8.7 -- 9.7  MG -- 1.7 1.7 --     Coagulation profile  Lab 04/09/11 0553  INR 0.99  PROTIME --    Cardiac Enzymes  Lab 04/08/11 2015 04/08/11 1345  CKMB 1.3 --  TROPONINI <0.30 <0.30  MYOGLOBIN -- --    Basename 04/08/11 2015  TSH 3.625  T4TOTAL --  T3FREE --  THYROIDAB --    Basename 04/08/11 1343  LIPASE 45  AMYLASE --    MICROBIOLOGY: Recent Results (from the past 240 hour(s))  SURGICAL PCR SCREEN     Status: Normal   Collection Time   04/10/11 11:47 AM      Component Value Range Status Comment   MRSA, PCR NEGATIVE  NEGATIVE  Final    Staphylococcus aureus NEGATIVE  NEGATIVE  Final     RADIOLOGY STUDIES/RESULTS: Dg Chest 2 View  03/19/2011  *RADIOLOGY REPORT*  Clinical Data:  Left-sided chest pain.  CHEST - 2 VIEW  Comparison: 02/27/2011  Findings: The heart size and mediastinal contours are within normal limits.  Both lungs are clear.  The visualized skeletal structures  are unremarkable.  IMPRESSION: No active disease.  Original Report Authenticated By: Reola Calkins, M.D.   Abd 1 View (kub)  04/09/2011  *RADIOLOGY REPORT*  Clinical Data: Abdominal stent surgery 6 weeks ago, abdominal tenderness, pain particular left upper quadrant and upper abdomen into back, abnormal CT  ABDOMEN - 1 VIEW  Comparison: CT abdomen and pelvis 04/08/2011  Findings: Persistent dilatation of small bowel loops in the left lower quadrant pelvis. No definite bowel wall thickening. Nasogastric tube coiled in stomach. Remaining bowel gas pattern unremarkable. Excreted contrast within urinary bladder. No acute osseous findings.  IMPRESSION: Persistent dilatation of small bowel loops in the left lower quadrant and pelvis, small bowel obstruction not excluded. No interval change.  Original Report Authenticated By: Lollie Marrow, M.D.   Ct Angio Chest W/cm &/or Wo Cm  03/19/2011  *RADIOLOGY REPORT*  Clinical Data:  Chest pain.  Abdominal pain.  CT ANGIOGRAPHY CHEST WITH  CONTRAST  Technique:  Multidetector CT imaging of the chest was performed using the standard protocol during bolus administration of intravenous contrast.  Multiplanar CT image reconstructions including MIPs were obtained to evaluate the vascular anatomy.  Contrast: OMNIPAQUE IOHEXOL 350 MG/ML IV SOLN  Comparison:  07/28/2010  Findings:  There are no filling defects in the pulmonary arterial tree to suggest acute pulmonary thromboembolism.  Negative abnormal mediastinal adenopathy.  No pericardial effusion.  No pneumothorax and no pleural effusion  Clear lungs.  No acute bony deformity.  Review of the MIP images confirms the above findings.  IMPRESSION: No evidence of acute pulmonary thromboembolism.  CT ABDOMEN AND PELVIS WITH CONTRAST  Technique:  Multidetector CT imaging of the abdomen and pelvis was performed using the standard protocol following bolus administration of intravenous contrast.  Contrast: OMNIPAQUE IOHEXOL 350 MG/ML IV SOLN  Comparison:  02/05/2011  Findings:  Aorto bifemoral bypass graft repair has been performed. Both limbs of the graft opacify and are patent.  The aortic portion is patent.  The proximal anastomosis is patent.  Narrowing at the origin of the celiac and SMA is redemonstrated.  Liver is unremarkable.  Gallbladder is decompressed.  Spleen, adrenal glands, pancreas are stable in appearance.  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  Postoperative changes in the abdominal wall and bilateral inguinal regions are noted.  No evidence of abscess.  No free intraperitoneal gas.  Bladder is within normal limits.  Uterus is absent.  IMPRESSION: Postoperative changes from aorto bifemoral bypass graft are noted. No obvious complication.  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  Original Report Authenticated By: Donavan Burnet, M.D.   Ct Abdomen Pelvis W Contrast  03/19/2011  *RADIOLOGY REPORT*  Clinical Data:  Chest pain.  Abdominal pain.  CT  ANGIOGRAPHY CHEST WITH CONTRAST  Technique:  Multidetector CT imaging of the chest was performed using the standard protocol during bolus administration of intravenous contrast.  Multiplanar CT image reconstructions including MIPs were obtained to evaluate the vascular anatomy.  Contrast: OMNIPAQUE IOHEXOL 350 MG/ML IV SOLN  Comparison:  07/28/2010  Findings:  There are no filling defects in the pulmonary arterial tree to suggest acute pulmonary thromboembolism.  Negative abnormal mediastinal adenopathy.  No pericardial effusion.  No pneumothorax and no pleural effusion  Clear lungs.  No acute bony deformity.  Review of the MIP images confirms the above findings.  IMPRESSION: No evidence of acute pulmonary thromboembolism.  CT ABDOMEN AND PELVIS WITH CONTRAST  Technique:  Multidetector CT imaging of the  abdomen and pelvis was performed using the standard protocol following bolus administration of intravenous contrast.  Contrast: OMNIPAQUE IOHEXOL 350 MG/ML IV SOLN  Comparison:  02/05/2011  Findings:  Aorto bifemoral bypass graft repair has been performed. Both limbs of the graft opacify and are patent.  The aortic portion is patent.  The proximal anastomosis is patent.  Narrowing at the origin of the celiac and SMA is redemonstrated.  Liver is unremarkable.  Gallbladder is decompressed.  Spleen, adrenal glands, pancreas are stable in appearance.  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  Postoperative changes in the abdominal wall and bilateral inguinal regions are noted.  No evidence of abscess.  No free intraperitoneal gas.  Bladder is within normal limits.  Uterus is absent.  IMPRESSION: Postoperative changes from aorto bifemoral bypass graft are noted. No obvious complication.  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  Original Report Authenticated By: Donavan Burnet, M.D.   Portable Chest 1 View  04/08/2011  *RADIOLOGY REPORT*  Clinical Data: Chest pain.  PORTABLE  CHEST - 1 VIEW  Comparison: CT chest 03/19/2011.  Findings: The NG tube is coursing down the esophagus and into the stomach.  The cardiac silhouette, mediastinal and hilar contours are within normal limits and stable.  The lungs are clear.  No infiltrate or effusion.  Minimal streaky basilar atelectasis.  IMPRESSION:  1.  The NG tube enters the stomach. 2.  Streaky bibasilar atelectasis but no infiltrates or effusions.  Original Report Authenticated By: P. Loralie Champagne, M.D.   Ct Angio Abd/pel W/ And/or W/o  04/08/2011  *RADIOLOGY REPORT*  Clinical Data:  Epigastric pain  CT ANGIOGRAPHY ABDOMEN AND PELVIS  Technique:  Multidetector CT imaging of the abdomen and pelvis was performed using the standard protocol during bolus administration of intravenous contrast.  Multiplanar reconstructed images including MIPs were obtained and reviewed to evaluate the vascular anatomy.  Contrast:   100 ml Omnipaque 350  Comparison:  03/19/2011  Findings:  Aorto bifemoral bypass graft is patent.  Common iliac arteries are occluded.  Internal and external iliac arteries reconstitute.  There is mild narrowing of the right common femoral artery beyond the distal anastomosis.   There is at least 60% narrowing just beyond the origin of the SMA. There is approximately 50% narrowing at the origin of the celiac axis.  IMA branches reconstitute has its origin has been occluded by the bypass graft.  There is no evidence of contrast extravasation to suggest leak or pseudoaneurysm formation.  The left renal artery is widely patent.  There is significant narrowing at the origin of the right renal artery which is critical.  The prior study was not protocoled for arterial imaging and narrowing at the celiac and SMA was not clearly demonstrated.  Heterogeneous opacities at the dependent aspect of both lower lobes.  Liver, gallbladder, spleen, pancreas are within normal limits.  The common bile duct is markedly dilated the pancreatic duct is  normal in caliber, but this is a stable finding.  No obvious pancreatic head mass.  There is moderate free fluid in the pelvis.  Lack of oral contrast limits evaluation of bowel.  No loculated fluid collection to suggest abscess. Gas and fluid filled small bowel loops in the lower abdomen are mildly distended air-fluid levels.  Distal small bowel is decompressed. Within the adjacent mesentery, there is fluid density and stranding.  Bladder is unremarkable.  Uterus is absent.   Adrenal glands are within normal limits.  Review of the MIP images confirms the above findings.  IMPRESSION: Aorto bifemoral bypass graft is patent without evidence of complication.  There is narrowing at the origin of the celiac and SMA.  These findings can be associated with chronic mesenteric ischemia.  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  Nonspecific free fluid in the pelvis.  There are distended small bowel loops in the lower abdomen and decompressed the distal small bowel loops. This is associated with edema within the mesentery.  Developing small bowel obstruction or an inflammatory process may be present.  Original Report Authenticated By: Donavan Burnet, M.D.    MEDICATIONS: Scheduled Meds:    . bupivacaine liposome  20 mL Infiltration Once  . ciprofloxacin  400 mg Intravenous Q12H  . enoxaparin  40 mg Subcutaneous Once  . enoxaparin (LOVENOX) injection  40 mg Subcutaneous Q24H  . metronidazole  500 mg Intravenous Q8H  . pantoprazole (PROTONIX) IV  40 mg Intravenous Q24H  . DISCONTD: enoxaparin (LOVENOX) injection  40 mg Subcutaneous Q24H   Continuous Infusions:    . dextrose 5 % and 0.9 % NaCl with KCl 20 mEq/L 150 mL/hr at 04/10/11 0356  . lactated ringers 20 mL/hr at 04/10/11 1220   PRN Meds:.0.9 % irrigation (POUR BTL), acetaminophen, acetaminophen, bupivacaine liposome, fentaNYL, morphine, ondansetron (ZOFRAN) IV, ondansetron, sodium chloride  Antibiotics: Anti-infectives      Start     Dose/Rate Route Frequency Ordered Stop   04/08/11 2200   ciprofloxacin (CIPRO) IVPB 400 mg        400 mg 200 mL/hr over 60 Minutes Intravenous Every 12 hours 04/08/11 2013     04/08/11 2200   metroNIDAZOLE (FLAGYL) IVPB 500 mg        500 mg 100 mL/hr over 60 Minutes Intravenous Every 8 hours 04/08/11 2013            Assessment/Plan: Patient Active Hospital Problem List: Abdominal pain -Has had recent femoral-popliteal bypass -Now presenting with abdominal pain, CT of the abdomen showing mesenteric edema and a possible small bowel obstruction -NG tube is in place, however pain has been unchanged since yesterday -she has had no bowel movement nor has not passed flatus as well. -on ciprofloxacin and Flagyl.  Going to the O.R. April 3.  Will likely go to intensive care or step down after surgery.  PVD (peripheral vascular disease) -Recent aortofemoral bypass -Await VVS follow-up  Mesenteric artery stenosis   Disposition: Remain inpatient  DVT Prophylaxis: Prophylactic heparin  Code Status: Full code  Algis Downs, PA-C [facility] Pager: [phone]  04/10/2011, 3:06 PM

## 2011-04-10 NOTE — Preoperative (Signed)
Beta Blockers   Reason not to administer Beta Blockers:Not Applicable 

## 2011-04-10 NOTE — Brief Op Note (Signed)
04/08/2011 - 04/10/2011  2:57 PM  PATIENT:  Sharion Dove, 61 y.o., female, MRN: [id number]  PREOP DIAGNOSIS:  ischemic bowel  POSTOP DIAGNOSIS:   Closed loop small bowel obstruction secondary to adhesions  PROCEDURE:   Procedure(s): EXPLORATORY LAPAROTOMY, enterolysis of adhesions  SURGEON:   Ovidio Kin, M.D.  Threasa HeadsBarrie Dunker, MD  ANESTHESIA:   general  Marni Griffon, CRNA - CRNA Raiford Simmonds, MD - Anesthesiologist Elon Alas, CRNA - CRNA Marni Griffon, CRNA - CRNA  General  EBL:  150  ml  BLOOD ADMINISTERED: none  DRAINS: none   LOCAL MEDICATIONS USED:   20 cc Exparel  SPECIMEN:   none  COUNTS CORRECT:  YES  INDICATIONS FOR PROCEDURE:  MAKENZYE TROUTMAN is a 61 y.o. (DOB: 03/31/1950) white female whose primary care physician is VOLLMER,KELLY L, MD, MD and comes to the OR for exploration for ischemic bowel vs small bowel obstruction.   The indications and risks of the surgery were explained to the patient.  The risks include, but are not limited to, infection, bleeding, and nerve injury.  Note dictated to:   #[id number]

## 2011-04-10 NOTE — Anesthesia Procedure Notes (Addendum)
Date/Time: 04/10/2011 2:12 PM Performed by: Darcey Nora B   Procedure Name: Intubation Date/Time: 04/10/2011 1:43 PM Performed by: Marni Griffon Pre-anesthesia Checklist: Patient identified, Emergency Drugs available, Suction available and Patient being monitored Patient Re-evaluated:Patient Re-evaluated prior to inductionOxygen Delivery Method: Circle system utilized Preoxygenation: Pre-oxygenation with 100% oxygen Intubation Type: IV induction and Rapid sequence Grade View: Grade II Tube type: Oral Tube size: 7.5 mm Number of attempts: 1 Airway Equipment and Method: Stylet Placement Confirmation: ETT inserted through vocal cords under direct vision,  positive ETCO2 and breath sounds checked- equal and bilateral Secured at: 21 (cm at teeth) cm Tube secured with: Tape Dental Injury: Teeth and Oropharynx as per pre-operative assessment

## 2011-04-10 NOTE — Anesthesia Postprocedure Evaluation (Signed)
Anesthesia Post Note  Patient: Kimberly [name]  Procedure(s) Performed: Procedure(s) (LRB): EXPLORATORY LAPAROTOMY (N/A)  Anesthesia type: General  Patient location: PACU  Post pain: Pain level controlled and Adequate analgesia  Post assessment: Post-op Vital signs reviewed, Patient's Cardiovascular Status Stable, Respiratory Function Stable, Patent Airway and Pain level controlled  Last Vitals:  Filed Vitals:   04/10/11 1527  BP:   Pulse:   Temp: 36.3 C  Resp:     Post vital signs: Reviewed and stable  Level of consciousness: awake, alert  and oriented  Complications: No apparent anesthesia complications

## 2011-04-10 NOTE — Anesthesia Preprocedure Evaluation (Addendum)
Anesthesia Evaluation  Patient identified by MRN, date of birth, ID band Patient awake    Reviewed: Allergy & Precautions, H&P , NPO status , Patient's Chart, lab work & pertinent test results  Airway Mallampati: II  Neck ROM: full    Dental  (+) Loose   Pulmonary shortness of breath, asthma , former smoker         Cardiovascular + Peripheral Vascular Disease     Neuro/Psych  Headaches, PSYCHIATRIC DISORDERS Anxiety    GI/Hepatic   Endo/Other    Renal/GU      Musculoskeletal   Abdominal   Peds  Hematology   Anesthesia Other Findings Pt reports that all of her teeth are loose.    Reproductive/Obstetrics                          Anesthesia Physical Anesthesia Plan  ASA: III  Anesthesia Plan: General   Post-op Pain Management:    Induction: Intravenous  Airway Management Planned: Oral ETT  Additional Equipment:   Intra-op Plan:   Post-operative Plan: Extubation in OR  Informed Consent: I have reviewed the patients History and Physical, chart, labs and discussed the procedure including the risks, benefits and alternatives for the proposed anesthesia with the patient or authorized representative who has indicated his/her understanding and acceptance.     Plan Discussed with: CRNA and Surgeon  Anesthesia Plan Comments:         Anesthesia Quick Evaluation

## 2011-04-10 NOTE — Progress Notes (Signed)
Addendum  Patient seen and examined, chart and data base reviewed.  I agree with the above assessment and plan  For full details please see Mrs. Algis Downs PA. Note.  Seen after her surgery, AAO X3.  She has Hypotension with SBP 80-100. It was OK this AM, Likely secondary to anasthesia.  Clint Lipps Pager: [phone] 04/10/2011, 5:15 PM

## 2011-04-10 NOTE — Progress Notes (Signed)
Patient having pain control issues on full dose morphine PCA.  3mg  bolus given via PCA with little effect.  Dr. Magnus Ivan contacted and orders received to switch PCA to full dose dilaudid.  Pt has NKDA and is neuro intact VS:  Hr 83 SR,  BP 113/66, sO2 98% on 2L nasal cannula.  Steward score of 6.  Will switch  PCA to full dose dilaudid and continue to monitor.

## 2011-04-10 NOTE — Progress Notes (Signed)
Patient switched to full dose PCA dilaudid.  20ml morphine wasted down sink.  Witnessed by Cato Mulligan RN

## 2011-04-10 NOTE — Progress Notes (Signed)
[facility]  Daily Progress Note  Assessment/Planning: Possible SBO, s/p Aobifem BPG   In reviewing, Gen Surg notes, pt scheduled for X-lap today  In reviewing the CTA, celiac and SMA perfusion appears intact so I don't think the pt has vasculogenic mesenteric ischemia but obviously the CTA is a single snapshot  Will continue to follow  Subjective   Continued abd pain  Objective Filed Vitals:   04/09/11 1336 04/09/11 2240 04/10/11 0532 04/10/11 0910  BP: 183/74 143/76 132/70 122/72  Pulse: 83 83 83 75  Temp: 97.3 F (36.3 C) 98.3 F (36.8 C) 97.7 F (36.5 C) 97.5 F (36.4 C)  TempSrc:  Oral Oral Oral  Resp: 18 18 17 16   Height:      Weight:      SpO2: 97% 98% 96% 95%    Intake/Output Summary (Last 24 hours) at 04/10/11 1106 Last data filed at 04/10/11 0749  Gross per 24 hour  Intake 4301.67 ml  Output    950 ml  Net 3351.67 ml    PULM  CTAB CV  RRR GI  soft, mildly distended, diffusely TTP, +mild guarding  Laboratory CBC    Component Value Date/Time   WBC 9.0 04/10/2011 0620   HGB 11.6* 04/10/2011 0620   HCT 34.0* 04/10/2011 0620   PLT 208 04/10/2011 0620    BMET    Component Value Date/Time   NA 137 04/10/2011 0620   K 3.8 04/10/2011 0620   CL 104 04/10/2011 0620   CO2 25 04/10/2011 0620   GLUCOSE 142* 04/10/2011 0620   BUN 5* 04/10/2011 0620   CREATININE 0.58 04/10/2011 0620   CALCIUM 8.7 04/10/2011 0620   GFRNONAA >90 04/10/2011 0620   GFRAA >90 04/10/2011 1610    Leonides Sake, MD [facility] Office: [phone] Pager: [phone]  04/10/2011, 11:06 AM

## 2011-04-10 NOTE — Progress Notes (Signed)
Patient ID: Kimberly [name], female   DOB: 1950-12-09, 61 y.o.   MRN: [id number]    Subjective: Pt feels about the same.  Looks a little better but still with quite a bit of pain.  No flatus.  Objective: Vital signs in last 24 hours: Temp:  [97.3 F (36.3 C)-98.3 F (36.8 C)] 97.7 F (36.5 C) (04/03 0532) Pulse Rate:  [83] 83  (04/03 0532) Resp:  [17-18] 17  (04/03 0532) BP: (132-183)/(70-76) 132/70 mmHg (04/03 0532) SpO2:  [96 %-98 %] 96 % (04/03 0532) Last BM Date: 04/08/11  Intake/Output from previous day: 04/02 0701 - 04/03 0700 In: 5493.3 [P.O.:400; I.V.:4093.3; IV Piggyback:1000] Out: 950 [Urine:450; Emesis/NG output:500] Intake/Output this shift: Total I/O In: -  Out: 250 [Urine:250]  PE: Abd: soft, little increase in bloating, +BS, still diffusely tender. Heart: regular Lungs: CTAB  Lab Results:   Basename 04/10/11 0620 04/09/11 0553  WBC 9.0 11.7*  HGB 11.6* 12.3  HCT 34.0* 35.7*  PLT 208 217   BMET  Basename 04/09/11 0553 04/08/11 2015 04/08/11 1343  NA 136 -- 136  K 3.9 -- 3.4*  CL 102 -- 98  CO2 25 -- 26  GLUCOSE 139* -- 122*  BUN 11 -- 18  CREATININE 0.57 0.60 --  CALCIUM 8.7 -- 9.7   PT/INR  Basename 04/09/11 0553  LABPROT 13.3  INR 0.99   CMP     Component Value Date/Time   NA 136 04/09/2011 0553   K 3.9 04/09/2011 0553   CL 102 04/09/2011 0553   CO2 25 04/09/2011 0553   GLUCOSE 139* 04/09/2011 0553   BUN 11 04/09/2011 0553   CREATININE 0.57 04/09/2011 0553   CALCIUM 8.7 04/09/2011 0553   PROT 5.8* 04/09/2011 0553   ALBUMIN 3.1* 04/09/2011 0553   AST 14 04/09/2011 0553   ALT 17 04/09/2011 0553   ALKPHOS 68 04/09/2011 0553   BILITOT 0.4 04/09/2011 0553   GFRNONAA >90 04/09/2011 0553   GFRAA >90 04/09/2011 0553   Lipase     Component Value Date/Time   LIPASE 45 04/08/2011 1343       Studies/Results: Abd 1 View (kub)  04/09/2011  *RADIOLOGY REPORT*  Clinical Data: Abdominal stent surgery 6 weeks ago, abdominal tenderness, pain particular left upper  quadrant and upper abdomen into back, abnormal CT  ABDOMEN - 1 VIEW  Comparison: CT abdomen and pelvis 04/08/2011  Findings: Persistent dilatation of small bowel loops in the left lower quadrant pelvis. No definite bowel wall thickening. Nasogastric tube coiled in stomach. Remaining bowel gas pattern unremarkable. Excreted contrast within urinary bladder. No acute osseous findings.  IMPRESSION: Persistent dilatation of small bowel loops in the left lower quadrant and pelvis, small bowel obstruction not excluded. No interval change.  Original Report Authenticated By: Lollie Marrow, M.D.   Portable Chest 1 View  04/08/2011  *RADIOLOGY REPORT*  Clinical Data: Chest pain.  PORTABLE CHEST - 1 VIEW  Comparison: CT chest 03/19/2011.  Findings: The NG tube is coursing down the esophagus and into the stomach.  The cardiac silhouette, mediastinal and hilar contours are within normal limits and stable.  The lungs are clear.  No infiltrate or effusion.  Minimal streaky basilar atelectasis.  IMPRESSION:  1.  The NG tube enters the stomach. 2.  Streaky bibasilar atelectasis but no infiltrates or effusions.  Original Report Authenticated By: P. Loralie Champagne, M.D.   Ct Angio Abd/pel W/ And/or W/o  04/08/2011  *RADIOLOGY REPORT*  Clinical Data:  Epigastric pain  CT ANGIOGRAPHY ABDOMEN AND PELVIS  Technique:  Multidetector CT imaging of the abdomen and pelvis was performed using the standard protocol during bolus administration of intravenous contrast.  Multiplanar reconstructed images including MIPs were obtained and reviewed to evaluate the vascular anatomy.  Contrast:   100 ml Omnipaque 350  Comparison:  03/19/2011  Findings:  Aorto bifemoral bypass graft is patent.  Common iliac arteries are occluded.  Internal and external iliac arteries reconstitute.  There is mild narrowing of the right common femoral artery beyond the distal anastomosis.   There is at least 60% narrowing just beyond the origin of the SMA. There is  approximately 50% narrowing at the origin of the celiac axis.  IMA branches reconstitute has its origin has been occluded by the bypass graft.  There is no evidence of contrast extravasation to suggest leak or pseudoaneurysm formation.  The left renal artery is widely patent.  There is significant narrowing at the origin of the right renal artery which is critical.  The prior study was not protocoled for arterial imaging and narrowing at the celiac and SMA was not clearly demonstrated.  Heterogeneous opacities at the dependent aspect of both lower lobes.  Liver, gallbladder, spleen, pancreas are within normal limits.  The common bile duct is markedly dilated the pancreatic duct is normal in caliber, but this is a stable finding.  No obvious pancreatic head mass.  There is moderate free fluid in the pelvis.  Lack of oral contrast limits evaluation of bowel.  No loculated fluid collection to suggest abscess. Gas and fluid filled small bowel loops in the lower abdomen are mildly distended air-fluid levels.  Distal small bowel is decompressed. Within the adjacent mesentery, there is fluid density and stranding.  Bladder is unremarkable.  Uterus is absent.   Adrenal glands are within normal limits.   Review of the MIP images confirms the above findings.  IMPRESSION: Aorto bifemoral bypass graft is patent without evidence of complication.  There is narrowing at the origin of the celiac and SMA.  These findings can be associated with chronic mesenteric ischemia.  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  Nonspecific free fluid in the pelvis.  There are distended small bowel loops in the lower abdomen and decompressed the distal small bowel loops. This is associated with edema within the mesentery.  Developing small bowel obstruction or an inflammatory process may be present.  Original Report Authenticated By: Donavan Burnet, M.D.    Anti-infectives: Anti-infectives     Start     Dose/Rate Route  Frequency Ordered Stop   04/08/11 2200   ciprofloxacin (CIPRO) IVPB 400 mg        400 mg 200 mL/hr over 60 Minutes Intravenous Every 12 hours 04/08/11 2013     04/08/11 2200   metroNIDAZOLE (FLAGYL) IVPB 500 mg        500 mg 100 mL/hr over 60 Minutes Intravenous Every 8 hours 04/08/11 2013             Assessment/Plan  1. Possible ischemic bowel  Plan: 1. Will plan on OR today for ex lap. 2. Hold lovenox 3. Cont cipro and flagyl. 4. We had a long discussion with the patient regarding this operation.  She is now agreeable.  Dr. Ezzard Standing discussed in detail the procedure along with risks and complications.  She is willing to proceed.   LOS: 2 days   The patient is not any sicker than yesterday, but her abdominal pain  is not any better.  Her WBC is better and her VS are stable.  But I remain concerned about possible ischemic bowel.  I have reviewed the CT angio with Dr. Jerilee Field.  There is no major vessel to reconstruct.  And she appears to have a segment of bowel is that is edematous with a partial obstruction (or localized ileus).  A lesser possible diagnosis is a SBO secondary to adhesion. I think she is best served undergoing an exploration and evaluation of the small bowel.  She understands that she may need a bowel resection.  I reviewed with her the risks of surgery, including bleeding, possible ostomy, and that she could have further trouble after surgery. She will go to the OR today.  OSBORNE,KELLY E 04/10/2011  Ovidio Kin, MD, [facility] Surgery Pager: [phone] Office phone:  [phone]

## 2011-04-11 ENCOUNTER — Encounter (HOSPITAL_COMMUNITY): Payer: Self-pay | Admitting: Surgery

## 2011-04-11 LAB — COMPREHENSIVE METABOLIC PANEL
ALT: 11 U/L (ref 0–35)
AST: 15 U/L (ref 0–37)
Albumin: 2.2 g/dL — ABNORMAL LOW (ref 3.5–5.2)
Alkaline Phosphatase: 63 U/L (ref 39–117)
BUN: 7 mg/dL (ref 6–23)
CO2: 25 mEq/L (ref 19–32)
Calcium: 8.1 mg/dL — ABNORMAL LOW (ref 8.4–10.5)
Chloride: 102 mEq/L (ref 96–112)
Creatinine, Ser: 0.9 mg/dL (ref 0.50–1.10)
GFR calc Af Amer: 80 mL/min — ABNORMAL LOW (ref >90)
GFR calc non Af Amer: 69 mL/min — ABNORMAL LOW (ref >90)
Glucose, Bld: 139 mg/dL — ABNORMAL HIGH (ref 70–99)
Potassium: 4.7 mEq/L (ref 3.5–5.1)
Sodium: 133 mEq/L — ABNORMAL LOW (ref 135–145)
Total Bilirubin: 0.7 mg/dL (ref 0.3–1.2)
Total Protein: 4.7 g/dL — ABNORMAL LOW (ref 6.0–8.3)

## 2011-04-11 LAB — CBC
HCT: 38.4 % (ref 36.0–46.0)
Hemoglobin: 13 g/dL (ref 12.0–15.0)
MCH: 33.1 pg (ref 26.0–34.0)
MCHC: 33.9 g/dL (ref 30.0–36.0)
MCV: 97.7 fL (ref 78.0–100.0)
Platelets: 217 10*3/uL (ref 150–400)
RBC: 3.93 MIL/uL (ref 3.87–5.11)
RDW: 13.1 % (ref 11.5–15.5)
WBC: 8.8 10*3/uL (ref 4.0–10.5)

## 2011-04-11 MED ORDER — SODIUM CHLORIDE 0.9 % IV BOLUS (SEPSIS)
500.0000 mL | Freq: Once | INTRAVENOUS | Status: AC
  Administered 2011-04-11: 500 mL via INTRAVENOUS

## 2011-04-11 MED ORDER — HYDROMORPHONE 0.3 MG/ML IV SOLN
INTRAVENOUS | Status: AC
  Filled 2011-04-11: qty 25

## 2011-04-11 MED ORDER — DEXTROSE-NACL 5-0.9 % IV SOLN
INTRAVENOUS | Status: DC
  Administered 2011-04-11 (×2): via INTRAVENOUS
  Administered 2011-04-12: 150 mL/h via INTRAVENOUS

## 2011-04-11 MED FILL — Hydromorphone HCl Inj 1 MG/ML: INTRAMUSCULAR | Qty: 1 | Status: AC

## 2011-04-11 NOTE — Progress Notes (Signed)
[facility]  Daily Progress Note  Assessment/Planning: POD #1 s/p LOA, Xlap, s/p Aobifem BPG   Intraop findings noted  Mgmt per Gen Surg  Will check on pt periodically  Subjective  - 1 Day Post-Op  Cont abd pain but different from before  Objective Filed Vitals:   04/11/11 1000 04/11/11 1100 04/11/11 1200 04/11/11 1203  BP: 119/56 106/49 107/49   Pulse: 107 98 97   Temp:    99 F (37.2 C)  TempSrc:    Oral  Resp: 20 7 8    Height:      Weight:      SpO2: 97% 98% 97%     Intake/Output Summary (Last 24 hours) at 04/11/11 1232 Last data filed at 04/11/11 1220  Gross per 24 hour  Intake 5359.5 ml  Output   1155 ml  Net 4204.5 ml    PULM  B rales CV  RRR GI  soft, appropriately TTP, hypo BS, bandaged incision VASC  Palpable B femoral, well perfused feet  Laboratory CBC    Component Value Date/Time   WBC 8.8 04/11/2011 0400   HGB 13.0 04/11/2011 0400   HCT 38.4 04/11/2011 0400   PLT 217 04/11/2011 0400    BMET    Component Value Date/Time   NA 133* 04/11/2011 0400   K 4.7 04/11/2011 0400   CL 102 04/11/2011 0400   CO2 25 04/11/2011 0400   GLUCOSE 139* 04/11/2011 0400   BUN 7 04/11/2011 0400   CREATININE 0.90 04/11/2011 0400   CALCIUM 8.1* 04/11/2011 0400   GFRNONAA 69* 04/11/2011 0400   GFRAA 80* 04/11/2011 0400    Leonides Sake, MD [facility] [place] Office: [phone] Pager: [phone]  04/11/2011, 12:32 PM

## 2011-04-11 NOTE — Progress Notes (Signed)
[facility]  Subjective: Patient is sitting up in bed. Asking about the jacket that "they lost in the ER". Not highly concerned about talking to me about her abdomen. Wanting to drink water.   Objective: Blood pressure 127/65, pulse 99, temperature 99 F (37.2 C), temperature source Oral, resp. rate 8, height 5\' 4"  (1.626 m), weight 66.3 kg (146 lb 2.6 oz), SpO2 97.00%. Weight change:   Intake/Output Summary (Last 24 hours) at 04/11/11 1700 Last data filed at 04/11/11 1600  Gross per 24 hour  Intake 3969.5 ml  Output    935 ml  Net 3034.5 ml    Physical Exam: General appearance: alert and no distress Lungs: clear to auscultation bilaterally Heart: regular rate and rhythm, S1, S2 normal, no murmur, click, rub or gallop Abdomen: soft, no bowel sounds, tender, dressing in mid abdomen not opened.  Extremities: extremities normal, atraumatic, no cyanosis or edema  Lab Results:  Basename 04/11/11 0400 04/10/11 1741 04/09/11 0553 04/08/11 2015  NA 133* 134* -- --  K 4.7 3.7 -- --  CL 102 103 -- --  CO2 25 26 -- --  GLUCOSE 139* 115* -- --  BUN 7 4* -- --  CREATININE 0.90 0.48* -- --  CALCIUM 8.1* 7.8* -- --  MG -- -- 1.7 1.7  PHOS -- -- -- 4.2    Basename 04/11/11 0400 04/10/11 0620  AST 15 12  ALT 11 13  ALKPHOS 63 70  BILITOT 0.7 0.4  PROT 4.7* 5.4*  ALBUMIN 2.2* 2.8*   No results found for this basename: LIPASE:2,AMYLASE:2 in the last 72 hours  Basename 04/11/11 0400 04/10/11 1741 04/10/11 0620  WBC 8.8 8.5 --  NEUTROABS -- -- 7.4  HGB 13.0 11.1* --  HCT 38.4 32.5* --  MCV 97.7 96.4 --  PLT 217 186 --    Basename 04/08/11 2015  CKTOTAL 18  CKMB 1.3  CKMBINDEX --  TROPONINI <0.30   No components found with this basename: POCBNP:3 No results found for this basename: DDIMER:2 in the last 72 hours No results found for this basename: HGBA1C:2 in the last 72 hours No results found for this basename: CHOL:2,HDL:2,LDLCALC:2,TRIG:2,CHOLHDL:2,LDLDIRECT:2  in the last 72 hours  Basename 04/08/11 2015  TSH 3.625  T4TOTAL --  T3FREE --  THYROIDAB --   No results found for this basename: VITAMINB12:2,FOLATE:2,FERRITIN:2,TIBC:2,IRON:2,RETICCTPCT:2 in the last 72 hours  Micro Results: Recent Results (from the past 240 hour(s))  SURGICAL PCR SCREEN     Status: Normal   Collection Time   04/10/11 11:47 AM      Component Value Range Status Comment   MRSA, PCR NEGATIVE  NEGATIVE  Final    Staphylococcus aureus NEGATIVE  NEGATIVE  Final     Studies/Results: Abd 1 View (kub)  04/09/2011  *RADIOLOGY REPORT*  Clinical Data: Abdominal stent surgery 6 weeks ago, abdominal tenderness, pain particular left upper quadrant and upper abdomen into back, abnormal CT  ABDOMEN - 1 VIEW  Comparison: CT abdomen and pelvis 04/08/2011  Findings: Persistent dilatation of small bowel loops in the left lower quadrant pelvis. No definite bowel wall thickening. Nasogastric tube coiled in stomach. Remaining bowel gas pattern unremarkable. Excreted contrast within urinary bladder. No acute osseous findings.  IMPRESSION: Persistent dilatation of small bowel loops in the left lower quadrant and pelvis, small bowel obstruction not excluded. No interval change.  Original Report Authenticated By: Lollie Marrow, M.D.   Dg Chest Port 1 View  04/10/2011  *RADIOLOGY REPORT*  Clinical Data: Post  bowel resection, right jugular line placement  PORTABLE CHEST - 1 VIEW  Comparison: Portable exam 1550 hours compared to 04/08/2011  Findings: Nasogastric tube coiled in stomach. New right jugular central venous catheter, tip projecting over SVC. Normal heart size, mediastinal contours and pulmonary vascularity for technique. Bibasilar atelectasis. Minimal bronchitic changes. Remaining lungs clear. No definite pleural effusion or pneumothorax. Minimal atherosclerotic calcification aorta. No acute osseous findings.  IMPRESSION: No pneumothorax following central line insertion. Bibasilar atelectasis  and minimal bronchitic changes.  Original Report Authenticated By: Lollie Marrow, M.D.   Portable Chest 1 View  04/08/2011  *RADIOLOGY REPORT*  Clinical Data: Chest pain.  PORTABLE CHEST - 1 VIEW  Comparison: CT chest 03/19/2011.  Findings: The NG tube is coursing down the esophagus and into the stomach.  The cardiac silhouette, mediastinal and hilar contours are within normal limits and stable.  The lungs are clear.  No infiltrate or effusion.  Minimal streaky basilar atelectasis.  IMPRESSION:  1.  The NG tube enters the stomach. 2.  Streaky bibasilar atelectasis but no infiltrates or effusions.  Original Report Authenticated By: P. Loralie Champagne, M.D.   Ct Angio Abd/pel W/ And/or W/o  04/08/2011  *RADIOLOGY REPORT*  Clinical Data:  Epigastric pain  CT ANGIOGRAPHY ABDOMEN AND PELVIS  Technique:  Multidetector CT imaging of the abdomen and pelvis was performed using the standard protocol during bolus administration of intravenous contrast.  Multiplanar reconstructed images including MIPs were obtained and reviewed to evaluate the vascular anatomy.  Contrast:   100 ml Omnipaque 350  Comparison:  03/19/2011  Findings:  Aorto bifemoral bypass graft is patent.  Common iliac arteries are occluded.  Internal and external iliac arteries reconstitute.  There is mild narrowing of the right common femoral artery beyond the distal anastomosis.   There is at least 60% narrowing just beyond the origin of the SMA. There is approximately 50% narrowing at the origin of the celiac axis.  IMA branches reconstitute has its origin has been occluded by the bypass graft.  There is no evidence of contrast extravasation to suggest leak or pseudoaneurysm formation.  The left renal artery is widely patent.  There is significant narrowing at the origin of the right renal artery which is critical.  The prior study was not protocoled for arterial imaging and narrowing at the celiac and SMA was not clearly demonstrated.  Heterogeneous  opacities at the dependent aspect of both lower lobes.  Liver, gallbladder, spleen, pancreas are within normal limits.  The common bile duct is markedly dilated the pancreatic duct is normal in caliber, but this is a stable finding.  No obvious pancreatic head mass.  There is moderate free fluid in the pelvis.  Lack of oral contrast limits evaluation of bowel.  No loculated fluid collection to suggest abscess. Gas and fluid filled small bowel loops in the lower abdomen are mildly distended air-fluid levels.  Distal small bowel is decompressed. Within the adjacent mesentery, there is fluid density and stranding.  Bladder is unremarkable.  Uterus is absent.   Adrenal glands are within normal limits.   Review of the MIP images confirms the above findings.  IMPRESSION: Aorto bifemoral bypass graft is patent without evidence of complication.  There is narrowing at the origin of the celiac and SMA.  These findings can be associated with chronic mesenteric ischemia.  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  Nonspecific free fluid in the pelvis.  There are distended small bowel loops in the  lower abdomen and decompressed the distal small bowel loops. This is associated with edema within the mesentery.  Developing small bowel obstruction or an inflammatory process may be present.  Original Report Authenticated By: Donavan Burnet, M.D.    Medications: Scheduled Meds:   . bupivacaine liposome  20 mL Infiltration Once  . ciprofloxacin  400 mg Intravenous Q12H  . heparin  5,000 Units Subcutaneous Q8H  . HYDROmorphone PCA 0.3 mg/mL   Intravenous Q4H  . metronidazole  500 mg Intravenous Q8H  . morphine      . pantoprazole (PROTONIX) IV  40 mg Intravenous Q24H  . sodium chloride  1,000 mL Intravenous Once  . sodium chloride  500 mL Intravenous Once  . sodium chloride  500 mL Intravenous Once  . DISCONTD: ciprofloxacin  400 mg Intravenous Q12H  . DISCONTD: enoxaparin  40 mg Subcutaneous Once    . DISCONTD: enoxaparin (LOVENOX) injection  40 mg Subcutaneous Q24H  . DISCONTD: metronidazole  500 mg Intravenous Q8H  . DISCONTD: morphine   Intravenous Q4H   Continuous Infusions:   . dextrose 5 % and 0.9% NaCl 150 mL/hr at 04/11/11 0508  . DISCONTD: dextrose 5 % and 0.9 % NaCl with KCl 20 mEq/L 150 mL/hr at 04/10/11 0356  . DISCONTD: dextrose 5 % and 0.9 % NaCl with KCl 20 mEq/L 150 mL/hr at 04/11/11 0301  . DISCONTD: lactated ringers 20 mL/hr at 04/10/11 1220   PRN Meds:.acetaminophen, acetaminophen, diphenhydrAMINE, diphenhydrAMINE, naloxone, ondansetron (ZOFRAN) IV, ondansetron, sodium chloride, sodium chloride, DISCONTD: diphenhydrAMINE, DISCONTD: diphenhydrAMINE, DISCONTD: morphine, DISCONTD: naloxone, DISCONTD: ondansetron (ZOFRAN) IV, DISCONTD: ondansetron (ZOFRAN) IV, DISCONTD: ondansetron (ZOFRAN) IV, DISCONTD: ondansetron, DISCONTD: sodium chloride  Assessment/Plan:  Small bowel obstruction s/p lysis of adhesions on 4/3  Management per sugery  PVD s/p Aortobifemoral bypass graft Vas surg following  Mesenteric artery stenosis    LOS: 3 days   [facility] [phone] 04/11/2011, 5:00 PM

## 2011-04-11 NOTE — Op Note (Signed)
NAMECHRISLYN, SEEDORF                 ACCOUNT NO.:  [id number]  MEDICAL RECORD NO.:  [id number]  LOCATION:  2305                         FACILITY:  [facility]  PHYSICIAN:  Sandria Bales. Ezzard Standing, M.D.  DATE OF BIRTH:  [birth year]  DATE OF PROCEDURE:  04/10/2011                              OPERATIVE REPORT  PREOPERATIVE DIAGNOSIS:  Ischemic bowel versus small bowel obstruction.  POSTOPERATIVE DIAGNOSIS:  Small bowel obstruction secondary to closed loop obstruction from adhesions.  PROCEDURES:  Exploratory laparotomy with enterolysis of adhesions.  SURGEON:  Sandria Bales. Ezzard Standing, MD  FIRST ASSISTANT:  Abigail Miyamoto, MD.  ANESTHESIA:  General.  Anesthesia supervised by Achille Rich, local anesthetic was 20 mL of Exparel.  COMPLICATIONS:  None.  INDICATIONS FOR PROCEDURE:  Mr. Joycie Peek is a 61 year old white female who sees Dr. Yisroel Ramming who is her primary medical doctor.  She underwent an aortobifem bypass by Dr. Durene Cal on February 26, 2011, and was doing well until 2 days ago when she presented to the [facility] Room with acute abdominal pain.  Her CT scan suggested some edematous small bowel, and the mid small bowel with either a partial bowel blockage or ileus.    She was given about 36 hours of NG tube, bowel rest, fluid resuscitation, IV antibiotics without significant improvement in her symptoms, and discussion was carried out bringing the patient to the operating room for abdominal exploration.  I think that she most likely has ischemic bowel with a second possibility of a small bowel obstruction due to adhesions.  I discussed with her the indications, potential risks of surgery, potential risks of surgery include, but are not limited to, bleeding, bowel resection, ostomy, and recurrent symptoms.  OPERATIVE NOTE:  The patient was placed in a supine position in room 17 with general anesthesia supervised by Dr. Achille Rich.  Her abdomen was prepped with ChloraPrep and  sterilely draped.    A time-out was held and surgical checklist run.  I went to the lower half of her midline incision and went a little bit below it to get into her abdominal cavity.  Upon entering the abdominal cavity, she had omentum stuck up to the anterior abdominal wall.  She had about 500-600 mL of serousy ascites in the abdominal cavity.  She had a distended bowel in her mid small bowel, but no evidence of ischemia. She has a closed loop obstruction with small bowel and mesentery adhered to the aortic graft closure in the retroperitoneum.  I used both sharp dissection and finger fracture to free up this retroperitoneal adhesions and small bowel.  Distally, her bowel was decompressed, so we started at the terminal ileum and worked backwards.  This was probably about mid small bowel where the obstruction was.  She also had adhesions under her transverse mesocolon and balled up under the splenic flexure of the mesocolon.    The remaining adhesions in the LUQ I did not enterolysis, because I thought trying to get this out would not benefit her from obstruction standpoint.   I irrigated the abdomen with 2 L of saline.  I reinspected the bowel, there was no evidence of any significant serosal tear  nor was there any ischemic bowel.  I could see an indention of the band across the small bowel where I think she had a closed loop obstruction secondary to these adhesions.  She had adhesions of her transverse colon and omentum to the anterior abdominal wall, so I was not able to feel to her stomach or evaluate her liver.  Her pelvis was otherwise unremarkable.  I then placed Seprafilm under the fascia prior to closure.  I then closed her abdomen with 2 running #1 PDS sutures with interrupted #1 Novafil sutures.  I closed the skin with staples.    She tolerated the procedure well.  Her sponge and needle count were correct at the end of the case.  I think because of repeat surgery to 6  weeks out, she had significant vascular disease, I am going to put her in the ICU for tonight.  She had a central line placed at the end of the case and was then transferred to the ICU in good condition.   Sandria Bales. Ezzard Standing, M.D., FACS   DHN/MEDQ  D:  04/10/2011  T:  04/10/2011  Job:  [id number]  cc:   Tresa Endo L. Philipp Deputy, M.D. Jorge Ny, MD

## 2011-04-11 NOTE — Progress Notes (Signed)
Utilization Review Completed.Takia Runyon T4/04/2011   

## 2011-04-11 NOTE — Progress Notes (Signed)
Patient's urine output decreased to 15cc/hr x2h.  HR low 100's, BP 90's/50  Dr. Magnus Ivan notified and order rec'd for 500cc NS bolus over one hour.  Pt's potassium 4.7 with am labs.  Order rec'd to switch MIVF from D5NS w/ K+ at 150/hr to D5NS at 150cc/hr.  Will continue to monitor.

## 2011-04-11 NOTE — Progress Notes (Signed)
General Surgery Note  LOS: 3 days  POD# 1 Room - 2305 PCP - Collene Schlichter  Assessment/Plan:  1.  Enterolysis of adhesions for SBO - D. Emmanuelle Hibbitts - 04/10/2011  Needed some fluid boluses over night.  Doing well otherwise.  Will keep in ICU for one more day.   2.  Significant PVD (peripheral vascular disease) (04/08/2011)  S/p aortobifem - WMyra Gianotti - 2/19 2013.  Still needs work on her carotids. 3.  Mesenteric artery stenosis 4.  History of smoking - says she has quit. 5.  DVT proph - on SQ Heparin. 6.  Foley.  Will leave to monitor UO.  Subjective:  Wants something to drink. Pain fairly well controlled. Objective:   Filed Vitals:   04/11/11 0800  BP:   Pulse:   Temp: 98.8 F (37.1 C)  Resp:      Intake/Output from previous day:  04/03 0701 - 04/04 0700 In: 4683.5 [I.V.:3753.5; NG/GT:120; IV Piggyback:810] Out: 1625 [Urine:1150; Emesis/NG output:375; Blood:100]  Intake/Output this shift:      Physical Exam:   General: WN WF who is alert and oriented.    HEENT: Normal. Pupils equal. Has NGT. Marland Kitchen   Lungs: Clear, but IS only 750.   Abdomen: quiet.   Wound: covered.   Neurologic:  Grossly intact to motor and sensory function.   Psychiatric: Has normal mood and affect. Behavior is normal   Lab Results:    Basename 04/11/11 0400 04/10/11 1741  WBC 8.8 8.5  HGB 13.0 11.1*  HCT 38.4 32.5*  PLT 217 186    BMET   Basename 04/11/11 0400 04/10/11 1741  NA 133* 134*  K 4.7 3.7  CL 102 103  CO2 25 26  GLUCOSE 139* 115*  BUN 7 4*  CREATININE 0.90 0.48*  CALCIUM 8.1* 7.8*    PT/INR   Basename 04/09/11 0553  LABPROT 13.3  INR 0.99    ABG  No results found for this basename: PHART:2,PCO2:2,PO2:2,HCO3:2 in the last 72 hours   Studies/Results:  Abd 1 View (kub)  04/09/2011  *RADIOLOGY REPORT*  Clinical Data: Abdominal stent surgery 6 weeks ago, abdominal tenderness, pain particular left upper quadrant and upper abdomen into back, abnormal CT  ABDOMEN - 1 VIEW   Comparison: CT abdomen and pelvis 04/08/2011  Findings: Persistent dilatation of small bowel loops in the left lower quadrant pelvis. No definite bowel wall thickening. Nasogastric tube coiled in stomach. Remaining bowel gas pattern unremarkable. Excreted contrast within urinary bladder. No acute osseous findings.  IMPRESSION: Persistent dilatation of small bowel loops in the left lower quadrant and pelvis, small bowel obstruction not excluded. No interval change.  Original Report Authenticated By: Lollie Marrow, M.D.   Dg Chest Port 1 View  04/10/2011  *RADIOLOGY REPORT*  Clinical Data: Post bowel resection, right jugular line placement  PORTABLE CHEST - 1 VIEW  Comparison: Portable exam 1550 hours compared to 04/08/2011  Findings: Nasogastric tube coiled in stomach. New right jugular central venous catheter, tip projecting over SVC. Normal heart size, mediastinal contours and pulmonary vascularity for technique. Bibasilar atelectasis. Minimal bronchitic changes. Remaining lungs clear. No definite pleural effusion or pneumothorax. Minimal atherosclerotic calcification aorta. No acute osseous findings.  IMPRESSION: No pneumothorax following central line insertion. Bibasilar atelectasis and minimal bronchitic changes.  Original Report Authenticated By: Lollie Marrow, M.D.     Anti-infectives:   Anti-infectives     Start     Dose/Rate Route Frequency Ordered Stop   04/10/11 2200   ciprofloxacin (  CIPRO) IVPB 400 mg        400 mg 200 mL/hr over 60 Minutes Intravenous Every 12 hours 04/10/11 1722 04/10/11 2353   04/10/11 2200   metroNIDAZOLE (FLAGYL) IVPB 500 mg        500 mg 100 mL/hr over 60 Minutes Intravenous Every 8 hours 04/10/11 1722 04/10/11 2254   04/08/11 2200   ciprofloxacin (CIPRO) IVPB 400 mg  Status:  Discontinued        400 mg 200 mL/hr over 60 Minutes Intravenous Every 12 hours 04/08/11 2013 04/10/11 1722   04/08/11 2200   metroNIDAZOLE (FLAGYL) IVPB 500 mg  Status:  Discontinued         500 mg 100 mL/hr over 60 Minutes Intravenous Every 8 hours 04/08/11 2013 04/10/11 1722          Ovidio Kin, MD, FACS Pager: [phone],   [facility] Office: [phone] 04/11/2011

## 2011-04-12 LAB — DIFFERENTIAL
Basophils Absolute: 0 10*3/uL (ref 0.0–0.1)
Basophils Relative: 0 % (ref 0–1)
Eosinophils Absolute: 0.2 10*3/uL (ref 0.0–0.7)
Eosinophils Relative: 2 % (ref 0–5)
Lymphocytes Relative: 13 % (ref 12–46)
Lymphs Abs: 1.1 10*3/uL (ref 0.7–4.0)
Monocytes Absolute: 0.6 10*3/uL (ref 0.1–1.0)
Monocytes Relative: 7 % (ref 3–12)
Neutro Abs: 6.3 10*3/uL (ref 1.7–7.7)
Neutrophils Relative %: 77 % (ref 43–77)

## 2011-04-12 LAB — BASIC METABOLIC PANEL
BUN: 10 mg/dL (ref 6–23)
CO2: 27 mEq/L (ref 19–32)
Calcium: 8 mg/dL — ABNORMAL LOW (ref 8.4–10.5)
Chloride: 105 mEq/L (ref 96–112)
Creatinine, Ser: 0.67 mg/dL (ref 0.50–1.10)
GFR calc Af Amer: >90 mL/min (ref >90)
GFR calc non Af Amer: >90 mL/min (ref >90)
Glucose, Bld: 128 mg/dL — ABNORMAL HIGH (ref 70–99)
Potassium: 3.7 mEq/L (ref 3.5–5.1)
Sodium: 136 mEq/L (ref 135–145)

## 2011-04-12 LAB — CBC
HCT: 28.6 % — ABNORMAL LOW (ref 36.0–46.0)
Hemoglobin: 9.5 g/dL — ABNORMAL LOW (ref 12.0–15.0)
MCH: 32.9 pg (ref 26.0–34.0)
MCHC: 33.2 g/dL (ref 30.0–36.0)
MCV: 99 fL (ref 78.0–100.0)
Platelets: 175 10*3/uL (ref 150–400)
RBC: 2.89 MIL/uL — ABNORMAL LOW (ref 3.87–5.11)
RDW: 13.2 % (ref 11.5–15.5)
WBC: 8.2 10*3/uL (ref 4.0–10.5)

## 2011-04-12 MED ORDER — HYDROMORPHONE 0.3 MG/ML IV SOLN
INTRAVENOUS | Status: AC
  Filled 2011-04-12: qty 25

## 2011-04-12 MED ORDER — KCL IN DEXTROSE-NACL 20-5-0.45 MEQ/L-%-% IV SOLN
INTRAVENOUS | Status: DC
  Administered 2011-04-12 – 2011-04-18 (×13): via INTRAVENOUS
  Filled 2011-04-12 (×23): qty 1000

## 2011-04-12 NOTE — Evaluation (Signed)
Physical Therapy Evaluation Patient Details Name: Kimberly [name] MRN: [id number] DOB: 05-May-1950 Today's Date: 04/12/2011  Problem List:  Patient Active Problem List  Diagnoses  . TOBACCO ABUSE  . DENTAL PAIN  . MENOPAUSE, SURGICAL  . LYMPHADENOPATHY  . HYPERCHOLESTEROLEMIA  . INTERMITTENT CLAUDICATION  . CYSTITIS, ACUTE  . PAP SMEAR, LGSIL, ABNORMAL  . Subclavian artery stenosis  . PVD (peripheral vascular disease) with claudication  . Atherosclerosis of native arteries of the extremities with intermittent claudication  . Occlusion and stenosis of carotid artery without mention of cerebral infarction  . Abdominal pain  . Ileus  . PVD (peripheral vascular disease)  . Mesenteric artery stenosis    Past Medical History:  Past Medical History  Diagnosis Date  . Uterine cancer   . Asthma   . Hyperlipidemia   . History of lead poisoning 1977  . Shortness of breath   . Peripheral vascular disease     claudication  right greater than left  femoral artery  . Anxiety   . Angina   . Blood transfusion   . Migraine   . Subclavian artery disease     left  . Small bowel obstruction, partial 04/08/11   Past Surgical History:  Past Surgical History  Procedure Date  . Abdominal hysterectomy   . Abdominal angiogram   . Aorta - bilateral femoral artery bypass graft 02/26/2011    Procedure: AORTA BIFEMORAL BYPASS GRAFT;  Surgeon: Juleen China, MD;  Location: [facility] OR;  Service: Vascular;  Laterality: N/A;  . Pr vein bypass graft,aorto-fem-pop 02/26/11  . Laparotomy 04/10/2011    Procedure: EXPLORATORY LAPAROTOMY;  Surgeon: Kandis Cocking, MD;  Location: [facility] OR;  Service: General;  Laterality: N/A;  ENTEROLYSIS OF ADHESIONS    PT Assessment/Plan/Recommendation PT Assessment Clinical Impression Statement: Pt admitted for SBO and lysis of adhesions from sx in feb for aortobifem BPG. Familiar with pt from last admission who had returned to performing own ADLs and walking without a walker. Pt  with limited mobility currently secondary to pain and benefits from encouragement but slow to mobilize. Will follow acutely to maximize gait, transfers, and mobility to return pt to PLOF so she can care for grandchildren at home (4, 9yo).  PT Recommendation/Assessment: Patient will need skilled PT in the acute care venue PT Problem List: Decreased activity tolerance;Decreased mobility;Pain;Decreased knowledge of use of DME Barriers to Discharge: Decreased caregiver support Barriers to Discharge Comments: Dgtr at home currently but pt states she is not extremely reliable PT Therapy Diagnosis : Abnormality of gait;Difficulty walking;Acute pain PT Plan PT Frequency: Min 3X/week PT Treatment/Interventions: Gait training;DME instruction;Stair training;Functional mobility training;Therapeutic activities;Therapeutic exercise;Patient/family education PT Recommendation Recommendations for Other Services: OT consult Follow Up Recommendations: Home health PT;Supervision for mobility/OOB Equipment Recommended: None recommended by PT PT Goals  Acute Rehab PT Goals PT Goal Formulation: With patient Time For Goal Achievement: 2 weeks Pt will go Supine/Side to Sit: with supervision PT Goal: Supine/Side to Sit - Progress: Goal set today Pt will go Sit to Supine/Side: with supervision PT Goal: Sit to Supine/Side - Progress: Goal set today Pt will go Sit to Stand: with supervision PT Goal: Sit to Stand - Progress: Goal set today Pt will go Stand to Sit: with supervision PT Goal: Stand to Sit - Progress: Goal set today Pt will Ambulate: >150 feet;with supervision;with least restrictive assistive device PT Goal: Ambulate - Progress: Goal set today Pt will Go Up / Down Stairs: 1-2 stairs;with least restrictive assistive device;with min assist  PT Goal: Up/Down Stairs - Progress: Goal set today  PT Evaluation Precautions/Restrictions    Prior Functioning  Home Living Lives With: Other (Comment)  (grandchildren, dgtr at home since last sx) Receives Help From: Family Type of Home: House Home Layout: One level Home Access: Stairs to enter Entrance Stairs-Rails: None (post she can hold) Secretary/administrator of Steps: 1 Bathroom Shower/Tub: Engineer, manufacturing systems: Standard Home Adaptive Equipment: Walker - rolling Prior Function Level of Independence: Independent with basic ADLs;Independent with transfers;Independent with gait;Independent with homemaking with ambulation Driving: Yes Cognition Cognition Arousal/Alertness: Awake/alert Overall Cognitive Status: Appears within functional limits for tasks assessed Orientation Level: Oriented X4 Sensation/Coordination Sensation Light Touch: Not tested Extremity Assessment   Mobility (including Balance) Bed Mobility Bed Mobility: Yes Rolling Left: 3: Mod assist Rolling Left Details (indicate cue type and reason): cueing to sequence and facilitation of pelvis with pad Left Sidelying to Sit: HOB flat;3: Mod assist Left Sidelying to Sit Details (indicate cue type and reason): increased time to complete and sequence for cueing Sitting - Scoot to Edge of Bed: 6: Modified independent (Device/Increase time) Transfers Transfers: Yes Sit to Stand: 4: Min assist;From bed Sit to Stand Details (indicate cue type and reason): cueing for hand placement and assist for anterior translation Stand to Sit: 5: Supervision Stand to Sit Details: cueing for control of descent and safety Ambulation/Gait Ambulation/Gait: Yes Ambulation/Gait Assistance: 4: Min assist Ambulation/Gait Assistance Details (indicate cue type and reason): minguard with cueing to step into RW and increase step length. Pt with very short steps and decreased speed grossly 80ft/min Ambulation Distance (Feet): 10 Feet Assistive device: Rolling walker Gait Pattern: Decreased step length - right;Decreased step length - left;Trunk flexed Gait velocity: 1 ft/60 sec= high  fall risk  Posture/Postural Control Posture/Postural Control: Postural limitations Postural Limitations: flexed trunk secondary to pain Balance Balance Assessed: No Exercise    End of Session PT - End of Session Activity Tolerance: Patient limited by pain Patient left: in chair;Other (comment) (with RN in [facility] for transfer to 5000) Nurse Communication: Mobility status for transfers;Mobility status for ambulation General Behavior During Session: [facility] for tasks performed Cognition: [facility] for tasks performed  Delorse Lek 04/12/2011, 12:24 PM  Delaney Meigs, PT [phone]

## 2011-04-12 NOTE — Progress Notes (Signed)
Patient ID: Kimberly [name], female   DOB: [birth year], 61 y.o.   MRN: [id number] 2 Days Post-Op  Subjective: Pt doesn't want foley out because she doesn't want to ambulate.  She reports no flatus yet.  C/o some pain, but PCA is working.  Objective: Vital signs in last 24 hours: Temp:  [98.2 F (36.8 C)-99.2 F (37.3 C)] 98.5 F (36.9 C) (04/05 0733) Pulse Rate:  [95-111] 95  (04/05 0800) Resp:  [7-20] 9  (04/05 0800) BP: (99-149)/(42-78) 119/49 mmHg (04/05 0800) SpO2:  [94 %-98 %] 94 % (04/05 0800) Weight:  [149 lb 11.1 oz (67.9 kg)] 149 lb 11.1 oz (67.9 kg) (04/05 0600) Last BM Date: 04/08/11  Intake/Output from previous day: 04/04 0701 - 04/05 0700 In: 3650 [I.V.:3500; NG/GT:140; IV Piggyback:10] Out: 1260 [Urine:1060; Emesis/NG output:200] Intake/Output this shift: Total I/O In: 158.8 [I.V.:158.8] Out: 75 [Urine:75]  PE: Abd: soft, tender, few BS, NGT with some bilious output.  Minimal distention.  Incision is c/d/i with staples.  Small amount of surrounding ecchymosis.  Lab Results:   Basename 04/12/11 0336 04/11/11 0400  WBC 8.2 8.8  HGB 9.5* 13.0  HCT 28.6* 38.4  PLT 175 217   BMET  Basename 04/12/11 0336 04/11/11 0400  NA 136 133*  K 3.7 4.7  CL 105 102  CO2 27 25  GLUCOSE 128* 139*  BUN 10 7  CREATININE 0.67 0.90  CALCIUM 8.0* 8.1*   PT/INR No results found for this basename: LABPROT:2,INR:2 in the last 72 hours CMP     Component Value Date/Time   NA 136 04/12/2011 0336   K 3.7 04/12/2011 0336   CL 105 04/12/2011 0336   CO2 27 04/12/2011 0336   GLUCOSE 128* 04/12/2011 0336   BUN 10 04/12/2011 0336   CREATININE 0.67 04/12/2011 0336   CALCIUM 8.0* 04/12/2011 0336   PROT 4.7* 04/11/2011 0400   ALBUMIN 2.2* 04/11/2011 0400   AST 15 04/11/2011 0400   ALT 11 04/11/2011 0400   ALKPHOS 63 04/11/2011 0400   BILITOT 0.7 04/11/2011 0400   GFRNONAA >90 04/12/2011 0336   GFRAA >90 04/12/2011 0336   Lipase     Component Value Date/Time   LIPASE 45 04/08/2011 1343        Studies/Results: Dg Chest Port 1 View  04/10/2011  *RADIOLOGY REPORT*  Clinical Data: Post bowel resection, right jugular line placement  PORTABLE CHEST - 1 VIEW  Comparison: Portable exam 1550 hours compared to 04/08/2011  Findings: Nasogastric tube coiled in stomach. New right jugular central venous catheter, tip projecting over SVC. Normal heart size, mediastinal contours and pulmonary vascularity for technique. Bibasilar atelectasis. Minimal bronchitic changes. Remaining lungs clear. No definite pleural effusion or pneumothorax. Minimal atherosclerotic calcification aorta. No acute osseous findings.  IMPRESSION: No pneumothorax following central line insertion. Bibasilar atelectasis and minimal bronchitic changes.  Original Report Authenticated By: Lollie Marrow, M.D.    Anti-infectives: Anti-infectives     Start     Dose/Rate Route Frequency Ordered Stop   04/10/11 2200   ciprofloxacin (CIPRO) IVPB 400 mg        400 mg 200 mL/hr over 60 Minutes Intravenous Every 12 hours 04/10/11 1722 04/10/11 2353   04/10/11 2200   metroNIDAZOLE (FLAGYL) IVPB 500 mg        500 mg 100 mL/hr over 60 Minutes Intravenous Every 8 hours 04/10/11 1722 04/10/11 2254   04/08/11 2200   ciprofloxacin (CIPRO) IVPB 400 mg  Status:  Discontinued  400 mg 200 mL/hr over 60 Minutes Intravenous Every 12 hours 04/08/11 2013 04/10/11 1722   04/08/11 2200   metroNIDAZOLE (FLAGYL) IVPB 500 mg  Status:  Discontinued        500 mg 100 mL/hr over 60 Minutes Intravenous Every 8 hours 04/08/11 2013 04/10/11 1722           Assessment/Plan  1. Closed loop SBO 2. S/p ex lap with LOA 3. Post op ileus 4. PVD  Plan: 1. Foley will come out today.  2. PT consult for mobilization, the patient states she feels unstable on her feet, but I think she will be difficult to ambulate without the assistance of PT at first 3. Cont to await bowel function 4. Patient is stable for transfer back to the floor from out  standpoint.  She would benefit from transfer to the surgical floor like 5100, but since she is on the medicine service they did request she come back to 5500 when stable.  Defer to primary service.   LOS: 4 days    OSBORNE,KELLY E 04/12/2011  Doing well medically.  Wants to drink water, but she still has an ileus. Keep NGT.  She is on ice chips.  Will transfer to 5100. I spoke to Dr. Sharon Seller and accepted her on our service.  Ovidio Kin, MD, [facility] Surgery Pager: [phone] Office phone:  [phone]

## 2011-04-13 DIAGNOSIS — E876 Hypokalemia: Secondary | ICD-10-CM

## 2011-04-13 LAB — BASIC METABOLIC PANEL
BUN: 7 mg/dL (ref 6–23)
CO2: 28 mEq/L (ref 19–32)
Calcium: 8.1 mg/dL — ABNORMAL LOW (ref 8.4–10.5)
Chloride: 102 mEq/L (ref 96–112)
Creatinine, Ser: 0.45 mg/dL — ABNORMAL LOW (ref 0.50–1.10)
GFR calc Af Amer: >90 mL/min (ref >90)
GFR calc non Af Amer: >90 mL/min (ref >90)
Glucose, Bld: 113 mg/dL — ABNORMAL HIGH (ref 70–99)
Potassium: 3.2 mEq/L — ABNORMAL LOW (ref 3.5–5.1)
Sodium: 136 mEq/L (ref 135–145)

## 2011-04-13 LAB — CBC
HCT: 25.5 % — ABNORMAL LOW (ref 36.0–46.0)
Hemoglobin: 8.7 g/dL — ABNORMAL LOW (ref 12.0–15.0)
MCH: 32.8 pg (ref 26.0–34.0)
MCHC: 34.1 g/dL (ref 30.0–36.0)
MCV: 96.2 fL (ref 78.0–100.0)
Platelets: 175 K/uL (ref 150–400)
RBC: 2.65 MIL/uL — ABNORMAL LOW (ref 3.87–5.11)
RDW: 13 % (ref 11.5–15.5)
WBC: 7.9 K/uL (ref 4.0–10.5)

## 2011-04-13 MED ORDER — SODIUM CHLORIDE 0.9 % IJ SOLN
10.0000 mL | INTRAMUSCULAR | Status: DC | PRN
  Administered 2011-04-13 – 2011-04-19 (×12): 10 mL
  Administered 2011-04-19: 3 mL
  Administered 2011-04-19 (×2): 10 mL
  Administered 2011-04-20: 20 mL

## 2011-04-13 MED ORDER — POTASSIUM CHLORIDE 10 MEQ/50ML IV SOLN
10.0000 meq | INTRAVENOUS | Status: AC
  Administered 2011-04-13 (×6): 10 meq via INTRAVENOUS
  Filled 2011-04-13 (×6): qty 50

## 2011-04-13 MED ORDER — HYDROMORPHONE 0.3 MG/ML IV SOLN
INTRAVENOUS | Status: AC
  Filled 2011-04-13: qty 25

## 2011-04-13 MED ORDER — PHENOL 1.4 % MT LIQD: 1.0000 | OROMUCOSAL | Status: DC | PRN

## 2011-04-13 NOTE — Progress Notes (Signed)
[facility]  Daily Progress Note  Assessment/Planning: POD #3 s/p LOA, Xlap   Awaiting rtn of bowel function (narcotic use via PCA likely slowing down recovery)  Dr. Myra Gianotti will be by on Monday  Subjective  - 3 Days Post-Op  Still c/o abd pain  Objective Filed Vitals:   04/13/11 0012 04/13/11 0406 04/13/11 0558 04/13/11 0800  BP:   136/53   Pulse:   98   Temp:   98.2 F (36.8 C)   TempSrc:      Resp: 10 12 18 20   Height:      Weight:      SpO2: 97% 99% 94% 100%    Intake/Output Summary (Last 24 hours) at 04/13/11 0919 Last data filed at 04/13/11 8119  Gross per 24 hour  Intake 2677.75 ml  Output    310 ml  Net 2367.75 ml    PULM  CTAB CV  RRR GI  soft, appropriately TTP, distension decreased, -G/R, midline bandaged, NGT in place, few BS VASC  B femoral palpable, B feet viable  Laboratory CBC    Component Value Date/Time   WBC 7.9 04/13/2011 0546   HGB 8.7* 04/13/2011 0546   HCT 25.5* 04/13/2011 0546   PLT 175 04/13/2011 0546    BMET    Component Value Date/Time   NA 136 04/13/2011 0546   K 3.2* 04/13/2011 0546   CL 102 04/13/2011 0546   CO2 28 04/13/2011 0546   GLUCOSE 113* 04/13/2011 0546   BUN 7 04/13/2011 0546   CREATININE 0.45* 04/13/2011 0546   CALCIUM 8.1* 04/13/2011 0546   GFRNONAA >90 04/13/2011 0546   GFRAA >90 04/13/2011 0546    Leonides Sake, MD [facility] [place] Office: [phone] Pager: [phone]  04/13/2011, 9:19 AM

## 2011-04-13 NOTE — Progress Notes (Signed)
3 Days Post-Op  Subjective: Has quite a few complaints.  Complains of throat discomfort, pain with moving around, difficulty getting positioned in bed, etc.  No flatus or BM yet.  Objective: Vital signs in last 24 hours: Temp:  [97.6 F (36.4 C)-98.3 F (36.8 C)] 98.2 F (36.8 C) (04/06 0558) Pulse Rate:  [83-98] 98  (04/06 0558) Resp:  [9-20] 20  (04/06 0800) BP: (100-136)/(47-90) 136/53 mmHg (04/06 0558) SpO2:  [94 %-100 %] 100 % (04/06 0800) Last BM Date: 04/08/11  Intake/Output from previous day: 04/05 0701 - 04/06 0700 In: 2986.6 [I.V.:2986.6] Out: 385 [Urine:385] Intake/Output this shift:    General appearance: alert, cooperative and mild distress GI: soft, distended, approp tender, dressing clean.  Lab Results:   Basename 04/13/11 0546 04/12/11 0336  WBC 7.9 8.2  HGB 8.7* 9.5*  HCT 25.5* 28.6*  PLT 175 175   BMET  Basename 04/13/11 0546 04/12/11 0336  NA 136 136  K 3.2* 3.7  CL 102 105  CO2 28 27  GLUCOSE 113* 128*  BUN 7 10  CREATININE 0.45* 0.67  CALCIUM 8.1* 8.0*   PT/INR No results found for this basename: LABPROT:2,INR:2 in the last 72 hours ABG No results found for this basename: PHART:2,PCO2:2,PO2:2,HCO3:2 in the last 72 hours  Studies/Results: No results found.  Anti-infectives: Anti-infectives     Start     Dose/Rate Route Frequency Ordered Stop   04/10/11 2200   ciprofloxacin (CIPRO) IVPB 400 mg        400 mg 200 mL/hr over 60 Minutes Intravenous Every 12 hours 04/10/11 1722 04/10/11 2353   04/10/11 2200   metroNIDAZOLE (FLAGYL) IVPB 500 mg        500 mg 100 mL/hr over 60 Minutes Intravenous Every 8 hours 04/10/11 1722 04/10/11 2254   04/08/11 2200   ciprofloxacin (CIPRO) IVPB 400 mg  Status:  Discontinued        400 mg 200 mL/hr over 60 Minutes Intravenous Every 12 hours 04/08/11 2013 04/10/11 1722   04/08/11 2200   metroNIDAZOLE (FLAGYL) IVPB 500 mg  Status:  Discontinued        500 mg 100 mL/hr over 60 Minutes Intravenous  Every 8 hours 04/08/11 2013 04/10/11 1722          Assessment/Plan: s/p Procedure(s) (LRB): EXPLORATORY LAPAROTOMY (N/A) Continue NGT. Replete hypokalemia. Recheck in AM ABL/dilutional anemia.  LOS: 5 days    [facility] 04/13/2011

## 2011-04-13 NOTE — Progress Notes (Signed)
Pt walked some in room but had to really push her to and she sit up in chair.

## 2011-04-14 LAB — CBC
HCT: 27.1 % — ABNORMAL LOW (ref 36.0–46.0)
Hemoglobin: 9.4 g/dL — ABNORMAL LOW (ref 12.0–15.0)
MCH: 32.6 pg (ref 26.0–34.0)
MCHC: 34.7 g/dL (ref 30.0–36.0)
MCV: 94.1 fL (ref 78.0–100.0)
Platelets: 206 10*3/uL (ref 150–400)
RBC: 2.88 MIL/uL — ABNORMAL LOW (ref 3.87–5.11)
RDW: 12.7 % (ref 11.5–15.5)
WBC: 7 10*3/uL (ref 4.0–10.5)

## 2011-04-14 LAB — BASIC METABOLIC PANEL
BUN: 4 mg/dL — ABNORMAL LOW (ref 6–23)
CO2: 32 mEq/L (ref 19–32)
Calcium: 8.7 mg/dL (ref 8.4–10.5)
Chloride: 101 mEq/L (ref 96–112)
Creatinine, Ser: 0.46 mg/dL — ABNORMAL LOW (ref 0.50–1.10)
GFR calc Af Amer: >90 mL/min (ref >90)
GFR calc non Af Amer: >90 mL/min (ref >90)
Glucose, Bld: 106 mg/dL — ABNORMAL HIGH (ref 70–99)
Potassium: 3.5 mEq/L (ref 3.5–5.1)
Sodium: 138 mEq/L (ref 135–145)

## 2011-04-14 LAB — URINALYSIS, MICROSCOPIC ONLY
Bilirubin Urine: NEGATIVE
Glucose, UA: NEGATIVE mg/dL
Ketones, ur: NEGATIVE mg/dL
Leukocytes, UA: NEGATIVE
Nitrite: NEGATIVE
Protein, ur: NEGATIVE mg/dL
Specific Gravity, Urine: 1.008 (ref 1.005–1.030)
Urobilinogen, UA: 0.2 mg/dL (ref 0.0–1.0)
pH: 8 (ref 5.0–8.0)

## 2011-04-14 MED ORDER — HYDROMORPHONE 0.3 MG/ML IV SOLN
INTRAVENOUS | Status: AC
  Administered 2011-04-14: 2.36 mg via INTRAVENOUS
  Filled 2011-04-14: qty 25

## 2011-04-14 NOTE — Progress Notes (Signed)
4 Days Post-Op  Subjective: Complains of ng, everything swelling,  No flatus or bm  Objective: Vital signs in last 24 hours: Temp:  [97.2 F (36.2 C)-98.5 F (36.9 C)] 97.9 F (36.6 C) (04/07 0507) Pulse Rate:  [71-82] 74  (04/07 0507) Resp:  [12-18] 18  (04/07 0620) BP: (118-140)/(46-76) 140/49 mmHg (04/07 0507) SpO2:  [97 %-100 %] 97 % (04/07 0620) FiO2 (%):  [95 %] 95 % (04/06 2136) Last BM Date: 04/08/11  Intake/Output from previous day: 04/06 0701 - 04/07 0700 In: 0  Out: 1100 [Urine:1100] Intake/Output this shift:    Lungs clear Abdomen soft, quiet Incision stable  Lab Results:   Basename 04/14/11 0645 04/13/11 0546  WBC 7.0 7.9  HGB 9.4* 8.7*  HCT 27.1* 25.5*  PLT 206 175   BMET  Basename 04/14/11 0645 04/13/11 0546  NA 138 136  K 3.5 3.2*  CL 101 102  CO2 32 28  GLUCOSE 106* 113*  BUN 4* 7  CREATININE 0.46* 0.45*  CALCIUM 8.7 8.1*   PT/INR No results found for this basename: LABPROT:2,INR:2 in the last 72 hours ABG No results found for this basename: PHART:2,PCO2:2,PO2:2,HCO3:2 in the last 72 hours  Studies/Results: No results found.  Anti-infectives: Anti-infectives     Start     Dose/Rate Route Frequency Ordered Stop   04/10/11 2200   ciprofloxacin (CIPRO) IVPB 400 mg        400 mg 200 mL/hr over 60 Minutes Intravenous Every 12 hours 04/10/11 1722 04/10/11 2353   04/10/11 2200   metroNIDAZOLE (FLAGYL) IVPB 500 mg        500 mg 100 mL/hr over 60 Minutes Intravenous Every 8 hours 04/10/11 1722 04/10/11 2254   04/08/11 2200   ciprofloxacin (CIPRO) IVPB 400 mg  Status:  Discontinued        400 mg 200 mL/hr over 60 Minutes Intravenous Every 12 hours 04/08/11 2013 04/10/11 1722   04/08/11 2200   metroNIDAZOLE (FLAGYL) IVPB 500 mg  Status:  Discontinued        500 mg 100 mL/hr over 60 Minutes Intravenous Every 8 hours 04/08/11 2013 04/10/11 1722          Assessment/Plan: s/p Procedure(s) (LRB): EXPLORATORY LAPAROTOMY (N/A)  Postop  ileus Continue NPO and NG  LOS: 6 days    Zyaira Vejar A 04/14/2011

## 2011-04-15 LAB — CREATININE, SERUM
Creatinine, Ser: 0.46 mg/dL — ABNORMAL LOW (ref 0.50–1.10)
GFR calc Af Amer: >90 mL/min (ref >90)
GFR calc non Af Amer: >90 mL/min (ref >90)

## 2011-04-15 MED ORDER — HYDROMORPHONE 0.3 MG/ML IV SOLN
INTRAVENOUS | Status: AC
  Administered 2011-04-15: 17:00:00
  Filled 2011-04-15: qty 25

## 2011-04-15 MED ORDER — HYDROMORPHONE 0.3 MG/ML IV SOLN
INTRAVENOUS | Status: AC
  Filled 2011-04-15: qty 25

## 2011-04-15 NOTE — Progress Notes (Signed)
Patient ID: Kimberly [name], female   DOB: 10/21/1950, 61 y.o.   MRN: [id number] 5 Days Post-Op  Subjective: Pt still on PCA, complains of pain no better. +flatus, no stool yet. C/o pain in RLQ during urination. No nausea or vomiting.   Objective: Vital signs in last 24 hours: Temp:  [97.3 F (36.3 C)-99.2 F (37.3 C)] 97.6 F (36.4 C) (04/08 0551) Pulse Rate:  [71-83] 74  (04/08 0551) Resp:  [11-21] 18  (04/08 0551) BP: (112-148)/(48-63) 144/48 mmHg (04/08 0551) SpO2:  [96 %-100 %] 100 % (04/08 0551) Last BM Date: 04/08/11  Intake/Output from previous day: 04/07 0701 - 04/08 0700 In: 700 [I.V.:600; NG/GT:100] Out: -  Intake/Output this shift:    PE: GEN: NG tube in place. Lying flat in bed CV: RRR, no murmur PULM: CTAB Abd: soft, mildly distended, BS present, NGT with minimal output.  Incision is c/d/i with staples.     Lab Results:   Basename 04/14/11 0645 04/13/11 0546  WBC 7.0 7.9  HGB 9.4* 8.7*  HCT 27.1* 25.5*  PLT 206 175   BMET  Basename 04/14/11 0645 04/13/11 0546  NA 138 136  K 3.5 3.2*  CL 101 102  CO2 32 28  GLUCOSE 106* 113*  BUN 4* 7  CREATININE 0.46* 0.45*  CALCIUM 8.7 8.1*   PT/INR No results found for this basename: LABPROT:2,INR:2 in the last 72 hours CMP     Component Value Date/Time   NA 138 04/14/2011 0645   K 3.5 04/14/2011 0645   CL 101 04/14/2011 0645   CO2 32 04/14/2011 0645   GLUCOSE 106* 04/14/2011 0645   BUN 4* 04/14/2011 0645   CREATININE 0.46* 04/14/2011 0645   CALCIUM 8.7 04/14/2011 0645   PROT 4.7* 04/11/2011 0400   ALBUMIN 2.2* 04/11/2011 0400   AST 15 04/11/2011 0400   ALT 11 04/11/2011 0400   ALKPHOS 63 04/11/2011 0400   BILITOT 0.7 04/11/2011 0400   GFRNONAA >90 04/14/2011 0645   GFRAA >90 04/14/2011 0645   Lipase     Component Value Date/Time   LIPASE 45 04/08/2011 1343    Studies/Results: No results found.  Anti-infectives: Anti-infectives     Start     Dose/Rate Route Frequency Ordered Stop   04/10/11 2200   ciprofloxacin (CIPRO)  IVPB 400 mg        400 mg 200 mL/hr over 60 Minutes Intravenous Every 12 hours 04/10/11 1722 04/10/11 2353   04/10/11 2200   metroNIDAZOLE (FLAGYL) IVPB 500 mg        500 mg 100 mL/hr over 60 Minutes Intravenous Every 8 hours 04/10/11 1722 04/10/11 2254   04/08/11 2200   ciprofloxacin (CIPRO) IVPB 400 mg  Status:  Discontinued        400 mg 200 mL/hr over 60 Minutes Intravenous Every 12 hours 04/08/11 2013 04/10/11 1722   04/08/11 2200   metroNIDAZOLE (FLAGYL) IVPB 500 mg  Status:  Discontinued        500 mg 100 mL/hr over 60 Minutes Intravenous Every 8 hours 04/08/11 2013 04/10/11 1722            . bupivacaine liposome  20 mL Infiltration Once  . heparin  5,000 Units Subcutaneous Q8H  . HYDROmorphone PCA 0.3 mg/mL   Intravenous Q4H  . HYDROmorphone PCA 0.3 mg/mL      . pantoprazole (PROTONIX) IV  40 mg Intravenous Q24H    Assessment/Plan  1. Closed loop SBO 2. S/p ex lap with LOA 3.  Post op ileus 4. PVD  Plan: 1. PT consult for mobilization 2. Consider initiation of clears diet today, DC NG tube. 3. Dispo pending return of bowel function, pain control.    LOS: 7 days    Lloyd Huger, MD [facility] Resident - PGY-2 04/15/2011 8:23 AM

## 2011-04-15 NOTE — Progress Notes (Signed)
Physical Therapy Treatment Patient Details Name: Kimberly [name] MRN: [id number] DOB: 12/01/1950 Today's Date: 04/15/2011  PT Assessment/Plan  PT - Assessment/Plan Comments on Treatment Session: Pt performed transfers with decrease (A) from clinician today but did not ambulate due to NG tube & connected to wall suction.   PT Frequency: Min 3X/week Follow Up Recommendations: Home health PT;Supervision for mobility/OOB Equipment Recommended: None recommended by PT PT Goals  Acute Rehab PT Goals PT Goal: Supine/Side to Sit - Progress: Progressing toward goal PT Goal: Sit to Stand - Progress: Progressing toward goal PT Goal: Stand to Sit - Progress: Progressing toward goal  PT Treatment Precautions/Restrictions  Restrictions Weight Bearing Restrictions: No Mobility (including Balance) Bed Mobility Supine to Sit: 4: Min assist Supine to Sit Details (indicate cue type and reason): Encouraged pt to use log rolling technique to decrease pain level.  (A) to bring shoulders/trunk to sitting upright, & (A) for safety due to multiple lines.   Sitting - Scoot to Edge of Bed: 6: Modified independent (Device/Increase time) Transfers Sit to Stand: From bed;From chair/3-in-1;With upper extremity assist;Other (comment) (Min Guard (A)) Sit to Stand Details (indicate cue type and reason): Guarding for safety  Stand to Sit: Other (comment);To chair/3-in-1;With upper extremity assist;With armrests (Min Guard (A)) Stand to Sit Details: Guarding for safety.   Stand Pivot Transfers: 4: Min assist Stand Pivot Transfer Details (indicate cue type and reason): (A) for balance & UE support.  Transferred bed>3-1>Recliner.   Ambulation/Gait Ambulation/Gait: No (Did not ambulate due to be connected to wall suction)  Balance Balance Assessed: No Exercise  General Exercises - Lower Extremity Ankle Circles/Pumps: AROM;Both;10 reps;Seated Quad Sets: AROM;Strengthening;Both;10 reps;Seated Gluteal Sets:  AROM;Strengthening;Both;10 reps;Seated Long Arc Quad: AROM;Strengthening;Both;10 reps;Seated Hip ABduction/ADduction: AAROM;Strengthening;Both;10 reps;Seated End of Session PT - End of Session Activity Tolerance: Patient limited by pain;Other (comment) (NG tube & connected to wall suction) General Behavior During Session: [facility] for tasks performed Cognition: [facility] for tasks performed  Lara Mulch 04/15/2011, 2:01 PM [phone]

## 2011-04-15 NOTE — Progress Notes (Signed)
Passing flatus so will start clears Patient examined and I agree with the assessment and plan  Violeta Gelinas, MD, MPH, FACS Pager: [phone]  04/15/2011 12:53 PM

## 2011-04-15 NOTE — Progress Notes (Signed)
Nutrition Follow-up  Diet Order:  NPO  NGT removed this am.  Pt states that she is sore, but feels better without tube.   Pt is taking ice chips which she reports she is tolerating.  Pt states she is tired, does not have much of an appetite but is thirsty.    Pt reports pain she feels is similar to a UTI.  She has reported this pain to MD and nursing staff.    Meds: Scheduled Meds:   . bupivacaine liposome  20 mL Infiltration Once  . heparin  5,000 Units Subcutaneous Q8H  . HYDROmorphone PCA 0.3 mg/mL   Intravenous Q4H  . HYDROmorphone PCA 0.3 mg/mL      . pantoprazole (PROTONIX) IV  40 mg Intravenous Q24H   Continuous Infusions:   . dextrose 5 % and 0.45 % NaCl with KCl 20 mEq/L 75 mL/hr at 04/14/11 2201   PRN Meds:.acetaminophen, acetaminophen, diphenhydrAMINE, diphenhydrAMINE, naloxone, ondansetron (ZOFRAN) IV, ondansetron, phenol, sodium chloride, sodium chloride  Labs:  CMP     Component Value Date/Time   NA 138 04/14/2011 0645   K 3.5 04/14/2011 0645   CL 101 04/14/2011 0645   CO2 32 04/14/2011 0645   GLUCOSE 106* 04/14/2011 0645   BUN 4* 04/14/2011 0645   CREATININE 0.46* 04/15/2011 0535   CALCIUM 8.7 04/14/2011 0645   PROT 4.7* 04/11/2011 0400   ALBUMIN 2.2* 04/11/2011 0400   AST 15 04/11/2011 0400   ALT 11 04/11/2011 0400   ALKPHOS 63 04/11/2011 0400   BILITOT 0.7 04/11/2011 0400   GFRNONAA >90 04/15/2011 0535   GFRAA >90 04/15/2011 0535     Intake/Output Summary (Last 24 hours) at 04/15/11 1108 Last data filed at 04/14/11 1500  Gross per 24 hour  Intake    700 ml  Output      0 ml  Net    700 ml    Weight Status:   Admission wt: 133 lbs Current wt: 149 lbs  Restatement of needs:  1500-1700 kcal, 60-70g protein  Nutrition Dx:  Inadequate oral intake, ongoing  Intervention:   1.  Modify diet; per MD discretion.  NGT removed today, taking ice chips.  Discussed likelihood of diet progression dependent on tolerance with pt.  Monitor:   1.  Energy intake; pt to meet >90% of  estimated needs once diet advances.  Not met, diet has not advanced due to bowel function slow to return.   Hoyt Koch Pager #:  [phone]

## 2011-04-16 MED ORDER — HYDROMORPHONE 0.3 MG/ML IV SOLN
INTRAVENOUS | Status: AC
  Administered 2011-04-16: 13:00:00
  Filled 2011-04-16: qty 25

## 2011-04-16 NOTE — Progress Notes (Signed)
Tolerating fulls Patient examined and I agree with the assessment and plan  Violeta Gelinas, MD, MPH, FACS Pager: [phone]  04/16/2011 3:58 PM

## 2011-04-16 NOTE — Progress Notes (Signed)
Physical Therapy Treatment Patient Details Name: SHAHD OCCHIPINTI MRN: [id number] DOB: 1951/01/06 Today's Date: 04/16/2011  PT Assessment/Plan  PT - Assessment/Plan Comments on Treatment Session: Motivated to ambulate today but reports she won't do anything more than what she knows she can do because she doesn't want to end up back in the hospital.  PT Plan: Discharge plan remains appropriate;Frequency remains appropriate Follow Up Recommendations: Home health PT;Supervision for mobility/OOB Equipment Recommended: None recommended by PT PT Goals  Acute Rehab PT Goals Pt will go Sit to Stand: with modified independence PT Goal: Sit to Stand - Progress: Updated due to goal met Pt will go Stand to Sit: with modified independence PT Goal: Stand to Sit - Progress: Updated due to goals met Pt will Ambulate: >150 feet;with modified independence;with least restrictive assistive device PT Goal: Ambulate - Progress: Updated due to goal met Pt will Go Up / Down Stairs: 1-2 stairs;with least restrictive assistive device;with min assist PT Goal: Up/Down Stairs - Progress: Progressing toward goal  PT Treatment Precautions/Restrictions  Restrictions Weight Bearing Restrictions: No Mobility (including Balance) Bed Mobility Bed Mobility: No Transfers Sit to Stand: With upper extremity assist;From elevated surface;From chair/3-in-1 (mingaurdA) Sit to Stand Details (indicate cue type and reason): cues for safe hand placement, slow to ascend and needing the RW in front of her to slowly extend as much as she could tolerate Stand to Sit: 5: Supervision;With upper extremity assist;To chair/3-in-1 Stand to Sit Details: cues for safe technique; pt with well controlled sit Ambulation/Gait Ambulation/Gait Assistance: 5: Supervision Ambulation/Gait Assistance Details (indicate cue type and reason): slow, flexed and gaurded gait; cues for upright posture but pt doesn't respond well to this because of the pain;  standing rest break against the wall after about 40 ft.; c/o pain at various moments during ambulation and knees flex needing mingaurdA Ambulation Distance (Feet): 80 Feet Assistive device: Rolling walker    Exercise    End of Session PT - End of Session Equipment Utilized During Treatment: Gait belt Activity Tolerance: Patient tolerated treatment well;Patient limited by pain Patient left: in chair;with call bell in reach General Behavior During Session: [facility] for tasks performed Cognition: [facility] for tasks performed  [facility] HELEN 04/16/2011, 2:06 PM

## 2011-04-16 NOTE — Progress Notes (Signed)
Patient ID: Kimberly [name], female   DOB: 11-Jul-1950, 61 y.o.   MRN: [id number] 6 Days Post-Op  Subjective: Pt's pain controlled ok.  Tolerating clear liquids well.  Still having flatus.  No BM yet.  Objective: Vital signs in last 24 hours: Temp:  [97.8 F (36.6 C)-98.9 F (37.2 C)] 97.8 F (36.6 C) (04/09 0152) Pulse Rate:  [68-79] 79  (04/09 0152) Resp:  [12-18] 12  (04/09 0743) BP: (122-138)/(48-58) 122/58 mmHg (04/09 0152) SpO2:  [98 %-100 %] 100 % (04/09 0743) FiO2 (%):  [98 %] 98 % (04/08 1640) Last BM Date: 04/08/11  Intake/Output from previous day: 04/08 0701 - 04/09 0700 In: 2159 [P.O.:600; I.V.:1559] Out: 650 [Urine:650] Intake/Output this shift:    PE: Abd: soft, tender, +BS, incision c/d/i with staples.  Lab Results:   Basename 04/14/11 0645  WBC 7.0  HGB 9.4*  HCT 27.1*  PLT 206   BMET  Basename 04/15/11 0535 04/14/11 0645  NA -- 138  K -- 3.5  CL -- 101  CO2 -- 32  GLUCOSE -- 106*  BUN -- 4*  CREATININE 0.46* 0.46*  CALCIUM -- 8.7   PT/INR No results found for this basename: LABPROT:2,INR:2 in the last 72 hours CMP     Component Value Date/Time   NA 138 04/14/2011 0645   K 3.5 04/14/2011 0645   CL 101 04/14/2011 0645   CO2 32 04/14/2011 0645   GLUCOSE 106* 04/14/2011 0645   BUN 4* 04/14/2011 0645   CREATININE 0.46* 04/15/2011 0535   CALCIUM 8.7 04/14/2011 0645   PROT 4.7* 04/11/2011 0400   ALBUMIN 2.2* 04/11/2011 0400   AST 15 04/11/2011 0400   ALT 11 04/11/2011 0400   ALKPHOS 63 04/11/2011 0400   BILITOT 0.7 04/11/2011 0400   GFRNONAA >90 04/15/2011 0535   GFRAA >90 04/15/2011 0535   Lipase     Component Value Date/Time   LIPASE 45 04/08/2011 1343       Studies/Results: No results found.  Anti-infectives: Anti-infectives     Start     Dose/Rate Route Frequency Ordered Stop   04/10/11 2200   ciprofloxacin (CIPRO) IVPB 400 mg        400 mg 200 mL/hr over 60 Minutes Intravenous Every 12 hours 04/10/11 1722 04/10/11 2353   04/10/11 2200   metroNIDAZOLE  (FLAGYL) IVPB 500 mg        500 mg 100 mL/hr over 60 Minutes Intravenous Every 8 hours 04/10/11 1722 04/10/11 2254   04/08/11 2200   ciprofloxacin (CIPRO) IVPB 400 mg  Status:  Discontinued        400 mg 200 mL/hr over 60 Minutes Intravenous Every 12 hours 04/08/11 2013 04/10/11 1722   04/08/11 2200   metroNIDAZOLE (FLAGYL) IVPB 500 mg  Status:  Discontinued        500 mg 100 mL/hr over 60 Minutes Intravenous Every 8 hours 04/08/11 2013 04/10/11 1722           Assessment/Plan  1. S/p ex lap with LOA 2. Post op ileus, resolved  Plan: 1. Increase to full liquids today.  Leave PCA and will d/c tomorrow.  Patient very anxious about discharge.  Hoping for this weekend, but I told her she would likely be ready towards the end of the week.   LOS: 8 days    Arneta Mahmood E 04/16/2011

## 2011-04-17 MED ORDER — HYDROMORPHONE HCL PF 1 MG/ML IJ SOLN
1.0000 mg | INTRAMUSCULAR | Status: DC | PRN
  Administered 2011-04-17 – 2011-04-18 (×4): 1 mg via INTRAVENOUS
  Filled 2011-04-17 (×3): qty 1

## 2011-04-17 MED ORDER — TRAMADOL HCL 50 MG PO TABS
50.0000 mg | ORAL_TABLET | ORAL | Status: DC | PRN
  Administered 2011-04-17 – 2011-04-19 (×8): 50 mg via ORAL
  Filled 2011-04-17 (×9): qty 1

## 2011-04-17 MED ORDER — HYDROMORPHONE 0.3 MG/ML IV SOLN
INTRAVENOUS | Status: AC
  Administered 2011-04-17: 05:00:00
  Filled 2011-04-17: qty 25

## 2011-04-17 MED ORDER — HYDROMORPHONE HCL PF 1 MG/ML IJ SOLN
INTRAMUSCULAR | Status: AC
  Filled 2011-04-17: qty 1

## 2011-04-17 NOTE — Progress Notes (Signed)
Patient ID: Kimberly [name], female   DOB: 1950/06/29, 61 y.o.   MRN: [id number] 7 Days Post-Op  Subjective: Pt still on PCA, no nausea or vomiting. Taking liquids. Having much UOP >2 L. Has been out of bed to use bathroom many times overnight. +flatus, no BM since operation.   Objective: Vital signs in last 24 hours: Temp:  [97.5 F (36.4 C)-98.8 F (37.1 C)] 97.6 F (36.4 C) (04/10 0700) Pulse Rate:  [67-86] 76  (04/10 0700) Resp:  [9-22] 12  (04/10 0812) BP: (115-160)/(46-59) 160/59 mmHg (04/10 0700) SpO2:  [88 %-100 %] 98 % (04/10 0812) FiO2 (%):  [100 %] 100 % (04/09 2024) Last BM Date: 04/08/11  Intake/Output from previous day: 04/09 0701 - 04/10 0700 In: 480 [P.O.:480] Out: 2050 [Urine:2050] Intake/Output this shift:    PE: GEN: Lying flat in bed CV: RRR, no murmur PULM: CTAB Abd: soft, mildly distended, BS present. TTP in RLQ, flank.  Incision is c/d/i with staples.     Lab Results:  No results found for this basename: WBC:2,HGB:2,HCT:2,PLT:2 in the last 72 hours BMET  Basename 04/15/11 0535  NA --  K --  CL --  CO2 --  GLUCOSE --  BUN --  CREATININE 0.46*  CALCIUM --   PT/INR No results found for this basename: LABPROT:2,INR:2 in the last 72 hours CMP     Component Value Date/Time   NA 138 04/14/2011 0645   K 3.5 04/14/2011 0645   CL 101 04/14/2011 0645   CO2 32 04/14/2011 0645   GLUCOSE 106* 04/14/2011 0645   BUN 4* 04/14/2011 0645   CREATININE 0.46* 04/15/2011 0535   CALCIUM 8.7 04/14/2011 0645   PROT 4.7* 04/11/2011 0400   ALBUMIN 2.2* 04/11/2011 0400   AST 15 04/11/2011 0400   ALT 11 04/11/2011 0400   ALKPHOS 63 04/11/2011 0400   BILITOT 0.7 04/11/2011 0400   GFRNONAA >90 04/15/2011 0535   GFRAA >90 04/15/2011 0535   Lipase     Component Value Date/Time   LIPASE 45 04/08/2011 1343    Studies/Results: No results found.  Anti-infectives: Anti-infectives     Start     Dose/Rate Route Frequency Ordered Stop   04/10/11 2200   ciprofloxacin (CIPRO) IVPB 400 mg          400 mg 200 mL/hr over 60 Minutes Intravenous Every 12 hours 04/10/11 1722 04/10/11 2353   04/10/11 2200   metroNIDAZOLE (FLAGYL) IVPB 500 mg        500 mg 100 mL/hr over 60 Minutes Intravenous Every 8 hours 04/10/11 1722 04/10/11 2254   04/08/11 2200   ciprofloxacin (CIPRO) IVPB 400 mg  Status:  Discontinued        400 mg 200 mL/hr over 60 Minutes Intravenous Every 12 hours 04/08/11 2013 04/10/11 1722   04/08/11 2200   metroNIDAZOLE (FLAGYL) IVPB 500 mg  Status:  Discontinued        500 mg 100 mL/hr over 60 Minutes Intravenous Every 8 hours 04/08/11 2013 04/10/11 1722            . bupivacaine liposome  20 mL Infiltration Once  . heparin  5,000 Units Subcutaneous Q8H  . HYDROmorphone PCA 0.3 mg/mL   Intravenous Q4H  . HYDROmorphone PCA 0.3 mg/mL      . HYDROmorphone PCA 0.3 mg/mL      . pantoprazole (PROTONIX) IV  40 mg Intravenous Q24H    Assessment/Plan  1. Closed loop SBO 2. S/p ex lap with LOA  3. Post op ileus 4. PVD  Plan: 1. PT consult for mobilization-rec [facility] PT 2. Advance diet as tolerated from full liquids 3. DC PCA today after 1200, change to tramadol per patient request   LOS: 9 days    Lloyd Huger, MD [facility] Resident - PGY-2 04/17/2011 9:00 AM

## 2011-04-17 NOTE — Progress Notes (Signed)
Patient examined and I agree with the assessment and plan  Violeta Gelinas, MD, MPH, FACS Pager: [phone]  04/17/2011 5:35 PM

## 2011-04-17 NOTE — Progress Notes (Signed)
Pt complaining of severe pain, 8/10.  Pt last received Ultram 50 mg at 1820.  Notified MD on call, Dr. Luisa Hart, who ordered Dilaudid 1 mg IV prn.  Will continue to monitor.

## 2011-04-17 NOTE — Progress Notes (Signed)
UR of chart complete.  

## 2011-04-18 MED ORDER — BISACODYL 10 MG RE SUPP
10.0000 mg | Freq: Once | RECTAL | Status: AC
  Administered 2011-04-19: 10 mg via RECTAL
  Filled 2011-04-18 (×2): qty 1

## 2011-04-18 MED ORDER — PANTOPRAZOLE SODIUM 40 MG PO TBEC
40.0000 mg | DELAYED_RELEASE_TABLET | Freq: Every day | ORAL | Status: DC
  Administered 2011-04-18 – 2011-04-19 (×2): 40 mg via ORAL
  Filled 2011-04-18 (×3): qty 1

## 2011-04-18 MED ORDER — BISACODYL 10 MG RE SUPP
10.0000 mg | Freq: Once | RECTAL | Status: AC
  Administered 2011-04-18: 10 mg via RECTAL
  Filled 2011-04-18: qty 1

## 2011-04-18 MED ORDER — HYDROMORPHONE HCL PF 1 MG/ML IJ SOLN
0.5000 mg | INTRAMUSCULAR | Status: DC | PRN
  Administered 2011-04-18 – 2011-04-19 (×3): 0.5 mg via INTRAVENOUS
  Filled 2011-04-18 (×3): qty 1

## 2011-04-18 MED ORDER — KETOROLAC TROMETHAMINE 15 MG/ML IJ SOLN
15.0000 mg | Freq: Four times a day (QID) | INTRAMUSCULAR | Status: DC | PRN
  Administered 2011-04-18 – 2011-04-19 (×4): 15 mg via INTRAVENOUS
  Filled 2011-04-18 (×4): qty 1

## 2011-04-18 NOTE — Progress Notes (Signed)
Physical Therapy Treatment Patient Details Name: Kimberly [name] MRN: [id number] DOB: Jul 24, 1950 Today's Date: 04/18/2011  PT Assessment/Plan  PT - Assessment/Plan Comments on Treatment Session: Pt appears worried that she will be too stressed at home with 2 young boys. Reports however that she will be able to have someone take care of them for her and that she has a lot of people who can come help her out. Reports she did not have HHPT when she left last time, we will need to make sure this gets set up this time.  PT Plan: Discharge plan remains appropriate;Frequency remains appropriate Follow Up Recommendations: Home health PT;Supervision for mobility/OOB PT Goals  Acute Rehab PT Goals PT Goal: Sit to Stand - Progress: Progressing toward goal PT Goal: Stand to Sit - Progress: Met PT Goal: Ambulate - Progress: Progressing toward goal  PT Treatment Precautions/Restrictions  Restrictions Weight Bearing Restrictions: No Mobility (including Balance) Transfers Sit to Stand: 5: Supervision;From chair/3-in-1;With upper extremity assist Sit to Stand Details (indicate cue type and reason): slow to rise secondary to pain but cues only for safety ( hand placement and encouragement)  Stand to Sit: 6: Modified independent (Device/Increase time);With armrests;To chair/3-in-1 Stand to Sit Details: slow because of pain Ambulation/Gait Ambulation/Gait Assistance: 5: Supervision Ambulation/Gait Assistance Details (indicate cue type and reason): again very slow and flexed, seems a bit self limiting but reports pain is her biggest limiting factor; encouraged safe technique with RW especially not to push too far anteriorly as it will put more strain on her stomach/incisions, pt responded well to this but as she fatigues she slows and resorts back to poor technique Ambulation Distance (Feet): 100 Feet Assistive device: Rolling walker Gait Pattern: Trunk flexed;Shuffle    Exercise  General Exercises - Lower  Extremity Ankle Circles/Pumps: AROM;Both;20 reps (instruction on proper technique) End of Session PT - End of Session Equipment Utilized During Treatment: Gait belt Activity Tolerance: Patient limited by pain;Patient tolerated treatment well Patient left: in chair;with call bell in reach General Behavior During Session: [facility] for tasks performed Cognition: [facility] for tasks performed  [facility] HELEN 04/18/2011, 1:15 PM

## 2011-04-18 NOTE — Progress Notes (Signed)
Patient ID: Kimberly [name], female   DOB: [birth year], 61 y.o.   MRN: [id number] 8 Days Post-Op  Subjective: Pt still feeling a little down.  Wants to poop.  Tolerating fulls.  Pain controlled but had to have breakthrough as tramadol didn't last for full time.  Objective: Vital signs in last 24 hours: Temp:  [97.6 F (36.4 C)-98 F (36.7 C)] 97.9 F (36.6 C) (04/11 0522) Pulse Rate:  [65-78] 78  (04/11 0522) Resp:  [12-16] 16  (04/11 0522) BP: (118-132)/(43-54) 118/54 mmHg (04/11 0522) SpO2:  [91 %-99 %] 94 % (04/11 0522) Last BM Date: 04/08/11  Intake/Output from previous day: 04/10 0701 - 04/11 0700 In: 2196.6 [P.O.:480; I.V.:1716.6] Out: 1400 [Urine:1400] Intake/Output this shift: Total I/O In: -  Out: 300 [Urine:300]  PE: Abd: soft, still tender, +BS, ND, incision c/d/i  Lab Results:  No results found for this basename: WBC:2,HGB:2,HCT:2,PLT:2 in the last 72 hours BMET No results found for this basename: NA:2,K:2,CL:2,CO2:2,GLUCOSE:2,BUN:2,CREATININE:2,CALCIUM:2 in the last 72 hours PT/INR No results found for this basename: LABPROT:2,INR:2 in the last 72 hours CMP     Component Value Date/Time   NA 138 04/14/2011 0645   K 3.5 04/14/2011 0645   CL 101 04/14/2011 0645   CO2 32 04/14/2011 0645   GLUCOSE 106* 04/14/2011 0645   BUN 4* 04/14/2011 0645   CREATININE 0.46* 04/15/2011 0535   CALCIUM 8.7 04/14/2011 0645   PROT 4.7* 04/11/2011 0400   ALBUMIN 2.2* 04/11/2011 0400   AST 15 04/11/2011 0400   ALT 11 04/11/2011 0400   ALKPHOS 63 04/11/2011 0400   BILITOT 0.7 04/11/2011 0400   GFRNONAA >90 04/15/2011 0535   GFRAA >90 04/15/2011 0535   Lipase     Component Value Date/Time   LIPASE 45 04/08/2011 1343       Studies/Results: No results found.  Anti-infectives: Anti-infectives     Start     Dose/Rate Route Frequency Ordered Stop   04/10/11 2200   ciprofloxacin (CIPRO) IVPB 400 mg        400 mg 200 mL/hr over 60 Minutes Intravenous Every 12 hours 04/10/11 1722 04/10/11 2353   04/10/11 2200   metroNIDAZOLE (FLAGYL) IVPB 500 mg        500 mg 100 mL/hr over 60 Minutes Intravenous Every 8 hours 04/10/11 1722 04/10/11 2254   04/08/11 2200   ciprofloxacin (CIPRO) IVPB 400 mg  Status:  Discontinued        400 mg 200 mL/hr over 60 Minutes Intravenous Every 12 hours 04/08/11 2013 04/10/11 1722   04/08/11 2200   metroNIDAZOLE (FLAGYL) IVPB 500 mg  Status:  Discontinued        500 mg 100 mL/hr over 60 Minutes Intravenous Every 8 hours 04/08/11 2013 04/10/11 1722           Assessment/Plan  1. S/p ex lap with LOA 2. Post-op ileus, resolving  Plan: 1. Advance to regular diet 2. Dulcolax suppository today 3. Decrease breakthrough dilaudid, add toradol for assistance.   LOS: 10 days    Kimberly [name] E 04/18/2011

## 2011-04-18 NOTE — Progress Notes (Signed)
PT Cancellation Note  Treatment cancelled today due to patient's refusal to participate. Pt reports she would like to get washed up before participating with therapy. Will f/u as time allows.   [facility] HELEN 04/18/2011, 11:11 AM Pager: [phone]

## 2011-04-18 NOTE — Progress Notes (Signed)
Reg diet Patient examined and I agree with the assessment and plan  Violeta Gelinas, MD, MPH, FACS Pager: [phone]  04/18/2011 5:26 PM

## 2011-04-19 ENCOUNTER — Inpatient Hospital Stay (HOSPITAL_COMMUNITY): Payer: Medicaid Other

## 2011-04-19 LAB — BASIC METABOLIC PANEL
BUN: 4 mg/dL — ABNORMAL LOW (ref 6–23)
CO2: 28 mEq/L (ref 19–32)
Calcium: 8.2 mg/dL — ABNORMAL LOW (ref 8.4–10.5)
Chloride: 103 mEq/L (ref 96–112)
Creatinine, Ser: 0.65 mg/dL (ref 0.50–1.10)
GFR calc Af Amer: >90 mL/min (ref >90)
GFR calc non Af Amer: >90 mL/min (ref >90)
Glucose, Bld: 70 mg/dL (ref 70–99)
Potassium: 3.5 mEq/L (ref 3.5–5.1)
Sodium: 140 mEq/L (ref 135–145)

## 2011-04-19 MED ORDER — WHITE PETROLATUM GEL
Status: AC
  Filled 2011-04-19: qty 5

## 2011-04-19 MED ORDER — POLYETHYLENE GLYCOL 3350 17 G PO PACK
17.0000 g | PACK | Freq: Every day | ORAL | Status: DC
  Administered 2011-04-19: 17 g via ORAL
  Filled 2011-04-19 (×2): qty 1

## 2011-04-19 MED ORDER — BACITRACIN-NEOMYCIN-POLYMYXIN OINTMENT TUBE
TOPICAL_OINTMENT | Freq: Two times a day (BID) | CUTANEOUS | Status: DC
  Administered 2011-04-19: 1 via TOPICAL
  Administered 2011-04-20: 22:00:00 via TOPICAL
  Filled 2011-04-19: qty 15

## 2011-04-19 NOTE — Progress Notes (Signed)
Patient ID: Kimberly [name], female   DOB: 10-Jan-1950, 60 y.o.   MRN: [id number] 9 Days Post-Op  Subjective: Pt still c/o lot of abdominal pain.  Had a small BM last night, but not much.  Eating ok.  Patient says the toradol is really helping her pain.  Objective: Vital signs in last 24 hours: Temp:  [97.3 F (36.3 C)-98.2 F (36.8 C)] 97.4 F (36.3 C) (04/12 0518) Pulse Rate:  [66-74] 66  (04/12 0518) Resp:  [16-19] 16  (04/12 0518) BP: (109-148)/(49-54) 148/53 mmHg (04/12 0518) SpO2:  [97 %-98 %] 97 % (04/12 0518) Last BM Date: 04/18/11  Intake/Output from previous day: 04/11 0701 - 04/12 0700 In: 1755.9 [P.O.:480; I.V.:1275.9] Out: 1600 [Urine:1600] Intake/Output this shift:    PE: Abd: soft, still very tender, incision is c/d/i with staples.  +BS, ND  Lab Results:  No results found for this basename: WBC:2,HGB:2,HCT:2,PLT:2 in the last 72 hours BMET  [facility] 04/19/11 0555  NA 140  K 3.5  CL 103  CO2 28  GLUCOSE 70  BUN 4*  CREATININE 0.65  CALCIUM 8.2*   PT/INR No results found for this basename: LABPROT:2,INR:2 in the last 72 hours CMP     Component Value Date/Time   NA 140 04/19/2011 0555   K 3.5 04/19/2011 0555   CL 103 04/19/2011 0555   CO2 28 04/19/2011 0555   GLUCOSE 70 04/19/2011 0555   BUN 4* 04/19/2011 0555   CREATININE 0.65 04/19/2011 0555   CALCIUM 8.2* 04/19/2011 0555   PROT 4.7* 04/11/2011 0400   ALBUMIN 2.2* 04/11/2011 0400   AST 15 04/11/2011 0400   ALT 11 04/11/2011 0400   ALKPHOS 63 04/11/2011 0400   BILITOT 0.7 04/11/2011 0400   GFRNONAA >90 04/19/2011 0555   GFRAA >90 04/19/2011 0555   Lipase     Component Value Date/Time   LIPASE 45 04/08/2011 1343       Studies/Results: No results found.  Anti-infectives: Anti-infectives     Start     Dose/Rate Route Frequency Ordered Stop   04/10/11 2200   ciprofloxacin (CIPRO) IVPB 400 mg        400 mg 200 mL/hr over 60 Minutes Intravenous Every 12 hours 04/10/11 1722 04/10/11 2353   04/10/11 2200    metroNIDAZOLE (FLAGYL) IVPB 500 mg        500 mg 100 mL/hr over 60 Minutes Intravenous Every 8 hours 04/10/11 1722 04/10/11 2254   04/08/11 2200   ciprofloxacin (CIPRO) IVPB 400 mg  Status:  Discontinued        400 mg 200 mL/hr over 60 Minutes Intravenous Every 12 hours 04/08/11 2013 04/10/11 1722   04/08/11 2200   metroNIDAZOLE (FLAGYL) IVPB 500 mg  Status:  Discontinued        500 mg 100 mL/hr over 60 Minutes Intravenous Every 8 hours 04/08/11 2013 04/10/11 1722           Assessment/Plan  1. S/p ex lap with LOA  Plan: 1. Patient still c/o a lot of abdominal pain.  She is on tramadol and toradol for pain control.  She is eating well. Will add miralax to help with having a BM. 2. Patient is very slow to progress.  We do not see any evidence that she has any post-op complications causing her pain.  Do not see a need for a CT scan. 3. SLIV   LOS: 11 days    Cheyan Frees E 04/19/2011

## 2011-04-19 NOTE — Progress Notes (Signed)
Ate ice cream, had a BM.  Still worried she is not better Will check abdominal films.  Patient also req ointment for R nare - sore from NGT. Patient examined and I agree with the assessment and plan  Violeta Gelinas, MD, MPH, FACS Pager: [phone]  04/19/2011 6:03 PM

## 2011-04-20 NOTE — Progress Notes (Addendum)
Pt had three episodes of diarrhea and one episode of nausea at midnight. Zofran offered and pt refused. Suppository given at 1128 this am. Miralax given at 1526. No diarrhea reported prior to this episode. Pt very agitated and refusing nursing support and additional medications. Is consistently getting OOB without assistance or calling despite multiple nursing requests to call for help before getting up.  Will notify [facility] on call per pt request.  12:56 AM No new orders given per Dr Dwain Sarna of [facility]. Will continue to monitor pt.

## 2011-04-20 NOTE — Progress Notes (Signed)
10 Days Post-Op  Subjective: Patient is sleepy, but no real complaints Large amount of watery diarrhea last night.  Also had some nausea/ vomiting yesterday, but none today.  Objective: Vital signs in last 24 hours: Temp:  [97.9 F (36.6 C)] 97.9 F (36.6 C) 05/16/22 0610) Pulse Rate:  [71-78] 78  16-May-2022 0610) Resp:  [16-18] 16  2022/05/16 0610) BP: (129-147)/(54-66) 129/54 mmHg 2022-05-16 0610) SpO2:  [97 %-99 %] 99 % 2022/05/16 0610) Last BM Date: 05/16/2011  Intake/Output from previous day: 04/12 0701 - 05-16-2022 0700 In: 1100 [P.O.:600; I.V.:500] Out: 1000 [Urine:1000] Intake/Output this shift:    General appearance: alert, cooperative and poor dentition GI: soft, non-tender; bowel sounds normal; no masses,  no organomegaly Incision - staple line c/d/i  Lab Results:  No results found for this basename: WBC:2,HGB:2,HCT:2,PLT:2 in the last 72 hours BMET  [facility] 04/19/11 0555  NA 140  K 3.5  CL 103  CO2 28  GLUCOSE 70  BUN 4*  CREATININE 0.65  CALCIUM 8.2*   PT/INR No results found for this basename: LABPROT:2,INR:2 in the last 72 hours ABG No results found for this basename: PHART:2,PCO2:2,PO2:2,HCO3:2 in the last 72 hours  Studies/Results: Dg Abd 2 Views  05/16/2011  *RADIOLOGY REPORT*  Clinical Data: Follow-up small bowel obstruction.  Status post lysis of effusions.  Abdominal pain and weakness.  ABDOMEN - 2 VIEW  Comparison: 04/09/2011  Findings: Nasogastric tube is no longer seen, and midline abdominal skin staples noted.  Scattered air fluid levels are seen throughout small bowel and colon, which both demonstrate mild gaseous distention.  This is consistent with mild ileus.  No definite evidence of small bowel obstruction.  No free air identified.  IMPRESSION: Findings most consistent with mild postop ileus.  Original Report Authenticated By: Danae Orleans, M.D.    Anti-infectives: Anti-infectives     Start     Dose/Rate Route Frequency Ordered Stop   04/10/11 2200    ciprofloxacin (CIPRO) IVPB 400 mg        400 mg 200 mL/hr over 60 Minutes Intravenous Every 12 hours 04/10/11 1722 04/10/11 2353   04/10/11 2200   metroNIDAZOLE (FLAGYL) IVPB 500 mg        500 mg 100 mL/hr over 60 Minutes Intravenous Every 8 hours 04/10/11 1722 04/10/11 2254   04/08/11 2200   ciprofloxacin (CIPRO) IVPB 400 mg  Status:  Discontinued        400 mg 200 mL/hr over 60 Minutes Intravenous Every 12 hours 04/08/11 2013 04/10/11 1722   04/08/11 2200   metroNIDAZOLE (FLAGYL) IVPB 500 mg  Status:  Discontinued        500 mg 100 mL/hr over 60 Minutes Intravenous Every 8 hours 04/08/11 2013 04/10/11 1722          Assessment/Plan: s/p Procedure(s) (LRB): EXPLORATORY LAPAROTOMY (N/A) d/c Miralax; plan discharge tomorrow  LOS: 12 days    Kimberly [name] K. 2011-05-16

## 2011-04-21 MED ORDER — TRAMADOL HCL 50 MG PO TABS: 50.0000 mg | ORAL_TABLET | ORAL | Status: AC | PRN

## 2011-04-21 NOTE — Discharge Instructions (Signed)
[facility]      [facility], [place] [phone]  OPEN ABDOMINAL SURGERY: POST OP INSTRUCTIONS  Always review your discharge instruction sheet given to you by the facility where your surgery was performed.  IF YOU HAVE DISABILITY OR FAMILY LEAVE FORMS, YOU MUST BRING THEM TO THE OFFICE FOR PROCESSING.  PLEASE DO NOT GIVE THEM TO YOUR DOCTOR.  1. A prescription for pain medication may be given to you upon discharge.  Take your pain medication as prescribed, if needed.  If narcotic pain medicine is not needed, then you may take acetaminophen (Tylenol) or ibuprofen (Advil) as needed. 2. Take your usually prescribed medications unless otherwise directed. 3. If you need a refill on your pain medication, please contact your pharmacy. They will contact our office to request authorization.  Prescriptions will not be filled after 5pm or on week-ends. 4. You should follow a light diet the first few days after arrival home, such as soup and crackers, pudding, etc.unless your doctor has advised otherwise. A high-fiber, low fat diet can be resumed as tolerated.   Be sure to include lots of fluids daily. Most patients will experience some swelling and bruising on the chest and neck area.  Ice packs will help.  Swelling and bruising can take several days to resolve 5. Most patients will experience some swelling and bruising in the area of the incision. Ice pack will help. Swelling and bruising can take several days to resolve..  6. It is common to experience some constipation if taking pain medication after surgery.  Increasing fluid intake and taking a stool softener will usually help or prevent this problem from occurring.  A mild laxative (Milk of Magnesia or Miralax) should be taken according to package directions if there are no bowel movements after 48 hours. 7.  You may have steri-strips (small skin tapes) in place directly over the incision.  These strips should be left on the skin for 7-10 days.  ACTIVITIES:  You may resume regular (light) daily activities beginning the next day--such as daily self-care, walking, climbing stairs--gradually increasing activities as tolerated.  You may have sexual intercourse when it is comfortable.  Refrain from any heavy lifting or straining until approved by your doctor. a. You may drive when you no longer are taking prescription pain medication, you can comfortably wear a seatbelt, and you can safely maneuver your car and apply brakes b. Return to Work: ___________________________________ 8. You should see your doctor in the office for a follow-up appointment approximately two weeks after your surgery.  Make sure that you call for this appointment within a day or two after you arrive home to insure a convenient appointment time. OTHER INSTRUCTIONS:  _____________________________________________________________ _____________________________________________________________  WHEN TO CALL YOUR DOCTOR: 1. Fever over 101.0 2. Inability to urinate 3. Nausea and/or vomiting 4. Extreme swelling or bruising 5. Continued bleeding from incision. 6. Increased pain, redness, or drainage from the incision. 7. Difficulty swallowing or breathing 8. Muscle cramping or spasms. 9. Numbness or tingling in hands or feet or around lips.  The clinic staff is available to answer your questions during regular business hours.  Please don't hesitate to call and ask to speak to one of the nurses if you have concerns.  For further questions, please visit [email or web address]

## 2011-04-21 NOTE — Discharge Summary (Signed)
Physician Discharge Summary  Patient ID: MURNA BACKER MRN: [id number] DOB/AGE: 60-Nov-1952 60 y.o.  Admit date: 04/08/2011 Discharge date: 04/21/2011  Admission Diagnoses: Small bowel obstruction  Discharge Diagnoses:  Same   Active Problems:  Abdominal pain  Ileus  PVD (peripheral vascular disease)  Mesenteric artery stenosis   Discharged Condition: good  Hospital Course: Exploratory laparotomy for small bowel obstruction on 4/3.  Prolonged post-op ileus.  Patient mobilized slowly.  Finally, she began having some diarrhea on 4/12 and her diet was advanced.  Ready for discharge today.  Consults: None  Significant Diagnostic Studies: none  Treatments: surgery: as above  Discharge Exam: Blood pressure 127/58, pulse 54, temperature 98.7 F (37.1 C), temperature source Oral, resp. rate 17, height 5\' 4"  (1.626 m), weight 149 lb 11.1 oz (67.9 kg), SpO2 99.00%. General appearance: alert, cooperative and no distress GI: soft, non-tender; bowel sounds normal; no masses,  no organomegaly Incision c/d/i  Will remove staples  Disposition: 06-Home-Health Care Svc  Discharge Orders    Future Appointments: Provider: Department: Dept Phone: Center:   04/29/2011 10:30 AM [facility] [phone] [facility]   04/29/2011 11:45 AM Nada Libman, MD [facility] [phone] [facility]     Future Orders Please Complete By Expires   Diet general      Increase activity slowly      May walk up steps      May shower / Bathe      Driving Restrictions      Comments:   Do not drive while taking pain medications   Call MD for:  temperature >100.4      Call MD for:  persistant nausea and vomiting      Call MD for:  severe uncontrolled pain      Call MD for:  redness, tenderness, or signs of infection (pain, swelling, redness, odor or green/yellow discharge around incision site)        Medication List  As of 04/21/2011 10:49 AM   TAKE these medications         acetaminophen 500 MG  tablet   Commonly known as: TYLENOL   Take 500 mg by mouth every 6 (six) hours as needed.      aspirin EC 81 MG tablet   Take 81 mg by mouth daily.      fish oil-omega-3 fatty acids 1000 MG capsule   Take 2 g by mouth daily.      ibuprofen 200 MG tablet   Commonly known as: ADVIL,MOTRIN   Take 400-600 mg by mouth every 6 (six) hours as needed. For pain      loratadine 5 MG/5ML syrup   Commonly known as: CLARITIN   Take 10 mg by mouth daily as needed. For allergies      mulitivitamin with minerals Tabs   Take 1 tablet by mouth daily.      traMADol 50 MG tablet   Commonly known as: ULTRAM   Take 1 tablet (50 mg total) by mouth every 4 (four) hours as needed.           Follow-up Information    Follow up with [facility] H, MD. Schedule an appointment as soon as possible for a visit in 2 weeks.   Contact information:   [employer] [facility] [address] [place] [address] [phone]          Signed: Wynona Luna. 04/21/2011, 10:49 AM

## 2011-04-21 NOTE — Progress Notes (Signed)
11 Days Post-Op  Subjective: Patient is eager to go home today.  Diarrhea has slowed down.  Objective: Vital signs in last 24 hours: Temp:  [97.8 F (36.6 C)-98.7 F (37.1 C)] 98.7 F (37.1 C) (04/14 0607) Pulse Rate:  [54-83] 54  (04/14 0607) Resp:  [17-18] 17  (04/14 0607) BP: (126-133)/(58-71) 127/58 mmHg (04/14 0607) SpO2:  [98 %-100 %] 99 % (04/14 0607) Last BM Date: May 01, 2011  Intake/Output from previous day: 05-01-22 0701 - 04/14 0700 In: 960 [P.O.:960] Out: -  Intake/Output this shift:    General appearance: alert, cooperative and no distress Head: Normocephalic, without obvious abnormality, atraumatic, poor dentition GI: soft, non-tender; bowel sounds normal; no masses,  no organomegaly  Lab Results:  No results found for this basename: WBC:2,HGB:2,HCT:2,PLT:2 in the last 72 hours BMET  [facility] 04/19/11 0555  NA 140  K 3.5  CL 103  CO2 28  GLUCOSE 70  BUN 4*  CREATININE 0.65  CALCIUM 8.2*   PT/INR No results found for this basename: LABPROT:2,INR:2 in the last 72 hours ABG No results found for this basename: PHART:2,PCO2:2,PO2:2,HCO3:2 in the last 72 hours  Studies/Results: Dg Abd 2 Views  May 01, 2011  *RADIOLOGY REPORT*  Clinical Data: Follow-up small bowel obstruction.  Status post lysis of effusions.  Abdominal pain and weakness.  ABDOMEN - 2 VIEW  Comparison: 04/09/2011  Findings: Nasogastric tube is no longer seen, and midline abdominal skin staples noted.  Scattered air fluid levels are seen throughout small bowel and colon, which both demonstrate mild gaseous distention.  This is consistent with mild ileus.  No definite evidence of small bowel obstruction.  No free air identified.  IMPRESSION: Findings most consistent with mild postop ileus.  Original Report Authenticated By: Danae Orleans, M.D.    Anti-infectives: Anti-infectives     Start     Dose/Rate Route Frequency Ordered Stop   04/10/11 2200   ciprofloxacin (CIPRO) IVPB 400 mg        400  mg 200 mL/hr over 60 Minutes Intravenous Every 12 hours 04/10/11 1722 04/10/11 2353   04/10/11 2200   metroNIDAZOLE (FLAGYL) IVPB 500 mg        500 mg 100 mL/hr over 60 Minutes Intravenous Every 8 hours 04/10/11 1722 04/10/11 2254   04/08/11 2200   ciprofloxacin (CIPRO) IVPB 400 mg  Status:  Discontinued        400 mg 200 mL/hr over 60 Minutes Intravenous Every 12 hours 04/08/11 2013 04/10/11 1722   04/08/11 2200   metroNIDAZOLE (FLAGYL) IVPB 500 mg  Status:  Discontinued        500 mg 100 mL/hr over 60 Minutes Intravenous Every 8 hours 04/08/11 2013 04/10/11 1722          Assessment/Plan: s/p Procedure(s) (LRB): EXPLORATORY LAPAROTOMY (N/A) Discharge Patient does not want PO pain meds, but will give her a prescription for Ultram just in case Remove staples prior to discharge.  LOS: 13 days    Kimberly [name] K. 04/21/2011

## 2011-04-24 ENCOUNTER — Telehealth (INDEPENDENT_AMBULATORY_CARE_PROVIDER_SITE_OTHER): Payer: Self-pay | Admitting: Surgery

## 2011-04-24 NOTE — Telephone Encounter (Signed)
I called and gave the pt an appt for May 8th but she started crying and stated that she has custody of her grandsons and since she is out of work they have cut off their food stamps and day care.  I worked her in to our clinic on 4/25.

## 2011-04-26 ENCOUNTER — Encounter: Payer: Self-pay | Admitting: Surgery

## 2011-04-29 ENCOUNTER — Ambulatory Visit (INDEPENDENT_AMBULATORY_CARE_PROVIDER_SITE_OTHER): Payer: Medicaid Other | Admitting: Vascular Surgery

## 2011-04-29 ENCOUNTER — Telehealth: Payer: Self-pay | Admitting: Surgery

## 2011-04-29 ENCOUNTER — Encounter: Payer: Self-pay | Admitting: Surgery

## 2011-04-29 ENCOUNTER — Ambulatory Visit (INDEPENDENT_AMBULATORY_CARE_PROVIDER_SITE_OTHER): Payer: Medicaid Other | Admitting: Surgery

## 2011-04-29 VITALS — BP 148/59 | HR 64 | Temp 98.0°F | Resp 16 | Ht 64.0 in | Wt 124.7 lb

## 2011-04-29 DIAGNOSIS — I771 Stricture of artery: Secondary | ICD-10-CM

## 2011-04-29 DIAGNOSIS — I708 Atherosclerosis of other arteries: Secondary | ICD-10-CM

## 2011-04-29 DIAGNOSIS — I6529 Occlusion and stenosis of unspecified carotid artery: Secondary | ICD-10-CM

## 2011-04-29 NOTE — Progress Notes (Signed)
The patient is back today for followup. She underwent aortobifemoral bypass grafting in February. She was recently discharged having developed a small bowel obstruction which required exploratory laparotomy and lysis of adhesions. She had a very difficult hospital stay. She is recovering from that she still complains of some right lower quadrant tenderness.  The patient has known high-grade left carotid stenosis, greater than 80%. She also has significant proximal left subclavian disease.  Based on the patient's overall physical and mental condition I do not feel that proceeding with carotid and subclavian revascularization is appropriate at this time. Of idea for another month to see if these can prove. I'll entertain carotid endarterectomy and possibly carotid subclavian bypass grafting. Discuss this in one month

## 2011-04-29 NOTE — Progress Notes (Signed)
Carotid artery duplex performed @ [facility] 04/29/2011

## 2011-05-01 NOTE — Telephone Encounter (Signed)
Mrs. Kimberly [name] was advised to call DSS for assistance.

## 2011-05-02 ENCOUNTER — Ambulatory Visit (INDEPENDENT_AMBULATORY_CARE_PROVIDER_SITE_OTHER): Payer: Medicaid Other | Admitting: Surgery

## 2011-05-02 VITALS — BP 138/70 | HR 72 | Temp 98.5°F | Resp 18 | Ht 64.0 in | Wt 125.4 lb

## 2011-05-02 DIAGNOSIS — K565 Intestinal adhesions [bands], unspecified as to partial versus complete obstruction: Secondary | ICD-10-CM

## 2011-05-02 NOTE — Progress Notes (Signed)
[facility]  Ovidio Kin, MD,  FACS [address] [place]  [address] [place], [place]    [address] Phone:  [phone] FAX:  [phone]   Re:   Arvada Seaborn DOB:   [birth year] MRN:   [id number]  ASSESSMENT AND PLAN: 1. Enterolysis of adhesions for SBO - D. Kimberly [name] - 04/10/2011.  Patient discharged 04/21/2011 from [facility].  She is still complaining of a lot of abdominal pain.  But has no objective problems with her incision.  She is going to [employer] for disability.  We gave her copies of her D/C summary and op note.  Her return to me is prn.  2. Significant PVD (peripheral vascular disease) (04/08/2011)  S/p aortobifem - WMyra [name] - 2/19 2013.   Still needs work on her carotids and left subclavian artery. 3. Mesenteric artery stenosis  4. History of smoking - says she has quit. 5. Weight loss of 25 pounds since her first surgery.  HISTORY OF PRESENT ILLNESS: Chief Complaint  Patient presents with  . Follow-up   Kimberly [name] is a 61 y.o. (DOB: 06-30-1950)  white female who is a patient of VOLLMER, KELLY, MD, MD ([facility]) and comes to me today for follow up of an abdominal exploration for SBO on 04/10/2011.  The patient is exacerbated by all that has gone on in the last couple of months.  She has had two major abdominal operations in 6 weeks.  She is still in pain.  She is taking Tylenol and Ibuprofen for pain.  I warned her not to take more that 4,000 mg of Tylenol per day and no more than 1,600 mg of Ibuprofen per day.  She has a prescription for Tramadol that she is not using.  I suggested she may want to try to use that med for severe pain.  She has a daughter who is a drug addict that has been a problem for her for years.  She takes care of her daughter's two children (her grandchildren).  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  But she said she can not work at the time.  I would be willing to write a note for her to be out of work for 8  weeks.    She is going to [employer] for disability.  We gave her copies of her D/C summary and op note.  She will be in touch with [place] if she needs further information or help.  I encouraged to get re-established with her PCP to help her get through any disability process.  PHYSICAL EXAM: BP 138/70  Pulse 72  Temp(Src) 98.5 F (36.9 C) (Oral)  Resp 18  Ht 5\' 4"  (1.626 m)  Wt 125 lb 6 oz (56.87 kg)  BMI 21.52 kg/m2  General: WN older WF who is alert and generally healthy appearing.  HEENT: Normal. Pupils equal.  Neck: Supple. No mass.  No thyroid mass. Lymph Nodes:  No supraclavicular or cervical nodes. Lungs: Clear to auscultation and symmetric breath sounds. Heart:  RRR. No murmur or rub.  Abdomen: Soft. No mass. Her incision is well healed.  There is no infection.  She complains of "knots" in her abdomen, but I feel nothing unusual. Rectal: Not done. Extremities:  Good strength and ROM  in upper and lower extremities. Neurologic:  Grossly intact to motor and sensory function. Psychiatric: Depressed by all that has gone on.  DATA REVIEWED: Hospital records.  Ovidio Kin, MD, FACS Office:  [phone]  

## 2011-05-09 ENCOUNTER — Emergency Department (INDEPENDENT_AMBULATORY_CARE_PROVIDER_SITE_OTHER)
Admission: EM | Admit: 2011-05-09 | Discharge: 2011-05-09 | Disposition: A | Discharge status: Home / Self Care | Payer: Medicaid Other | Source: Home / Self Care | Attending: Family Medicine | Admitting: Family Medicine

## 2011-05-09 ENCOUNTER — Encounter (HOSPITAL_COMMUNITY): Payer: Self-pay | Admitting: *Deleted

## 2011-05-09 DIAGNOSIS — I889 Nonspecific lymphadenitis, unspecified: Secondary | ICD-10-CM

## 2011-05-09 MED ORDER — AMOXICILLIN 500 MG PO CAPS: 500.0000 mg | ORAL_CAPSULE | Freq: Three times a day (TID) | ORAL | Status: AC

## 2011-05-09 NOTE — ED Provider Notes (Signed)
History     CSN: [id number]  Arrival date & time 05/09/11  4098   First MD Initiated Contact with Patient 05/09/11 1938      Chief Complaint  Patient presents with  . Otalgia    (Consider location/radiation/quality/duration/timing/severity/associated sxs/prior treatment) Patient is a 61 y.o. female presenting with ear pain. The history is provided by the patient.  Otalgia The current episode started more than 2 days ago. There is pain in the right ear. The problem has been gradually worsening (feels like ear closing up , having whitish exudate., has had similar sx in left ear.). There has been no fever. The pain is mild. Associated symptoms include ear discharge and neck pain. Pertinent negatives include no sore throat.    Past Medical History  Diagnosis Date  . Uterine cancer   . Asthma   . Hyperlipidemia   . History of lead poisoning 1977  . Shortness of breath   . Peripheral vascular disease     claudication  right greater than left  femoral artery  . Anxiety   . Angina   . Blood transfusion   . Migraine   . Subclavian artery disease     left  . Small bowel obstruction, partial 04/08/11    Past Surgical History  Procedure Date  . Abdominal hysterectomy   . Abdominal angiogram   . Aorta - bilateral femoral artery bypass graft 02/26/2011    Procedure: AORTA BIFEMORAL BYPASS GRAFT;  Surgeon: Juleen China, MD;  Location: [facility] OR;  Service: Vascular;  Laterality: N/A;  . Pr vein bypass graft,aorto-fem-pop 02/26/11  . Laparotomy 04/10/2011    Procedure: EXPLORATORY LAPAROTOMY;  Surgeon: Kandis Cocking, MD;  Location: [facility] OR;  Service: General;  Laterality: N/A;  ENTEROLYSIS OF ADHESIONS    Family History  Problem Relation Age of Onset  . Cancer Mother     History  Substance Use Topics  . Smoking status: Former Smoker -- 0.5 packs/day for 45 years    Types: Cigarettes    Quit date: 02/24/2011  . Smokeless tobacco: Never Used  . Alcohol Use: No    OB History    Grav Para Term Preterm Abortions TAB SAB Ect Mult Living                  Review of Systems  Constitutional: Negative.   HENT: Positive for ear pain, neck pain and ear discharge. Negative for sore throat.   Respiratory: Negative for chest tightness.   Cardiovascular: Negative for chest pain.    Allergies  Codeine  Home Medications   Current Outpatient Rx  Name Route Sig Dispense Refill  . ULTRAM PO Oral Take by mouth.    . ACETAMINOPHEN 500 MG PO TABS Oral Take 500 mg by mouth every 6 (six) hours as needed.    . AMOXICILLIN 500 MG PO CAPS Oral Take 1 capsule (500 mg total) by mouth 3 (three) times daily. 30 capsule 0  . ASPIRIN EC 81 MG PO TBEC Oral Take 81 mg by mouth daily.    . IBUPROFEN 200 MG PO TABS Oral Take 400-600 mg by mouth every 6 (six) hours as needed. For pain    . ADULT MULTIVITAMIN W/MINERALS CH Oral Take 1 tablet by mouth daily.      BP 150/90  Pulse 70  Temp(Src) 98.6 F (37 C) (Oral)  Resp 16  SpO2 100%  Physical Exam  Nursing note and vitals reviewed. Constitutional: She is oriented to person, place, and time.  She appears well-developed and well-nourished.  HENT:  Head: Normocephalic.  Right Ear: Tympanic membrane, external ear and ear canal normal.  Left Ear: Tympanic membrane, external ear and ear canal normal.  Mouth/Throat: Oropharynx is clear and moist.    Neck: Normal range of motion. Neck supple. Carotid bruit is present.    Lymphadenopathy:    She has cervical adenopathy.  Neurological: She is alert and oriented to person, place, and time.  Skin: Skin is warm and dry.    ED Course  Procedures (including critical care time)  Labs Reviewed - No data to display No results found.   1. Cervical lymphadenitis       MDM          Linna Hoff, MD 05/09/11 2051

## 2011-05-09 NOTE — Discharge Instructions (Signed)
Use heat and advil and antibiotic as prescribed, see your doctor if further problems.

## 2011-05-09 NOTE — ED Notes (Signed)
Pt  Reports  Sensation  Of  R ear  Closing  And   r  Earache      As  Well as  Pain below  The  Eye as  Well    Pt  Reports  Had l  Ear  Infection in past    Pt  Appears in no  Acute  Distress  She  Is  Speaking in complete  sentances  And  Is  Awake  As  wel  As  Alert and  Oriented

## 2011-05-13 NOTE — Procedures (Unsigned)
CAROTID DUPLEX EXAM  INDICATION:  Carotid stenosis.  HISTORY: Diabetes:  No Cardiac:  No Hypertension:  No Smoking:  Previous Previous Surgery:  No parotid intervention CV History:  Left arm numbness Amaurosis Fugax No, Paresthesias Yes, Hemiparesis No                                      RIGHT             LEFT Brachial systolic pressure:         150               82 Brachial Doppler waveforms:         WNL               Biphasic Vertebral direction of flow:        Antegrade         Retrograde DUPLEX VELOCITIES (cm/sec) CCA peak systolic                   80                80 ECA peak systolic                   191               140 ICA peak systolic                   250               372 ICA end diastolic                   67                126 PLAQUE MORPHOLOGY:                  Heterogeneous     Heterogeneous PLAQUE AMOUNT:                      Moderate to severe                  Severe PLAQUE LOCATION:                    CCA/ICA/ECA       CCA/ICA  IMPRESSION: 1. Right internal carotid artery stenosis present in the 60% to 79%     range. 2. Right external carotid artery stenosis present. 3. Left internal carotid artery stenosis present in the 80% to 99%     range. 4. Left external carotid artery is patent. 5. Right vertebral artery is patent and antegrade. 6. Left vertebral artery is abnormal and retrograde. 7. Right subclavian artery is multiphasic with mildly elevated     velocity of 236 cm/sec. 8. Left subclavian is abnormal, monophasic, and suggestive of     significant disease process which may be more proximal than can be     visualized. 9. 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 10.Unchanged since previous study on 03/25/2011.  ___________________________________________ V. Charlena Cross, MD  SH/MEDQ   D:  04/29/2011  T:  04/29/2011  Job:  [id number]

## 2011-05-15 ENCOUNTER — Encounter (INDEPENDENT_AMBULATORY_CARE_PROVIDER_SITE_OTHER): Payer: Medicaid Other | Admitting: Surgery

## 2011-05-24 ENCOUNTER — Encounter: Payer: Self-pay | Admitting: Surgery

## 2011-05-27 ENCOUNTER — Encounter: Payer: Self-pay | Admitting: Surgery

## 2011-05-27 ENCOUNTER — Ambulatory Visit (INDEPENDENT_AMBULATORY_CARE_PROVIDER_SITE_OTHER): Payer: Medicaid Other | Admitting: Surgery

## 2011-05-27 VITALS — BP 90/54 | HR 67 | Resp 16 | Ht 64.0 in | Wt 133.2 lb

## 2011-05-27 DIAGNOSIS — I6529 Occlusion and stenosis of unspecified carotid artery: Secondary | ICD-10-CM

## 2011-05-27 DIAGNOSIS — I771 Stricture of artery: Secondary | ICD-10-CM | POA: Insufficient documentation

## 2011-05-27 NOTE — Progress Notes (Signed)
[facility] [place]   Patient name: Kimberly [name] MRN: [id number] DOB: 07/24/1950 Sex: female     Chief Complaint  Patient presents with  . Carotid    1 month f/u  . Sub-clavian    HISTORY OF PRESENT ILLNESS: The patient comes back today for followup. She is status post aortobifemoral bypass grafting in February for lifestyle limiting claudication. A month or so postoperatively she developed a small bowel obstruction which required exploratory laparotomy and lysis of adhesions. During this time she was noted to have a bilateral carotid stenosis, left greater than right with the left greater than 80%. We have been entertaining proceeding with left carotid endarterectomy but she has had multiple complaints following her surgery and has not been ready to proceed. Today she is back for further evaluation. She still complains of pain in her midline incision as well as decreased energy. She denies neurologic symptoms. Specifically she denies numbness or weakness in either extremity although she does have pain in her right shoulder down and her right arm. She denies amaurosis fugax. She denies slurred speech.  Past Medical History  Diagnosis Date  . Uterine cancer   . Asthma   . Hyperlipidemia   . History of lead poisoning 1977  . Shortness of breath   . Peripheral vascular disease     claudication  right greater than left  femoral artery  . Anxiety   . Angina   . Blood transfusion   . Migraine   . Subclavian artery disease     left  . Small bowel obstruction, partial 04/08/11    Past Surgical History  Procedure Date  . Abdominal hysterectomy   . Abdominal angiogram   . Aorta - bilateral femoral artery bypass graft 02/26/2011    Procedure: AORTA BIFEMORAL BYPASS GRAFT;  Surgeon: V Wells Chin Wachter, MD;  Location: [facility] OR;  Service: Vascular;  Laterality: N/A;  . Pr vein bypass graft,aorto-fem-pop 02/26/11  . Laparotomy 04/10/2011    Procedure: EXPLORATORY  LAPAROTOMY;  Surgeon: David H Newman, MD;  Location: [facility] OR;  Service: General;  Laterality: N/A;  ENTEROLYSIS OF ADHESIONS    History   Social History  . Marital Status: Legally Separated    Spouse Name: N/A    Number of Children: N/A  . Years of Education: N/A   Occupational History  . Not on file.   Social History Main Topics  . Smoking status: Former Smoker -- 0.5 packs/day for 45 years    Types: Cigarettes    Quit date: 02/24/2011  . Smokeless tobacco: Never Used  . Alcohol Use: No  . Drug Use: Yes     History of Cocaine and marijuana abuse: "not in many years" (04/08/11  . Sexually Active: Not Currently   Other Topics Concern  . Not on file   Social History Narrative  . No narrative on file    Family History  Problem Relation Age of Onset  . Cancer Mother     Allergies as of 05/27/2011 - Review Complete 05/27/2011  Allergen Reaction Noted  . Codeine Nausea Only 05/02/2011    Current Outpatient Prescriptions on File Prior to Visit  Medication Sig Dispense Refill  . acetaminophen (TYLENOL) 500 MG tablet Take 500 mg by mouth every 6 (six) hours as needed.      . aspirin EC 81 MG tablet Take 81 mg by mouth daily.      . ibuprofen (ADVIL,MOTRIN) 200 MG tablet Take 400-600 mg by mouth every   6 (six) hours as needed. For pain      . Multiple Vitamin (MULITIVITAMIN WITH MINERALS) TABS Take 1 tablet by mouth daily.      . TraMADol HCl (ULTRAM PO) Take by mouth.         REVIEW OF SYSTEMS: Please see history of present illness, otherwise all systems negative  PHYSICAL EXAMINATION:   Vital signs are BP 90/54  Pulse 67  Resp 16  Ht 5' 4" (1.626 m)  Wt 133 lb 3.2 oz (60.419 kg)  BMI 22.86 kg/m2  SpO2 99% General: The patient appears their stated age. HEENT:  No gross abnormalities Pulmonary:  Non labored breathing Abdomen: Soft and non-tender. No hernia is appreciated although she does have significant midline tenderness in the supra-umbilical region. Her abdomen  is nondistended Musculoskeletal: There are no major deformities. Neurologic: No focal weakness or paresthesias are detected, Skin: There are no ulcer or rashes noted. Psychiatric: The patient has normal affect. Cardiovascular: There is a regular rate and rhythm without significant murmur appreciated. Palpable femoral pulses   Diagnostic Studies I have again reviewed her carotid duplex which reveals greater than 80% left carotid stenosis with end-diastolic velocity of 126. She has 60-79% right carotid stenosis  Assessment: Carotid stenosis, asymptomatic, left greater than right  Plan: We discussed proceeding with left carotid endarterectomy. At this time she wishes to get this done. We discussed the risks and benefits of the operation including the risk of stroke, the risk of nerve injury, and cardiopulmonary complications, and the need for followup. Her operation is been scheduled for June 6   V. Wells Jessicalynn Deshong IV, M.D. [facility] [place] Office: [phone] Pager:  [phone]    

## 2011-05-31 ENCOUNTER — Encounter (HOSPITAL_COMMUNITY): Payer: Self-pay | Admitting: Pharmacy Technician

## 2011-06-04 ENCOUNTER — Other Ambulatory Visit: Payer: Self-pay | Admitting: Internal Medicine

## 2011-06-04 DIAGNOSIS — Z1231 Encounter for screening mammogram for malignant neoplasm of breast: Secondary | ICD-10-CM

## 2011-06-05 ENCOUNTER — Other Ambulatory Visit: Payer: Self-pay

## 2011-06-07 ENCOUNTER — Other Ambulatory Visit: Payer: Self-pay | Admitting: Internal Medicine

## 2011-06-07 DIAGNOSIS — M810 Age-related osteoporosis without current pathological fracture: Secondary | ICD-10-CM

## 2011-06-11 ENCOUNTER — Encounter (HOSPITAL_COMMUNITY): Payer: Self-pay

## 2011-06-11 ENCOUNTER — Encounter (HOSPITAL_COMMUNITY)
Admission: RE | Admit: 2011-06-11 | Discharge: 2011-06-11 | Disposition: A | Discharge status: Home / Self Care | Payer: Medicaid Other | Source: Ambulatory Visit | Attending: Surgery | Admitting: Surgery

## 2011-06-11 HISTORY — DX: Major depressive disorder, single episode, unspecified: F32.9

## 2011-06-11 HISTORY — DX: Depression, unspecified: F32.A

## 2011-06-11 LAB — COMPREHENSIVE METABOLIC PANEL
ALT: 25 U/L (ref 0–35)
AST: 15 U/L (ref 0–37)
Albumin: 3.6 g/dL (ref 3.5–5.2)
Alkaline Phosphatase: 71 U/L (ref 39–117)
BUN: 24 mg/dL — ABNORMAL HIGH (ref 6–23)
CO2: 30 mEq/L (ref 19–32)
Calcium: 9.3 mg/dL (ref 8.4–10.5)
Chloride: 104 mEq/L (ref 96–112)
Creatinine, Ser: 0.7 mg/dL (ref 0.50–1.10)
GFR calc Af Amer: >90 mL/min (ref >90)
GFR calc non Af Amer: >90 mL/min (ref >90)
Glucose, Bld: 94 mg/dL (ref 70–99)
Potassium: 3.5 mEq/L (ref 3.5–5.1)
Sodium: 143 mEq/L (ref 135–145)
Total Bilirubin: 0.3 mg/dL (ref 0.3–1.2)
Total Protein: 6.7 g/dL (ref 6.0–8.3)

## 2011-06-11 LAB — CBC
HCT: 37.4 % (ref 36.0–46.0)
Hemoglobin: 12.7 g/dL (ref 12.0–15.0)
MCH: 32.7 pg (ref 26.0–34.0)
MCHC: 34 g/dL (ref 30.0–36.0)
MCV: 96.4 fL (ref 78.0–100.0)
Platelets: 230 10*3/uL (ref 150–400)
RBC: 3.88 MIL/uL (ref 3.87–5.11)
RDW: 13.7 % (ref 11.5–15.5)
WBC: 8.7 10*3/uL (ref 4.0–10.5)

## 2011-06-11 LAB — URINALYSIS, ROUTINE W REFLEX MICROSCOPIC
Bilirubin Urine: NEGATIVE
Glucose, UA: NEGATIVE mg/dL
Ketones, ur: NEGATIVE mg/dL
Leukocytes, UA: NEGATIVE
Nitrite: NEGATIVE
Protein, ur: NEGATIVE mg/dL
Specific Gravity, Urine: 1.026 (ref 1.005–1.030)
Urobilinogen, UA: 0.2 mg/dL (ref 0.0–1.0)
pH: 5 (ref 5.0–8.0)

## 2011-06-11 LAB — PROTIME-INR
INR: 0.98 (ref 0.00–1.49)
Prothrombin Time: 13.2 seconds (ref 11.6–15.2)

## 2011-06-11 LAB — URINE MICROSCOPIC-ADD ON

## 2011-06-11 LAB — TYPE AND SCREEN
ABO/RH(D): O POS
Antibody Screen: NEGATIVE

## 2011-06-11 LAB — APTT: aPTT: 31 seconds (ref 24–37)

## 2011-06-11 LAB — SURGICAL PCR SCREEN
MRSA, PCR: NEGATIVE
Staphylococcus aureus: POSITIVE — AB

## 2011-06-11 NOTE — Pre-Procedure Instructions (Addendum)
20 Kimberly [name]  06/11/2011   Your procedure is scheduled on: Thursday, June 6th.  Report to [facility] at 5:30 AM.  Call this number if you have problems the morning of surgery: [phone]   Remember:   Do not eat food:After Midnight.  May have clear liquids: up to 4 Hours before arrival. (1:30am)  Clear liquids include soda, tea, black coffee, apple or grape juice, broth.   Take these medicines the morning of surgery with A SIP OF WATER: Acetaminophen (Tylenol).   Do not wear jewelry, make-up or nail polish.  Do not wear lotions, powders, or perfumes. You may wear deodorant.  Do not shave 48 hours prior to surgery. Men may shave face and neck.  Do not bring valuables to the hospital.  Contacts, dentures or bridgework may not be worn into surgery.  Leave suitcase in the car. After surgery it may be brought to your room.  For patients admitted to the hospital, checkout time is 11:00 AM the day of discharge.   Patients discharged the day of surgery will not be allowed to drive home.  Name and phone number of your driver: NA  Special Instructions: CHG Shower Use Special Wash: 1/2 bottle night before surgery and 1/2 bottle morning of surgery.   Please read over the following fact sheets that you were given: Pain Booklet, Coughing and Deep Breathing, MRSA Information and Surgical Site Infection Prevention and Blood Transfusion.

## 2011-06-12 MED ORDER — DEXTROSE 5 % IV SOLN
1.5000 g | INTRAVENOUS | Status: AC
  Administered 2011-06-13: 1.5 g via INTRAVENOUS
  Filled 2011-06-12: qty 1.5

## 2011-06-13 ENCOUNTER — Encounter (HOSPITAL_COMMUNITY): Payer: Self-pay | Admitting: Certified Registered"

## 2011-06-13 ENCOUNTER — Ambulatory Visit (HOSPITAL_COMMUNITY): Payer: Medicaid Other | Admitting: Certified Registered"

## 2011-06-13 ENCOUNTER — Inpatient Hospital Stay (HOSPITAL_COMMUNITY)
Admission: RE | Admit: 2011-06-13 | Discharge: 2011-06-14 | DRG: 039 | Disposition: A | Discharge status: Home / Self Care | Payer: Medicaid Other | Source: Ambulatory Visit | Attending: Surgery | Admitting: Surgery

## 2011-06-13 ENCOUNTER — Encounter (HOSPITAL_COMMUNITY)
Admission: RE | Disposition: A | Discharge status: Home / Self Care | Payer: Self-pay | Source: Ambulatory Visit | Attending: Surgery

## 2011-06-13 DIAGNOSIS — I6529 Occlusion and stenosis of unspecified carotid artery: Secondary | ICD-10-CM

## 2011-06-13 DIAGNOSIS — E785 Hyperlipidemia, unspecified: Secondary | ICD-10-CM | POA: Diagnosis present

## 2011-06-13 DIAGNOSIS — J45909 Unspecified asthma, uncomplicated: Secondary | ICD-10-CM | POA: Diagnosis present

## 2011-06-13 DIAGNOSIS — F411 Generalized anxiety disorder: Secondary | ICD-10-CM | POA: Diagnosis present

## 2011-06-13 DIAGNOSIS — F3289 Other specified depressive episodes: Secondary | ICD-10-CM | POA: Diagnosis present

## 2011-06-13 DIAGNOSIS — F329 Major depressive disorder, single episode, unspecified: Secondary | ICD-10-CM | POA: Diagnosis present

## 2011-06-13 DIAGNOSIS — Z87891 Personal history of nicotine dependence: Secondary | ICD-10-CM

## 2011-06-13 HISTORY — PX: ENDARTERECTOMY: SHX5162

## 2011-06-13 HISTORY — PX: CAROTID ENDARTERECTOMY: SUR193

## 2011-06-13 SURGERY — ENDARTERECTOMY, CAROTID
Anesthesia: General | Site: Neck | Laterality: Left

## 2011-06-13 MED ORDER — MAGNESIUM SULFATE 40 MG/ML IJ SOLN
2.0000 g | Freq: Once | INTRAMUSCULAR | Status: AC | PRN
  Filled 2011-06-13: qty 50

## 2011-06-13 MED ORDER — FENTANYL CITRATE 0.05 MG/ML IJ SOLN
INTRAMUSCULAR | Status: DC | PRN
  Administered 2011-06-13: 150 ug via INTRAVENOUS
  Administered 2011-06-13 (×2): 50 ug via INTRAVENOUS

## 2011-06-13 MED ORDER — MORPHINE SULFATE 2 MG/ML IJ SOLN
2.0000 mg | INTRAMUSCULAR | Status: DC | PRN
  Administered 2011-06-13 (×3): 2 mg via INTRAVENOUS
  Filled 2011-06-13 (×3): qty 1

## 2011-06-13 MED ORDER — HEMOSTATIC AGENTS (NO CHARGE) OPTIME
TOPICAL | Status: DC | PRN
  Administered 2011-06-13: 1 via TOPICAL

## 2011-06-13 MED ORDER — ASPIRIN EC 81 MG PO TBEC
81.0000 mg | DELAYED_RELEASE_TABLET | Freq: Every day | ORAL | Status: DC
  Administered 2011-06-13 – 2011-06-14 (×2): 81 mg via ORAL
  Filled 2011-06-13 (×2): qty 1

## 2011-06-13 MED ORDER — PROTAMINE SULFATE 10 MG/ML IV SOLN
INTRAVENOUS | Status: DC | PRN
  Administered 2011-06-13 (×4): 10 mg via INTRAVENOUS

## 2011-06-13 MED ORDER — MUPIROCIN 2 % EX OINT
TOPICAL_OINTMENT | Freq: Two times a day (BID) | CUTANEOUS | Status: DC
  Administered 2011-06-13 – 2011-06-14 (×2): via NASAL

## 2011-06-13 MED ORDER — DEXAMETHASONE SODIUM PHOSPHATE 4 MG/ML IJ SOLN
INTRAMUSCULAR | Status: DC | PRN
  Administered 2011-06-13: 4 mg via INTRAVENOUS

## 2011-06-13 MED ORDER — PHENYLEPHRINE HCL 10 MG/ML IJ SOLN
10.0000 mg | INTRAVENOUS | Status: DC | PRN
  Administered 2011-06-13: 25 ug/min via INTRAVENOUS

## 2011-06-13 MED ORDER — PHENOL 1.4 % MT LIQD
1.0000 | OROMUCOSAL | Status: DC | PRN
  Filled 2011-06-13: qty 177

## 2011-06-13 MED ORDER — ARTIFICIAL TEARS OP OINT
TOPICAL_OINTMENT | OPHTHALMIC | Status: DC | PRN
  Administered 2011-06-13: 1 via OPHTHALMIC

## 2011-06-13 MED ORDER — MIDAZOLAM HCL 5 MG/5ML IJ SOLN
INTRAMUSCULAR | Status: DC | PRN
  Administered 2011-06-13: 1 mg via INTRAVENOUS

## 2011-06-13 MED ORDER — ACETAMINOPHEN 325 MG PO TABS: 325.0000 mg | ORAL_TABLET | ORAL | Status: DC | PRN

## 2011-06-13 MED ORDER — SODIUM CHLORIDE 0.9 % IR SOLN
Status: DC | PRN
  Administered 2011-06-13: 09:00:00

## 2011-06-13 MED ORDER — HEPARIN SODIUM (PORCINE) 1000 UNIT/ML IJ SOLN
INTRAMUSCULAR | Status: DC | PRN
  Administered 2011-06-13: 6 mL via INTRAVENOUS

## 2011-06-13 MED ORDER — DOCUSATE SODIUM 100 MG PO CAPS
100.0000 mg | ORAL_CAPSULE | Freq: Every day | ORAL | Status: DC
  Administered 2011-06-14: 100 mg via ORAL
  Filled 2011-06-13: qty 1

## 2011-06-13 MED ORDER — SODIUM CHLORIDE 0.9 % IV SOLN
500.0000 mL | Freq: Once | INTRAVENOUS | Status: AC | PRN
  Administered 2011-06-13: 500 mL via INTRAVENOUS

## 2011-06-13 MED ORDER — SODIUM CHLORIDE 0.9 % IV SOLN
INTRAVENOUS | Status: DC
  Administered 2011-06-13: 14:00:00 via INTRAVENOUS

## 2011-06-13 MED ORDER — HYDROMORPHONE HCL PF 1 MG/ML IJ SOLN
INTRAMUSCULAR | Status: AC
  Filled 2011-06-13: qty 1

## 2011-06-13 MED ORDER — EPHEDRINE SULFATE 50 MG/ML IJ SOLN
INTRAMUSCULAR | Status: DC | PRN
  Administered 2011-06-13: 5 mg via INTRAVENOUS

## 2011-06-13 MED ORDER — OXYCODONE HCL 5 MG PO TABS: 5.0000 mg | ORAL_TABLET | Freq: Four times a day (QID) | ORAL | Status: DC | PRN

## 2011-06-13 MED ORDER — ONDANSETRON HCL 4 MG/2ML IJ SOLN: 4.0000 mg | Freq: Once | INTRAMUSCULAR | Status: DC | PRN

## 2011-06-13 MED ORDER — LABETALOL HCL 5 MG/ML IV SOLN: 10.0000 mg | INTRAVENOUS | Status: DC | PRN

## 2011-06-13 MED ORDER — ONDANSETRON HCL 4 MG/2ML IJ SOLN
INTRAMUSCULAR | Status: DC | PRN
  Administered 2011-06-13: 4 mg via INTRAVENOUS

## 2011-06-13 MED ORDER — ADULT MULTIVITAMIN W/MINERALS CH
1.0000 | ORAL_TABLET | Freq: Every day | ORAL | Status: DC
  Administered 2011-06-13 – 2011-06-14 (×2): 1 via ORAL
  Filled 2011-06-13 (×2): qty 1

## 2011-06-13 MED ORDER — DEXTROSE 5 % IV SOLN
1.5000 g | Freq: Two times a day (BID) | INTRAVENOUS | Status: AC
  Administered 2011-06-13 – 2011-06-14 (×2): 1.5 g via INTRAVENOUS
  Filled 2011-06-13 (×2): qty 1.5

## 2011-06-13 MED ORDER — LACTATED RINGERS IV SOLN
INTRAVENOUS | Status: DC | PRN
  Administered 2011-06-13 (×2): via INTRAVENOUS

## 2011-06-13 MED ORDER — LIDOCAINE HCL 4 % MT SOLN
OROMUCOSAL | Status: DC | PRN
  Administered 2011-06-13: 4 mL via TOPICAL

## 2011-06-13 MED ORDER — PANTOPRAZOLE SODIUM 40 MG PO TBEC
40.0000 mg | DELAYED_RELEASE_TABLET | Freq: Every day | ORAL | Status: DC
  Administered 2011-06-13: 40 mg via ORAL
  Filled 2011-06-13: qty 1

## 2011-06-13 MED ORDER — HYDROMORPHONE HCL PF 1 MG/ML IJ SOLN
0.2500 mg | INTRAMUSCULAR | Status: DC | PRN
  Administered 2011-06-13 (×2): 0.5 mg via INTRAVENOUS

## 2011-06-13 MED ORDER — HYDRALAZINE HCL 20 MG/ML IJ SOLN: 10.0000 mg | INTRAMUSCULAR | Status: DC | PRN

## 2011-06-13 MED ORDER — ASPIRIN EC 325 MG PO TBEC: 325.0000 mg | DELAYED_RELEASE_TABLET | Freq: Every day | ORAL | Status: DC

## 2011-06-13 MED ORDER — MUPIROCIN 2 % EX OINT
TOPICAL_OINTMENT | CUTANEOUS | Status: AC
  Administered 2011-06-13: 1 via NASAL
  Filled 2011-06-13: qty 22

## 2011-06-13 MED ORDER — SENNOSIDES-DOCUSATE SODIUM 8.6-50 MG PO TABS
1.0000 | ORAL_TABLET | Freq: Every evening | ORAL | Status: DC | PRN
  Filled 2011-06-13: qty 1

## 2011-06-13 MED ORDER — ROCURONIUM BROMIDE 100 MG/10ML IV SOLN
INTRAVENOUS | Status: DC | PRN
  Administered 2011-06-13: 10 mg via INTRAVENOUS
  Administered 2011-06-13: 50 mg via INTRAVENOUS

## 2011-06-13 MED ORDER — AMOXICILLIN 500 MG PO CAPS
500.0000 mg | ORAL_CAPSULE | Freq: Three times a day (TID) | ORAL | Status: DC
  Administered 2011-06-13 – 2011-06-14 (×3): 500 mg via ORAL
  Filled 2011-06-13 (×6): qty 1

## 2011-06-13 MED ORDER — PROPOFOL 10 MG/ML IV EMUL
INTRAVENOUS | Status: DC | PRN
  Administered 2011-06-13: 140 mg via INTRAVENOUS

## 2011-06-13 MED ORDER — ALUM & MAG HYDROXIDE-SIMETH 200-200-20 MG/5ML PO SUSP: 15.0000 mL | ORAL | Status: DC | PRN

## 2011-06-13 MED ORDER — ONDANSETRON HCL 4 MG/2ML IJ SOLN: 4.0000 mg | Freq: Four times a day (QID) | INTRAMUSCULAR | Status: DC | PRN

## 2011-06-13 MED ORDER — METOPROLOL TARTRATE 1 MG/ML IV SOLN: 2.0000 mg | INTRAVENOUS | Status: DC | PRN

## 2011-06-13 MED ORDER — SODIUM CHLORIDE 0.9 % IV SOLN: INTRAVENOUS | Status: DC

## 2011-06-13 MED ORDER — LIDOCAINE HCL (CARDIAC) 20 MG/ML IV SOLN
INTRAVENOUS | Status: DC | PRN
  Administered 2011-06-13: 40 mg via INTRAVENOUS

## 2011-06-13 MED ORDER — DOPAMINE-DEXTROSE 3.2-5 MG/ML-% IV SOLN: 3.0000 ug/kg/min | INTRAVENOUS | Status: DC

## 2011-06-13 MED ORDER — NEOSTIGMINE METHYLSULFATE 1 MG/ML IJ SOLN
INTRAMUSCULAR | Status: DC | PRN
  Administered 2011-06-13: 4 mg via INTRAVENOUS

## 2011-06-13 MED ORDER — POTASSIUM CHLORIDE CRYS ER 20 MEQ PO TBCR: 20.0000 meq | EXTENDED_RELEASE_TABLET | Freq: Once | ORAL | Status: AC | PRN

## 2011-06-13 MED ORDER — POLYETHYLENE GLYCOL 3350 17 G PO PACK
17.0000 g | PACK | ORAL | Status: DC | PRN
  Filled 2011-06-13: qty 1

## 2011-06-13 MED ORDER — ACETAMINOPHEN 650 MG RE SUPP: 325.0000 mg | RECTAL | Status: DC | PRN

## 2011-06-13 MED ORDER — OXYCODONE HCL 5 MG PO TABS: 5.0000 mg | ORAL_TABLET | ORAL | Status: DC | PRN

## 2011-06-13 MED ORDER — 0.9 % SODIUM CHLORIDE (POUR BTL) OPTIME
TOPICAL | Status: DC | PRN
  Administered 2011-06-13: 1000 mL

## 2011-06-13 MED ORDER — GUAIFENESIN-DM 100-10 MG/5ML PO SYRP: 15.0000 mL | ORAL_SOLUTION | ORAL | Status: DC | PRN

## 2011-06-13 MED ORDER — GLYCOPYRROLATE 0.2 MG/ML IJ SOLN
INTRAMUSCULAR | Status: DC | PRN
  Administered 2011-06-13 (×2): 0.1 mg via INTRAVENOUS
  Administered 2011-06-13: 0.6 mg via INTRAVENOUS

## 2011-06-13 SURGICAL SUPPLY — 54 items
ADH SKN CLS APL DERMABOND .7 (GAUZE/BANDAGES/DRESSINGS) ×1
CANISTER SUCTION 2500CC (MISCELLANEOUS) ×2 IMPLANT
CATH ROBINSON RED A/P 18FR (CATHETERS) ×2 IMPLANT
CATH SUCT 10FR WHISTLE TIP (CATHETERS) ×2 IMPLANT
CLIP TI MEDIUM 24 (CLIP) ×2 IMPLANT
CLIP TI WIDE RED SMALL 24 (CLIP) ×2 IMPLANT
CLOTH BEACON ORANGE TIMEOUT ST (SAFETY) ×2 IMPLANT
COVER SURGICAL LIGHT HANDLE (MISCELLANEOUS) ×4 IMPLANT
CRADLE DONUT ADULT HEAD (MISCELLANEOUS) ×2 IMPLANT
DERMABOND ADVANCED (GAUZE/BANDAGES/DRESSINGS) ×1
DERMABOND ADVANCED .7 DNX12 (GAUZE/BANDAGES/DRESSINGS) ×1 IMPLANT
DRAIN CHANNEL 15F RND FF W/TCR (WOUND CARE) IMPLANT
DRAPE WARM FLUID 44X44 (DRAPE) ×2 IMPLANT
ELECT REM PT RETURN 9FT ADLT (ELECTROSURGICAL) ×2
ELECTRODE REM PT RTRN 9FT ADLT (ELECTROSURGICAL) ×1 IMPLANT
EVACUATOR SILICONE 100CC (DRAIN) IMPLANT
GLOVE BIOGEL PI IND STRL 6.5 (GLOVE) IMPLANT
GLOVE BIOGEL PI IND STRL 7.0 (GLOVE) IMPLANT
GLOVE BIOGEL PI IND STRL 7.5 (GLOVE) ×1 IMPLANT
GLOVE BIOGEL PI INDICATOR 6.5 (GLOVE) ×2
GLOVE BIOGEL PI INDICATOR 7.0 (GLOVE) ×2
GLOVE BIOGEL PI INDICATOR 7.5 (GLOVE) ×2
GLOVE ECLIPSE 6.5 STRL STRAW (GLOVE) ×3 IMPLANT
GLOVE SS BIOGEL STRL SZ 7 (GLOVE) IMPLANT
GLOVE SUPERSENSE BIOGEL SZ 7 (GLOVE) ×1
GLOVE SURG SS PI 7.5 STRL IVOR (GLOVE) ×2 IMPLANT
GOWN PREVENTION PLUS XXLARGE (GOWN DISPOSABLE) ×4 IMPLANT
GOWN STRL NON-REIN LRG LVL3 (GOWN DISPOSABLE) ×4 IMPLANT
HEMOSTAT SNOW SURGICEL 2X4 (HEMOSTASIS) ×1 IMPLANT
HEMOSTAT SURGICEL 2X14 (HEMOSTASIS) IMPLANT
INSERT FOGARTY SM (MISCELLANEOUS) IMPLANT
KIT BASIN OR (CUSTOM PROCEDURE TRAY) ×2 IMPLANT
KIT ROOM TURNOVER OR (KITS) ×2 IMPLANT
NS IRRIG 1000ML POUR BTL (IV SOLUTION) ×4 IMPLANT
PACK CAROTID (CUSTOM PROCEDURE TRAY) ×2 IMPLANT
PAD ARMBOARD 7.5X6 YLW CONV (MISCELLANEOUS) ×4 IMPLANT
PATCH VASCULAR VASCU GUARD 1X6 (Vascular Products) ×1 IMPLANT
SHUNT CAROTID BYPASS 10 (VASCULAR PRODUCTS) IMPLANT
SHUNT CAROTID BYPASS 12FRX15.5 (VASCULAR PRODUCTS) IMPLANT
SPECIMEN JAR SMALL (MISCELLANEOUS) ×2 IMPLANT
SUT ETHILON 3 0 PS 1 (SUTURE) IMPLANT
SUT PROLENE 6 0 BV (SUTURE) ×3 IMPLANT
SUT PROLENE 7 0 BV 1 (SUTURE) IMPLANT
SUT PROLENE 7 0 BV1 MDA (SUTURE) ×1 IMPLANT
SUT SILK 3 0 TIES 17X18 (SUTURE) ×2
SUT SILK 3-0 18XBRD TIE BLK (SUTURE) IMPLANT
SUT VIC AB 3-0 SH 27 (SUTURE) ×4
SUT VIC AB 3-0 SH 27X BRD (SUTURE) ×2 IMPLANT
SUT VICRYL 4-0 PS2 18IN ABS (SUTURE) ×2 IMPLANT
TOWEL OR 17X24 6PK STRL BLUE (TOWEL DISPOSABLE) ×2 IMPLANT
TOWEL OR 17X26 10 PK STRL BLUE (TOWEL DISPOSABLE) ×2 IMPLANT
TRAY FOLEY CATH 14FRSI W/METER (CATHETERS) ×2 IMPLANT
TUBE CONNECTING 12X1/4 (SUCTIONS) ×1 IMPLANT
WATER STERILE IRR 1000ML POUR (IV SOLUTION) ×2 IMPLANT

## 2011-06-13 NOTE — Progress Notes (Signed)
Utilization review completed.  

## 2011-06-13 NOTE — Anesthesia Postprocedure Evaluation (Signed)
  Anesthesia Post-op Note  Patient: Kimberly [name]  Procedure(s) Performed: Procedure(s) (LRB): ENDARTERECTOMY CAROTID (Left)  Patient Location: PACU  Anesthesia Type: General  Level of Consciousness: awake, alert  and oriented  Airway and Oxygen Therapy: Patient Spontanous Breathing  Post-op Pain: mild  Post-op Assessment: Post-op Vital signs reviewed and Patient's Cardiovascular Status Stable  Post-op Vital Signs: stable  Complications: No apparent anesthesia complications

## 2011-06-13 NOTE — Op Note (Signed)
[facility] [place]  Patient name: Kimberly [name] MRN: [id number] DOB: 08/07/1950 Sex: female  06/13/2011 Pre-operative Diagnosis: Asymptomatic  left carotid stenosis Post-operative diagnosis:  Same Surgeon:  Jorge Ny Assistants:  Narda Amber. P.A. Procedure:    left carotid Endarterectomy with bovine pericardial patch angioplasty Anesthesia:  General Blood Loss:  See anesthesia record Specimens:  Carotid Plaque to pathology  Findings:  80 %stenosis; Thrombus:  none  Indications:  During evaluation for aorto-iliac disease, the patient was found to have bilateral carotid stenosis which was asymptomatic.  Her left side was > 80%.  She is here for repair  Procedure:  The patient was identified in the holding area and taken to [facility] OR ROOM 09  The patient was then placed supine on the table.   General endotrachial anesthesia was administered.  The patient was prepped and draped in the usual sterile fashion.  A time out was called and antibiotics were administered.  The incision was made along the anterior border of the left sternocleidomastoid muscle.  Cautery was used to dissect through the subcutaneous tissue.  The platysma muscle was divided with cautery.  The internal jugular vein was exposed along its anterior medial border.  The common facial vein was exposed and then divided between 2-0 silk ties and metal clips.  The common carotid artery was then circumferentially exposed and encircled with an umbilical tape.  The vagus nerve was identified and protected.  Next sharp dissection was used to expose the external carotid artery and the superior thyroid artery.  The were encircled with a blue vessel loop and a 2-0 silk tie respectively.  Finally, the internal carotid was carefully dissected free.  An umbilical tape was placed around the internal carotid artery distal to the diseased segment.  The hypoglossal nerve was visualized throughout and protected.  The patient  was given systemic heparinization.  A bovine carotid patch was selected and prepared on the back table.  A 10 french shunt was also prepared.  After blood pressure readings were appropriate and the heparin had been given time to circulate, the internal carotid artery was occluded with a baby Gregory clamp.  The external and common carotid arteries were then occluded with vascular clamps and the 2-0 tie tightened on the superior thyroid artery.  A #11 blade was used to make an arteriotomy in the common carotid artery.  This was extended with Potts scissors along the anterior and lateral border of the common and internal carotid artery.  Approximately 80% stenosis was identified.  There was no thrombus identified.  I elected not to place a shunt due to her pulsatile back-bleeding.   A kleiner kuntz elevator was used to perform endarterectomy.  An eversion endarterectomy was performed in the external carotid artery.  A good distal endpoint was obtained in the internal carotid artery.  The specimen was removed and sent to pathology.  Heparinized saline was used to irrigate the endarterectomized field.  All potential embolic debris was removed.  Bovine pericardial patch angioplasty was then performed using a running 6-0 Prolene. The common internal and external carotid arteries were all appropriately flushed. The artery was again irrigated with heparin saline.  The anastomosis was then secured. The clamp was first released on the external carotid artery followed by the common carotid artery approximately 30 seconds later, bloodflow was reestablish through the internal carotid artery.  Next, a hand-held  Doppler was used to evaluate the signals in the common, external, and internal  carotid arteries, all of which had appropriate signals. I then administered  50 mg protamine. The wound was then irrigated.  After hemostasis was achieved, the carotid sheath was reapproximated with 3-0 Vicryl. The  platysma muscle was  reapproximated with running 3-0 Vicryl. The skin  was closed with 4-0 Vicryl. Dermabond was placed on the skin. The  patient was then successfully extubated. His neurologic exam was  similar to his preprocedural exam. He was then taken to recovery room  in stable condition. There were no complications.     Disposition:  To PACU in stable condition.  Relevant Operative Details:  No shunt used.  Normal anatomy and location of hypoglossl nerve  V. Durene Cal, M.D. [facility] [place] Office: [phone] Pager:  [phone]

## 2011-06-13 NOTE — Anesthesia Procedure Notes (Signed)
Procedure Name: Intubation Date/Time: 06/13/2011 7:39 AM Performed by: Elon Alas Pre-anesthesia Checklist: Patient identified, Timeout performed, Emergency Drugs available, Suction available and Patient being monitored Patient Re-evaluated:Patient Re-evaluated prior to inductionOxygen Delivery Method: Circle system utilized Preoxygenation: Pre-oxygenation with 100% oxygen Intubation Type: IV induction and Cricoid Pressure applied Ventilation: Mask ventilation without difficulty Laryngoscope Size: Mac and 3 Grade View: Grade II Tube type: Oral Tube size: 7.5 mm Number of attempts: 1 Airway Equipment and Method: Stylet Placement Confirmation: ETT inserted through vocal cords under direct vision,  positive ETCO2 and breath sounds checked- equal and bilateral Secured at: 21 cm Tube secured with: Tape Dental Injury: Teeth and Oropharynx as per pre-operative assessment

## 2011-06-13 NOTE — Anesthesia Preprocedure Evaluation (Addendum)
Anesthesia Evaluation  Patient identified by MRN, date of birth, ID band Patient awake    Reviewed: Allergy & Precautions, H&P , NPO status , Patient's Chart, lab work & pertinent test results  Airway Mallampati: III TM Distance: >3 FB Neck ROM: Full    Dental  (+) Dental Advisory Given and Poor Dentition,    Pulmonary asthma , Current Smoker,  breath sounds clear to auscultation        Cardiovascular + Peripheral Vascular Disease Rhythm:Regular Rate:Normal  Hx aortabifem surgery in February, elevated cholesterol, left carotid stenosis   Neuro/Psych PSYCHIATRIC DISORDERS Anxiety Depression    GI/Hepatic   Endo/Other    Renal/GU      Musculoskeletal   Abdominal   Peds  Hematology   Anesthesia Other Findings   Reproductive/Obstetrics                       Anesthesia Physical Anesthesia Plan  ASA: III  Anesthesia Plan: General   Post-op Pain Management:    Induction: Intravenous  Airway Management Planned: Oral ETT  Additional Equipment: Arterial line  Intra-op Plan:   Post-operative Plan: Extubation in OR  Informed Consent: I have reviewed the patients History and Physical, chart, labs and discussed the procedure including the risks, benefits and alternatives for the proposed anesthesia with the patient or authorized representative who has indicated his/her understanding and acceptance.   Dental advisory given  Plan Discussed with: CRNA, Anesthesiologist and Surgeon  Anesthesia Plan Comments:         Anesthesia Quick Evaluation

## 2011-06-13 NOTE — Transfer of Care (Signed)
Immediate Anesthesia Transfer of Care Note  Patient: Kimberly [name]  Procedure(s) Performed: Procedure(s) (LRB): ENDARTERECTOMY CAROTID (Left)  Patient Location: PACU  Anesthesia Type: General  Level of Consciousness: awake, alert  and oriented  Airway & Oxygen Therapy: Patient Spontanous Breathing and Patient connected to nasal cannula oxygen  Post-op Assessment: Report given to PACU RN, Post -op Vital signs reviewed and stable, Patient moving all extremities X 4 and Patient able to stick tongue midline  Post vital signs: Reviewed and stable  Complications: No apparent anesthesia complications

## 2011-06-13 NOTE — Interval H&P Note (Signed)
History and Physical Interval Note:  06/13/2011 7:19 AM  Kimberly [name]  has presented today for surgery, with the diagnosis of left carotid stenosis  The various methods of treatment have been discussed with the patient and family. After consideration of risks, benefits and other options for treatment, the patient has consented to  Procedure(s) (LRB): ENDARTERECTOMY CAROTID (Left) as a surgical intervention .  The patients' history has been reviewed, patient examined, no change in status, stable for surgery.  I have reviewed the patients' chart and labs.  Questions were answered to the patient's satisfaction.     Chaunda Vandergriff IV, V. WELLS

## 2011-06-13 NOTE — Preoperative (Signed)
Beta Blockers   Reason not to administer Beta Blockers:Not Applicable 

## 2011-06-13 NOTE — H&P (View-Only) (Signed)
[facility] [place]   Patient name: Kimberly [name] MRN: [id number] DOB: 04/04/1950 Sex: female     Chief Complaint  Patient presents with  . Carotid    1 month f/u  . Sub-clavian    HISTORY OF PRESENT ILLNESS: The patient comes back today for followup. She is status post aortobifemoral bypass grafting in February for lifestyle limiting claudication. A month or so postoperatively she developed a small bowel obstruction which required exploratory laparotomy and lysis of adhesions. During this time she was noted to have a bilateral carotid stenosis, left greater than right with the left greater than 80%. We have been entertaining proceeding with left carotid endarterectomy but she has had multiple complaints following her surgery and has not been ready to proceed. Today she is back for further evaluation. She still complains of pain in her midline incision as well as decreased energy. She denies neurologic symptoms. Specifically she denies numbness or weakness in either extremity although she does have pain in her right shoulder down and her right arm. She denies amaurosis fugax. She denies slurred speech.  Past Medical History  Diagnosis Date  . Uterine cancer   . Asthma   . Hyperlipidemia   . History of lead poisoning 1977  . Shortness of breath   . Peripheral vascular disease     claudication  right greater than left  femoral artery  . Anxiety   . Angina   . Blood transfusion   . Migraine   . Subclavian artery disease     left  . Small bowel obstruction, partial 04/08/11    Past Surgical History  Procedure Date  . Abdominal hysterectomy   . Abdominal angiogram   . Aorta - bilateral femoral artery bypass graft 02/26/2011    Procedure: AORTA BIFEMORAL BYPASS GRAFT;  Surgeon: Juleen China, MD;  Location: [facility] OR;  Service: Vascular;  Laterality: N/A;  . Pr vein bypass graft,aorto-fem-pop 02/26/11  . Laparotomy 04/10/2011    Procedure: EXPLORATORY  LAPAROTOMY;  Surgeon: Kandis Cocking, MD;  Location: [facility] OR;  Service: General;  Laterality: N/A;  ENTEROLYSIS OF ADHESIONS    History   Social History  . Marital Status: Legally Separated    Spouse Name: N/A    Number of Children: N/A  . Years of Education: N/A   Occupational History  . Not on file.   Social History Main Topics  . Smoking status: Former Smoker -- 0.5 packs/day for 45 years    Types: Cigarettes    Quit date: 02/24/2011  . Smokeless tobacco: Never Used  . Alcohol Use: No  . Drug Use: Yes     History of Cocaine and marijuana abuse: "not in many years" (04/08/11  . Sexually Active: Not Currently   Other Topics Concern  . Not on file   Social History Narrative  . No narrative on file    Family History  Problem Relation Age of Onset  . Cancer Mother     Allergies as of 05/27/2011 - Review Complete 05/27/2011  Allergen Reaction Noted  . Codeine Nausea Only 05/02/2011    Current Outpatient Prescriptions on File Prior to Visit  Medication Sig Dispense Refill  . acetaminophen (TYLENOL) 500 MG tablet Take 500 mg by mouth every 6 (six) hours as needed.      Marland Kitchen aspirin EC 81 MG tablet Take 81 mg by mouth daily.      Marland Kitchen ibuprofen (ADVIL,MOTRIN) 200 MG tablet Take 400-600 mg by mouth every  6 (six) hours as needed. For pain      . Multiple Vitamin (MULITIVITAMIN WITH MINERALS) TABS Take 1 tablet by mouth daily.      . TraMADol HCl (ULTRAM PO) Take by mouth.         REVIEW OF SYSTEMS: Please see history of present illness, otherwise all systems negative  PHYSICAL EXAMINATION:   Vital signs are BP 90/54  Pulse 67  Resp 16  Ht 5\' 4"  (1.626 m)  Wt 133 lb 3.2 oz (60.419 kg)  BMI 22.86 kg/m2  SpO2 99% General: The patient appears their stated age. HEENT:  No gross abnormalities Pulmonary:  Non labored breathing Abdomen: Soft and non-tender. No hernia is appreciated although she does have significant midline tenderness in the supra-umbilical region. Her abdomen  is nondistended Musculoskeletal: There are no major deformities. Neurologic: No focal weakness or paresthesias are detected, Skin: There are no ulcer or rashes noted. Psychiatric: The patient has normal affect. Cardiovascular: There is a regular rate and rhythm without significant murmur appreciated. Palpable femoral pulses   Diagnostic Studies I have again reviewed her carotid duplex which reveals greater than 80% left carotid stenosis with end-diastolic velocity of 126. She has 60-79% right carotid stenosis  Assessment: Carotid stenosis, asymptomatic, left greater than right  Plan: We discussed proceeding with left carotid endarterectomy. At this time she wishes to get this done. We discussed the risks and benefits of the operation including the risk of stroke, the risk of nerve injury, and cardiopulmonary complications, and the need for followup. Her operation is been scheduled for June 6   V. Charlena Cross, M.D. [facility] [place] Office: [phone] Pager:  [phone]

## 2011-06-13 NOTE — Progress Notes (Signed)
ANTIBIOTIC CONSULT NOTE - INITIAL  Pharmacy Consult for antibiotic adjustment Indication: s/p carotid endarterectomy  Allergies  Allergen Reactions  . Other Shortness Of Breath    Cologne  . Codeine Nausea Only    dizziness    Patient Measurements: Height: 5\' 4"  (162.6 cm) Weight: 130 lb (58.968 kg) IBW/kg (Calculated) : 54.7   Vital Signs: Temp: 97.2 F (36.2 C) (06/06 1008) Temp src: Oral (06/06 0603) BP: 115/56 mmHg (06/06 1319) Pulse Rate: 71  (06/06 1400)  Labs:  Basename 06/11/11 1214  WBC 8.7  HGB 12.7  PLT 230  LABCREA --  CREATININE 0.70   Estimated Creatinine Clearance: 65.4 ml/min (by C-G formula based on Cr of 0.7).  Medical History: Past Medical History  Diagnosis Date  . Asthma   . Hyperlipidemia   . History of lead poisoning 1977  . Shortness of breath   . Peripheral vascular disease     claudication  right greater than left  femoral artery  . Anxiety   . Blood transfusion   . Migraine   . Subclavian artery disease     left  . Small bowel obstruction, partial 04/08/11  . Angina     March 2013  . Depression   . Uterine cancer     Pre cervical    Medications:  Scheduled:    . amoxicillin  500 mg Oral TID  . aspirin EC  81 mg Oral Daily  . cefUROXime (ZINACEF)  IV  1.5 g Intravenous 30 min Pre-Op  . cefUROXime (ZINACEF)  IV  1.5 g Intravenous Q12H  . docusate sodium  100 mg Oral Daily  . HYDROmorphone      . multivitamin with minerals  1 tablet Oral Daily  . mupirocin ointment   Nasal BID  . mupirocin ointment      . pantoprazole  40 mg Oral Q1200  . DISCONTD: aspirin EC  325 mg Oral Daily   Assessment: 61 yo female s/p L carotid endarterectomy on zinacef post-op (end 06/14/11) and also noted on ampicillin po.  SCr=0.7 with CrCl~65.   Plan:  -No dosing adjustments needed for renal function -Consider defining length of therapy for ampicillin  Will sign off for now. Thank you for asking pharmacy to be involved in the care of this  patient. Please call with any questions.  Harland German, Pharm D 06/13/2011 2:49 PM

## 2011-06-14 ENCOUNTER — Telehealth: Payer: Self-pay | Admitting: Surgery

## 2011-06-14 ENCOUNTER — Encounter (HOSPITAL_COMMUNITY): Payer: Self-pay | Admitting: Pulmonary Disease

## 2011-06-14 LAB — CBC
HCT: 31.9 % — ABNORMAL LOW (ref 36.0–46.0)
Hemoglobin: 10.9 g/dL — ABNORMAL LOW (ref 12.0–15.0)
MCH: 33.4 pg (ref 26.0–34.0)
MCHC: 34.2 g/dL (ref 30.0–36.0)
MCV: 97.9 fL (ref 78.0–100.0)
Platelets: 187 10*3/uL (ref 150–400)
RBC: 3.26 MIL/uL — ABNORMAL LOW (ref 3.87–5.11)
RDW: 13.8 % (ref 11.5–15.5)
WBC: 10.9 10*3/uL — ABNORMAL HIGH (ref 4.0–10.5)

## 2011-06-14 LAB — BASIC METABOLIC PANEL
BUN: 13 mg/dL (ref 6–23)
CO2: 26 mEq/L (ref 19–32)
Calcium: 8.5 mg/dL (ref 8.4–10.5)
Chloride: 106 mEq/L (ref 96–112)
Creatinine, Ser: 0.62 mg/dL (ref 0.50–1.10)
GFR calc Af Amer: >90 mL/min (ref >90)
GFR calc non Af Amer: >90 mL/min (ref >90)
Glucose, Bld: 118 mg/dL — ABNORMAL HIGH (ref 70–99)
Potassium: 3.9 mEq/L (ref 3.5–5.1)
Sodium: 141 mEq/L (ref 135–145)

## 2011-06-14 MED ORDER — SODIUM CHLORIDE 0.9 % IV BOLUS (SEPSIS)
500.0000 mL | Freq: Once | INTRAVENOUS | Status: AC
  Administered 2011-06-14: 500 mL via INTRAVENOUS

## 2011-06-14 MED ORDER — TRAMADOL HCL 50 MG PO TABS
50.0000 mg | ORAL_TABLET | Freq: Four times a day (QID) | ORAL | Status: DC | PRN
  Administered 2011-06-14: 50 mg via ORAL
  Filled 2011-06-14: qty 1

## 2011-06-14 MED ORDER — TRAMADOL HCL 50 MG PO TABS: 50.0000 mg | ORAL_TABLET | Freq: Four times a day (QID) | ORAL | Status: DC | PRN

## 2011-06-14 MED ORDER — TRAMADOL HCL 50 MG PO TABS: 50.0000 mg | ORAL_TABLET | Freq: Four times a day (QID) | ORAL | Status: AC | PRN

## 2011-06-14 NOTE — Progress Notes (Signed)
Pt is being discharged home. Pt verbalized the understanding of follow up appointments and discharge instructions. Pt was given her prescriptions and was informed when to take next dose. Educated her on when to call the MD if her incision showed any signs of infection or if her pain was uncontrolled. Pt was taken to the lobby to meet family by wheelchair.

## 2011-06-14 NOTE — Progress Notes (Signed)
VASCULAR AND VEIN SURGERY POST - OP CEA PROGRESS NOTE  Date of Surgery: 06/13/2011 Surgeon: Surgeon(s): Nada Libman, MD 1 Day Post-Op left Carotid Endarterectomy .  HPI: Kimberly [name] is a 61 y.o. female who is 1 Day Post-Op left Carotid Endarterectomy . Patient is doing well. Patient denies headache; Patient denies difficulty swallowing; denies weakness in upper or lower extremities; Pt. denies other symptoms of stroke or TIA. She c/o some bilat shin achiness  IMAGING: No results found.  Significant Diagnostic Studies: CBC Lab Results  Component Value Date   WBC 10.9* 06/14/2011   HGB 10.9* 06/14/2011   HCT 31.9* 06/14/2011   MCV 97.9 06/14/2011   PLT 187 06/14/2011    BMET    Component Value Date/Time   NA 141 06/14/2011 0400   K 3.9 06/14/2011 0400   CL 106 06/14/2011 0400   CO2 26 06/14/2011 0400   GLUCOSE 118* 06/14/2011 0400   BUN 13 06/14/2011 0400   CREATININE 0.62 06/14/2011 0400   CALCIUM 8.5 06/14/2011 0400   GFRNONAA >90 06/14/2011 0400   GFRAA >90 06/14/2011 0400    COAG Lab Results  Component Value Date   INR 0.98 06/11/2011   INR 0.99 04/09/2011   INR 1.08 02/26/2011   No results found for this basename: PTT      Intake/Output Summary (Last 24 hours) at 06/14/11 0732 Last data filed at 06/14/11 0605  Gross per 24 hour  Intake 5248.42 ml  Output   1330 ml  Net 3918.42 ml    Physical Exam:  BP Readings from Last 3 Encounters:  06/13/11 105/49  06/13/11 105/49  06/11/11 121/76   Temp Readings from Last 3 Encounters:  06/14/11 98.3 F (36.8 C) Oral  06/14/11 98.3 F (36.8 C) Oral  06/11/11 97.7 F (36.5 C) Oral   SpO2 Readings from Last 3 Encounters:  06/13/11 96%  06/13/11 96%  06/11/11 99%   Pulse Readings from Last 3 Encounters:  06/13/11 81  06/13/11 81  06/11/11 77    Pt is A&O x 3 Gait is normal Speech is fluent left Neck Wound is healing well Patient with Negative tongue deviation and Negative facial droop Pt has good and equal strength  in all extremities Palpable bilat. DP pulses Calves NT  Assessment: Kimberly [name] is a 61 y.o. female is S/P Left Carotid endarterectomy Pt is doing well neurologically She c/o some bilat shin discomfort but has palpable DP pulses   Plan: Discharge to: Home after ambulates and voids and takes po well Follow-up in 2 weeks   Bayron Dalto J [phone] 06/14/2011 7:32 AM

## 2011-06-14 NOTE — Telephone Encounter (Addendum)
Message copied by Rosalyn Charters on Fri Jun 14, 2011  9:57 AM ------      Message from: Sharee Pimple      Created: Fri Jun 14, 2011  8:29 AM      Regarding: schedule                   ----- Message -----         From: Dara Lords, PA         Sent: 06/13/2011   2:34 PM           To: Sharee Pimple, CMA            Sandoval,Katelinn FFemale, 61 y.o., [birth year]            S/p CEA by WB      F/u with him in 2 weeks.            Thanks,      Lelon Mast  notified patient of follow up appointment with dr. Myra Gianotti on 07-01-11 at 3:30

## 2011-06-14 NOTE — Discharge Summary (Signed)
[facility]ists Discharge Summary   Patient ID:  Kimberly [name] MRN: [id number] DOB/AGE: 1950/08/20 61 y.o.  Admit date: 06/13/2011 Discharge date: 06/14/2011 Date of Surgery: 06/13/2011 Surgeon: Surgeon(s): Nada Libman, MD  Admission Diagnosis: left carotid stenosis  Discharge Diagnoses:  left carotid stenosis  Secondary Diagnoses: Past Medical History  Diagnosis Date  . Asthma   . Hyperlipidemia   . History of lead poisoning 1977  . Shortness of breath   . Peripheral vascular disease     claudication  right greater than left  femoral artery  . Anxiety   . Blood transfusion   . Migraine   . Subclavian artery disease     left  . Small bowel obstruction, partial 04/08/11  . Angina     March 2013  . Depression   . Uterine cancer     Pre cervical    Procedure(s): ENDARTERECTOMY CAROTID  Discharged Condition: good  HPI:  Kimberly [name] is a 61 y.o. female who is status post aortobifemoral bypass grafting in February for lifestyle limiting claudication. A month or so postoperatively she developed a small bowel obstruction which required exploratory laparotomy and lysis of adhesions. During this time she was noted to have a bilateral carotid stenosis, left greater than right with the left greater than 80%. We have been entertaining proceeding with left carotid endarterectomy but she has had multiple complaints following her surgery and has not been ready to proceed. Today she is back for further evaluation. She still complains of pain in her midline incision as well as decreased energy. She denies neurologic symptoms. Specifically she denies numbness or weakness in either extremity although she does have pain in her right shoulder down and her right arm. She denies amaurosis fugax. She denies slurred speech. She was admitted for elective left CEA   Hospital Course:  Kimberly [name] is a 61 y.o. female is S/P Left Procedure(s): ENDARTERECTOMY  CAROTID Extubated: POD # 0 Post-op wounds healing well Pt. Ambulating, voiding and taking PO diet without difficulty. Pt pain controlled with PO pain meds. Labs as below Complications:none  Consults:     Significant Diagnostic Studies: CBC Lab Results  Component Value Date   WBC 10.9* 06/14/2011   HGB 10.9* 06/14/2011   HCT 31.9* 06/14/2011   MCV 97.9 06/14/2011   PLT 187 06/14/2011    BMET    Component Value Date/Time   NA 141 06/14/2011 0400   K 3.9 06/14/2011 0400   CL 106 06/14/2011 0400   CO2 26 06/14/2011 0400   GLUCOSE 118* 06/14/2011 0400   BUN 13 06/14/2011 0400   CREATININE 0.62 06/14/2011 0400   CALCIUM 8.5 06/14/2011 0400   GFRNONAA >90 06/14/2011 0400   GFRAA >90 06/14/2011 0400   COAG Lab Results  Component Value Date   INR 0.98 06/11/2011   INR 0.99 04/09/2011   INR 1.08 02/26/2011     Disposition:  Discharge to :Home  Discharge Orders    Future Appointments: Provider: Department: Dept Phone: Center:   06/27/2011 11:20 AM [facility] [phone] [facility]     Future Orders Please Complete By Expires   Resume previous diet      Driving Restrictions      Comments:   No driving for 1 weeks   Lifting restrictions      Comments:   No lifting for 6 weeks   Call MD for:  temperature >100.5      Call MD for:  redness, tenderness, or signs of infection (pain, swelling, bleeding, redness, odor or green/yellow discharge around incision site)      Call MD for:  severe or increased pain, loss or decreased feeling  in affected limb(s)      May shower       Comments:   May shower in 48 hours   Increase activity slowly      Comments:   Walk with assistance use walker or cane as needed      Lyly, [place]  Home Medication Instructions [id number]   Printed on:06/14/11 0740  Medication Information                    Multiple Vitamin (MULITIVITAMIN WITH MINERALS) TABS Take 1 tablet by mouth daily.           aspirin EC 81 MG tablet Take 81 mg by mouth  daily.           ibuprofen (ADVIL,MOTRIN) 200 MG tablet Take 400-600 mg by mouth every 6 (six) hours as needed. For pain           acetaminophen (TYLENOL) 500 MG tablet Take 1,000 mg by mouth every 6 (six) hours as needed. Pain.           amoxicillin (AMOXIL) 500 MG capsule Take 500 mg by mouth 3 (three) times daily. For ten days.           diclofenac (VOLTAREN) 75 MG EC tablet Take 75 mg by mouth 2 (two) times daily as needed. As needed for pain.           polyethylene glycol (MIRALAX / GLYCOLAX) packet Take 17 g by mouth as needed.           traMADol (ULTRAM) 50 MG tablet Take 1 tablet (50 mg total) by mouth every 6 (six) hours as needed.            Verbal and written Discharge instructions given to the patient. Wound care per Discharge AVS Follow-up Information    Follow up with Myra Gianotti IV, Lala Lund, MD in 2 weeks. (Office will call you to arrange your appt (sent))    Contact information:   [address] [place] [address] [phone]          Signed: Marlowe Shores 06/14/2011, 7:40 AM   /c

## 2011-06-14 NOTE — Care Management Note (Signed)
    Page 1 of 1   06/14/2011     2:38:32 PM   CARE MANAGEMENT NOTE 06/14/2011  Patient:  Kimberly [name], Kimberly [name]   Account Number:  [id number]  Date Initiated:  06/13/2011  Documentation initiated by:  Donn Pierini  Subjective/Objective Assessment:   Pt admitted s/p carotid     Action/Plan:   PTA pt lived at home   Anticipated DC Date:  06/14/2011   Anticipated DC Plan:  HOME/SELF CARE      DC Planning Services  CM consult      Choice offered to / List presented to:             Status of service:  Completed, signed off Medicare Important Message given?   (If response is "NO", the following Medicare IM given date fields will be blank) Date Medicare IM given:   Date Additional Medicare IM given:    Discharge Disposition:  HOME/SELF CARE  Per UR Regulation:  Reviewed for med. necessity/level of care/duration of stay  If discussed at Long Length of Stay Meetings, dates discussed:    Comments:  PCP- [place]  06/14/11- 1000- Donn Pierini RN, BSN [phone] Pt to d/c home, no needs.  06/13/11- 1520- Donn Pierini RN, BSN [phone] UR completed, pt admitted from PACU- plan for return home possible d/c in am if stable- no anticipated d/c needs

## 2011-06-15 NOTE — Discharge Summary (Signed)
Agree with above  Kimberly [name] 

## 2011-06-27 ENCOUNTER — Ambulatory Visit
Admission: RE | Admit: 2011-06-27 | Discharge: 2011-06-27 | Disposition: A | Discharge status: Home / Self Care | Payer: Medicaid Other | Source: Ambulatory Visit | Attending: Internal Medicine | Admitting: Internal Medicine

## 2011-06-27 DIAGNOSIS — Z1231 Encounter for screening mammogram for malignant neoplasm of breast: Secondary | ICD-10-CM

## 2011-06-28 ENCOUNTER — Encounter: Payer: Self-pay | Admitting: Surgery

## 2011-07-01 ENCOUNTER — Ambulatory Visit (INDEPENDENT_AMBULATORY_CARE_PROVIDER_SITE_OTHER): Payer: Medicaid Other | Admitting: Surgery

## 2011-07-01 ENCOUNTER — Encounter: Payer: Self-pay | Admitting: Surgery

## 2011-07-01 VITALS — BP 92/61 | HR 72 | Temp 98.0°F | Resp 14 | Ht 64.0 in | Wt 132.5 lb

## 2011-07-01 DIAGNOSIS — I6529 Occlusion and stenosis of unspecified carotid artery: Secondary | ICD-10-CM

## 2011-07-01 DIAGNOSIS — Z48812 Encounter for surgical aftercare following surgery on the circulatory system: Secondary | ICD-10-CM

## 2011-07-01 DIAGNOSIS — I739 Peripheral vascular disease, unspecified: Secondary | ICD-10-CM

## 2011-07-01 NOTE — Progress Notes (Signed)
The patient comes back today for followup. She is status post left carotid endarterectomy on 06/13/2011 for asymptomatic high-grade stenosis. Her postoperative course was uncomplicated. She has had no issues regarding her carotid disease since her discharge. She denies having a neurologic events.  On examination her incision is healing nicely. She is neurologically intact.  The patient's biggest complaint is abdominal pain which has been present since her repeat laparotomy for bowel obstruction. I do not feel any hernia on examination. She does have a fair amount of scar tissue were her redo incision was. I reassured her that I think this will resolve with time.  The patient will commence see me in 6 months with a carotid duplex and a lower extremity ultrasound

## 2011-07-01 NOTE — Addendum Note (Signed)
Addended by: Sharee Pimple on: 07/01/2011 03:42 PM   Modules accepted: Orders

## 2011-07-03 DIAGNOSIS — I6529 Occlusion and stenosis of unspecified carotid artery: Secondary | ICD-10-CM

## 2011-07-09 ENCOUNTER — Telehealth: Payer: Self-pay | Admitting: Surgery

## 2011-07-09 NOTE — Telephone Encounter (Signed)
FYI and future reference, dpm

## 2011-07-09 NOTE — Telephone Encounter (Signed)
Message copied by Fredrich Birks on Tue Jul 09, 2011  8:48 AM ------      Message from: Nada Libman      Created: Mon Jul 08, 2011 10:37 PM      Regarding: RE: CTA denial       I believe I dictated my thoughts in my last note.  Hopefully this will be acceptable      ----- Message -----         From: Fredrich Birks         Sent: 07/08/2011   4:00 PM           To: Nada Libman, MD      Subject: CTA denial                                               Ms. Holts is going to appeal the denial from Medicaid on her CTA. She said she had talked to you about this in the past, and wanted to give you a sort of "heads up" in the event that they need a letter stating the reason for her hospitalization.             She just stopped by the office to let you know that she has sent in the appeal.

## 2011-08-05 ENCOUNTER — Other Ambulatory Visit: Payer: Medicaid Other

## 2011-08-23 ENCOUNTER — Ambulatory Visit (INDEPENDENT_AMBULATORY_CARE_PROVIDER_SITE_OTHER): Payer: Medicaid Other | Admitting: Surgery

## 2011-08-23 VITALS — BP 118/70 | HR 68 | Temp 97.0°F | Resp 16 | Ht 64.0 in | Wt 131.0 lb

## 2011-08-23 DIAGNOSIS — R109 Unspecified abdominal pain: Secondary | ICD-10-CM

## 2011-08-23 DIAGNOSIS — K565 Intestinal adhesions [bands], unspecified as to partial versus complete obstruction: Secondary | ICD-10-CM

## 2011-08-23 NOTE — Progress Notes (Signed)
[facility]  Kimberly Kin, MD,  FACS [address] [place]  [address] [place], [place]    [address] Phone:  [phone] FAX:  [phone]   Re:   Kimberly Kimberly [name] DOB:   11-28-1950 MRN:   [id number]  ASSESSMENT AND PLAN: 1. Enterolysis of adhesions for SBO - D. Dione Petron - 04/10/2011.  Patient discharged 04/21/2011 from [facility].  Has had right sided abdominal pain which has never resolved.  She is still filing for disability, but has not had any success.   1a.  Right sided abdominal pain  Made worse by physical activity, not eating. It is hard to make out what is causing so much pain.  But she has no GI symptoms with the pain.  Possible musculoskeletal cause of pain - will go back to PCP and discuss PT vs ortho consult.  I ordered no test on her today.  I think Kimberly [name] CT scan would be very low yield.  She will follow up with Dr. Concepcion Elk.    Her return to me is PRN.  2. Significant PVD (peripheral vascular disease)  S/p aortobifem - W. Myra Gianotti - 02/26/2011.   Had left carotid done June 2013.  Still needs work on right carotids. 3. Mesenteric artery stenosis  4. History of smoking   Back smoking 3 to 5 cigarettes per day.  HISTORY OF PRESENT ILLNESS: Chief Complaint  Patient presents with  . Routine Post Op    post sbo   Kimberly Kimberly [name] is Kimberly [name] 61 y.o. (DOB: Jan 04, 1951)  white female who is Kimberly [name] patient of AVBUERE,Kimberly A, MD (she was at [facility] until it closed) and comes persistent abdominal pain.  She has abdominal pain with any bending or significant movement.  She had to move slowly to lay down on our exam table.  She said her abdomen hurts every day and aches Kimberly [name] lot.  She said it has ruined her self esteem and physical ability and no one has been able to help her.  She is eating without nausea.  Note her weight is up from when I last saw her.  She takes Miralax 3 times per week.  She has gained 6 pounds since I last saw her.  She has seen Dr. Myra Gianotti back and discussed  the pain with him.  She did see Kimberly [name] neurologist whose name she cannot remember (she called him Dr. Thresa Ross because of his needles).  She is trying for disability, but has gotten no where with that.  Social History: She has Kimberly [name] daughter who is Kimberly [name] drug addict that has been Kimberly [name] problem for her for years.   She takes care of her daughter's two children (her grandchildren).   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 But does not sound like she has been able to work for at least 5 months.   PHYSICAL EXAM: BP 118/70  Pulse 68  Temp 97 F (36.1 C)  Resp 16  Ht 5\' 4"  (1.626 m)  Wt 131 lb (59.421 kg)  BMI 22.49 kg/m2  General: WN older WF who is alert and generally healthy appearing.  Poor teeth HEENT: Normal. Pupils equal.  Neck: Supple. No mass.  No thyroid mass. Lymph Nodes:  No supraclavicular or cervical nodes. Lungs: Clear to auscultation and symmetric breath sounds. Heart:  RRR. No murmur or rub.  Abdomen: Soft. No mass. Her midline incision is well healed. No hernia.  Tender right abdomen, but no peritoneal signs. Extremities:  Moves slowly due to abdominal pain.  DATA REVIEWED: Hospital records.  Kimberly Kin, MD, FACS Office:  [phone]

## 2011-08-27 ENCOUNTER — Ambulatory Visit
Admission: RE | Admit: 2011-08-27 | Discharge: 2011-08-27 | Disposition: A | Discharge status: Home / Self Care | Payer: Medicaid Other | Source: Ambulatory Visit | Attending: Internal Medicine | Admitting: Internal Medicine

## 2011-08-27 DIAGNOSIS — M810 Age-related osteoporosis without current pathological fracture: Secondary | ICD-10-CM

## 2011-09-23 ENCOUNTER — Encounter: Payer: Self-pay | Admitting: Surgery

## 2011-10-29 DIAGNOSIS — Z0279 Encounter for issue of other medical certificate: Secondary | ICD-10-CM

## 2011-12-04 ENCOUNTER — Other Ambulatory Visit: Payer: Self-pay | Admitting: *Deleted

## 2011-12-04 ENCOUNTER — Encounter: Payer: Self-pay | Admitting: Surgery

## 2011-12-04 DIAGNOSIS — I739 Peripheral vascular disease, unspecified: Secondary | ICD-10-CM

## 2011-12-09 ENCOUNTER — Encounter: Payer: Self-pay | Admitting: Surgery

## 2011-12-09 ENCOUNTER — Encounter (INDEPENDENT_AMBULATORY_CARE_PROVIDER_SITE_OTHER): Payer: Medicaid Other | Admitting: *Deleted

## 2011-12-09 ENCOUNTER — Ambulatory Visit (INDEPENDENT_AMBULATORY_CARE_PROVIDER_SITE_OTHER): Payer: Medicaid Other | Admitting: Surgery

## 2011-12-09 ENCOUNTER — Other Ambulatory Visit (INDEPENDENT_AMBULATORY_CARE_PROVIDER_SITE_OTHER): Payer: Medicaid Other | Admitting: *Deleted

## 2011-12-09 VITALS — BP 86/47 | HR 75 | Ht 64.0 in | Wt 142.3 lb

## 2011-12-09 DIAGNOSIS — Z48812 Encounter for surgical aftercare following surgery on the circulatory system: Secondary | ICD-10-CM

## 2011-12-09 DIAGNOSIS — R531 Weakness: Secondary | ICD-10-CM

## 2011-12-09 DIAGNOSIS — R5381 Other malaise: Secondary | ICD-10-CM

## 2011-12-09 DIAGNOSIS — R5383 Other fatigue: Secondary | ICD-10-CM

## 2011-12-09 DIAGNOSIS — H547 Unspecified visual loss: Secondary | ICD-10-CM

## 2011-12-09 DIAGNOSIS — I739 Peripheral vascular disease, unspecified: Secondary | ICD-10-CM

## 2011-12-09 DIAGNOSIS — I6529 Occlusion and stenosis of unspecified carotid artery: Secondary | ICD-10-CM

## 2011-12-09 NOTE — Addendum Note (Signed)
Addended by: Sharee Pimple on: 12/09/2011 01:27 PM   Modules accepted: Orders

## 2011-12-09 NOTE — Progress Notes (Signed)
[facility] [place]   Patient name: Kimberly [name] MRN: [id number] DOB: 11-27-1950 Sex: female     Chief Complaint  Patient presents with  . PVD    6 month f/u, s/p aorto bifem BPG 02/26/2011- pt states "I'm having so many issues but I will talk to Dr. Myra Gianotti about it"  . Carotid    6 month f/u, s/p right CEA 06/13/2011    HISTORY OF PRESENT ILLNESS: The patient is back today for followup. 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 She went back for a lysis of adhesions secondary to abdominal pain in April. In June she underwent left carotid endarterectomy for high-grade asymptomatic stenosis. From a vascular standpoint she is doing well, however she has multiple other issues. She has recently been diagnosed with fibromyalgia. She comes in today complaining of 2 episodes of bilateral vision loss which lasted approximately 5 minutes. The most recent was 2 weeks ago. She also has complaints of hypersensitivity in her right leg. She does have weakness in her right arm and right leg. All of her symptoms aren't associated with dizziness. She also has frequent migraine headaches. She is under a lot of stress and has not been able to sleep.  Past Medical History  Diagnosis Date  . Asthma   . Hyperlipidemia   . History of lead poisoning 1977  . Shortness of breath   . Peripheral vascular disease     claudication  right greater than left  femoral artery  . Anxiety   . Blood transfusion   . Migraine   . Subclavian artery disease     left  . Small bowel obstruction, partial 04/08/11  . Angina     March 2013  . Depression   . Uterine cancer     Pre cervical    Past Surgical History  Procedure Date  . Abdominal angiogram   . Aorta - bilateral femoral artery bypass graft 02/26/2011    Procedure: AORTA BIFEMORAL BYPASS GRAFT;  Surgeon: Juleen China, MD;  Location: [facility] OR;  Service: Vascular;  Laterality: N/A;  . Pr vein bypass graft,aorto-fem-pop  02/26/11  . Laparotomy 04/10/2011    Procedure: EXPLORATORY LAPAROTOMY;  Surgeon: Kandis Cocking, MD;  Location: [facility] OR;  Service: General;  Laterality: N/A;  ENTEROLYSIS OF ADHESIONS  . Abdominal hysterectomy   . Endarterectomy 06/13/2011    Procedure: ENDARTERECTOMY CAROTID;  Surgeon: Nada Libman, MD;  Location: [facility] OR;  Service: Vascular;  Laterality: Left;  Left carotid artery endarterectomy with vascu-guard patch angioplasty  . Carotid endarterectomy 06/13/2011    Left  CEA    History   Social History  . Marital Status: Legally Separated    Spouse Name: N/A    Number of Children: N/A  . Years of Education: N/A   Occupational History  . Not on file.   Social History Main Topics  . Smoking status: Current Some Day Smoker -- 0.0 packs/day for 45 years    Types: Cigarettes    Last Attempt to Quit: 02/24/2011  . Smokeless tobacco: Never Used  . Alcohol Use: No  . Drug Use: No     Comment: History of Cocaine and marijuana abuse: "not in many years" (04/08/11  . Sexually Active: Not Currently   Other Topics Concern  . Not on file   Social History Narrative  . No narrative on file    Family History  Problem Relation Age of Onset  . Cancer Mother   .  Anesthesia problems Neg Hx     Allergies as of 12/09/2011 - Review Complete 12/09/2011  Allergen Reaction Noted  . Other Shortness Of Breath 06/11/2011  . Codeine Nausea Only 05/02/2011    Current Outpatient Prescriptions on File Prior to Visit  Medication Sig Dispense Refill  . acetaminophen (TYLENOL) 500 MG tablet Take 1,000 mg by mouth every 6 (six) hours as needed. Pain.      Marland Kitchen aspirin EC 81 MG tablet Take 81 mg by mouth daily.      Marland Kitchen ibuprofen (ADVIL,MOTRIN) 200 MG tablet Take 400-600 mg by mouth every 6 (six) hours as needed. For pain      . Multiple Vitamin (MULITIVITAMIN WITH MINERALS) TABS Take 1 tablet by mouth daily.      . polyethylene glycol (MIRALAX / GLYCOLAX) packet Take 17 g by mouth as needed.      Marland Kitchen  amoxicillin (AMOXIL) 500 MG capsule Take 500 mg by mouth 3 (three) times daily. For ten days.      . diclofenac (VOLTAREN) 75 MG EC tablet Take 75 mg by mouth 2 (two) times daily as needed. As needed for pain.         REVIEW OF SYSTEMS: Please see history of present illness, otherwise negative  PHYSICAL EXAMINATION:   Vital signs are BP 86/47  Pulse 75  Ht 5\' 4"  (1.626 m)  Wt 142 lb 4.8 oz (64.547 kg)  BMI 24.43 kg/m2  SpO2 100% General: The patient appears their stated age. HEENT:  No gross abnormalities Pulmonary:  Non labored breathing Abdomen: Soft and non-tender midline incision is well-healed without hernia Musculoskeletal: There are no major deformities. Neurologic: Hypersensitive right foot Skin: There are no ulcer or rashes noted. Psychiatric: She seems somewhat depressed today Cardiovascular: There is a regular rate and rhythm without significant murmur appreciated. No carotid bruits. Palpable dorsalis pedis pulse bilaterally   Diagnostic Studies Carotid duplex today shows right side of 60-79% stenosis. The left is 40-59%. ABI on the right is 1.1. On the left is 0.99. Both with triphasic waveforms.  Assessment: #1 carotid occlusive disease #2 peripheral vascular disease Plan: From a carotid standpoint, her velocity profile on the right has decreased with left carotid endarterectomy. She remained asymptomatic, and therefore I will repeat her surveillance ultrasound in 6 months if it is covered by Medicaid.  The patient's symptoms of claudication have resolved with her aortobifemoral bypass graft. Her abdominal pain has also improved.  The patient's other complaints today such as vision loss and right-sided weakness or somewhat concerning. These symptoms are new since I last saw her. I further that I recommended a neurologist consultation to help sort these out. I will have this arranged.  Jorge Ny, M.D. [facility] [place] Office:  [phone] Pager:  [phone]

## 2011-12-16 ENCOUNTER — Other Ambulatory Visit: Payer: Self-pay | Admitting: Diagnostic Neuroimaging

## 2011-12-16 DIAGNOSIS — M797 Fibromyalgia: Secondary | ICD-10-CM

## 2011-12-16 DIAGNOSIS — G459 Transient cerebral ischemic attack, unspecified: Secondary | ICD-10-CM

## 2011-12-16 DIAGNOSIS — H539 Unspecified visual disturbance: Secondary | ICD-10-CM

## 2011-12-16 DIAGNOSIS — G4700 Insomnia, unspecified: Secondary | ICD-10-CM

## 2011-12-25 ENCOUNTER — Ambulatory Visit
Admission: RE | Admit: 2011-12-25 | Discharge: 2011-12-25 | Disposition: A | Discharge status: Home / Self Care | Payer: Medicaid Other | Source: Ambulatory Visit | Attending: Diagnostic Neuroimaging | Admitting: Diagnostic Neuroimaging

## 2011-12-25 DIAGNOSIS — H539 Unspecified visual disturbance: Secondary | ICD-10-CM

## 2011-12-25 DIAGNOSIS — M797 Fibromyalgia: Secondary | ICD-10-CM

## 2011-12-25 DIAGNOSIS — G459 Transient cerebral ischemic attack, unspecified: Secondary | ICD-10-CM

## 2011-12-25 DIAGNOSIS — G4700 Insomnia, unspecified: Secondary | ICD-10-CM

## 2012-01-06 ENCOUNTER — Other Ambulatory Visit: Payer: Medicaid Other

## 2012-01-06 ENCOUNTER — Ambulatory Visit: Payer: Medicaid Other | Admitting: Surgery

## 2012-01-29 DIAGNOSIS — I739 Peripheral vascular disease, unspecified: Secondary | ICD-10-CM

## 2012-02-29 ENCOUNTER — Encounter: Payer: Self-pay | Admitting: Diagnostic Neuroimaging

## 2012-03-30 ENCOUNTER — Ambulatory Visit (INDEPENDENT_AMBULATORY_CARE_PROVIDER_SITE_OTHER): Payer: Medicaid Other | Admitting: Diagnostic Neuroimaging

## 2012-03-30 ENCOUNTER — Encounter: Payer: Self-pay | Admitting: Diagnostic Neuroimaging

## 2012-03-30 VITALS — BP 136/84 | HR 71 | Temp 97.8°F | Ht 64.0 in | Wt 154.0 lb

## 2012-03-30 DIAGNOSIS — I771 Stricture of artery: Secondary | ICD-10-CM

## 2012-03-30 DIAGNOSIS — G459 Transient cerebral ischemic attack, unspecified: Secondary | ICD-10-CM

## 2012-03-30 DIAGNOSIS — I6529 Occlusion and stenosis of unspecified carotid artery: Secondary | ICD-10-CM

## 2012-03-30 NOTE — Progress Notes (Signed)
[facility]  PATIENT: Kimberly [name] DOB: April 12, 1950  REFERRING CLINICIAN: Brabham HISTORY FROM: patient REASON FOR VISIT: follow up   HISTORICAL  CHIEF COMPLAINT:  Chief Complaint  Patient presents with  . Loss of Vision    HISTORY OF PRESENT ILLNESS:   UPDATE 03/30/12: Since last visit patient continues to have intermittent visual loss, blurred vision, photosensitivity, headaches, dizziness. Patient continues to smoke cigarettes. Patient tells me she's not taking Plavix because she cannot afford it. He takes a daily aspirin per day. She also takes some ibuprofen and tramadol as needed. Her primary care physician is Dr. Concepcion Elk. Patient is here with her 2 grandsons for this visit. Patient's social situation is quite stressed, she reports she is extremely impoverished and cannot afford her medications, appointments, treatments.  PRIOR HPI (12/13/11): 62 year old right-handed female with history of hypertension, hypercholesterolemia, peripheral vascular disease, carotid artery disease, here for evaluation of transient visual loss.  Patient reports 3 episodes of transient bilateral visual loss lasting less than 5 minutes each. This happened over the past one month. These are associated with lightheadedness and dizziness possible gait ataxia. No nausea, vomiting, vertigo, dysphagia or dysarthria. Patient was not taking aspirin during the time of these events.  Patient has extensive atherosclerotic disease including bilateral carotid disease, status post left carotid endarterectomy in June 2013. She also had a year ago femoral bypass graft in February 2013 with postoperative exploratory laparoscopy with lysis of adhesions.  Patient also struggles with significant insomnia, depression, anxiety, chronic pain. Patient continues to smoke cigarettes. She was previously on a statin medication but stopped because of side effects.  REVIEW OF SYSTEMS: Full 14 system review of  systems performed and notable only for fatigue, blurred vision, loss of vision, shortness of breath, joint pain, cramps, aching muscles, rash, itching.  ALLERGIES: Allergies  Allergen Reactions  . Other Shortness Of Breath    Cologne  . Codeine Nausea Only    dizziness    HOME MEDICATIONS: Outpatient Prescriptions Prior to Visit  Medication Sig Dispense Refill  . acetaminophen (TYLENOL) 500 MG tablet Take 1,000 mg by mouth every 6 (six) hours as needed. Pain.      Marland Kitchen ibuprofen (ADVIL,MOTRIN) 200 MG tablet Take 400-600 mg by mouth every 6 (six) hours as needed. For pain      . amoxicillin (AMOXIL) 500 MG capsule Take 500 mg by mouth 3 (three) times daily. For ten days.      Marland Kitchen aspirin EC 81 MG tablet Take 81 mg by mouth daily.      . diclofenac (VOLTAREN) 75 MG EC tablet Take 75 mg by mouth 2 (two) times daily as needed. As needed for pain.      . Multiple Vitamin (MULITIVITAMIN WITH MINERALS) TABS Take 1 tablet by mouth daily.      . polyethylene glycol (MIRALAX / GLYCOLAX) packet Take 17 g by mouth as needed.       No facility-administered medications prior to visit.    PAST MEDICAL HISTORY: Past Medical History  Diagnosis Date  . Asthma   . Hyperlipidemia   . History of lead poisoning 1977  . Shortness of breath   . Peripheral vascular disease     claudication  right greater than left  femoral artery  . Anxiety   . Blood transfusion   . Migraine   . Subclavian artery disease     left  . Small bowel obstruction, partial 04/08/11  . Angina     March 2013  .  Depression   . Uterine cancer     Pre cervical    PAST SURGICAL HISTORY: Past Surgical History  Procedure Laterality Date  . Abdominal angiogram    . Aorta - bilateral femoral artery bypass graft  02/26/2011    Procedure: AORTA BIFEMORAL BYPASS GRAFT;  Surgeon: Juleen China, MD;  Location: [facility] OR;  Service: Vascular;  Laterality: N/A;  . Pr vein bypass graft,aorto-fem-pop  02/26/11  . Laparotomy  04/10/2011     Procedure: EXPLORATORY LAPAROTOMY;  Surgeon: Kandis Cocking, MD;  Location: [facility] OR;  Service: General;  Laterality: N/A;  ENTEROLYSIS OF ADHESIONS  . Abdominal hysterectomy    . Endarterectomy  06/13/2011    Procedure: ENDARTERECTOMY CAROTID;  Surgeon: Nada Libman, MD;  Location: [facility] OR;  Service: Vascular;  Laterality: Left;  Left carotid artery endarterectomy with vascu-guard patch angioplasty  . Carotid endarterectomy  06/13/2011    Left  CEA    FAMILY HISTORY: Family History  Problem Relation Age of Onset  . Cancer Mother   . Anesthesia problems Neg Hx   . Cancer Brother   . Cancer Other   . Stroke Other     SOCIAL HISTORY:  History   Social History  . Marital Status: Legally Separated    Spouse Name: N/A    Number of Children: N/A  . Years of Education: N/A   Occupational History  . Not on file.   Social History Main Topics  . Smoking status: Current Some Day Smoker -- 0.05 packs/day for 45 years    Types: Cigarettes    Last Attempt to Quit: 02/24/2011  . Smokeless tobacco: Never Used  . Alcohol Use: No  . Drug Use: No     Comment: History of Cocaine and marijuana abuse: "not in many years" (04/08/11  . Sexually Active: Not Currently   Other Topics Concern  . Not on file   Social History Narrative   Pt lives at home with her 2 grandsons. She has been separated for 76yrs, has two children, and has a 12th grade education level. She drinks 3 cups of coffee daily.    PHYSICAL EXAM  Filed Vitals:   03/30/12 1528  BP: 136/84  Pulse: 71  Temp: 97.8 F (36.6 C)  TempSrc: Other (Comment)  Height: 5\' 4"  (1.626 m)  Weight: 154 lb (69.854 kg)   Body mass index is 26.42 kg/(m^2).  General: Patient is awake, alert and in no acute distress.  Well developed and groomed. POOR DENTITION. SMELLS OF CIG SMOKE. Neck: Neck is supple. Cardiovascular: No carotid artery bruits.  Heart is regular rate and rhythm with no murmurs. SIG DECR LEFT RADIAL PULSE. NO  BRUITS.  Neurologic Exam  Mental Status: Awake, alert. Language is fluent and comprehension intact. Cranial Nerves: No evidence of papilledema on funduscopic exam.  PHOTOSENSITIVE. Pupils are equal and reactive to light.  Visual fields are full to confrontation.  Conjugate eye movements are full and symmetric.  Facial sensation and strength are symmetric.  Hearing is intact.  Palate elevated symmetrically and uvula is midline.  Shoulder shrug is symmetric.  Tongue is midline. Motor: Normal bulk and tone.  Full strength in the upper and lower extremities.  No pronator drift. Sensory: Intact and symmetric to light touch, pinprick, temperature, vibration. Coordination: No ataxia or dysmetria on finger-nose or rapid alternating movement testing. Gait and Station: [employer]. USES CANE. Reflexes: Deep tendon reflexes in the upper and lower extremity are TRACE and symmetric.  DIAGNOSTIC DATA (  LABS, IMAGING, TESTING) - I reviewed patient records, labs, notes, testing and imaging myself where available.  Lab Results  Component Value Date   WBC 10.9* 06/14/2011   HGB 10.9* 06/14/2011   HCT 31.9* 06/14/2011   MCV 97.9 06/14/2011   PLT 187 06/14/2011      Component Value Date/Time   NA 141 06/14/2011 0400   K 3.9 06/14/2011 0400   CL 106 06/14/2011 0400   CO2 26 06/14/2011 0400   GLUCOSE 118* 06/14/2011 0400   BUN 13 06/14/2011 0400   CREATININE 0.62 06/14/2011 0400   CALCIUM 8.5 06/14/2011 0400   PROT 6.7 06/11/2011 1214   ALBUMIN 3.6 06/11/2011 1214   AST 15 06/11/2011 1214   ALT 25 06/11/2011 1214   ALKPHOS 71 06/11/2011 1214   BILITOT 0.3 06/11/2011 1214   GFRNONAA >90 06/14/2011 0400   GFRAA >90 06/14/2011 0400   Lab Results  Component Value Date   CHOL 233* 03/27/2010   HDL 57 03/27/2010   LDLCALC 163* 03/27/2010   TRIG 63 03/27/2010   CHOLHDL 4.1 Ratio 03/27/2010   No results found for this basename: HGBA1C   No results found for this basename: VITAMINB12   Lab Results  Component Value Date   TSH 3.625  04/08/2011    ASSESSMENT AND PLAN  62 y.o. year old female smoker, with hypertension hypercholesteremia, significant atherosclerotic disease including carotid artery disease, left subclavian steal syndrome, now with intermittent episodes of temporary visual loss, concerning for posterior circulation TIA.  Ddx: TIA (posterior ciculation), hypoperfusion, presyncope, cardiogenic  PLAN: 1.CTA head/neck (denied by medicaid last time I ordered it) 2. change aspirin to plavix if able to afford it 3. stop smoking 4. cholesterol control with PCP; needs a statin if able to tolerate it 5. F/u BP with PCP   Orders Placed This Encounter  Procedures  . CT Angio Head W/Cm &/Or Wo Cm  . CT Angio Neck W/Cm &/Or Wo/Cm     Suanne Marker, MD 03/30/2012, 4:00 PM Certified in Neurology, Neurophysiology and Neuroimaging  [facility] [address] [place], [place] [address] [phone]

## 2012-03-30 NOTE — Patient Instructions (Signed)
Smoking Cessation Quitting smoking is important to your health and has many advantages. However, it is not always easy to quit since nicotine is a very addictive drug. Often times, people try 3 times or more before being able to quit. This document explains the best ways for you to prepare to quit smoking. Quitting takes hard work and a lot of effort, but you can do it. ADVANTAGES OF QUITTING SMOKING  You will live longer, feel better, and live better.  Your body will feel the impact of quitting smoking almost immediately.  Within 20 minutes, blood pressure decreases. Your pulse returns to its normal level.  After 8 hours, carbon monoxide levels in the blood return to normal. Your oxygen level increases.  After 24 hours, the chance of having a heart attack starts to decrease. Your breath, hair, and body stop smelling like smoke.  After 48 hours, damaged nerve endings begin to recover. Your sense of taste and smell improve.  After 72 hours, the body is virtually free of nicotine. Your bronchial tubes relax and breathing becomes easier.  After 2 to 12 weeks, lungs can hold more air. Exercise becomes easier and circulation improves.  The risk of having a heart attack, stroke, cancer, or lung disease is greatly reduced.  After 1 year, the risk of coronary heart disease is cut in half.  After 5 years, the risk of stroke falls to the same as a nonsmoker.  After 10 years, the risk of lung cancer is cut in half and the risk of other cancers decreases significantly.  After 15 years, the risk of coronary heart disease drops, usually to the level of a nonsmoker.  If you are pregnant, quitting smoking will improve your chances of having a healthy baby.  The people you live with, especially any children, will be healthier.  You will have extra money to spend on things other than cigarettes. QUESTIONS TO THINK ABOUT BEFORE ATTEMPTING TO QUIT You may want to talk about your answers with your  caregiver.  Why do you want to quit?  If you tried to quit in the past, what helped and what did not?  What will be the most difficult situations for you after you quit? How will you plan to handle them?  Who can help you through the tough times? Your family? Friends? A caregiver?  What pleasures do you get from smoking? What ways can you still get pleasure if you quit? Here are some questions to ask your caregiver:  How can you help me to be successful at quitting?  What medicine do you think would be best for me and how should I take it?  What should I do if I need more help?  What is smoking withdrawal like? How can I get information on withdrawal? GET READY  Set a quit date.  Change your environment by getting rid of all cigarettes, ashtrays, matches, and lighters in your home, car, or work. Do not let people smoke in your home.  Review your past attempts to quit. Think about what worked and what did not. GET SUPPORT AND ENCOURAGEMENT You have a better chance of being successful if you have help. You can get support in many ways.  Tell your family, friends, and co-workers that you are going to quit and need their support. Ask them not to smoke around you.  Get individual, group, or telephone counseling and support. Programs are available at local hospitals and health centers. Call your local health department for   information about programs in your area.  Spiritual beliefs and practices may help some smokers quit.  Download a "quit meter" on your computer to keep track of quit statistics, such as how long you have gone without smoking, cigarettes not smoked, and money saved.  Get a self-help book about quitting smoking and staying off of tobacco. LEARN NEW SKILLS AND BEHAVIORS  Distract yourself from urges to smoke. Talk to someone, go for a walk, or occupy your time with a task.  Change your normal routine. Take a different route to work. Drink tea instead of coffee.  Eat breakfast in a different place.  Reduce your stress. Take a hot bath, exercise, or read a book.  Plan something enjoyable to do every day. Reward yourself for not smoking.  Explore interactive web-based programs that specialize in helping you quit. GET MEDICINE AND USE IT CORRECTLY Medicines can help you stop smoking and decrease the urge to smoke. Combining medicine with the above behavioral methods and support can greatly increase your chances of successfully quitting smoking.  Nicotine replacement therapy helps deliver nicotine to your body without the negative effects and risks of smoking. Nicotine replacement therapy includes nicotine gum, lozenges, inhalers, nasal sprays, and skin patches. Some may be available over-the-counter and others require a prescription.  Antidepressant medicine helps people abstain from smoking, but how this works is unknown. This medicine is available by prescription.  Nicotinic receptor partial agonist medicine simulates the effect of nicotine in your brain. This medicine is available by prescription. Ask your caregiver for advice about which medicines to use and how to use them based on your health history. Your caregiver will tell you what side effects to look out for if you choose to be on a medicine or therapy. Carefully read the information on the package. Do not use any other product containing nicotine while using a nicotine replacement product.  RELAPSE OR DIFFICULT SITUATIONS Most relapses occur within the first 3 months after quitting. Do not be discouraged if you start smoking again. Remember, most people try several times before finally quitting. You may have symptoms of withdrawal because your body is used to nicotine. You may crave cigarettes, be irritable, feel very hungry, cough often, get headaches, or have difficulty concentrating. The withdrawal symptoms are only temporary. They are strongest when you first quit, but they will go away within  10 14 days. To reduce the chances of relapse, try to:  Avoid drinking alcohol. Drinking lowers your chances of successfully quitting.  Reduce the amount of caffeine you consume. Once you quit smoking, the amount of caffeine in your body increases and can give you symptoms, such as a rapid heartbeat, sweating, and anxiety.  Avoid smokers because they can make you want to smoke.  Do not let weight gain distract you. Many smokers will gain weight when they quit, usually less than 10 pounds. Eat a healthy diet and stay active. You can always lose the weight gained after you quit.  Find ways to improve your mood other than smoking. FOR MORE INFORMATION  www.smokefree.gov  Document Released: 12/18/2000 Document Revised: 06/25/2011 Document Reviewed: 04/04/2011 Exit[employer] Patient Information 2013 [employer].  

## 2012-06-08 ENCOUNTER — Ambulatory Visit: Payer: Medicaid Other | Admitting: Neurosurgery

## 2012-06-08 ENCOUNTER — Other Ambulatory Visit: Payer: Medicaid Other

## 2012-06-12 ENCOUNTER — Encounter: Payer: Self-pay | Admitting: Surgery

## 2012-06-15 ENCOUNTER — Ambulatory Visit (INDEPENDENT_AMBULATORY_CARE_PROVIDER_SITE_OTHER): Payer: Medicaid Other | Admitting: Surgery

## 2012-06-15 ENCOUNTER — Ambulatory Visit: Payer: Self-pay | Admitting: Neurosurgery

## 2012-06-15 ENCOUNTER — Other Ambulatory Visit: Payer: Self-pay

## 2012-06-15 ENCOUNTER — Encounter: Payer: Self-pay | Admitting: Surgery

## 2012-06-15 ENCOUNTER — Encounter (HOSPITAL_COMMUNITY): Payer: Self-pay | Admitting: Pharmacy Technician

## 2012-06-15 DIAGNOSIS — I6529 Occlusion and stenosis of unspecified carotid artery: Secondary | ICD-10-CM

## 2012-06-15 NOTE — Progress Notes (Signed)
[facility] [place]   Patient name: Kimberly [name] MRN: [id number] DOB: 07/10/1950 Sex: female     Chief Complaint  Patient presents with  . Carotid    6 month f/u no lab study - pt c/o episodes of dizziness and loss of vision     HISTORY OF PRESENT ILLNESS: The patient is back today for followup. 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 She went back for a lysis of adhesions secondary to abdominal pain in April. In June she underwent left carotid endarterectomy for high-grade asymptomatic stenosis. When I last saw her she had new onset of dizziness and temporary vision loss. She was referred to neurology who felt that this was a subclavian steal. CT angiogram was ordered and recommended, however this was not approved by Medicaid. The patient is back for followup with me today. She states that her biggest complaint is that of dizziness. This even fracture her ability to get up because she is afraid she is going to fall down. She continues to have episodes of amaurosis fugax. She complains of pain in her lower abdomen as well as pain radiating down her legs. She has been diagnosed with fibromyalgia. She complains of numbness in her right arm as well as her left. There are no aggravating or relieving factors.   Past Medical History  Diagnosis Date  . Asthma   . Hyperlipidemia   . History of lead poisoning 1977  . Shortness of breath   . Peripheral vascular disease     claudication  right greater than left  femoral artery  . Anxiety   . Blood transfusion   . Migraine   . Subclavian artery disease     left  . Small bowel obstruction, partial 04/08/11  . Angina     March 2013  . Depression   . Uterine cancer     Pre cervical  . Fibromyalgia     Past Surgical History  Procedure Laterality Date  . Abdominal angiogram    . Aorta - bilateral femoral artery bypass graft  02/26/2011    Procedure: AORTA BIFEMORAL BYPASS GRAFT;  Surgeon: V Wells  Eleftheria Taborn, MD;  Location: [facility] OR;  Service: Vascular;  Laterality: N/A;  . Pr vein bypass graft,aorto-fem-pop  02/26/11  . Laparotomy  04/10/2011    Procedure: EXPLORATORY LAPAROTOMY;  Surgeon: David H Newman, MD;  Location: [facility] OR;  Service: General;  Laterality: N/A;  ENTEROLYSIS OF ADHESIONS  . Abdominal hysterectomy    . Endarterectomy  06/13/2011    Procedure: ENDARTERECTOMY CAROTID;  Surgeon: Masin Shatto W Jesselyn Rask, MD;  Location: [facility] OR;  Service: Vascular;  Laterality: Left;  Left carotid artery endarterectomy with vascu-guard patch angioplasty  . Carotid endarterectomy  06/13/2011    Left  CEA    History   Social History  . Marital Status: Legally Separated    Spouse Name: N/A    Number of Children: N/A  . Years of Education: N/A   Occupational History  . Not on file.   Social History Main Topics  . Smoking status: Current Some Day Smoker -- 0.05 packs/day for 45 years    Types: Cigarettes    Last Attempt to Quit: 02/24/2011  . Smokeless tobacco: Never Used     Comment: pt states that she smokes about 6 cigs per day  . Alcohol Use: No  . Drug Use: No     Comment: History of Cocaine and marijuana abuse: "not in many years" (04/08/11  .   Sexually Active: Not Currently   Other Topics Concern  . Not on file   Social History Narrative   Pt lives at home with her 2 grandsons. She has been separated for 3yrs, has two children, and has a 12th grade education level. She drinks 3 cups of coffee daily.    Family History  Problem Relation Age of Onset  . Cancer Mother   . Anesthesia problems Neg Hx   . Cancer Brother   . Cancer Other   . Stroke Other     Allergies as of 06/15/2012 - Review Complete 06/15/2012  Allergen Reaction Noted  . Other Shortness Of Breath 06/11/2011  . Codeine Nausea Only 05/02/2011    Current Outpatient Prescriptions on File Prior to Visit  Medication Sig Dispense Refill  . aspirin EC 81 MG tablet Take 81 mg by mouth daily.      . ibuprofen (ADVIL,MOTRIN) 200  MG tablet Take 400-600 mg by mouth every 6 (six) hours as needed. For pain      . Multiple Vitamin (MULITIVITAMIN WITH MINERALS) TABS Take 1 tablet by mouth daily.      . polyethylene glycol (MIRALAX / GLYCOLAX) packet Take 17 g by mouth as needed.      . traMADol (ULTRAM) 50 MG tablet Take 100 mg by mouth daily.      . clopidogrel (PLAVIX) 75 MG tablet Take 75 mg by mouth daily.       No current facility-administered medications on file prior to visit.     REVIEW OF SYSTEMS: Please see history of present illness, otherwise all systems are negative.  PHYSICAL EXAMINATION:   Vital signs are BP 114/72  Pulse 73  Ht 5' 4" (1.626 m)  Wt 157 lb 9.6 oz (71.487 kg)  BMI 27.04 kg/m2  SpO2 100% General: The patient appears their stated age. HEENT:  No gross abnormalities Pulmonary:  Non labored breathing Musculoskeletal: There are no major deformities. Neurologic: No focal weakness or paresthesias are detected, Skin: There are no ulcer or rashes noted. Psychiatric: The patient has normal affect. Cardiovascular: There is a regular rate and rhythm without significant murmur appreciated. Bilateral carotid and supraclavicular bruit. Palpable right radial pulse. The left radial pulse is not palpable   Diagnostic Studies none today  Assessment: Severe vascular disease, status post left carotid endarterectomy and aortobifemoral bypass graft Subclavian steel syndrome Plan: The patient will be scheduled for an ultrasound in 6 months Of Bilateral Carotid Arteries. Her most recent study revealed 60-79% right-sided carotid stenosis. The left side was in the 40-59% range.  Subclavian steel: I feel that the patient's symptoms of dizziness and amaurosis fugax are related to subclavian steal syndrome. I have gone back and reviewed her old CT scans, including her scans done to rule out pulmonary embolism. There appears to be a high-grade left subclavian artery stenosis at its ostium. This was certainly  explain her symptoms. In addition, she has been seen and evaluated by neurology who feels that this is most likely the diagnosis. In order to lower her risk for stroke or some other posterior circulation complication, I have recommended that she undergo aortic arch angiography and selective evaluation of bilateral subclavian arteries. If she is a candidate for stenting of her left subclavian artery lesion, I would proceed at that time. This is been scheduled for Wednesday, June 18  V. Wells Riyana Biel IV, M.D. [facility] [place] Office: [phone] Pager:  [phone]   

## 2012-06-18 ENCOUNTER — Other Ambulatory Visit: Payer: Self-pay

## 2012-06-23 MED ORDER — SODIUM CHLORIDE 0.9 % IV SOLN
INTRAVENOUS | Status: DC
  Administered 2012-06-24: 1000 mL via INTRAVENOUS

## 2012-06-24 ENCOUNTER — Ambulatory Visit (HOSPITAL_COMMUNITY)
Admission: RE | Admit: 2012-06-24 | Discharge: 2012-06-24 | Disposition: A | Discharge status: Home / Self Care | Payer: Medicaid Other | Source: Ambulatory Visit | Attending: Surgery | Admitting: Surgery

## 2012-06-24 ENCOUNTER — Telehealth: Payer: Self-pay | Admitting: Surgery

## 2012-06-24 ENCOUNTER — Encounter (HOSPITAL_COMMUNITY)
Admission: RE | Disposition: A | Discharge status: Home / Self Care | Payer: Self-pay | Source: Ambulatory Visit | Attending: Surgery

## 2012-06-24 DIAGNOSIS — F329 Major depressive disorder, single episode, unspecified: Secondary | ICD-10-CM | POA: Insufficient documentation

## 2012-06-24 DIAGNOSIS — G458 Other transient cerebral ischemic attacks and related syndromes: Secondary | ICD-10-CM

## 2012-06-24 DIAGNOSIS — F17200 Nicotine dependence, unspecified, uncomplicated: Secondary | ICD-10-CM | POA: Insufficient documentation

## 2012-06-24 DIAGNOSIS — J45909 Unspecified asthma, uncomplicated: Secondary | ICD-10-CM | POA: Insufficient documentation

## 2012-06-24 DIAGNOSIS — IMO0001 Reserved for inherently not codable concepts without codable children: Secondary | ICD-10-CM | POA: Insufficient documentation

## 2012-06-24 DIAGNOSIS — G43909 Migraine, unspecified, not intractable, without status migrainosus: Secondary | ICD-10-CM | POA: Insufficient documentation

## 2012-06-24 DIAGNOSIS — E785 Hyperlipidemia, unspecified: Secondary | ICD-10-CM | POA: Insufficient documentation

## 2012-06-24 DIAGNOSIS — I70219 Atherosclerosis of native arteries of extremities with intermittent claudication, unspecified extremity: Secondary | ICD-10-CM | POA: Insufficient documentation

## 2012-06-24 DIAGNOSIS — Z9889 Other specified postprocedural states: Secondary | ICD-10-CM | POA: Insufficient documentation

## 2012-06-24 DIAGNOSIS — F3289 Other specified depressive episodes: Secondary | ICD-10-CM | POA: Insufficient documentation

## 2012-06-24 HISTORY — PX: ARCH AORTOGRAM: SHX5501

## 2012-06-24 HISTORY — PX: LOWER EXTREMITY ANGIOGRAM: SHX5955

## 2012-06-24 LAB — POCT ACTIVATED CLOTTING TIME
Activated Clotting Time: 274 seconds
Activated Clotting Time: 220 seconds
Activated Clotting Time: 192 seconds
Activated Clotting Time: 176 seconds

## 2012-06-24 LAB — POCT I-STAT, CHEM 8
BUN: 17 mg/dL (ref 6–23)
Calcium, Ion: 1.16 mmol/L (ref 1.13–1.30)
Chloride: 106 mEq/L (ref 96–112)
Creatinine, Ser: 0.9 mg/dL (ref 0.50–1.10)
Glucose, Bld: 105 mg/dL — ABNORMAL HIGH (ref 70–99)
HCT: 41 % (ref 36.0–46.0)
Hemoglobin: 13.9 g/dL (ref 12.0–15.0)
Potassium: 4.1 mEq/L (ref 3.5–5.1)
Sodium: 141 mEq/L (ref 135–145)
TCO2: 27 mmol/L (ref 0–100)

## 2012-06-24 SURGERY — ARCH AORTOGRAM
Anesthesia: LOCAL

## 2012-06-24 MED ORDER — GUAIFENESIN-DM 100-10 MG/5ML PO SYRP: 15.0000 mL | ORAL_SOLUTION | ORAL | Status: DC | PRN

## 2012-06-24 MED ORDER — NITROGLYCERIN 0.2 MG/ML ON CALL CATH LAB
INTRAVENOUS | Status: AC
  Filled 2012-06-24: qty 1

## 2012-06-24 MED ORDER — HYDROMORPHONE HCL PF 2 MG/ML IJ SOLN
INTRAMUSCULAR | Status: AC
  Filled 2012-06-24: qty 1

## 2012-06-24 MED ORDER — FAMOTIDINE IN NACL 20-0.9 MG/50ML-% IV SOLN
INTRAVENOUS | Status: AC
  Filled 2012-06-24: qty 50

## 2012-06-24 MED ORDER — HEPARIN (PORCINE) IN NACL 2-0.9 UNIT/ML-% IJ SOLN
INTRAMUSCULAR | Status: AC
  Filled 2012-06-24: qty 1000

## 2012-06-24 MED ORDER — LIDOCAINE HCL (PF) 1 % IJ SOLN
INTRAMUSCULAR | Status: AC
  Filled 2012-06-24: qty 30

## 2012-06-24 MED ORDER — HEPARIN (PORCINE) IN NACL 2-0.9 UNIT/ML-% IJ SOLN
INTRAMUSCULAR | Status: AC
  Filled 2012-06-24: qty 500

## 2012-06-24 MED ORDER — HEPARIN SODIUM (PORCINE) 1000 UNIT/ML IJ SOLN
INTRAMUSCULAR | Status: AC
  Filled 2012-06-24: qty 1

## 2012-06-24 MED ORDER — METOPROLOL TARTRATE 1 MG/ML IV SOLN: 2.0000 mg | INTRAVENOUS | Status: DC | PRN

## 2012-06-24 MED ORDER — OXYCODONE-ACETAMINOPHEN 5-325 MG PO TABS
ORAL_TABLET | ORAL | Status: AC
  Filled 2012-06-24: qty 2

## 2012-06-24 MED ORDER — HYDRALAZINE HCL 20 MG/ML IJ SOLN: 10.0000 mg | INTRAMUSCULAR | Status: DC | PRN

## 2012-06-24 MED ORDER — CLOPIDOGREL BISULFATE 75 MG PO TABS: 75.0000 mg | ORAL_TABLET | Freq: Every day | ORAL | Status: DC

## 2012-06-24 MED ORDER — ACETAMINOPHEN 325 MG RE SUPP: 325.0000 mg | RECTAL | Status: DC | PRN

## 2012-06-24 MED ORDER — MIDAZOLAM HCL 2 MG/2ML IJ SOLN
INTRAMUSCULAR | Status: AC
  Filled 2012-06-24: qty 2

## 2012-06-24 MED ORDER — SODIUM CHLORIDE 0.9 % IV SOLN: 1.0000 mL/kg/h | INTRAVENOUS | Status: DC

## 2012-06-24 MED ORDER — ALUM & MAG HYDROXIDE-SIMETH 200-200-20 MG/5ML PO SUSP: 15.0000 mL | ORAL | Status: DC | PRN

## 2012-06-24 MED ORDER — CLOPIDOGREL BISULFATE 300 MG PO TABS
ORAL_TABLET | ORAL | Status: AC
  Filled 2012-06-24: qty 1

## 2012-06-24 MED ORDER — LABETALOL HCL 5 MG/ML IV SOLN: 10.0000 mg | INTRAVENOUS | Status: DC | PRN

## 2012-06-24 MED ORDER — FENTANYL CITRATE 0.05 MG/ML IJ SOLN
INTRAMUSCULAR | Status: AC
  Filled 2012-06-24: qty 2

## 2012-06-24 MED ORDER — CLOPIDOGREL BISULFATE 300 MG PO TABS: 300.0000 mg | ORAL_TABLET | Freq: Once | ORAL | Status: DC

## 2012-06-24 MED ORDER — OXYCODONE-ACETAMINOPHEN 5-325 MG PO TABS
1.0000 | ORAL_TABLET | ORAL | Status: DC | PRN
  Administered 2012-06-24: 2 via ORAL

## 2012-06-24 MED ORDER — ONDANSETRON HCL 4 MG/2ML IJ SOLN: 4.0000 mg | Freq: Four times a day (QID) | INTRAMUSCULAR | Status: DC | PRN

## 2012-06-24 MED ORDER — ACETAMINOPHEN 325 MG PO TABS: 325.0000 mg | ORAL_TABLET | ORAL | Status: DC | PRN

## 2012-06-24 MED ORDER — PHENOL 1.4 % MT LIQD: 1.0000 | OROMUCOSAL | Status: DC | PRN

## 2012-06-24 MED ORDER — HYDROMORPHONE HCL PF 2 MG/ML IJ SOLN
0.5000 mg | INTRAMUSCULAR | Status: DC | PRN
  Administered 2012-06-24: 1 mg via INTRAVENOUS

## 2012-06-24 NOTE — H&P (View-Only) (Signed)
[facility] [place]   Patient name: Kimberly [name] MRN: [id number] DOB: 1950-06-16 Sex: female     Chief Complaint  Patient presents with  . Carotid    6 month f/u no lab study - pt c/o episodes of dizziness and loss of vision     HISTORY OF PRESENT ILLNESS: The patient is back today for followup. 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 She went back for a lysis of adhesions secondary to abdominal pain in April. In June she underwent left carotid endarterectomy for high-grade asymptomatic stenosis. When I last saw her she had new onset of dizziness and temporary vision loss. She was referred to neurology who felt that this was a subclavian steal. CT angiogram was ordered and recommended, however this was not approved by Medicaid. The patient is back for followup with me today. She states that her biggest complaint is that of dizziness. This even fracture her ability to get up because she is afraid she is going to fall down. She continues to have episodes of amaurosis fugax. She complains of pain in her lower abdomen as well as pain radiating down her legs. She has been diagnosed with fibromyalgia. She complains of numbness in her right arm as well as her left. There are no aggravating or relieving factors.   Past Medical History  Diagnosis Date  . Asthma   . Hyperlipidemia   . History of lead poisoning 1977  . Shortness of breath   . Peripheral vascular disease     claudication  right greater than left  femoral artery  . Anxiety   . Blood transfusion   . Migraine   . Subclavian artery disease     left  . Small bowel obstruction, partial 04/08/11  . Angina     March 2013  . Depression   . Uterine cancer     Pre cervical  . Fibromyalgia     Past Surgical History  Procedure Laterality Date  . Abdominal angiogram    . Aorta - bilateral femoral artery bypass graft  02/26/2011    Procedure: AORTA BIFEMORAL BYPASS GRAFT;  Surgeon: Juleen China, MD;  Location: [facility] OR;  Service: Vascular;  Laterality: N/A;  . Pr vein bypass graft,aorto-fem-pop  02/26/11  . Laparotomy  04/10/2011    Procedure: EXPLORATORY LAPAROTOMY;  Surgeon: Kandis Cocking, MD;  Location: [facility] OR;  Service: General;  Laterality: N/A;  ENTEROLYSIS OF ADHESIONS  . Abdominal hysterectomy    . Endarterectomy  06/13/2011    Procedure: ENDARTERECTOMY CAROTID;  Surgeon: Nada Libman, MD;  Location: [facility] OR;  Service: Vascular;  Laterality: Left;  Left carotid artery endarterectomy with vascu-guard patch angioplasty  . Carotid endarterectomy  06/13/2011    Left  CEA    History   Social History  . Marital Status: Legally Separated    Spouse Name: N/A    Number of Children: N/A  . Years of Education: N/A   Occupational History  . Not on file.   Social History Main Topics  . Smoking status: Current Some Day Smoker -- 0.05 packs/day for 45 years    Types: Cigarettes    Last Attempt to Quit: 02/24/2011  . Smokeless tobacco: Never Used     Comment: pt states that she smokes about 6 cigs per day  . Alcohol Use: No  . Drug Use: No     Comment: History of Cocaine and marijuana abuse: "not in many years" (04/08/11  .  Sexually Active: Not Currently   Other Topics Concern  . Not on file   Social History Narrative   Pt lives at home with her 2 grandsons. She has been separated for 25yrs, has two children, and has a 12th grade education level. She drinks 3 cups of coffee daily.    Family History  Problem Relation Age of Onset  . Cancer Mother   . Anesthesia problems Neg Hx   . Cancer Brother   . Cancer Other   . Stroke Other     Allergies as of 06/15/2012 - Review Complete 06/15/2012  Allergen Reaction Noted  . Other Shortness Of Breath 06/11/2011  . Codeine Nausea Only 05/02/2011    Current Outpatient Prescriptions on File Prior to Visit  Medication Sig Dispense Refill  . aspirin EC 81 MG tablet Take 81 mg by mouth daily.      Marland Kitchen ibuprofen (ADVIL,MOTRIN) 200  MG tablet Take 400-600 mg by mouth every 6 (six) hours as needed. For pain      . Multiple Vitamin (MULITIVITAMIN WITH MINERALS) TABS Take 1 tablet by mouth daily.      . polyethylene glycol (MIRALAX / GLYCOLAX) packet Take 17 g by mouth as needed.      . traMADol (ULTRAM) 50 MG tablet Take 100 mg by mouth daily.      . clopidogrel (PLAVIX) 75 MG tablet Take 75 mg by mouth daily.       No current facility-administered medications on file prior to visit.     REVIEW OF SYSTEMS: Please see history of present illness, otherwise all systems are negative.  PHYSICAL EXAMINATION:   Vital signs are BP 114/72  Pulse 73  Ht 5\' 4"  (1.626 m)  Wt 157 lb 9.6 oz (71.487 kg)  BMI 27.04 kg/m2  SpO2 100% General: The patient appears their stated age. HEENT:  No gross abnormalities Pulmonary:  Non labored breathing Musculoskeletal: There are no major deformities. Neurologic: No focal weakness or paresthesias are detected, Skin: There are no ulcer or rashes noted. Psychiatric: The patient has normal affect. Cardiovascular: There is a regular rate and rhythm without significant murmur appreciated. Bilateral carotid and supraclavicular bruit. Palpable right radial pulse. The left radial pulse is not palpable   Diagnostic Studies none today  Assessment: Severe vascular disease, status post left carotid endarterectomy and aortobifemoral bypass graft Subclavian steel syndrome Plan: The patient will be scheduled for an ultrasound in 6 months Of Bilateral Carotid Arteries. Her most recent study revealed 60-79% right-sided carotid stenosis. The left side was in the 40-59% range.  Subclavian steel: I feel that the patient's symptoms of dizziness and amaurosis fugax are related to subclavian steal syndrome. I have gone back and reviewed her old CT scans, including her scans done to rule out pulmonary embolism. There appears to be a high-grade left subclavian artery stenosis at its ostium. This was certainly  explain her symptoms. In addition, she has been seen and evaluated by neurology who feels that this is most likely the diagnosis. In order to lower her risk for stroke or some other posterior circulation complication, I have recommended that she undergo aortic arch angiography and selective evaluation of bilateral subclavian arteries. If she is a candidate for stenting of her left subclavian artery lesion, I would proceed at that time. This is been scheduled for Wednesday, June 18  V. Charlena Cross, M.D. [facility] [place] Office: [phone] Pager:  [phone]

## 2012-06-24 NOTE — Op Note (Signed)
[facility] [place]  Patient name: Kimberly [name] MRN: [id number] DOB: 1950-08-28 Sex: female  06/24/2012 Pre-operative Diagnosis: Subclavian steal syndrome Post-operative diagnosis:  Same Surgeon:  Jorge Ny Procedure Performed:  1.  ultrasound-guided access, right femoral artery  2.  aortic arch angiogram  3.  left upper extremity runoff  4.  left brachial artery access with ultrasound-guidance  5.  stent, left subclavian artery     Indications:  The patient has suffered from multiple vascular issues. Recently she has been diagnosed with subclavian steal syndrome. She comes in today for further evaluation and possible treatment of a left subclavian artery stenosis versus occlusion.  Procedure:  The patient was identified in the holding area and taken to room 8.  The patient was then placed supine on the table and prepped and draped in the usual sterile fashion.  A time out was called.  Ultrasound was used to evaluate the right common femoral artery.  It was patent .  A digital ultrasound image was acquired.  A micropuncture needle was used to access the right common femoral artery under ultrasound guidance.  An 018 wire was advanced without resistance and a micropuncture sheath was placed.  The 018 wire was removed and a benson wire was placed.  The micropuncture sheath was exchanged for a 5 french sheath.  A pigtail catheter was advanced into the a sitting aorta and an aortic arch antegrade was  Findings:   Aortic arch:  A type I aortic arch is identified. There is approximately 60% stenosis at the ostium of the innominate artery. The right subclavian artery is widely patent. There is a dominant right vertebral artery. Bifurcation stenosis is identified within the right carotid artery. The left common carotid artery is widely patent. What could be visualized on the left internal carotid artery appears to be widely patent with patulous dilatation consistent  with previous operation. The ostium of the left subclavian artery is occluded with reconstitution from retrograde flow down the left vertebral artery. The axillary and proximal brachial artery widely patent.    Intervention:  After the above images were acquired, the decision was made to proceed with intervention. I used a Berenstein 2 catheter and a Glidewire to gain access into the ostium of the occluded left subclavian artery. I tried to cross the occlusion but did not have enough support. I therefore elected to approach this from the left brachial artery. After prepping and draping the left arm I evaluated the left brachial artery with ultrasound. It was patent. No significant calcification was identified. A digital ultrasound image was acquired. The left brachial artery was then cannulated under ultrasound guidance with a micropuncture needle. An 018 wire was advanced without resistance and a micropuncture sheath was placed. Then, over a Benson wire a 5 French sheath was placed. 200 mcg of nitroglycerin and 2000 units of heparin were administered. Next, using a Kumpe catheter and a Glidewire I crossed the occlusion in the proximal subclavian artery. A contrast injection was performed in the aorta, confirming successful crossing. A Rosen wire was next, I upsized to a 6 [place] bright tip sheath. The patient was then given an additional 5000 units of heparin. I elected to primarily stented this lesion. A 7 x 19 Omni Link balloon expandable stentthen placed. followup arteriogram revealed resolution of the occlusion and widely patent left subclavian artery and vertebral artery filling antegrade. An additional 200 mcg of nitroglycerin were administered. I exchanged the long sheath for a  short 6 French sheath. Wires were removed. There were no complications. The patient taken the holding area for sheath removal once her coagulation profile corrects.  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 Durene Cal, M.D. [facility] [place] Office: [phone] Pager:  [phone]

## 2012-06-24 NOTE — Interval H&P Note (Signed)
History and Physical Interval Note:  06/24/2012 9:52 AM  Kimberly [name]  has presented today for surgery, with the diagnosis of subclavion ss  The various methods of treatment have been discussed with the patient and family. After consideration of risks, benefits and other options for treatment, the patient has consented to  Procedure(s): ARCH AORTOGRAM (N/A) as a surgical intervention .  The patient's history has been reviewed, patient examined, no change in status, stable for surgery.  I have reviewed the patient's chart and labs.  Questions were answered to the patient's satisfaction.     BRABHAM IV, V. WELLS

## 2012-06-24 NOTE — Telephone Encounter (Addendum)
Message copied by Rosalyn Charters on Wed Jun 24, 2012  4:33 PM ------      Message from: Melene Plan      Created: Wed Jun 24, 2012  4:09 PM       Good luck      ----- Message -----         From: Fransisco Hertz, MD         Sent: 06/24/2012   4:02 PM           To: Reuel Derby, Melene Plan, RN            Kimberly [name]      [id number]      07-Nov-1950            Pt needs follow up with Dr. Myra Gianotti this Monday for brachial hematoma related to her recent procedure       ------  notified patient of fu appt. on 06-29-12 1:00 with dr. Myra Gianotti

## 2012-06-24 NOTE — Telephone Encounter (Addendum)
Message copied by Rosalyn Charters on Wed Jun 24, 2012  2:14 PM ------      Message from: Melene Plan      Created: Wed Jun 24, 2012 12:08 PM       HAVE A HEADACHE SO CANT REMEMBER IF SENT YOU THIS      ----- Message -----         From: Nada Libman, MD         Sent: 06/24/2012  11:39 AM           To: Reuel Derby, Melene Plan, RN            06/24/2012:            Surgeon:  Jorge Ny      Procedure Performed:       1.  ultrasound-guided access, right femoral artery       2.  aortic arch angiogram       3.  left upper extremity runoff       4.  left brachial artery access with ultrasound-guidance       5.  stent, left subclavian artery                   Please schedule the patient to followup and one month with a carotid and subclavian duplex ultrasound    notified patient of fu appt. on 08-03-12 2pm      ------

## 2012-06-24 NOTE — Progress Notes (Signed)
CONTINUED C/O 10/10 LEFT ARM PAIN; BOTH LEFT AND RIGHT ARMS MEASURE 11.5IN AT BICEPS; NO C/O NUMBNESS OR PAIN IN LEFT HAND; LEFT RADIAL PULSE  3+; COLOR LEFT HAND PINK AND LEFT HAND WARM; CAPILLARY REFILL < 3SEC; DR LAWSON NOTIFIED OF COMPLAINTS AND HOW LEFT ARM LOOKS AND PER DR LAWSON OK TO D/C HOME; CLIENT ADVISED TO CALL OFFICE OR RETURN TO ER IF HAND GETS NUMB OR PAINFUL AND VOICED UNDERSTANDING; AND ADVISED TO CALL DR BRABHAM'S OFFICE IF CONT PAIN IN ARM AND VOICED UNDERSTANDING

## 2012-06-26 ENCOUNTER — Encounter: Payer: Self-pay | Admitting: Surgery

## 2012-06-29 ENCOUNTER — Ambulatory Visit (INDEPENDENT_AMBULATORY_CARE_PROVIDER_SITE_OTHER): Payer: Medicaid Other | Admitting: Surgery

## 2012-06-29 ENCOUNTER — Encounter: Payer: Self-pay | Admitting: Surgery

## 2012-06-29 VITALS — BP 137/73 | HR 79 | Temp 97.1°F | Resp 16 | Ht 63.0 in | Wt 155.0 lb

## 2012-06-29 DIAGNOSIS — R5381 Other malaise: Secondary | ICD-10-CM

## 2012-06-29 DIAGNOSIS — R5383 Other fatigue: Secondary | ICD-10-CM

## 2012-06-29 DIAGNOSIS — R531 Weakness: Secondary | ICD-10-CM

## 2012-06-29 DIAGNOSIS — M79609 Pain in unspecified limb: Secondary | ICD-10-CM | POA: Insufficient documentation

## 2012-06-29 NOTE — Progress Notes (Signed)
[facility] [place]   Patient name: Kimberly [name] MRN: [id number] DOB: 15-Jul-1950 Sex: female     Chief Complaint  Patient presents with  . Routine Post Op    5 day F/up Left angiogram, Left stent intervention, Left subclavian C/O low energy, left neck,  shoulder and arm hurts    HISTORY OF PRESENT ILLNESS: The patient is here today for followup. She is recently status post stenting of her left subclavian artery. This was done on 06/24/2012 in the setting of a symptomatic subclavian steal syndrome. She was discharged to home the same day. She has been complaining of left shoulder pain as well as pain around her left brachial access site. She continues to be without a significant amount of energy. She has not been getting up very much. She has no neurologic symptoms and has not had a passing out or dizzy spell.  Past Medical History  Diagnosis Date  . Asthma   . Hyperlipidemia   . History of lead poisoning 1977  . Shortness of breath   . Peripheral vascular disease     claudication  right greater than left  femoral artery  . Anxiety   . Blood transfusion   . Migraine   . Subclavian artery disease     left  . Small bowel obstruction, partial 04/08/11  . Angina     March 2013  . Depression   . Uterine cancer     Pre cervical  . Fibromyalgia     Past Surgical History  Procedure Laterality Date  . Abdominal angiogram    . Aorta - bilateral femoral artery bypass graft  02/26/2011    Procedure: AORTA BIFEMORAL BYPASS GRAFT;  Surgeon: Juleen China, MD;  Location: [facility] OR;  Service: Vascular;  Laterality: N/A;  . Pr vein bypass graft,aorto-fem-pop  02/26/11  . Laparotomy  04/10/2011    Procedure: EXPLORATORY LAPAROTOMY;  Surgeon: Kandis Cocking, MD;  Location: [facility] OR;  Service: General;  Laterality: N/A;  ENTEROLYSIS OF ADHESIONS  . Abdominal hysterectomy    . Endarterectomy  06/13/2011    Procedure: ENDARTERECTOMY CAROTID;  Surgeon: Nada Libman, MD;   Location: [facility] OR;  Service: Vascular;  Laterality: Left;  Left carotid artery endarterectomy with vascu-guard patch angioplasty  . Carotid endarterectomy  06/13/2011    Left  CEA  . Lower extremity angiogram Left 06/24/12    Left stent intervention, left subclavian    History   Social History  . Marital Status: Legally Separated    Spouse Name: N/A    Number of Children: N/A  . Years of Education: N/A   Occupational History  . Not on file.   Social History Main Topics  . Smoking status: Current Some Day Smoker -- 0.05 packs/day for 45 years    Types: Cigarettes    Last Attempt to Quit: 02/24/2011  . Smokeless tobacco: Never Used     Comment: pt states that she smokes about 6 cigs per day  . Alcohol Use: No  . Drug Use: No     Comment: History of Cocaine and marijuana abuse: "not in many years" (04/08/11  . Sexually Active: Not Currently   Other Topics Concern  . Not on file   Social History Narrative   Pt lives at home with her 2 grandsons. She has been separated for 94yrs, has two children, and has a 12th grade education level. She drinks 3 cups of coffee daily.    Family History  Problem Relation Age of Onset  . Cancer Mother   . Anesthesia problems Neg Hx   . Cancer Brother   . Cancer Other   . Stroke Other     Allergies as of 06/29/2012 - Review Complete 06/29/2012  Allergen Reaction Noted  . Other Shortness Of Breath 06/11/2011  . Codeine Nausea Only 05/02/2011    Current Outpatient Prescriptions on File Prior to Visit  Medication Sig Dispense Refill  . acetaminophen (TYLENOL) 325 MG tablet Take 650 mg by mouth every 6 (six) hours as needed for pain.      Marland Kitchen aspirin EC 81 MG tablet Take 162 mg by mouth daily.       . clopidogrel (PLAVIX) 75 MG tablet Take 75 mg by mouth daily.      Marland Kitchen ibuprofen (ADVIL,MOTRIN) 200 MG tablet Take 400-600 mg by mouth every 6 (six) hours as needed for pain.       . Multiple Vitamin (MULITIVITAMIN WITH MINERALS) TABS Take 1 tablet by  mouth daily.      . polyethylene glycol (MIRALAX / GLYCOLAX) packet Take 17 g by mouth daily as needed (constipation).       . traMADol (ULTRAM) 50 MG tablet Take 100 mg by mouth every 8 (eight) hours as needed for pain.        No current facility-administered medications on file prior to visit.     REVIEW OF SYSTEMS: Please see history of present illness, otherwise systems are unchanged from prior visit  PHYSICAL EXAMINATION:   Vital signs are BP 137/73  Pulse 79  Temp(Src) 97.1 F (36.2 C) (Oral)  Resp 16  Ht 5\' 3"  (1.6 m)  Wt 155 lb (70.308 kg)  BMI 27.46 kg/m2  SpO2 97% General: The patient appears their stated age. HEENT:  No gross abnormalities Pulmonary:  Non labored breathing Musculoskeletal: There are no major deformities. Neurologic: No focal weakness or paresthesias are detected, Skin: There are no ulcer or rashes noted. Psychiatric: The patient has normal affect. Cardiovascular: There is a regular rate and rhythm without significant murmur appreciated. Palpable left and right radial pulse tender area on the medial antecubital crease on the left where her brachial access site was. No bruit   Diagnostic Studies None  Assessment: Status post left subclavian artery stent for subclavian steel Plan: The patient will followup in 6 months with a carotid ultrasound as well as lower extremity ankle-brachial indices to evaluate her aortobifemoral bypass graft. She was given a prescription for 3 months worth of Plavix for today. Hopefully with stenting of her occluded left subclavian artery, she will have resolution of her symptoms consistent with dizziness and presyncope. She will contact me and she has any further problems, otherwise I will see her in 6 months  V. Charlena Cross, M.D. [facility] [place] Office: [phone] Pager:  [phone]

## 2012-06-30 ENCOUNTER — Other Ambulatory Visit: Payer: Self-pay | Admitting: *Deleted

## 2012-06-30 DIAGNOSIS — I739 Peripheral vascular disease, unspecified: Secondary | ICD-10-CM

## 2012-06-30 DIAGNOSIS — Z48812 Encounter for surgical aftercare following surgery on the circulatory system: Secondary | ICD-10-CM

## 2012-08-03 ENCOUNTER — Other Ambulatory Visit: Payer: Medicaid Other

## 2012-08-03 ENCOUNTER — Ambulatory Visit: Payer: Medicaid Other | Admitting: Surgery

## 2012-12-04 ENCOUNTER — Other Ambulatory Visit: Payer: Self-pay

## 2012-12-04 ENCOUNTER — Encounter (HOSPITAL_COMMUNITY): Payer: Self-pay | Admitting: Emergency Medicine

## 2012-12-04 ENCOUNTER — Inpatient Hospital Stay (HOSPITAL_COMMUNITY)
Admission: EM | Admit: 2012-12-04 | Discharge: 2012-12-10 | DRG: 419 | Disposition: A | Discharge status: Home / Self Care | Payer: Medicaid Other | Attending: General Surgery | Admitting: General Surgery

## 2012-12-04 ENCOUNTER — Emergency Department (HOSPITAL_COMMUNITY): Payer: Medicaid Other

## 2012-12-04 DIAGNOSIS — K8010 Calculus of gallbladder with chronic cholecystitis without obstruction: Principal | ICD-10-CM | POA: Diagnosis present

## 2012-12-04 DIAGNOSIS — K660 Peritoneal adhesions (postprocedural) (postinfection): Secondary | ICD-10-CM | POA: Diagnosis present

## 2012-12-04 DIAGNOSIS — I739 Peripheral vascular disease, unspecified: Secondary | ICD-10-CM | POA: Diagnosis present

## 2012-12-04 DIAGNOSIS — IMO0001 Reserved for inherently not codable concepts without codable children: Secondary | ICD-10-CM | POA: Diagnosis present

## 2012-12-04 DIAGNOSIS — F17200 Nicotine dependence, unspecified, uncomplicated: Secondary | ICD-10-CM | POA: Diagnosis present

## 2012-12-04 DIAGNOSIS — K810 Acute cholecystitis: Secondary | ICD-10-CM | POA: Diagnosis present

## 2012-12-04 DIAGNOSIS — Z8542 Personal history of malignant neoplasm of other parts of uterus: Secondary | ICD-10-CM

## 2012-12-04 DIAGNOSIS — E785 Hyperlipidemia, unspecified: Secondary | ICD-10-CM | POA: Diagnosis present

## 2012-12-04 LAB — URINALYSIS, ROUTINE W REFLEX MICROSCOPIC
Bilirubin Urine: NEGATIVE
Glucose, UA: NEGATIVE mg/dL
Ketones, ur: NEGATIVE mg/dL
Leukocytes, UA: NEGATIVE
Nitrite: POSITIVE — AB
Protein, ur: NEGATIVE mg/dL
Specific Gravity, Urine: 1.01 (ref 1.005–1.030)
Urobilinogen, UA: 0.2 mg/dL (ref 0.0–1.0)
pH: 5 (ref 5.0–8.0)

## 2012-12-04 LAB — HEPATIC FUNCTION PANEL
ALT: 10 U/L (ref 0–35)
AST: 17 U/L (ref 0–37)
Albumin: 2.9 g/dL — ABNORMAL LOW (ref 3.5–5.2)
Alkaline Phosphatase: 81 U/L (ref 39–117)
Bilirubin, Direct: 0.2 mg/dL (ref 0.0–0.3)
Indirect Bilirubin: 0 mg/dL — ABNORMAL LOW (ref 0.3–0.9)
Total Bilirubin: 0.2 mg/dL — ABNORMAL LOW (ref 0.3–1.2)
Total Protein: 5.7 g/dL — ABNORMAL LOW (ref 6.0–8.3)

## 2012-12-04 LAB — CBC
HCT: 41 % (ref 36.0–46.0)
Hemoglobin: 14.5 g/dL (ref 12.0–15.0)
MCH: 34 pg (ref 26.0–34.0)
MCHC: 35.4 g/dL (ref 30.0–36.0)
MCV: 96 fL (ref 78.0–100.0)
Platelets: 238 10*3/uL (ref 150–400)
RBC: 4.27 MIL/uL (ref 3.87–5.11)
RDW: 13.3 % (ref 11.5–15.5)
WBC: 12.2 10*3/uL — ABNORMAL HIGH (ref 4.0–10.5)

## 2012-12-04 LAB — LIPASE, BLOOD: Lipase: 32 U/L (ref 11–59)

## 2012-12-04 LAB — BASIC METABOLIC PANEL
BUN: 18 mg/dL (ref 6–23)
CO2: 23 mEq/L (ref 19–32)
Calcium: 9.2 mg/dL (ref 8.4–10.5)
Chloride: 101 mEq/L (ref 96–112)
Creatinine, Ser: 1.16 mg/dL — ABNORMAL HIGH (ref 0.50–1.10)
GFR calc Af Amer: 58 mL/min — ABNORMAL LOW (ref >90)
GFR calc non Af Amer: 50 mL/min — ABNORMAL LOW (ref >90)
Glucose, Bld: 139 mg/dL — ABNORMAL HIGH (ref 70–99)
Potassium: 4.2 mEq/L (ref 3.5–5.1)
Sodium: 133 mEq/L — ABNORMAL LOW (ref 135–145)

## 2012-12-04 LAB — PRO B NATRIURETIC PEPTIDE: Pro B Natriuretic peptide (BNP): 46.9 pg/mL (ref 0–125)

## 2012-12-04 LAB — POCT I-STAT TROPONIN I: Troponin i, poc: 0 ng/mL (ref 0.00–0.08)

## 2012-12-04 LAB — URINE MICROSCOPIC-ADD ON

## 2012-12-04 LAB — TROPONIN I: Troponin I: <0.3 ng/mL (ref <0.30)

## 2012-12-04 MED ORDER — HYDROMORPHONE HCL PF 1 MG/ML IJ SOLN
0.5000 mg | Freq: Once | INTRAMUSCULAR | Status: AC
  Administered 2012-12-04: 0.5 mg via INTRAVENOUS
  Filled 2012-12-04: qty 1

## 2012-12-04 MED ORDER — HYDROMORPHONE HCL PF 1 MG/ML IJ SOLN
1.0000 mg | Freq: Once | INTRAMUSCULAR | Status: AC
  Administered 2012-12-04: 1 mg via INTRAVENOUS
  Filled 2012-12-04: qty 1

## 2012-12-04 MED ORDER — SODIUM CHLORIDE 0.9 % IV BOLUS (SEPSIS)
1000.0000 mL | Freq: Once | INTRAVENOUS | Status: AC
  Administered 2012-12-04: 1000 mL via INTRAVENOUS

## 2012-12-04 MED ORDER — NITROGLYCERIN 0.4 MG SL SUBL
0.4000 mg | SUBLINGUAL_TABLET | SUBLINGUAL | Status: DC | PRN
  Administered 2012-12-04: 0.4 mg via SUBLINGUAL

## 2012-12-04 MED ORDER — IOHEXOL 350 MG/ML SOLN
100.0000 mL | Freq: Once | INTRAVENOUS | Status: AC | PRN
  Administered 2012-12-04: 100 mL via INTRAVENOUS

## 2012-12-04 MED ORDER — NITROGLYCERIN 0.4 MG SL SUBL
SUBLINGUAL_TABLET | SUBLINGUAL | Status: AC
  Filled 2012-12-04: qty 25

## 2012-12-04 NOTE — ED Notes (Signed)
Pt sleeping, NAD, calm, VSS, pt to [place].

## 2012-12-04 NOTE — ED Provider Notes (Addendum)
CSN: [id number]     Arrival date & time 12/04/12  1839 History   First MD Initiated Contact with Patient 12/04/12 1852     Chief Complaint  Patient presents with  . Chest Pain   (Consider location/radiation/quality/duration/timing/severity/associated sxs/prior Treatment) HPI Comments: 62 yo female with vascular disease, bypass abdomen, subclavian/ carotid vascular disease presents with right sided back pain radiating to anterior chest on right, started 45 min prior to exam, no hx of similar.  Pt on plavix.  Lipids hx, angina hx.  Smoker.  Patient is a 62 y.o. female presenting with chest pain. The history is provided by the patient.  Chest Pain Associated symptoms: abdominal pain (epig chronic) and back pain   Associated symptoms: no cough, no fever, no headache, no shortness of breath and not vomiting     Past Medical History  Diagnosis Date  . Asthma   . Hyperlipidemia   . History of lead poisoning 1977  . Shortness of breath   . Peripheral vascular disease     claudication  right greater than left  femoral artery  . Anxiety   . Blood transfusion   . Migraine   . Subclavian artery disease     left  . Small bowel obstruction, partial 04/08/11  . Angina     March 2013  . Depression   . Uterine cancer     Pre cervical  . Fibromyalgia    Past Surgical History  Procedure Laterality Date  . Abdominal angiogram    . Aorta - bilateral femoral artery bypass graft  02/26/2011    Procedure: AORTA BIFEMORAL BYPASS GRAFT;  Surgeon: Juleen China, MD;  Location: [facility] OR;  Service: Vascular;  Laterality: N/A;  . Pr vein bypass graft,aorto-fem-pop  02/26/11  . Laparotomy  04/10/2011    Procedure: EXPLORATORY LAPAROTOMY;  Surgeon: Kandis Cocking, MD;  Location: [facility] OR;  Service: General;  Laterality: N/A;  ENTEROLYSIS OF ADHESIONS  . Abdominal hysterectomy    . Endarterectomy  06/13/2011    Procedure: ENDARTERECTOMY CAROTID;  Surgeon: Nada Libman, MD;  Location: [facility] OR;  Service: Vascular;   Laterality: Left;  Left carotid artery endarterectomy with vascu-guard patch angioplasty  . Carotid endarterectomy  06/13/2011    Left  CEA  . Lower extremity angiogram Left 06/24/12    Left stent intervention, left subclavian   Family History  Problem Relation Age of Onset  . Cancer Mother   . Anesthesia problems Neg Hx   . Cancer Brother   . Cancer Other   . Stroke Other    History  Substance Use Topics  . Smoking status: Current Some Day Smoker -- 0.05 packs/day for 45 years    Types: Cigarettes    Last Attempt to Quit: 02/24/2011  . Smokeless tobacco: Never Used     Comment: pt states that she smokes about 6 cigs per day  . Alcohol Use: No   OB History   Grav Para Term Preterm Abortions TAB SAB Ect Mult Living                 Review of Systems  Constitutional: Negative for fever and chills.  HENT: Negative for congestion.   Eyes: Negative for visual disturbance.  Respiratory: Negative for cough and shortness of breath.   Cardiovascular: Positive for chest pain. Negative for leg swelling.  Gastrointestinal: Positive for abdominal pain (epig chronic). Negative for vomiting.  Genitourinary: Negative for dysuria and flank pain.  Musculoskeletal: Positive for back pain. Negative  for neck pain and neck stiffness.  Skin: Negative for rash.  Neurological: Negative for light-headedness and headaches.    Allergies  Other and Codeine  Home Medications   Current Outpatient Rx  Name  Route  Sig  Dispense  Refill  . acetaminophen (TYLENOL) 325 MG tablet   Oral   Take 650 mg by mouth every 6 (six) hours as needed for pain.         Marland Kitchen aspirin EC 81 MG tablet   Oral   Take 162 mg by mouth daily.          . clopidogrel (PLAVIX) 75 MG tablet   Oral   Take 75 mg by mouth daily.         Marland Kitchen ibuprofen (ADVIL,MOTRIN) 200 MG tablet   Oral   Take 400-600 mg by mouth every 6 (six) hours as needed for pain.          . Multiple Vitamin (MULITIVITAMIN WITH MINERALS) TABS    Oral   Take 1 tablet by mouth daily.         . polyethylene glycol (MIRALAX / GLYCOLAX) packet   Oral   Take 17 g by mouth daily as needed (constipation).          . traMADol (ULTRAM) 50 MG tablet   Oral   Take 100 mg by mouth every 8 (eight) hours as needed for pain.           BP 181/70  Pulse 82  Temp(Src) 98.3 F (36.8 C) (Oral)  Resp 18  Ht 5\' 4"  (1.626 m)  SpO2 98% Physical Exam  Nursing note and vitals reviewed. Constitutional: She is oriented to person, place, and time. She appears well-developed and well-nourished.  HENT:  Head: Normocephalic and atraumatic.  Eyes: Conjunctivae are normal. Right eye exhibits no discharge. Left eye exhibits no discharge.  Neck: Normal range of motion. Neck supple. No tracheal deviation present.  Cardiovascular: Normal rate and regular rhythm.   Pulmonary/Chest: Effort normal and breath sounds normal.  Abdominal: Soft. She exhibits no distension. There is tenderness (mild epigastric). There is no guarding.  Musculoskeletal: She exhibits tenderness (right paravertebral thoracic, tight musculature). She exhibits no edema.  Neurological: She is alert and oriented to person, place, and time.  Skin: Skin is warm. No rash noted.  Psychiatric: She has a normal mood and affect.    ED Course  Procedures (including critical care time)  EMERGENCY DEPARTMENT BILIARY ULTRASOUND INTERPRETATION "Study: Limited Abdominal Ultrasound of the gallbladder and common bile duct."  INDICATIONS: Abdominal pain Indication: Multiple views of the gallbladder and common bile duct were obtained in real-time with a Multi-frequency probe." PERFORMED BY:  Myself IMAGES ARCHIVED?: Yes FINDINGS: Gallstones present, Gallbladder wall normal in thickness, Sonographic Murphy's sign absent and Common bile duct enlarged LIMITATIONS: Body Habitus INTERPRETATION: Cholelithiasis   Labs Review Labs Reviewed  CBC - Abnormal; Notable for the following:    WBC 12.2  (*)    All other components within normal limits  BASIC METABOLIC PANEL - Abnormal; Notable for the following:    Sodium 133 (*)    Glucose, Bld 139 (*)    Creatinine, Ser 1.16 (*)    GFR calc non Af Amer 50 (*)    GFR calc Af Amer 58 (*)    All other components within normal limits  HEPATIC FUNCTION PANEL - Abnormal; Notable for the following:    Total Protein 5.7 (*)    Albumin 2.9 (*)  Total Bilirubin 0.2 (*)    Indirect Bilirubin 0.0 (*)    All other components within normal limits  URINALYSIS, ROUTINE W REFLEX MICROSCOPIC - Abnormal; Notable for the following:    Hgb urine dipstick LARGE (*)    Nitrite POSITIVE (*)    All other components within normal limits  URINE MICROSCOPIC-ADD ON - Abnormal; Notable for the following:    Squamous Epithelial / LPF MANY (*)    Bacteria, UA FEW (*)    All other components within normal limits  PRO B NATRIURETIC PEPTIDE  TROPONIN I  LIPASE, BLOOD  POCT I-STAT TROPONIN I   Imaging Review Dg Chest Port 1 View  12/04/2012   CLINICAL DATA:  Chest pain and short of breath  EXAM: PORTABLE CHEST - 1 VIEW  COMPARISON:  04/10/2011  FINDINGS: Lungs are under aerated and clear. Normal heart size. No pneumothorax.  IMPRESSION: No active disease.   Electronically Signed   By: Maryclare Bean M.D.   On: 12/04/2012 19:17   Ct Angio Chest Aortic Dissect W &/or W/o  12/04/2012   CLINICAL DATA:  Chest pain  EXAM: CT ANGIOGRAPHY CHEST, ABDOMEN AND PELVIS  TECHNIQUE: Multidetector CT imaging through the chest, abdomen and pelvis was performed using the standard protocol during bolus administration of intravenous contrast. Multiplanar reconstructed images including MIPs were obtained and reviewed to evaluate the vascular anatomy.  CONTRAST:  OMNIPAQUE IOHEXOL 350 MG/ML SOLN  COMPARISON:  None.  FINDINGS: CTA CHEST FINDINGS  No evidence of intramural hematoma. No evidence of aortic dissection or aneurysm.  Innominate artery, right subclavian artery, right  vertebral artery, right common carotid artery, left common carotid artery are patent. The origin of the left subclavian artery is stented and grossly patent. The vertebral artery is patent.  No filling defect in the pulmonary arterial tree to suggest acute pulmonary thromboembolism.  No abnormal mediastinal adenopathy.  No pericardial effusion.  No pneumothorax.  No pleural effusion  Dependent atelectasis at both posterior lungs.  Review of the MIP images confirms the above findings.  CTA ABDOMEN AND PELVIS FINDINGS  There is no evidence of aortic dissection. Aortobifemoral bypass graft is stable. Proximal anastomosis is widely patent. There is narrowing at the distal anastomosis to the right common femoral artery which is stable. Left distal anastomosis is widely patent.  50% narrowing at the origin of the celiac axis. Branch vessels are patent.  50% narrowing in the proximal SMA. There is post stenotic dilatation. Branch vessels are grossly patent.  There is severe narrowing at the origin of the right renal artery. Left renal artery is patent.  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 The native internal and external iliac arteries reconstitute.  There is noted to be mild gallbladder wall thickening. There is fluid density adjacent to the neck of the gallbladder. The common bile duct is suspected to be dilated.  Spleen, pancreas, adrenal glands, and kidneys are within normal limits. Pancreatic duct is upper normal in caliber at 4 mm.  Bladder is unremarkable. Uterus is absent. Adnexa are within normal limits.  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  No acute bony deformity.  No free-fluid in the pelvis. No abnormal retroperitoneal adenopathy.  Review of the MIP images confirms the above findings.  IMPRESSION: No evidence of aortic dissection. No acute intra thoracic pathology  Aortobifemoral bypass graft as described. There is mild narrowing of the distal anastomosis to the right common  femoral artery which is stable.  Narrowing of the  celiac axis and SMA as described.  Significant narrowing at the origin of the right renal artery.  There is nonspecific wall thickening of the gallbladder with pericholecystic fluid. The common bile duct is also suspected to be dilated. Ultrasound may be helpful to further characterize. No definite gallstones are seen.   Electronically Signed   By: Maryclare Bean M.D.   On: 12/04/2012 20:42   Ct Angio Abd/pel W/ And/or W/o  12/04/2012   CLINICAL DATA:  Chest pain  EXAM: CT ANGIOGRAPHY CHEST, ABDOMEN AND PELVIS  TECHNIQUE: Multidetector CT imaging through the chest, abdomen and pelvis was performed using the standard protocol during bolus administration of intravenous contrast. Multiplanar reconstructed images including MIPs were obtained and reviewed to evaluate the vascular anatomy.  CONTRAST:  OMNIPAQUE IOHEXOL 350 MG/ML SOLN  COMPARISON:  None.  FINDINGS: CTA CHEST FINDINGS  No evidence of intramural hematoma. No evidence of aortic dissection or aneurysm.  Innominate artery, right subclavian artery, right vertebral artery, right common carotid artery, left common carotid artery are patent. The origin of the left subclavian artery is stented and grossly patent. The vertebral artery is patent.  No filling defect in the pulmonary arterial tree to suggest acute pulmonary thromboembolism.  No abnormal mediastinal adenopathy.  No pericardial effusion.  No pneumothorax.  No pleural effusion  Dependent atelectasis at both posterior lungs.  Review of the MIP images confirms the above findings.  CTA ABDOMEN AND PELVIS FINDINGS  There is no evidence of aortic dissection. Aortobifemoral bypass graft is stable. Proximal anastomosis is widely patent. There is narrowing at the distal anastomosis to the right common femoral artery which is stable. Left distal anastomosis is widely patent.  50% narrowing at the origin of the celiac axis. Branch vessels are patent.  50%  narrowing in the proximal SMA. There is post stenotic dilatation. Branch vessels are grossly patent.  There is severe narrowing at the origin of the right renal artery. Left renal artery is patent.  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 The native internal and external iliac arteries reconstitute.  There is noted to be mild gallbladder wall thickening. There is fluid density adjacent to the neck of the gallbladder. The common bile duct is suspected to be dilated.  Spleen, pancreas, adrenal glands, and kidneys are within normal limits. Pancreatic duct is upper normal in caliber at 4 mm.  Bladder is unremarkable. Uterus is absent. Adnexa are within normal limits.  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  No acute bony deformity.  No free-fluid in the pelvis. No abnormal retroperitoneal adenopathy.  Review of the MIP images confirms the above findings.  IMPRESSION: No evidence of aortic dissection. No acute intra thoracic pathology  Aortobifemoral bypass graft as described. There is mild narrowing of the distal anastomosis to the right common femoral artery which is stable.  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  Significant narrowing at the origin of the right renal artery.  There is nonspecific wall thickening of the gallbladder with pericholecystic fluid. The common bile duct is also suspected to be dilated. Ultrasound may be helpful to further characterize. No definite gallstones are seen.   Electronically Signed   By: Maryclare Bean M.D.   On: 12/04/2012 20:42    EKG Interpretation    Date/Time:  Friday December 04 2012 18:43:27 EST Ventricular Rate:  74 PR Interval:  126 QRS Duration: 92 QT Interval:  374 QTC Calculation: 415 R Axis:   48 Text Interpretation:  Normal sinus rhythm Incomplete right bundle branch block Borderline ECG Poor baseline, overall simialr to previous Confirmed by Jerric Oyen  MD, Karmine Kauer ([id number]) on 12/04/2012 6:59:01 PM            MDM   1.  Acute cholecystitis    Concern for MSK vs Kidney/ GB vs atypical CAD vs dissection. Right sided pain, reproducable, delta troponin neg.  Plan for pain meds, CT dissection, delta troponins. CT no dissection however showed GB thickening/ fluid- recheck pt does have tenderness RUQ under lower ribs. Spoke with g surgery on call, recommended [place] then call with results.    Pt pain improved on recheck.  [place] pending. With WBC elevated, RUQ pain and abnormal CT pt will need to be evaluated by g surgery. Signed out to call G surgery with results.   Right chest pain, Right upper abdo pain, HTN      Enid Skeens, MD 12/05/12 1914  Enid Skeens, MD 12/16/12 [phone]

## 2012-12-04 NOTE — ED Notes (Signed)
Pt reports sudden onset of mid-sternum chest pain radiating straight through to her back and SOB. Pt reports a cardiac hx

## 2012-12-05 DIAGNOSIS — K8020 Calculus of gallbladder without cholecystitis without obstruction: Secondary | ICD-10-CM

## 2012-12-05 DIAGNOSIS — R109 Unspecified abdominal pain: Secondary | ICD-10-CM

## 2012-12-05 DIAGNOSIS — K810 Acute cholecystitis: Secondary | ICD-10-CM | POA: Diagnosis present

## 2012-12-05 LAB — COMPREHENSIVE METABOLIC PANEL
ALT: 9 U/L (ref 0–35)
AST: 17 U/L (ref 0–37)
Albumin: 3 g/dL — ABNORMAL LOW (ref 3.5–5.2)
Alkaline Phosphatase: 88 U/L (ref 39–117)
BUN: 13 mg/dL (ref 6–23)
CO2: 21 mEq/L (ref 19–32)
Calcium: 8.5 mg/dL (ref 8.4–10.5)
Chloride: 105 mEq/L (ref 96–112)
Creatinine, Ser: 0.81 mg/dL (ref 0.50–1.10)
GFR calc Af Amer: 89 mL/min — ABNORMAL LOW (ref >90)
GFR calc non Af Amer: 77 mL/min — ABNORMAL LOW (ref >90)
Glucose, Bld: 84 mg/dL (ref 70–99)
Potassium: 3.8 mEq/L (ref 3.5–5.1)
Sodium: 139 mEq/L (ref 135–145)
Total Bilirubin: 0.2 mg/dL — ABNORMAL LOW (ref 0.3–1.2)
Total Protein: 6 g/dL (ref 6.0–8.3)

## 2012-12-05 LAB — CBC
HCT: 38.9 % (ref 36.0–46.0)
Hemoglobin: 13.3 g/dL (ref 12.0–15.0)
MCH: 33.1 pg (ref 26.0–34.0)
MCHC: 34.2 g/dL (ref 30.0–36.0)
MCV: 96.8 fL (ref 78.0–100.0)
Platelets: 206 10*3/uL (ref 150–400)
RBC: 4.02 MIL/uL (ref 3.87–5.11)
RDW: 13.4 % (ref 11.5–15.5)
WBC: 9.5 10*3/uL (ref 4.0–10.5)

## 2012-12-05 MED ORDER — HYDROMORPHONE HCL PF 1 MG/ML IJ SOLN
0.5000 mg | INTRAMUSCULAR | Status: DC | PRN
  Administered 2012-12-05 – 2012-12-07 (×3): 0.5 mg via INTRAVENOUS
  Filled 2012-12-05 (×3): qty 1

## 2012-12-05 MED ORDER — OXYCODONE HCL 5 MG PO TABS
5.0000 mg | ORAL_TABLET | ORAL | Status: DC | PRN
  Filled 2012-12-05: qty 1

## 2012-12-05 MED ORDER — TRAMADOL HCL 50 MG PO TABS
100.0000 mg | ORAL_TABLET | Freq: Three times a day (TID) | ORAL | Status: DC | PRN
  Filled 2012-12-05: qty 2

## 2012-12-05 MED ORDER — CEFTRIAXONE SODIUM 1 G IJ SOLR
1.0000 g | Freq: Once | INTRAMUSCULAR | Status: AC
  Administered 2012-12-05: 1 g via INTRAVENOUS
  Filled 2012-12-05: qty 10

## 2012-12-05 MED ORDER — SODIUM CHLORIDE 0.9 % IV SOLN
INTRAVENOUS | Status: DC
  Administered 2012-12-05: 02:00:00 via INTRAVENOUS

## 2012-12-05 MED ORDER — HEPARIN SODIUM (PORCINE) 5000 UNIT/ML IJ SOLN
5000.0000 [IU] | Freq: Three times a day (TID) | INTRAMUSCULAR | Status: AC
  Administered 2012-12-05 – 2012-12-06 (×3): 5000 [IU] via SUBCUTANEOUS
  Filled 2012-12-05 (×7): qty 1

## 2012-12-05 MED ORDER — SODIUM CHLORIDE 0.9 % IV SOLN
3.0000 g | Freq: Four times a day (QID) | INTRAVENOUS | Status: DC
  Administered 2012-12-05 – 2012-12-08 (×13): 3 g via INTRAVENOUS
  Filled 2012-12-05 (×19): qty 3

## 2012-12-05 MED ORDER — ACETAMINOPHEN 325 MG PO TABS
650.0000 mg | ORAL_TABLET | Freq: Four times a day (QID) | ORAL | Status: DC | PRN
  Administered 2012-12-05 – 2012-12-07 (×4): 650 mg via ORAL
  Filled 2012-12-05 (×4): qty 2

## 2012-12-05 MED ORDER — ONDANSETRON HCL 4 MG/2ML IJ SOLN: 4.0000 mg | Freq: Four times a day (QID) | INTRAMUSCULAR | Status: DC | PRN

## 2012-12-05 MED ORDER — POTASSIUM CHLORIDE IN NACL 20-0.9 MEQ/L-% IV SOLN
INTRAVENOUS | Status: DC
  Administered 2012-12-05 – 2012-12-08 (×3): via INTRAVENOUS
  Filled 2012-12-05 (×7): qty 1000

## 2012-12-05 MED ORDER — ACETAMINOPHEN 650 MG RE SUPP: 650.0000 mg | Freq: Four times a day (QID) | RECTAL | Status: DC | PRN

## 2012-12-05 MED ORDER — PANTOPRAZOLE SODIUM 40 MG IV SOLR
40.0000 mg | Freq: Every day | INTRAVENOUS | Status: DC
  Administered 2012-12-05 – 2012-12-09 (×6): 40 mg via INTRAVENOUS
  Filled 2012-12-05 (×7): qty 40

## 2012-12-05 NOTE — Progress Notes (Signed)
Utilization review completed. Adriane Guglielmo, RN, BSN. 

## 2012-12-05 NOTE — ED Notes (Signed)
No changes, pt sleeping, arousable to voice, denies sx or complaints.

## 2012-12-05 NOTE — Progress Notes (Addendum)
Subjective: States abdominal pain much better. Denies nausea. Doesn't want more than clear liquids. Had a headache earlier. A little sleepy right now since she just received some Dilaudid for headache.  WBC normalized to 9.5. Hemoglobin 13.3. LFTs normal. Potassium 3.8. Creatinine 0.81. Glucose 84.  Objective: Vital signs in last 24 hours: Temp:  [97.7 F (36.5 C)-98.3 F (36.8 C)] 97.7 F (36.5 C) (11/29 0331) Pulse Rate:  [60-87] 60 (11/29 0331) Resp:  [13-20] 17 (11/29 0331) BP: (108-181)/(49-70) 140/66 mmHg (11/29 0331) SpO2:  [94 %-99 %] 99 % (11/29 0331) Weight:  [150 lb (68.04 kg)-162 lb 11.2 oz (73.8 kg)] 162 lb 11.2 oz (73.8 kg) (11/29 0331) Last BM Date: 12/04/12  Intake/Output from previous day: 11/28 0701 - 11/29 0700 In: 540 [P.O.:240; I.V.:200; IV Piggyback:100] Out: 500 [Urine:500] Intake/Output this shift: Total I/O In: 120 [P.O.:120] Out: 200 [Urine:200]    General appearance: awake. Answers questions appropriately. In no distress. A little lethargic. GI: abdomen is soft. Not distended. Subjectively a little tender in the right upper quadrant but no guarding. No mass. Midline scar well healed.  Lab Results:   Recent Labs  12/04/12 1859 12/05/12 0610  WBC 12.2* 9.5  HGB 14.5 13.3  HCT 41.0 38.9  PLT 238 206   BMET  Recent Labs  12/04/12 1859 12/05/12 0610  NA 133* 139  K 4.2 3.8  CL 101 105  CO2 23 21  GLUCOSE 139* 84  BUN 18 13  CREATININE 1.16* 0.81  CALCIUM 9.2 8.5   PT/INR No results found for this basename: LABPROT, INR,  in the last 72 hours ABG No results found for this basename: PHART, PCO2, PO2, HCO3,  in the last 72 hours  Studies/Results: US Abdomen Complete  12/05/2012   CLINICAL DATA:  62 year old female with right upper quadrant abdominal pain  EXAM: ULTRASOUND ABDOMEN COMPLETE  COMPARISON:  None.  FINDINGS: Gallbladder:  A few mobile gallstones are noted, largest measuring 5 mm. Upper limits of normal gallbladder  wall thickness is noted measuring 3 mm. The small amount of pericholecystic fluid identified on CT is difficult to visualize, likely due to location. No sonographic Eulah Pont sign is identified.  Common bile duct:  Diameter: 11 mm. No filling defects in the visualized portions of the CBD are noted. Mild fullness of the intrahepatic biliary system is noted.  Liver:  No focal lesion identified. Within normal limits in parenchymal echogenicity.  IVC:  No abnormality visualized.  Pancreas:  Visualized portion unremarkable.  Spleen:  Size and appearance within normal limits.  Right Kidney:  Length: 11.3 cm. Echogenicity within normal limits. No mass or hydronephrosis visualized.  Left Kidney:  Length: 11.6 cm. Echogenicity within normal limits. No mass or hydronephrosis visualized.  Abdominal aorta:  No aneurysm visualized.  Other findings:  There is no evidence of ascites.  IMPRESSION: Cholelithiasis with upper limits of normal gallbladder wall thickness -suspicious for acute cholecystitis. Mild CBD and intrahepatic biliary dilatation.   Electronically Signed   By: Laveda Abbe M.D.   On: 12/05/2012 00:50   Dg Chest Port 1 View  12/04/2012   CLINICAL DATA:  Chest pain and short of breath  EXAM: PORTABLE CHEST - 1 VIEW  COMPARISON:  04/10/2011  FINDINGS: Lungs are under aerated and clear. Normal heart size. No pneumothorax.  IMPRESSION: No active disease.   Electronically Signed   By: Maryclare Bean M.D.   On: 12/04/2012 19:17   Ct Angio Chest Aortic Dissect W &/or W/o  12/04/2012  CLINICAL DATA:  Chest pain  EXAM: CT ANGIOGRAPHY CHEST, ABDOMEN AND PELVIS  TECHNIQUE: Multidetector CT imaging through the chest, abdomen and pelvis was performed using the standard protocol during bolus administration of intravenous contrast. Multiplanar reconstructed images including MIPs were obtained and reviewed to evaluate the vascular anatomy.  CONTRAST:  OMNIPAQUE IOHEXOL 350 MG/ML SOLN  COMPARISON:  None.  FINDINGS: CTA CHEST  FINDINGS  No evidence of intramural hematoma. No evidence of aortic dissection or aneurysm.  Innominate artery, right subclavian artery, right vertebral artery, right common carotid artery, left common carotid artery are patent. The origin of the left subclavian artery is stented and grossly patent. The vertebral artery is patent.  No filling defect in the pulmonary arterial tree to suggest acute pulmonary thromboembolism.  No abnormal mediastinal adenopathy.  No pericardial effusion.  No pneumothorax.  No pleural effusion  Dependent atelectasis at both posterior lungs.  Review of the MIP images confirms the above findings.  CTA ABDOMEN AND PELVIS FINDINGS  There is no evidence of aortic dissection. Aortobifemoral bypass graft is stable. Proximal anastomosis is widely patent. There is narrowing at the distal anastomosis to the right common femoral artery which is stable. Left distal anastomosis is widely patent.  50% narrowing at the origin of the celiac axis. Branch vessels are patent.  50% narrowing in the proximal SMA. There is post stenotic dilatation. Branch vessels are grossly patent.  There is severe narrowing at the origin of the right renal artery. Left renal artery is patent.  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 The native internal and external iliac arteries reconstitute.  There is noted to be mild gallbladder wall thickening. There is fluid density adjacent to the neck of the gallbladder. The common bile duct is suspected to be dilated.  Spleen, pancreas, adrenal glands, and kidneys are within normal limits. Pancreatic duct is upper normal in caliber at 4 mm.  Bladder is unremarkable. Uterus is absent. Adnexa are within normal limits.  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  No acute bony deformity.  No free-fluid in the pelvis. No abnormal retroperitoneal adenopathy.  Review of the MIP images confirms the above findings.  IMPRESSION: No evidence of aortic dissection. No acute  intra thoracic pathology  Aortobifemoral bypass graft as described. There is mild narrowing of the distal anastomosis to the right common femoral artery which is stable.  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  Significant narrowing at the origin of the right renal artery.  There is nonspecific wall thickening of the gallbladder with pericholecystic fluid. The common bile duct is also suspected to be dilated. Ultrasound may be helpful to further characterize. No definite gallstones are seen.   Electronically Signed   By: Maryclare Bean M.D.   On: 12/04/2012 20:42   Ct Angio Abd/pel W/ And/or W/o  12/04/2012   CLINICAL DATA:  Chest pain  EXAM: CT ANGIOGRAPHY CHEST, ABDOMEN AND PELVIS  TECHNIQUE: Multidetector CT imaging through the chest, abdomen and pelvis was performed using the standard protocol during bolus administration of intravenous contrast. Multiplanar reconstructed images including MIPs were obtained and reviewed to evaluate the vascular anatomy.  CONTRAST:  OMNIPAQUE IOHEXOL 350 MG/ML SOLN  COMPARISON:  None.  FINDINGS: CTA CHEST FINDINGS  No evidence of intramural hematoma. No evidence of aortic dissection or aneurysm.  Innominate artery, right subclavian artery, right vertebral artery, right common carotid artery, left common carotid artery are patent. The origin of the left subclavian artery is  stented and grossly patent. The vertebral artery is patent.  No filling defect in the pulmonary arterial tree to suggest acute pulmonary thromboembolism.  No abnormal mediastinal adenopathy.  No pericardial effusion.  No pneumothorax.  No pleural effusion  Dependent atelectasis at both posterior lungs.  Review of the MIP images confirms the above findings.  CTA ABDOMEN AND PELVIS FINDINGS  There is no evidence of aortic dissection. Aortobifemoral bypass graft is stable. Proximal anastomosis is widely patent. There is narrowing at the distal anastomosis to the right common femoral artery which is  stable. Left distal anastomosis is widely patent.  50% narrowing at the origin of the celiac axis. Branch vessels are patent.  50% narrowing in the proximal SMA. There is post stenotic dilatation. Branch vessels are grossly patent.  There is severe narrowing at the origin of the right renal artery. Left renal artery is patent.  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 The native internal and external iliac arteries reconstitute.  There is noted to be mild gallbladder wall thickening. There is fluid density adjacent to the neck of the gallbladder. The common bile duct is suspected to be dilated.  Spleen, pancreas, adrenal glands, and kidneys are within normal limits. Pancreatic duct is upper normal in caliber at 4 mm.  Bladder is unremarkable. Uterus is absent. Adnexa are within normal limits.  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  No acute bony deformity.  No free-fluid in the pelvis. No abnormal retroperitoneal adenopathy.  Review of the MIP images confirms the above findings.  IMPRESSION: No evidence of aortic dissection. No acute intra thoracic pathology  Aortobifemoral bypass graft as described. There is mild narrowing of the distal anastomosis to the right common femoral artery which is stable.  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  Significant narrowing at the origin of the right renal artery.  There is nonspecific wall thickening of the gallbladder with pericholecystic fluid. The common bile duct is also suspected to be dilated. Ultrasound may be helpful to further characterize. No definite gallstones are seen.   Electronically Signed   By: Maryclare Bean M.D.   On: 12/04/2012 20:42    Anti-infectives: Anti-infectives   Start     Dose/Rate Route Frequency Ordered Stop   12/05/12 0500  Ampicillin-Sulbactam (UNASYN) 3 g in sodium chloride 0.9 % 100 mL IVPB     3 g 100 mL/hr over 60 Minutes Intravenous Every 6 hours 12/05/12 0340     12/05/12 0115  cefTRIAXone (ROCEPHIN)  1 g in dextrose 5 % 50 mL IVPB     1 g 100 mL/hr over 30 Minutes Intravenous  Once 12/05/12 0107 12/05/12 0218      Assessment/Plan:   Probable acute or subacute cholecystitis with cholelithiasis. Improving following admission, hydration, and antibiotics. Plan cholecystectomy on Monday after Plavix and ASA wear off.. Laparoscopic attempt can be made but high risk for conversion to open.  Status post aortobifemoral bypass grafting  Status post  laparotomy for SBO.  Status post abdominal hysterectomy, left carotid endarterectomy, femoropopliteal bypass.  Continued tobacco abuse.   LOS: 1 day    Terralyn Matsumura M. Derrell Lolling, M.D., [facility] General and Minimally invasive Surgery Breast and Colorectal Surgery Office:   [phone]   12/05/2012

## 2012-12-05 NOTE — H&P (Addendum)
Kimberly [name] is an 62 y.o. female.   Chief Complaint: chest and abdominal pain HPI: 56 yof with extensive peripheral vascular history who is still actively smoking presents with acute onset tonight of first episode of epigastric/chest pain radiating straight to her back.  Associated with some nausea.  She thought she was having MI and came to hospital.  She has some baseline nausea but states never this pain before.  Her pain is still present on my evaluation.  She has undergone a ct to rule out dissection which was negative. This showed some nonspecific abnormalities and was followed by an u/s that shows stones.  She points to her ruq when asked where the pain is now. She took her plavix and aspirin today. She has ekg that is close to baseline and negative troponin. She has significant abdominal surgical history including aortic bifem bypass and then loa for sbo.  Past Medical History  Diagnosis Date  . Asthma   . Hyperlipidemia   . History of lead poisoning 1977  . Shortness of breath   . Peripheral vascular disease     claudication  right greater than left  femoral artery  . Anxiety   . Blood transfusion   . Migraine   . Subclavian artery disease     left  . Small bowel obstruction, partial 04/08/11  . Angina     March 2013  . Depression   . Uterine cancer     Pre cervical  . Fibromyalgia     Past Surgical History  Procedure Laterality Date  . Abdominal angiogram    . Aorta - bilateral femoral artery bypass graft  02/26/2011    Procedure: AORTA BIFEMORAL BYPASS GRAFT;  Surgeon: Juleen China, MD;  Location: [facility] OR;  Service: Vascular;  Laterality: N/A;  . Pr vein bypass graft,aorto-fem-pop  02/26/11  . Laparotomy  04/10/2011    Procedure: EXPLORATORY LAPAROTOMY;  Surgeon: Kandis Cocking, MD;  Location: [facility] OR;  Service: General;  Laterality: N/A;  ENTEROLYSIS OF ADHESIONS  . Abdominal hysterectomy    . Endarterectomy  06/13/2011    Procedure: ENDARTERECTOMY CAROTID;  Surgeon: Nada Libman, MD;  Location: [facility] OR;  Service: Vascular;  Laterality: Left;  Left carotid artery endarterectomy with vascu-guard patch angioplasty  . Carotid endarterectomy  06/13/2011    Left  CEA  . Lower extremity angiogram Left 06/24/12    Left stent intervention, left subclavian    Family History  Problem Relation Age of Onset  . Cancer Mother   . Anesthesia problems Neg Hx   . Cancer Brother   . Cancer Other   . Stroke Other    Social History:  reports that she has been smoking Cigarettes.  She has a 2.25 pack-year smoking history. She has never used smokeless tobacco. She reports that she does not drink alcohol or use illicit drugs.  Allergies:  Allergies  Allergen Reactions  . Other Shortness Of Breath    Cologne  . Codeine Nausea Only    dizziness    meds reviewed, include plavix and asa  Results for orders placed during the hospital encounter of 12/04/12 (from the past 48 hour(s))  CBC     Status: Abnormal   Collection Time    12/04/12  6:59 PM      Result Value Range   WBC 12.2 (*) 4.0 - 10.5 K/uL   RBC 4.27  3.87 - 5.11 MIL/uL   Hemoglobin 14.5  12.0 - 15.0 g/dL  HCT 41.0  36.0 - 46.0 %   MCV 96.0  78.0 - 100.0 fL   MCH 34.0  26.0 - 34.0 pg   MCHC 35.4  30.0 - 36.0 g/dL   RDW 16.1  09.6 - 04.5 %   Platelets 238  150 - 400 K/uL  BASIC METABOLIC PANEL     Status: Abnormal   Collection Time    12/04/12  6:59 PM      Result Value Range   Sodium 133 (*) 135 - 145 mEq/L   Potassium 4.2  3.5 - 5.1 mEq/L   Chloride 101  96 - 112 mEq/L   CO2 23  19 - 32 mEq/L   Glucose, Bld 139 (*) 70 - 99 mg/dL   BUN 18  6 - 23 mg/dL   Creatinine, Ser 4.09 (*) 0.50 - 1.10 mg/dL   Calcium 9.2  8.4 - 81.1 mg/dL   GFR calc non Af Amer 50 (*) >90 mL/min   GFR calc Af Amer 58 (*) >90 mL/min   Comment: (NOTE)     The eGFR has been calculated using the CKD EPI equation.     This calculation has not been validated in all clinical situations.     eGFR's persistently <90 mL/min signify  possible Chronic Kidney     Disease.  PRO B NATRIURETIC PEPTIDE     Status: None   Collection Time    12/04/12  6:59 PM      Result Value Range   Pro B Natriuretic peptide (BNP) 46.9  0 - 125 pg/mL  POCT I-STAT TROPONIN I     Status: None   Collection Time    12/04/12  7:09 PM      Result Value Range   Troponin i, poc 0.00  0.00 - 0.08 ng/mL   Comment 3            Comment: Due to the release kinetics of cTnI,     a negative result within the first hours     of the onset of symptoms does not rule out     myocardial infarction with certainty.     If myocardial infarction is still suspected,     repeat the test at appropriate intervals.  TROPONIN I     Status: None   Collection Time    12/04/12 10:00 PM      Result Value Range   Troponin I <0.30  <0.30 ng/mL   Comment:            Due to the release kinetics of cTnI,     a negative result within the first hours     of the onset of symptoms does not rule out     myocardial infarction with certainty.     If myocardial infarction is still suspected,     repeat the test at appropriate intervals.  HEPATIC FUNCTION PANEL     Status: Abnormal   Collection Time    12/04/12 10:00 PM      Result Value Range   Total Protein 5.7 (*) 6.0 - 8.3 g/dL   Albumin 2.9 (*) 3.5 - 5.2 g/dL   AST 17  0 - 37 U/L   ALT 10  0 - 35 U/L   Alkaline Phosphatase 81  39 - 117 U/L   Total Bilirubin 0.2 (*) 0.3 - 1.2 mg/dL   Bilirubin, Direct 0.2  0.0 - 0.3 mg/dL   Indirect Bilirubin 0.0 (*) 0.3 - 0.9 mg/dL  LIPASE,  BLOOD     Status: None   Collection Time    12/04/12 10:00 PM      Result Value Range   Lipase 32  11 - 59 U/L  URINALYSIS, ROUTINE W REFLEX MICROSCOPIC     Status: Abnormal   Collection Time    12/04/12 10:33 PM      Result Value Range   Color, Urine YELLOW  YELLOW   APPearance CLEAR  CLEAR   Specific Gravity, Urine 1.010  1.005 - 1.030   pH 5.0  5.0 - 8.0   Glucose, UA NEGATIVE  NEGATIVE mg/dL   Hgb urine dipstick LARGE (*) NEGATIVE     Bilirubin Urine NEGATIVE  NEGATIVE   Ketones, ur NEGATIVE  NEGATIVE mg/dL   Protein, ur NEGATIVE  NEGATIVE mg/dL   Urobilinogen, UA 0.2  0.0 - 1.0 mg/dL   Nitrite POSITIVE (*) NEGATIVE   Leukocytes, UA NEGATIVE  NEGATIVE  URINE MICROSCOPIC-ADD ON     Status: Abnormal   Collection Time    12/04/12 10:33 PM      Result Value Range   Squamous Epithelial / LPF MANY (*) RARE   RBC / HPF 3-6  <3 RBC/hpf   Bacteria, UA FEW (*) RARE   US Abdomen Complete  12/05/2012   CLINICAL DATA:  62 year old female with right upper quadrant abdominal pain  EXAM: ULTRASOUND ABDOMEN COMPLETE  COMPARISON:  None.  FINDINGS: Gallbladder:  A few mobile gallstones are noted, largest measuring 5 mm. Upper limits of normal gallbladder wall thickness is noted measuring 3 mm. The small amount of pericholecystic fluid identified on CT is difficult to visualize, likely due to location. No sonographic Eulah Pont sign is identified.  Common bile duct:  Diameter: 11 mm. No filling defects in the visualized portions of the CBD are noted. Mild fullness of the intrahepatic biliary system is noted.  Liver:  No focal lesion identified. Within normal limits in parenchymal echogenicity.  IVC:  No abnormality visualized.  Pancreas:  Visualized portion unremarkable.  Spleen:  Size and appearance within normal limits.  Right Kidney:  Length: 11.3 cm. Echogenicity within normal limits. No mass or hydronephrosis visualized.  Left Kidney:  Length: 11.6 cm. Echogenicity within normal limits. No mass or hydronephrosis visualized.  Abdominal aorta:  No aneurysm visualized.  Other findings:  There is no evidence of ascites.  IMPRESSION: Cholelithiasis with upper limits of normal gallbladder wall thickness -suspicious for acute cholecystitis. Mild CBD and intrahepatic biliary dilatation.   Electronically Signed   By: Laveda Abbe M.D.   On: 12/05/2012 00:50   Dg Chest Port 1 View  12/04/2012   CLINICAL DATA:  Chest pain and short of breath  EXAM: PORTABLE  CHEST - 1 VIEW  COMPARISON:  04/10/2011  FINDINGS: Lungs are under aerated and clear. Normal heart size. No pneumothorax.  IMPRESSION: No active disease.   Electronically Signed   By: Maryclare Bean M.D.   On: 12/04/2012 19:17   Ct Angio Chest Aortic Dissect W &/or W/o  12/04/2012   CLINICAL DATA:  Chest pain  EXAM: CT ANGIOGRAPHY CHEST, ABDOMEN AND PELVIS  TECHNIQUE: Multidetector CT imaging through the chest, abdomen and pelvis was performed using the standard protocol during bolus administration of intravenous contrast. Multiplanar reconstructed images including MIPs were obtained and reviewed to evaluate the vascular anatomy.  CONTRAST:  OMNIPAQUE IOHEXOL 350 MG/ML SOLN  COMPARISON:  None.  FINDINGS: CTA CHEST FINDINGS  No evidence of intramural hematoma. No evidence of aortic dissection or aneurysm.  Innominate artery, right subclavian artery, right vertebral artery, right common carotid artery, left common carotid artery are patent. The origin of the left subclavian artery is stented and grossly patent. The vertebral artery is patent.  No filling defect in the pulmonary arterial tree to suggest acute pulmonary thromboembolism.  No abnormal mediastinal adenopathy.  No pericardial effusion.  No pneumothorax.  No pleural effusion  Dependent atelectasis at both posterior lungs.  Review of the MIP images confirms the above findings.  CTA ABDOMEN AND PELVIS FINDINGS  There is no evidence of aortic dissection. Aortobifemoral bypass graft is stable. Proximal anastomosis is widely patent. There is narrowing at the distal anastomosis to the right common femoral artery which is stable. Left distal anastomosis is widely patent.  50% narrowing at the origin of the celiac axis. Branch vessels are patent.  50% narrowing in the proximal SMA. There is post stenotic dilatation. Branch vessels are grossly patent.  There is severe narrowing at the origin of the right renal artery. Left renal artery is patent.  Native common  iliac arteries are occluded. The native internal and external iliac arteries reconstitute.  There is noted to be mild gallbladder wall thickening. There is fluid density adjacent to the neck of the gallbladder. The common bile duct is suspected to be dilated.  Spleen, pancreas, adrenal glands, and kidneys are within normal limits. Pancreatic duct is upper normal in caliber at 4 mm.  Bladder is unremarkable. Uterus is absent. Adnexa are within normal limits.  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  No acute bony deformity.  No free-fluid in the pelvis. No abnormal retroperitoneal adenopathy.  Review of the MIP images confirms the above findings.  IMPRESSION: No evidence of aortic dissection. No acute intra thoracic pathology  Aortobifemoral bypass graft as described. There is mild narrowing of the distal anastomosis to the right common femoral artery which is stable.  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  Significant narrowing at the origin of the right renal artery.  There is nonspecific wall thickening of the gallbladder with pericholecystic fluid. The common bile duct is also suspected to be dilated. Ultrasound may be helpful to further characterize. No definite gallstones are seen.   Electronically Signed   By: Maryclare Bean M.D.   On: 12/04/2012 20:42   Ct Angio Abd/pel W/ And/or W/o  12/04/2012   CLINICAL DATA:  Chest pain  EXAM: CT ANGIOGRAPHY CHEST, ABDOMEN AND PELVIS  TECHNIQUE: Multidetector CT imaging through the chest, abdomen and pelvis was performed using the standard protocol during bolus administration of intravenous contrast. Multiplanar reconstructed images including MIPs were obtained and reviewed to evaluate the vascular anatomy.  CONTRAST:  OMNIPAQUE IOHEXOL 350 MG/ML SOLN  COMPARISON:  None.  FINDINGS: CTA CHEST FINDINGS  No evidence of intramural hematoma. No evidence of aortic dissection or aneurysm.  Innominate artery, right subclavian artery, right  vertebral artery, right common carotid artery, left common carotid artery are patent. The origin of the left subclavian artery is stented and grossly patent. The vertebral artery is patent.  No filling defect in the pulmonary arterial tree to suggest acute pulmonary thromboembolism.  No abnormal mediastinal adenopathy.  No pericardial effusion.  No pneumothorax.  No pleural effusion  Dependent atelectasis at both posterior lungs.  Review of the MIP images confirms the above findings.  CTA ABDOMEN AND PELVIS FINDINGS  There is no evidence of aortic dissection. Aortobifemoral bypass graft is stable. Proximal anastomosis is widely patent. There is  narrowing at the distal anastomosis to the right common femoral artery which is stable. Left distal anastomosis is widely patent.  50% narrowing at the origin of the celiac axis. Branch vessels are patent.  50% narrowing in the proximal SMA. There is post stenotic dilatation. Branch vessels are grossly patent.  There is severe narrowing at the origin of the right renal artery. Left renal artery is patent.  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 The native internal and external iliac arteries reconstitute.  There is noted to be mild gallbladder wall thickening. There is fluid density adjacent to the neck of the gallbladder. The common bile duct is suspected to be dilated.  Spleen, pancreas, adrenal glands, and kidneys are within normal limits. Pancreatic duct is upper normal in caliber at 4 mm.  Bladder is unremarkable. Uterus is absent. Adnexa are within normal limits.  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  No acute bony deformity.  No free-fluid in the pelvis. No abnormal retroperitoneal adenopathy.  Review of the MIP images confirms the above findings.  IMPRESSION: No evidence of aortic dissection. No acute intra thoracic pathology  Aortobifemoral bypass graft as described. There is mild narrowing of the distal anastomosis to the right common  femoral artery which is stable.  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  Significant narrowing at the origin of the right renal artery.  There is nonspecific wall thickening of the gallbladder with pericholecystic fluid. The common bile duct is also suspected to be dilated. Ultrasound may be helpful to further characterize. No definite gallstones are seen.   Electronically Signed   By: Maryclare Bean M.D.   On: 12/04/2012 20:42    Review of Systems  Constitutional: Negative for fever and chills.  Respiratory: Negative for cough and shortness of breath.   Cardiovascular: Positive for chest pain and claudication. Negative for palpitations and leg swelling.  Gastrointestinal: Positive for nausea and abdominal pain. Negative for vomiting.    Blood pressure 120/55, pulse 68, temperature 98.3 F (36.8 C), temperature source Oral, resp. rate 20, height 5\' 4"  (1.626 m), weight 150 lb (68.04 kg), SpO2 97.00%. Physical Exam  Constitutional: She is oriented to person, place, and time. She appears well-developed.  HENT:  Head: Normocephalic and atraumatic.  Eyes: No scleral icterus.  Neck: Neck supple. Carotid bruit is present (right).    Cardiovascular: Normal rate, regular rhythm, normal heart sounds and intact distal pulses.   Respiratory: Effort normal and breath sounds normal. She has no wheezes. She has no rales.  GI: Soft. Bowel sounds are normal. She exhibits no distension. There is tenderness in the right upper quadrant and epigastric area. There is positive Murphy's sign. No hernia. Hernia confirmed negative in the ventral area.    Musculoskeletal: Normal range of motion.  Lymphadenopathy:    She has no cervical adenopathy.  Neurological: She is alert and oriented to person, place, and time.     Assessment/Plan Cholecystitis  She has stones on her u/s and I think cholecystitis by exam, elevated wbc.  She just took plavix and asa so will plan on admission, start iv abx and then  plan cholecystectomy this admission.  I think this certainly has risk to be open given her surgical history and for plavix to be off would be best.  I discussed plan with her.  She did not want admission due to social situation but I think she is agreeable after we discussed risks of not coming into hospital with  further issues with this episode.    Kimberly [name] 12/05/2012, 1:29 AM

## 2012-12-05 NOTE — Progress Notes (Signed)
Pt ambulated in hallway about 300 ft and tolerated well.  She is refusing her SCDs and Heparin until after surgery for the most part.  She stated, "I don't know why I have to use these things before I have surgery when I am walking around."  I explained the risks to her again, and she verbalized understanding.  Will try again later this afternoon to encourage compliance with the heparin and SCD use.  Pt also requested coffee, but I told her it would have to be decaf since her BP was elevated.  She is frustrated and says this is causing her headache.  I asked her if she would like a nicotine patch in case the headache is from withdrawal.  Pt declined nicotine patch and more tylenol for headache.  Will continue to monitor.

## 2012-12-06 LAB — COMPREHENSIVE METABOLIC PANEL
ALT: 10 U/L (ref 0–35)
AST: 15 U/L (ref 0–37)
Albumin: 3.1 g/dL — ABNORMAL LOW (ref 3.5–5.2)
Alkaline Phosphatase: 76 U/L (ref 39–117)
BUN: 8 mg/dL (ref 6–23)
CO2: 29 mEq/L (ref 19–32)
Calcium: 8.6 mg/dL (ref 8.4–10.5)
Chloride: 105 mEq/L (ref 96–112)
Creatinine, Ser: 0.77 mg/dL (ref 0.50–1.10)
GFR calc Af Amer: >90 mL/min (ref >90)
GFR calc non Af Amer: 89 mL/min — ABNORMAL LOW (ref >90)
Glucose, Bld: 91 mg/dL (ref 70–99)
Potassium: 3.7 mEq/L (ref 3.5–5.1)
Sodium: 142 mEq/L (ref 135–145)
Total Bilirubin: 0.3 mg/dL (ref 0.3–1.2)
Total Protein: 6 g/dL (ref 6.0–8.3)

## 2012-12-06 LAB — CBC
HCT: 40.7 % (ref 36.0–46.0)
Hemoglobin: 14.1 g/dL (ref 12.0–15.0)
MCH: 33.3 pg (ref 26.0–34.0)
MCHC: 34.6 g/dL (ref 30.0–36.0)
MCV: 96.2 fL (ref 78.0–100.0)
Platelets: 225 10*3/uL (ref 150–400)
RBC: 4.23 MIL/uL (ref 3.87–5.11)
RDW: 13.3 % (ref 11.5–15.5)
WBC: 9.2 10*3/uL (ref 4.0–10.5)

## 2012-12-06 MED ORDER — CHLORHEXIDINE GLUCONATE 4 % EX LIQD
1.0000 "application " | Freq: Once | CUTANEOUS | Status: AC
  Administered 2012-12-07: 1 via TOPICAL
  Filled 2012-12-06 (×2): qty 15

## 2012-12-06 NOTE — Progress Notes (Signed)
Subjective: Patient is very alert and oriented this morning. She is afebrile and hemodynamically stable.Tolerating clear liquids. Says her abdominal pain is much better. As lots of questions about cholecystectomy.    Objective: Vital signs in last 24 hours: Temp:  [97.4 F (36.3 C)-97.6 F (36.4 C)] 97.5 F (36.4 C) (11/30 0421) Pulse Rate:  [61-67] 61 (11/30 0421) Resp:  [16-18] 18 (11/30 0421) BP: (126-157)/(42-72) 152/72 mmHg (11/30 0421) SpO2:  [97 %-100 %] 97 % (11/30 0421) Weight:  [159 lb 9.8 oz (72.4 kg)] 159 lb 9.8 oz (72.4 kg) (11/30 0421) Last BM Date: 12/04/12  Intake/Output from previous day: 11/29 0701 - 11/30 0700 In: 720 [P.O.:720] Out: 700 [Urine:700] Intake/Output this shift:    General appearance: alert. Cooperative. Oriented. Mental status normal. No distress. Resp: clear to auscultation bilaterally GI: abdomen is soft and nondistended. Subjectively slightly tender to deep palpation right upper quadrant,  no guarding, no mass. Long midline incision well healed.  Lab Results:   Recent Labs  12/05/12 0610 12/06/12 0535  WBC 9.5 9.2  HGB 13.3 14.1  HCT 38.9 40.7  PLT 206 225   BMET  Recent Labs  12/05/12 0610 12/06/12 0535  NA 139 142  K 3.8 3.7  CL 105 105  CO2 21 29  GLUCOSE 84 91  BUN 13 8  CREATININE 0.81 0.77  CALCIUM 8.5 8.6   PT/INR No results found for this basename: LABPROT, INR,  in the last 72 hours ABG No results found for this basename: PHART, PCO2, PO2, HCO3,  in the last 72 hours  Studies/Results: US Abdomen Complete  12/05/2012   CLINICAL DATA:  62 year old female with right upper quadrant abdominal pain  EXAM: ULTRASOUND ABDOMEN COMPLETE  COMPARISON:  None.  FINDINGS: Gallbladder:  A few mobile gallstones are noted, largest measuring 5 mm. Upper limits of normal gallbladder wall thickness is noted measuring 3 mm. The small amount of pericholecystic fluid identified on CT is difficult to visualize, likely due to  location. No sonographic Eulah Pont sign is identified.  Common bile duct:  Diameter: 11 mm. No filling defects in the visualized portions of the CBD are noted. Mild fullness of the intrahepatic biliary system is noted.  Liver:  No focal lesion identified. Within normal limits in parenchymal echogenicity.  IVC:  No abnormality visualized.  Pancreas:  Visualized portion unremarkable.  Spleen:  Size and appearance within normal limits.  Right Kidney:  Length: 11.3 cm. Echogenicity within normal limits. No mass or hydronephrosis visualized.  Left Kidney:  Length: 11.6 cm. Echogenicity within normal limits. No mass or hydronephrosis visualized.  Abdominal aorta:  No aneurysm visualized.  Other findings:  There is no evidence of ascites.  IMPRESSION: Cholelithiasis with upper limits of normal gallbladder wall thickness -suspicious for acute cholecystitis. Mild CBD and intrahepatic biliary dilatation.   Electronically Signed   By: Laveda Abbe M.D.   On: 12/05/2012 00:50   Dg Chest Port 1 View  12/04/2012   CLINICAL DATA:  Chest pain and short of breath  EXAM: PORTABLE CHEST - 1 VIEW  COMPARISON:  04/10/2011  FINDINGS: Lungs are under aerated and clear. Normal heart size. No pneumothorax.  IMPRESSION: No active disease.   Electronically Signed   By: Maryclare Bean M.D.   On: 12/04/2012 19:17   Ct Angio Chest Aortic Dissect W &/or W/o  12/04/2012   CLINICAL DATA:  Chest pain  EXAM: CT ANGIOGRAPHY CHEST, ABDOMEN AND PELVIS  TECHNIQUE: Multidetector CT imaging through the chest, abdomen and  pelvis was performed using the standard protocol during bolus administration of intravenous contrast. Multiplanar reconstructed images including MIPs were obtained and reviewed to evaluate the vascular anatomy.  CONTRAST:  OMNIPAQUE IOHEXOL 350 MG/ML SOLN  COMPARISON:  None.  FINDINGS: CTA CHEST FINDINGS  No evidence of intramural hematoma. No evidence of aortic dissection or aneurysm.  Innominate artery, right subclavian artery, right  vertebral artery, right common carotid artery, left common carotid artery are patent. The origin of the left subclavian artery is stented and grossly patent. The vertebral artery is patent.  No filling defect in the pulmonary arterial tree to suggest acute pulmonary thromboembolism.  No abnormal mediastinal adenopathy.  No pericardial effusion.  No pneumothorax.  No pleural effusion  Dependent atelectasis at both posterior lungs.  Review of the MIP images confirms the above findings.  CTA ABDOMEN AND PELVIS FINDINGS  There is no evidence of aortic dissection. Aortobifemoral bypass graft is stable. Proximal anastomosis is widely patent. There is narrowing at the distal anastomosis to the right common femoral artery which is stable. Left distal anastomosis is widely patent.  50% narrowing at the origin of the celiac axis. Branch vessels are patent.  50% narrowing in the proximal SMA. There is post stenotic dilatation. Branch vessels are grossly patent.  There is severe narrowing at the origin of the right renal artery. Left renal artery is patent.  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 The native internal and external iliac arteries reconstitute.  There is noted to be mild gallbladder wall thickening. There is fluid density adjacent to the neck of the gallbladder. The common bile duct is suspected to be dilated.  Spleen, pancreas, adrenal glands, and kidneys are within normal limits. Pancreatic duct is upper normal in caliber at 4 mm.  Bladder is unremarkable. Uterus is absent. Adnexa are within normal limits.  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  No acute bony deformity.  No free-fluid in the pelvis. No abnormal retroperitoneal adenopathy.  Review of the MIP images confirms the above findings.  IMPRESSION: No evidence of aortic dissection. No acute intra thoracic pathology  Aortobifemoral bypass graft as described. There is mild narrowing of the distal anastomosis to the right common  femoral artery which is stable.  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  Significant narrowing at the origin of the right renal artery.  There is nonspecific wall thickening of the gallbladder with pericholecystic fluid. The common bile duct is also suspected to be dilated. Ultrasound may be helpful to further characterize. No definite gallstones are seen.   Electronically Signed   By: Maryclare Bean M.D.   On: 12/04/2012 20:42   Ct Angio Abd/pel W/ And/or W/o  12/04/2012   CLINICAL DATA:  Chest pain  EXAM: CT ANGIOGRAPHY CHEST, ABDOMEN AND PELVIS  TECHNIQUE: Multidetector CT imaging through the chest, abdomen and pelvis was performed using the standard protocol during bolus administration of intravenous contrast. Multiplanar reconstructed images including MIPs were obtained and reviewed to evaluate the vascular anatomy.  CONTRAST:  OMNIPAQUE IOHEXOL 350 MG/ML SOLN  COMPARISON:  None.  FINDINGS: CTA CHEST FINDINGS  No evidence of intramural hematoma. No evidence of aortic dissection or aneurysm.  Innominate artery, right subclavian artery, right vertebral artery, right common carotid artery, left common carotid artery are patent. The origin of the left subclavian artery is stented and grossly patent. The vertebral artery is patent.  No filling defect in the pulmonary arterial tree to suggest acute pulmonary thromboembolism.  No abnormal mediastinal adenopathy.  No pericardial effusion.  No pneumothorax.  No pleural effusion  Dependent atelectasis at both posterior lungs.  Review of the MIP images confirms the above findings.  CTA ABDOMEN AND PELVIS FINDINGS  There is no evidence of aortic dissection. Aortobifemoral bypass graft is stable. Proximal anastomosis is widely patent. There is narrowing at the distal anastomosis to the right common femoral artery which is stable. Left distal anastomosis is widely patent.  50% narrowing at the origin of the celiac axis. Branch vessels are patent.  50%  narrowing in the proximal SMA. There is post stenotic dilatation. Branch vessels are grossly patent.  There is severe narrowing at the origin of the right renal artery. Left renal artery is patent.  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 The native internal and external iliac arteries reconstitute.  There is noted to be mild gallbladder wall thickening. There is fluid density adjacent to the neck of the gallbladder. The common bile duct is suspected to be dilated.  Spleen, pancreas, adrenal glands, and kidneys are within normal limits. Pancreatic duct is upper normal in caliber at 4 mm.  Bladder is unremarkable. Uterus is absent. Adnexa are within normal limits.  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  No acute bony deformity.  No free-fluid in the pelvis. No abnormal retroperitoneal adenopathy.  Review of the MIP images confirms the above findings.  IMPRESSION: No evidence of aortic dissection. No acute intra thoracic pathology  Aortobifemoral bypass graft as described. There is mild narrowing of the distal anastomosis to the right common femoral artery which is stable.  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  Significant narrowing at the origin of the right renal artery.  There is nonspecific wall thickening of the gallbladder with pericholecystic fluid. The common bile duct is also suspected to be dilated. Ultrasound may be helpful to further characterize. No definite gallstones are seen.   Electronically Signed   By: Maryclare Bean M.D.   On: 12/04/2012 20:42    Anti-infectives: Anti-infectives   Start     Dose/Rate Route Frequency Ordered Stop   12/05/12 0500  Ampicillin-Sulbactam (UNASYN) 3 g in sodium chloride 0.9 % 100 mL IVPB     3 g 100 mL/hr over 60 Minutes Intravenous Every 6 hours 12/05/12 0340     12/05/12 0115  cefTRIAXone (ROCEPHIN) 1 g in dextrose 5 % 50 mL IVPB     1 g 100 mL/hr over 30 Minutes Intravenous  Once 12/05/12 0107 12/05/12 0218       Assessment/Plan:  Probable acute or subacute cholecystitis with cholelithiasis.  Improving following admission, hydration, and antibiotics.  Plan cholecystectomy on Monday after Plavix and ASA wear off.. Laparoscopic attempt can be made but high risk for conversion to open.   I spent a long time discussing the surgery with her. I discussed the indications, details, techniques, and numerous risks of surgery with her. She's where the risk of bleeding, infection, conversion to open laparotomy, leak, injury to adjacent organs, wound healing problems, and so forth. She understands all of these issues. All of her questions were answered. She agrees with this plan. I told her that Dr. Jamey Ripa would most likely do her surgery.   Status post aortobifemoral bypass grafting  Status post laparotomy for SBO.  Status post abdominal hysterectomy, left carotid endarterectomy, femoropopliteal bypass.  Continued tobacco abuse.   Kimberly [name]. Derrell Lolling, M.D., [facility] General and Minimally invasive Surgery Breast and  Colorectal Surgery Office:   [phone] Pager:   [phone]   LOS: 2 days    Ernestene Mention 12/06/2012

## 2012-12-07 LAB — SURGICAL PCR SCREEN
MRSA, PCR: NEGATIVE
Staphylococcus aureus: NEGATIVE

## 2012-12-07 MED ORDER — HEPARIN SODIUM (PORCINE) 5000 UNIT/ML IJ SOLN
5000.0000 [IU] | Freq: Three times a day (TID) | INTRAMUSCULAR | Status: AC
  Administered 2012-12-07 (×2): 5000 [IU] via SUBCUTANEOUS
  Filled 2012-12-07 (×2): qty 1

## 2012-12-07 NOTE — Progress Notes (Signed)
<  principal problem not specified>  Assessment: Acute cholecystitis Vasculopath   Plan: Will try to add on schedule for today. She is likely to have to be done open as I have explained to her as has Dr Derrell Lolling. She has been off plavix long enough for [place] to do today.   Subjective: Still some ruq pain, mild nausea, not as bas as when she came to the hospital  Objective: Vital signs in last 24 hours: Temp:  [97.5 F (36.4 C)-97.9 F (36.6 C)] 97.9 F (36.6 C) (12/01 0505) Pulse Rate:  [61-65] 65 (12/01 0505) Resp:  [16-18] 16 (12/01 0505) BP: (138-150)/(59-75) 149/60 mmHg (12/01 0505) SpO2:  [94 %-98 %] 94 % (12/01 0505) Weight:  [160 lb 0.9 oz (72.6 kg)] 160 lb 0.9 oz (72.6 kg) (12/01 0505) Last BM Date: 12/06/12  Intake/Output from previous day: 11/30 0701 - 12/01 0700 In: 840 [P.O.:840] Out: -   General appearance: alert, cooperative and mild distress Resp: clear to auscultation bilaterally GI: Soft, but tender in RUQ, mild voluntary guarding, no rebound, rest of abd is soft and not tender  Lab Results:  Results for orders placed during the hospital encounter of 12/04/12 (from the past 24 hour(s))  SURGICAL PCR SCREEN     Status: None   Collection Time    12/07/12  3:43 AM      Result Value Range   MRSA, PCR NEGATIVE  NEGATIVE   Staphylococcus aureus NEGATIVE  NEGATIVE     Studies/Results Radiology     MEDS, Scheduled . ampicillin-sulbactam (UNASYN) IV  3 g Intravenous Q6H  . pantoprazole (PROTONIX) IV  40 mg Intravenous QHS       LOS: 3 days    Currie Paris, MD, [facility], [place] [phone]   12/07/2012 8:19 AM

## 2012-12-07 NOTE — Care Management Note (Unsigned)
    Page 1 of 1   12/07/2012     4:19:44 PM   CARE MANAGEMENT NOTE 12/07/2012  Patient:  Kimberly [name], Kimberly [name]   Account Number:  [id number]  Date Initiated:  12/07/2012  Documentation initiated by:  Menno Vanbergen  Subjective/Objective Assessment:   PT ADM ON 12/04/12 WITH CHOLECYSTITIS.  PTA, PT INDEPENDENT, LIVES ALONE.     Action/Plan:   WILL FOLLOW FOR DC NEEDS AS PT PROGRESSES.   Anticipated DC Date:  12/08/2012   Anticipated DC Plan:  HOME/SELF CARE      DC Planning Services  CM consult      Choice offered to / List presented to:             Status of service:  In process, will continue to follow Medicare Important Message given?   (If response is "NO", the following Medicare IM given date fields will be blank) Date Medicare IM given:   Date Additional Medicare IM given:    Discharge Disposition:    Per UR Regulation:  Reviewed for med. necessity/level of care/duration of stay  If discussed at Long Length of Stay Meetings, dates discussed:    Comments:

## 2012-12-07 NOTE — Progress Notes (Deleted)
Patient begin feeling SOB; O2 stats showed 88%; placed patient on 2L and requested PRN breathing tx from MD; Scheduled lasix was given; will continue to monitor.  BARNETT, Geroge Baseman

## 2012-12-08 ENCOUNTER — Inpatient Hospital Stay (HOSPITAL_COMMUNITY): Payer: Medicaid Other | Admitting: Anesthesiology

## 2012-12-08 ENCOUNTER — Encounter (HOSPITAL_COMMUNITY): Admission: EM | Disposition: A | Discharge status: Home / Self Care | Payer: Self-pay | Source: Home / Self Care

## 2012-12-08 ENCOUNTER — Encounter (HOSPITAL_COMMUNITY): Payer: Medicaid Other | Admitting: Anesthesiology

## 2012-12-08 ENCOUNTER — Inpatient Hospital Stay (HOSPITAL_COMMUNITY): Payer: Medicaid Other

## 2012-12-08 DIAGNOSIS — K811 Chronic cholecystitis: Secondary | ICD-10-CM

## 2012-12-08 HISTORY — PX: CHOLECYSTECTOMY: SHX55

## 2012-12-08 SURGERY — LAPAROSCOPIC CHOLECYSTECTOMY WITH INTRAOPERATIVE CHOLANGIOGRAM
Anesthesia: General | Site: Abdomen

## 2012-12-08 MED ORDER — ONDANSETRON HCL 4 MG/2ML IJ SOLN: 4.0000 mg | Freq: Once | INTRAMUSCULAR | Status: DC | PRN

## 2012-12-08 MED ORDER — DEXAMETHASONE SODIUM PHOSPHATE 4 MG/ML IJ SOLN
INTRAMUSCULAR | Status: DC | PRN
  Administered 2012-12-08: 8 mg via INTRAVENOUS

## 2012-12-08 MED ORDER — MIDAZOLAM HCL 5 MG/5ML IJ SOLN
INTRAMUSCULAR | Status: DC | PRN
  Administered 2012-12-08: 2 mg via INTRAVENOUS

## 2012-12-08 MED ORDER — KCL IN DEXTROSE-NACL 20-5-0.45 MEQ/L-%-% IV SOLN
INTRAVENOUS | Status: DC
  Administered 2012-12-08: 75 mL/h via INTRAVENOUS
  Administered 2012-12-09 (×2): via INTRAVENOUS
  Filled 2012-12-08 (×7): qty 1000

## 2012-12-08 MED ORDER — SODIUM CHLORIDE 0.9 % IR SOLN
Status: DC | PRN
  Administered 2012-12-08: 1000 mL

## 2012-12-08 MED ORDER — LACTATED RINGERS IV SOLN
INTRAVENOUS | Status: DC
  Administered 2012-12-08: 09:00:00 via INTRAVENOUS

## 2012-12-08 MED ORDER — HYDROMORPHONE HCL PF 1 MG/ML IJ SOLN
0.2500 mg | INTRAMUSCULAR | Status: DC | PRN
  Administered 2012-12-08: 0.5 mg via INTRAVENOUS

## 2012-12-08 MED ORDER — BUPIVACAINE HCL (PF) 0.25 % IJ SOLN
INTRAMUSCULAR | Status: AC
  Filled 2012-12-08: qty 30

## 2012-12-08 MED ORDER — NEOSTIGMINE METHYLSULFATE 1 MG/ML IJ SOLN
INTRAMUSCULAR | Status: DC | PRN
  Administered 2012-12-08: 3 mg via INTRAVENOUS

## 2012-12-08 MED ORDER — HYDROMORPHONE HCL PF 2 MG/ML IJ SOLN: 2.0000 mg | INTRAMUSCULAR | Status: DC | PRN

## 2012-12-08 MED ORDER — ONDANSETRON HCL 4 MG/2ML IJ SOLN
INTRAMUSCULAR | Status: DC | PRN
  Administered 2012-12-08: 4 mg via INTRAVENOUS

## 2012-12-08 MED ORDER — BUPIVACAINE HCL (PF) 0.25 % IJ SOLN
INTRAMUSCULAR | Status: DC | PRN
  Administered 2012-12-08: 11 mL

## 2012-12-08 MED ORDER — SODIUM CHLORIDE 0.9 % IV SOLN
INTRAVENOUS | Status: DC | PRN
  Administered 2012-12-08: 10:00:00

## 2012-12-08 MED ORDER — LACTATED RINGERS IV SOLN: INTRAVENOUS | Status: DC | PRN

## 2012-12-08 MED ORDER — OXYCODONE HCL 5 MG PO TABS: 5.0000 mg | ORAL_TABLET | Freq: Once | ORAL | Status: DC | PRN

## 2012-12-08 MED ORDER — LACTATED RINGERS IV SOLN
INTRAVENOUS | Status: DC | PRN
  Administered 2012-12-08: 09:00:00 via INTRAVENOUS

## 2012-12-08 MED ORDER — HEPARIN SODIUM (PORCINE) 5000 UNIT/ML IJ SOLN
5000.0000 [IU] | Freq: Three times a day (TID) | INTRAMUSCULAR | Status: DC
  Administered 2012-12-09 – 2012-12-10 (×4): 5000 [IU] via SUBCUTANEOUS
  Filled 2012-12-08 (×7): qty 1

## 2012-12-08 MED ORDER — OXYCODONE HCL 5 MG/5ML PO SOLN: 5.0000 mg | Freq: Once | ORAL | Status: DC | PRN

## 2012-12-08 MED ORDER — HYDROMORPHONE HCL PF 1 MG/ML IJ SOLN
0.5000 mg | INTRAMUSCULAR | Status: DC | PRN
  Administered 2012-12-08 – 2012-12-09 (×4): 0.5 mg via INTRAVENOUS
  Filled 2012-12-08 (×4): qty 1

## 2012-12-08 MED ORDER — OXYCODONE-ACETAMINOPHEN 5-325 MG PO TABS
1.0000 | ORAL_TABLET | ORAL | Status: DC | PRN
  Filled 2012-12-08: qty 2

## 2012-12-08 MED ORDER — FENTANYL CITRATE 0.05 MG/ML IJ SOLN
INTRAMUSCULAR | Status: DC | PRN
  Administered 2012-12-08 (×3): 100 ug via INTRAVENOUS
  Administered 2012-12-08: 50 ug via INTRAVENOUS
  Administered 2012-12-08: 150 ug via INTRAVENOUS

## 2012-12-08 MED ORDER — MEPERIDINE HCL 25 MG/ML IJ SOLN: 6.2500 mg | INTRAMUSCULAR | Status: DC | PRN

## 2012-12-08 MED ORDER — GLYCOPYRROLATE 0.2 MG/ML IJ SOLN
INTRAMUSCULAR | Status: DC | PRN
  Administered 2012-12-08: 0.6 mg via INTRAVENOUS

## 2012-12-08 MED ORDER — ROCURONIUM BROMIDE 100 MG/10ML IV SOLN
INTRAVENOUS | Status: DC | PRN
  Administered 2012-12-08: 50 mg via INTRAVENOUS

## 2012-12-08 MED ORDER — BUPIVACAINE HCL (PF) 0.25 % IJ SOLN
INTRAMUSCULAR | Status: AC
  Filled 2012-12-08: qty 10

## 2012-12-08 MED ORDER — HYDROMORPHONE HCL PF 1 MG/ML IJ SOLN
INTRAMUSCULAR | Status: AC
  Filled 2012-12-08: qty 1

## 2012-12-08 MED ORDER — 0.9 % SODIUM CHLORIDE (POUR BTL) OPTIME
TOPICAL | Status: DC | PRN
  Administered 2012-12-08: 1000 mL

## 2012-12-08 MED ORDER — LIDOCAINE HCL (CARDIAC) 20 MG/ML IV SOLN
INTRAVENOUS | Status: DC | PRN
  Administered 2012-12-08: 100 mg via INTRAVENOUS

## 2012-12-08 MED ORDER — PROPOFOL 10 MG/ML IV BOLUS
INTRAVENOUS | Status: DC | PRN
  Administered 2012-12-08: 100 mg via INTRAVENOUS

## 2012-12-08 SURGICAL SUPPLY — 46 items
ADH SKN CLS APL DERMABOND .7 (GAUZE/BANDAGES/DRESSINGS)
APPLIER CLIP ROT 10 11.4 M/L (STAPLE) ×2
APR CLP MED LRG 11.4X10 (STAPLE) ×1
BAG SPEC RTRVL LRG 6X4 10 (ENDOMECHANICALS) ×1
CANISTER SUCTION 2500CC (MISCELLANEOUS) ×2 IMPLANT
CHLORAPREP W/TINT 26ML (MISCELLANEOUS) ×2 IMPLANT
CLIP APPLIE ROT 10 11.4 M/L (STAPLE) ×1 IMPLANT
COVER MAYO STAND STRL (DRAPES) ×2 IMPLANT
COVER SURGICAL LIGHT HANDLE (MISCELLANEOUS) ×2 IMPLANT
DECANTER SPIKE VIAL GLASS SM (MISCELLANEOUS) ×1 IMPLANT
DERMABOND ADVANCED (GAUZE/BANDAGES/DRESSINGS)
DERMABOND ADVANCED .7 DNX12 (GAUZE/BANDAGES/DRESSINGS) ×1 IMPLANT
DEVICE TROCAR PUNCTURE CLOSURE (ENDOMECHANICALS) ×1 IMPLANT
DRAPE C-ARM 42X72 X-RAY (DRAPES) ×2 IMPLANT
DRAPE UTILITY 15X26 W/TAPE STR (DRAPE) ×4 IMPLANT
ELECT REM PT RETURN 9FT ADLT (ELECTROSURGICAL) ×2
ELECTRODE REM PT RTRN 9FT ADLT (ELECTROSURGICAL) ×1 IMPLANT
FILTER SMOKE EVAC LAPAROSHD (FILTER) ×1 IMPLANT
GLOVE BIO SURGEON STRL SZ7 (GLOVE) ×1 IMPLANT
GLOVE BIO SURGEON STRL SZ7.5 (GLOVE) ×1 IMPLANT
GLOVE BIOGEL PI IND STRL 7.0 (GLOVE) IMPLANT
GLOVE BIOGEL PI IND STRL 7.5 (GLOVE) IMPLANT
GLOVE BIOGEL PI INDICATOR 7.0 (GLOVE) ×1
GLOVE BIOGEL PI INDICATOR 7.5 (GLOVE) ×2
GLOVE EUDERMIC 7 POWDERFREE (GLOVE) ×3 IMPLANT
GOWN STRL NON-REIN LRG LVL3 (GOWN DISPOSABLE) ×4 IMPLANT
GOWN STRL REIN XL XLG (GOWN DISPOSABLE) ×3 IMPLANT
KIT BASIN OR (CUSTOM PROCEDURE TRAY) ×2 IMPLANT
KIT ROOM TURNOVER OR (KITS) ×2 IMPLANT
NS IRRIG 1000ML POUR BTL (IV SOLUTION) ×2 IMPLANT
PAD ARMBOARD 7.5X6 YLW CONV (MISCELLANEOUS) ×2 IMPLANT
POUCH SPECIMEN RETRIEVAL 10MM (ENDOMECHANICALS) ×2 IMPLANT
SCISSORS LAP 5X35 DISP (ENDOMECHANICALS) ×2 IMPLANT
SET CHOLANGIOGRAPH 5 50 .035 (SET/KITS/TRAYS/PACK) ×2 IMPLANT
SET IRRIG TUBING LAPAROSCOPIC (IRRIGATION / IRRIGATOR) ×2 IMPLANT
SLEEVE ENDOPATH XCEL 5M (ENDOMECHANICALS) ×2 IMPLANT
SPECIMEN JAR SMALL (MISCELLANEOUS) ×2 IMPLANT
SUT MNCRL AB 4-0 PS2 18 (SUTURE) ×2 IMPLANT
SUT VICRYL 0 UR6 27IN ABS (SUTURE) ×1 IMPLANT
TOWEL OR 17X24 6PK STRL BLUE (TOWEL DISPOSABLE) ×2 IMPLANT
TOWEL OR 17X26 10 PK STRL BLUE (TOWEL DISPOSABLE) ×2 IMPLANT
TOWEL OR NON WOVEN STRL DISP B (DISPOSABLE) ×2 IMPLANT
TRAY LAPAROSCOPIC (CUSTOM PROCEDURE TRAY) ×2 IMPLANT
TROCAR XCEL BLUNT TIP 100MML (ENDOMECHANICALS) ×1 IMPLANT
TROCAR XCEL NON-BLD 11X100MML (ENDOMECHANICALS) ×3 IMPLANT
TROCAR XCEL NON-BLD 5MMX100MML (ENDOMECHANICALS) ×2 IMPLANT

## 2012-12-08 NOTE — Progress Notes (Signed)
Cholecystitis, acute  Assessment: Ready for surgery  Plan: ToOR this AM. Reviewed the plans again with her and she has no other questions   Subjective: Feels OK this am, was short of breath yesterday PM, but OK now. Still some right UQ pain  Objective: Vital signs in last 24 hours: Temp:  [97.5 F (36.4 C)-97.7 F (36.5 C)] 97.6 F (36.4 C) (12/02 0629) Pulse Rate:  [61-70] 61 (12/02 0629) Resp:  [16-18] 18 (12/02 0629) BP: (118-150)/(62-80) 145/80 mmHg (12/02 0629) SpO2:  [96 %-98 %] 96 % (12/02 0629) Weight:  [160 lb 15 oz (73 kg)] 160 lb 15 oz (73 kg) (12/02 0629) Last BM Date: 12/07/12  Intake/Output from previous day: 12/01 0701 - 12/02 0700 In: 4812.5 [P.O.:480; I.V.:3232.5; IV Piggyback:1100] Out: -   General appearance: alert, cooperative and no distress Resp: clear to auscultation bilaterally GI: Mild right UQ tenderness persists, no change fromyesterday  Lab Results:  No results found for this or any previous visit (from the past 24 hour(s)).   Studies/Results Radiology     MEDS, Scheduled . ampicillin-sulbactam (UNASYN) IV  3 g Intravenous Q6H  . pantoprazole (PROTONIX) IV  40 mg Intravenous QHS       LOS: 4 days    Currie Paris, MD, [facility], [place] [phone]   12/08/2012 7:33 AM

## 2012-12-08 NOTE — Anesthesia Procedure Notes (Signed)
Procedure Name: Intubation Date/Time: 12/08/2012 9:08 AM Performed by: Whitman Hero Pre-anesthesia Checklist: Patient identified, Emergency Drugs available, Suction available and Patient being monitored Patient Re-evaluated:Patient Re-evaluated prior to inductionOxygen Delivery Method: Circle system utilized Preoxygenation: Pre-oxygenation with 100% oxygen Intubation Type: IV induction Ventilation: Mask ventilation without difficulty Laryngoscope Size: Mac and 3 Grade View: Grade I Tube type: Oral Tube size: 7.5 mm Number of attempts: 1 Airway Equipment and Method: Stylet Placement Confirmation: ETT inserted through vocal cords under direct vision,  breath sounds checked- equal and bilateral and positive ETCO2 Secured at: 21 cm Tube secured with: Tape Dental Injury: Teeth and Oropharynx as per pre-operative assessment

## 2012-12-08 NOTE — Op Note (Signed)
Kimberly [name] December 04, 1950 [id number] 12/08/2012   Preoperative diagnosis: Biliary colic and chronic calculus cholecystitis; history of abdominal adhesions and obstruction  Postoperative diagnosis: Chronic calculus cholecystitis; extensive intra-abdominal adhesions  Procedure: Laparoscopic cholecystectomy with intraoperative Cholangiogram; laparoscopic lysis of adhesions to gain exposure for the cholecystectomy (30 minutes) Surgeon: Currie Paris, MD, FACS  Assistant: Dr. Claud Kelp  Anesthesia: General   Clinical History and Indications: This patient was admitted a few days ago with biliary symptoms and gallstones and a slightly thickened gallbladder wall. We had to delay surgery as she has been on Plavix. Her symptoms have improved somewhat. She is brought to the operating room today for cholecystectomy. She's had extensive prior abdominal surgeries so we discussed the possibility of this needing to be done open, with plan to start laparoscopically.    Description of Procedure: The patient was seen in the preoperative area, the plan is confirmed, and she taken to the operating room. After satisfactory general endotracheal anesthesia was obtained the abdomen was prepped and draped in a time out was done.  Using a 5 mm port and the Optiview cannula I was able to enter the abdomen in the right subcostal area about 2 fingerbreadths below the costal margin and fairly lateral. The abdomen was insufflated. There multiple adhesions but I could see a free area just to the right of the upper midline near the liver edge. We were safely able to place a 10 mm port under direct vision at that location. With the camera placed in the midline port I was able to see a free area lateral and inferior to the initial trocar in place a 5 mm trocar under direct vision there. Most of the adhesions between the 2 trochars in the midline were very flimsy. There was a loop of small bowel tethered into the right  lower quadrant well away from where we were working. I then used sharp dissection to take down the flimsy omental adhesions to the midline and right upper quadrant. Once these were done I was able to safely place another 10 mm port through the old scar just to the right of the umbilicus.  With the camera in the umbilical port site I was able to then take multiple adhesions down over the dome of the gallbladder until I could identify the gallbladder. I was able to strip other adhesions off of the gallbladder and like it down near the gallbladder ampulla. I was able to open the peritoneum and identify a long segment of cystic duct and cystic artery. I had a large window behind them as I freed up a lot of the gallbladder from the liver bed.  A Cook catheter was then introduced percutaneously and intraoperative cholangiography performed. The common duct was somewhat large, but there was flow of dye into the duodenum and no filling defects and good filling of the hepatic radicals. The [facility] catheter was removed and 3 clips placed on the cystic duct and the duct was then divided. 3 clips were placed on the artery and it was divided. A small partial branch was clipped and divided. The gallbladder was removed from below to above the coagulation current of the cautery. It was then placed in the bag. I spent several minutes irrigating to make sure everything was dry. There is no evidence Of any bowel injury. The left lower quadrant was able to be inspected and appeared that the anterior abdominal wall was free of significant adhesions in that area. There is no evidence that  there had been any other intra-abdominal process an inflammatory nature occurring.  The gallbladder was then removed through the umbilical site. I then used an Endo close device to put a suture and close this site. Final check for hemostasis and irrigation was done through the lateral ports which were then removed. The abdomen was deflated through the  epigastric port and it was removed. Skin was closed with 4-0 Monocryl subcuticular plus Dermabond.  The patient tolerated the procedure well. There are no operative complications. Counts were correct. Blood loss was minimal. Currie Paris, MD, FACS 12/08/2012 10:38 AM

## 2012-12-08 NOTE — Anesthesia Postprocedure Evaluation (Signed)
Anesthesia Post Note  Patient: Kimberly [name]  Procedure(s) Performed: Procedure(s) (LRB): LAPAROSCOPIC CHOLECYSTECTOMY WITH INTRAOPERATIVE CHOLANGIOGRAM (N/A)  Anesthesia type: general  Patient location: PACU  Post pain: Pain level controlled  Post assessment: Patient's Cardiovascular Status Stable  Last Vitals:  Filed Vitals:   12/08/12 1353  BP: 111/63  Pulse: 57  Temp: 36.7 C  Resp: 16    Post vital signs: Reviewed and stable  Level of consciousness: sedated  Complications: No apparent anesthesia complications

## 2012-12-08 NOTE — Anesthesia Preprocedure Evaluation (Signed)
Anesthesia Evaluation  Patient identified by MRN, date of birth, ID band Patient awake    Reviewed: Allergy & Precautions, H&P , NPO status , Patient's Chart, lab work & pertinent test results  Airway Mallampati: I TM Distance: >3 FB Neck ROM: Full    Dental   Pulmonary asthma , Current Smoker,          Cardiovascular + Peripheral Vascular Disease     Neuro/Psych    GI/Hepatic   Endo/Other    Renal/GU      Musculoskeletal   Abdominal   Peds  Hematology   Anesthesia Other Findings   Reproductive/Obstetrics                           Anesthesia Physical Anesthesia Plan  ASA: III  Anesthesia Plan: General   Post-op Pain Management:    Induction: Intravenous  Airway Management Planned: Oral ETT  Additional Equipment:   Intra-op Plan:   Post-operative Plan: Extubation in OR  Informed Consent: I have reviewed the patients History and Physical, chart, labs and discussed the procedure including the risks, benefits and alternatives for the proposed anesthesia with the patient or authorized representative who has indicated his/her understanding and acceptance.     Plan Discussed with: CRNA and Surgeon  Anesthesia Plan Comments:         Anesthesia Quick Evaluation

## 2012-12-08 NOTE — Transfer of Care (Signed)
Immediate Anesthesia Transfer of Care Note  Patient: Kimberly [name]  Procedure(s) Performed: Procedure(s): LAPAROSCOPIC CHOLECYSTECTOMY WITH INTRAOPERATIVE CHOLANGIOGRAM (N/A)  Patient Location: PACU  Anesthesia Type:General  Level of Consciousness: awake, alert  and oriented  Airway & Oxygen Therapy: Patient Spontanous Breathing and Patient connected to face mask oxygen  Post-op Assessment: Report given to PACU RN, Post -op Vital signs reviewed and stable and Patient moving all extremities  Post vital signs: Reviewed and stable  Complications: No apparent anesthesia complications

## 2012-12-09 ENCOUNTER — Encounter (HOSPITAL_COMMUNITY): Payer: Self-pay | Admitting: Surgery

## 2012-12-09 LAB — CBC
HCT: 37.7 % (ref 36.0–46.0)
Hemoglobin: 12.8 g/dL (ref 12.0–15.0)
MCH: 33.3 pg (ref 26.0–34.0)
MCHC: 34 g/dL (ref 30.0–36.0)
MCV: 98.2 fL (ref 78.0–100.0)
Platelets: 221 10*3/uL (ref 150–400)
RBC: 3.84 MIL/uL — ABNORMAL LOW (ref 3.87–5.11)
RDW: 13.4 % (ref 11.5–15.5)
WBC: 13.6 10*3/uL — ABNORMAL HIGH (ref 4.0–10.5)

## 2012-12-09 LAB — COMPREHENSIVE METABOLIC PANEL
ALT: 55 U/L — ABNORMAL HIGH (ref 0–35)
AST: 49 U/L — ABNORMAL HIGH (ref 0–37)
Albumin: 3.1 g/dL — ABNORMAL LOW (ref 3.5–5.2)
Alkaline Phosphatase: 77 U/L (ref 39–117)
BUN: 7 mg/dL (ref 6–23)
CO2: 25 mEq/L (ref 19–32)
Calcium: 8.6 mg/dL (ref 8.4–10.5)
Chloride: 102 mEq/L (ref 96–112)
Creatinine, Ser: 0.78 mg/dL (ref 0.50–1.10)
GFR calc Af Amer: >90 mL/min (ref >90)
GFR calc non Af Amer: 88 mL/min — ABNORMAL LOW (ref >90)
Glucose, Bld: 114 mg/dL — ABNORMAL HIGH (ref 70–99)
Potassium: 4.1 mEq/L (ref 3.5–5.1)
Sodium: 138 mEq/L (ref 135–145)
Total Bilirubin: 0.4 mg/dL (ref 0.3–1.2)
Total Protein: 6.1 g/dL (ref 6.0–8.3)

## 2012-12-09 LAB — GLUCOSE, CAPILLARY
Glucose-Capillary: 133 mg/dL — ABNORMAL HIGH (ref 70–99)
Glucose-Capillary: 146 mg/dL — ABNORMAL HIGH (ref 70–99)

## 2012-12-09 MED ORDER — KETOROLAC TROMETHAMINE 30 MG/ML IJ SOLN
30.0000 mg | Freq: Four times a day (QID) | INTRAMUSCULAR | Status: DC
  Administered 2012-12-09 – 2012-12-10 (×4): 30 mg via INTRAVENOUS
  Filled 2012-12-09 (×8): qty 1

## 2012-12-09 MED ORDER — METHOCARBAMOL 500 MG PO TABS
1000.0000 mg | ORAL_TABLET | Freq: Three times a day (TID) | ORAL | Status: DC | PRN
  Filled 2012-12-09: qty 2

## 2012-12-09 MED ORDER — ACETAMINOPHEN 325 MG PO TABS
650.0000 mg | ORAL_TABLET | Freq: Four times a day (QID) | ORAL | Status: DC
  Administered 2012-12-09 – 2012-12-10 (×4): 650 mg via ORAL
  Filled 2012-12-09 (×5): qty 2

## 2012-12-09 MED ORDER — HYDROMORPHONE HCL PF 1 MG/ML IJ SOLN: 0.5000 mg | INTRAMUSCULAR | Status: DC | PRN

## 2012-12-09 NOTE — Progress Notes (Signed)
Encourage ambulation with patient. "I don't feel like it at this time. I sat up for 2 hrs this morning and I'm tired." Explained importance of returning to baseline ADLs. Patient agreed to ambulate later this evening. Remains in bed. Will continue to monitor.Kimberly [name]

## 2012-12-09 NOTE — Progress Notes (Signed)
Patient called out. "I took my own Motrin because I was tired of waiting on pain medication. Patient instructed not to take any further medications that are not given per hospital staff. Call bell near. Staff phone numbers given for patient use.Kimberly [name]

## 2012-12-09 NOTE — Progress Notes (Signed)
Agree with A&P of MD,PA. Patient more comfortable this PM. Main pain around midline incision. She has no nausea and ate peach cobbler from [employer] this morning

## 2012-12-09 NOTE — Progress Notes (Signed)
1 Day Post-Op  Subjective: Pt c/o a lot of pain, screams in pain with movement OOB.  Hasn't walked much yet.  Hungry ate some grits, wants regular food.  +flatus, urinating well.    Objective: Vital signs in last 24 hours: Temp:  [97.6 F (36.4 C)-98.3 F (36.8 C)] 98 F (36.7 C) (12/03 0527) Pulse Rate:  [57-83] 61 (12/03 0527) Resp:  [10-18] 18 (12/03 0527) BP: (103-150)/(53-69) 103/56 mmHg (12/03 0527) SpO2:  [93 %-100 %] 93 % (12/03 0527) Last BM Date: 12/07/12  Intake/Output from previous day: 12/02 0701 - 12/03 0700 In: 1140 [P.O.:240; I.V.:900] Out: -  Intake/Output this shift:    PE: Gen:  Alert, NAD, significant discomfort/frustration Abd: Soft, moderate tenderness diffusely, mild distension, +BS, no HSM, incisions C/D/I  Lab Results:   Recent Labs  12/09/12 0510  WBC 13.6*  HGB 12.8  HCT 37.7  PLT 221   BMET  Recent Labs  12/09/12 0510  NA 138  K 4.1  CL 102  CO2 25  GLUCOSE 114*  BUN 7  CREATININE 0.78  CALCIUM 8.6   PT/INR No results found for this basename: LABPROT, INR,  in the last 72 hours CMP     Component Value Date/Time   NA 138 12/09/2012 0510   K 4.1 12/09/2012 0510   CL 102 12/09/2012 0510   CO2 25 12/09/2012 0510   GLUCOSE 114* 12/09/2012 0510   BUN 7 12/09/2012 0510   CREATININE 0.78 12/09/2012 0510   CALCIUM 8.6 12/09/2012 0510   PROT 6.1 12/09/2012 0510   ALBUMIN 3.1* 12/09/2012 0510   AST 49* 12/09/2012 0510   ALT 55* 12/09/2012 0510   ALKPHOS 77 12/09/2012 0510   BILITOT 0.4 12/09/2012 0510   GFRNONAA 88* 12/09/2012 0510   GFRAA >90 12/09/2012 0510   Lipase     Component Value Date/Time   LIPASE 32 12/04/2012 2200       Studies/Results: Dg Cholangiogram Operative  12/08/2012   CLINICAL DATA:  Acute cholecystitis  EXAM: INTRAOPERATIVE CHOLANGIOGRAM  TECHNIQUE: Cholangiographic images from the C-arm fluoroscopic device were submitted for interpretation post-operatively. Please see the procedural report for the amount of  contrast and the fluoroscopy time utilized.  COMPARISON:  12/04/2012  FINDINGS: Contrast fills the biliary tree and duodenum compatible with patency. The mid and upper common bile duct is dilated. The distal common bile duct is narrowed and irregular. Stricture is not excluded. No filling defects are present to suggest duct stones.  IMPRESSION: Patent biliary system. The distal common bile duct is irregular and narrowed. Benign or malignant stricture are not excluded.   Electronically Signed   By: Maryclare Bean M.D.   On: 12/08/2012 17:04    Anti-infectives: Anti-infectives   Start     Dose/Rate Route Frequency Ordered Stop   12/05/12 0500  [MAR Hold]  Ampicillin-Sulbactam (UNASYN) 3 g in sodium chloride 0.9 % 100 mL IVPB  Status:  Discontinued     (On MAR Hold since 12/08/12 0819)   3 g 100 mL/hr over 60 Minutes Intravenous Every 6 hours 12/05/12 0340 12/08/12 1158   12/05/12 0115  cefTRIAXone (ROCEPHIN) 1 g in dextrose 5 % 50 mL IVPB     1 g 100 mL/hr over 30 Minutes Intravenous  Once 12/05/12 0107 12/05/12 0218       Assessment/Plan Acute cholecystitis POD #1 s/p lap chole with IOC, LOA Leukocytosis - likely reactive  Plan: 1.  Advance diet 2.  Pain control, reduce dilaudid frequency, add toradol  robaxin tylenol, and ice pack 3.  Ambulate OOB, IS 4.  SCD's and heparin 5.  Hopefully home tomorrow, today if improves significantly    LOS: 5 days    [name], Kimberly Bessler 12/09/2012, 8:50 AM Pager: [phone]

## 2012-12-10 MED ORDER — PANTOPRAZOLE SODIUM 40 MG PO TBEC: 40.0000 mg | DELAYED_RELEASE_TABLET | Freq: Every day | ORAL | Status: DC

## 2012-12-10 NOTE — Progress Notes (Signed)
Patient ambulated 50 ft outside of room using rolling walker. Returned to room d/t pain. Patient requested to sit on side of bed for awhile. Bed alarm was placed. Patient assisted back in bed after 45 min. Encouraged more ambulation and use of I/S; patient agreed.Mamie Levers

## 2012-12-10 NOTE — Discharge Summary (Signed)
Patient seems better today and she was dressed and ready to go when I came in. Discussed f/u with her.

## 2012-12-10 NOTE — Discharge Summary (Signed)
Patient ID: Kimberly [name] MRN: [id number] DOB/AGE: 02-18-1950 62 y.o.  Admit date: 12/04/2012 Discharge date: 12/10/2012  Procedures: lap chole with IOC and LOA  Consults: None  Reason for Admission: 10 yof with extensive peripheral vascular history who is still actively smoking presents with acute onset tonight of first episode of epigastric/chest pain radiating straight to her back. Associated with some nausea. She thought she was having MI and came to hospital. She has some baseline nausea but states never this pain before. Her pain is still present on my evaluation. She has undergone a ct to rule out dissection which was negative. This showed some nonspecific abnormalities and was followed by an u/s that shows stones. She points to her ruq when asked where the pain is now. She took her plavix and aspirin today. She has ekg that is close to baseline and negative troponin.  She has significant abdominal surgical history including aortic bifem bypass and then loa for sbo.  Admission Diagnoses:  1. Cholecystitis Patient Active Problem List   Diagnosis Date Noted  . Cholecystitis, acute 12/05/2012  . Weakness 06/29/2012  . Pain in limb-Left neck,shoulder and arm 06/29/2012  . Aftercare following surgery of the circulatory system, NEC 12/09/2011  . Subclavian arterial stenosis 05/27/2011  . Small bowel obstruction due to adhesions.  Enterolysis 04/10/2011. 05/02/2011  . Pain in the abdomen 04/08/2011  . Ileus 04/08/2011  . PVD (peripheral vascular disease) 04/08/2011  . 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 Occlusion and stenosis of carotid artery without mention of cerebral infarction 03/25/2011  . Atherosclerosis of native arteries of the extremities with intermittent claudication 03/14/2011  . PVD (peripheral vascular disease) with claudication 02/06/2011  . Subclavian artery stenosis 01/28/2011  . PAP SMEAR, LGSIL, ABNORMAL 03/30/2010  . HYPERCHOLESTEROLEMIA 03/28/2010  .  INTERMITTENT CLAUDICATION 03/27/2010  . CYSTITIS, ACUTE 03/27/2010  . LYMPHADENOPATHY 02/06/2010  . TOBACCO ABUSE 12/07/2009  . DENTAL PAIN 12/07/2009  . MENOPAUSE, SURGICAL 01/07/2005    Hospital Course: the patient was admitted and taken off of her plavix and ASA.  After several days off of these medications, she was taken to the OR on Tuesday 12-08-12 for a lap chole.  She tolerated this well.  The following day she was having quite a bit of pain.  Her pain medications were adjusted on POD1 and she was encouraged to ambulate.  By POD2, she was tolerating a regular diet and her pain was well controlled.  She was stable for dc home.  PE: Abd: soft, minimally tender, +BS, ND, incisions c/d/i with dermabond  Discharge Diagnoses:  Principal Problem:   Cholecystitis, acute s/p lap chole with IOC and LOA Patient Active Problem List   Diagnosis Date Noted  . Cholecystitis, acute 12/05/2012  . Weakness 06/29/2012  . Pain in limb-Left neck,shoulder and arm 06/29/2012  . Aftercare following surgery of the circulatory system, NEC 12/09/2011  . Subclavian arterial stenosis 05/27/2011  . Small bowel obstruction due to adhesions.  Enterolysis 04/10/2011. 05/02/2011  . Pain in the abdomen 04/08/2011  . Ileus 04/08/2011  . PVD (peripheral vascular disease) 04/08/2011  . 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 Occlusion and stenosis of carotid artery without mention of cerebral infarction 03/25/2011  . Atherosclerosis of native arteries of the extremities with intermittent claudication 03/14/2011  . PVD (peripheral vascular disease) with claudication 02/06/2011  . Subclavian artery stenosis 01/28/2011  . PAP SMEAR, LGSIL, ABNORMAL 03/30/2010  . HYPERCHOLESTEROLEMIA 03/28/2010  . INTERMITTENT CLAUDICATION 03/27/2010  . CYSTITIS, ACUTE 03/27/2010  .  LYMPHADENOPATHY 02/06/2010  . TOBACCO ABUSE 12/07/2009  . DENTAL PAIN 12/07/2009  . MENOPAUSE, SURGICAL 01/07/2005    Discharge Medications:    Medication List         acetaminophen 325 MG tablet  Commonly known as:  TYLENOL  Take 650 mg by mouth every 6 (six) hours as needed for pain.     aspirin EC 81 MG tablet  Take 162 mg by mouth daily.     clopidogrel 75 MG tablet  Commonly known as:  PLAVIX  Take 75 mg by mouth daily.     ibuprofen 200 MG tablet  Commonly known as:  ADVIL,MOTRIN  Take 400-600 mg by mouth every 6 (six) hours as needed for pain.     OVER THE COUNTER MEDICATION  Place 1 application into both nostrils daily as needed (congestion).     polyethylene glycol packet  Commonly known as:  MIRALAX / GLYCOLAX  Take 17 g by mouth daily as needed (constipation).     traMADol 50 MG tablet  Commonly known as:  ULTRAM  Take 100 mg by mouth every 8 (eight) hours as needed for pain.        Discharge Instructions:     Follow-up Information   Follow up with [facility] On 01/05/2013. (1:30pm, arrive by 1:00pm for paperwork)    Contact information:   [address]   [place] [address] [phone]       Signed: Letha Cape 12/10/2012, 9:31 AM

## 2013-01-02 ENCOUNTER — Emergency Department (HOSPITAL_COMMUNITY)
Admission: EM | Admit: 2013-01-02 | Discharge: 2013-01-02 | Disposition: A | Discharge status: Home / Self Care | Payer: Medicaid Other | Attending: Emergency Medicine | Admitting: Emergency Medicine

## 2013-01-02 ENCOUNTER — Encounter (HOSPITAL_COMMUNITY): Payer: Self-pay | Admitting: Emergency Medicine

## 2013-01-02 DIAGNOSIS — F329 Major depressive disorder, single episode, unspecified: Secondary | ICD-10-CM | POA: Insufficient documentation

## 2013-01-02 DIAGNOSIS — Z8542 Personal history of malignant neoplasm of other parts of uterus: Secondary | ICD-10-CM | POA: Insufficient documentation

## 2013-01-02 DIAGNOSIS — Z8679 Personal history of other diseases of the circulatory system: Secondary | ICD-10-CM | POA: Insufficient documentation

## 2013-01-02 DIAGNOSIS — I209 Angina pectoris, unspecified: Secondary | ICD-10-CM | POA: Insufficient documentation

## 2013-01-02 DIAGNOSIS — F3289 Other specified depressive episodes: Secondary | ICD-10-CM | POA: Insufficient documentation

## 2013-01-02 DIAGNOSIS — F17200 Nicotine dependence, unspecified, uncomplicated: Secondary | ICD-10-CM | POA: Insufficient documentation

## 2013-01-02 DIAGNOSIS — IMO0001 Reserved for inherently not codable concepts without codable children: Secondary | ICD-10-CM | POA: Insufficient documentation

## 2013-01-02 DIAGNOSIS — Z7902 Long term (current) use of antithrombotics/antiplatelets: Secondary | ICD-10-CM | POA: Insufficient documentation

## 2013-01-02 DIAGNOSIS — Z7982 Long term (current) use of aspirin: Secondary | ICD-10-CM | POA: Insufficient documentation

## 2013-01-02 DIAGNOSIS — Z8639 Personal history of other endocrine, nutritional and metabolic disease: Secondary | ICD-10-CM | POA: Insufficient documentation

## 2013-01-02 DIAGNOSIS — Z862 Personal history of diseases of the blood and blood-forming organs and certain disorders involving the immune mechanism: Secondary | ICD-10-CM | POA: Insufficient documentation

## 2013-01-02 DIAGNOSIS — J45901 Unspecified asthma with (acute) exacerbation: Secondary | ICD-10-CM | POA: Insufficient documentation

## 2013-01-02 DIAGNOSIS — J029 Acute pharyngitis, unspecified: Secondary | ICD-10-CM | POA: Insufficient documentation

## 2013-01-02 DIAGNOSIS — F411 Generalized anxiety disorder: Secondary | ICD-10-CM | POA: Insufficient documentation

## 2013-01-02 DIAGNOSIS — Z8719 Personal history of other diseases of the digestive system: Secondary | ICD-10-CM | POA: Insufficient documentation

## 2013-01-02 LAB — MONONUCLEOSIS SCREEN: Mono Screen: NEGATIVE

## 2013-01-02 LAB — RAPID STREP SCREEN (MED CTR MEBANE ONLY): Streptococcus, Group A Screen (Direct): NEGATIVE

## 2013-01-02 MED ORDER — HYDROCODONE-ACETAMINOPHEN 7.5-325 MG/15ML PO SOLN: 10.0000 mL | Freq: Four times a day (QID) | ORAL | Status: DC | PRN

## 2013-01-02 NOTE — ED Notes (Signed)
Pt. Stated, Kimberly [name] been having a sore throat for about a week and half

## 2013-01-02 NOTE — ED Provider Notes (Signed)
CSN: [id number]     Arrival date & time 01/02/13  1235 History   First MD Initiated Contact with Patient 01/02/13 1317    This chart was scribed for Trixie Dredge PA-C, a non-physician practitioner working with Ethelda Chick, MD by Lewanda Rife, ED Scribe. This patient was seen in room TR09C/TR09C and the patient's care was started at 2:02 PM     Chief Complaint  Patient presents with  . Sore Throat   (Consider location/radiation/quality/duration/timing/severity/associated sxs/prior Treatment) The history is provided by the patient. No language interpreter was used.   HPI Comments: Kimberly [name] is a 62 y.o. female who presents to the Emergency Department complaining of constant moderate sore throat onset 1.5 weeks. Reports associated blisters on distal tongue, mild fatigue, and productive cough with phlegm. Denies any aggravating factors. Reports trying teas and cold fluids with moderate relief of symptoms. Reports trying Theraflu, aleve, and aspirin with no relief of symptoms. Denies associated fever, chills, abdominal pain, generalized myalgias, sick contacts, recent travels, nausea, emesis, chest pain, and shortness of breath.    Past Medical History  Diagnosis Date  . Asthma   . Hyperlipidemia   . History of lead poisoning 1977  . Shortness of breath   . Peripheral vascular disease     claudication  right greater than left  femoral artery  . Anxiety   . Blood transfusion   . Migraine   . Subclavian artery disease     left  . Small bowel obstruction, partial 04/08/11  . Angina     March 2013  . Depression   . Uterine cancer     Pre cervical  . Fibromyalgia    Past Surgical History  Procedure Laterality Date  . Abdominal angiogram    . Aorta - bilateral femoral artery bypass graft  02/26/2011    Procedure: AORTA BIFEMORAL BYPASS GRAFT;  Surgeon: Juleen China, MD;  Location: [facility] OR;  Service: Vascular;  Laterality: N/A;  . Pr vein bypass graft,aorto-fem-pop  02/26/11   . Laparotomy  04/10/2011    Procedure: EXPLORATORY LAPAROTOMY;  Surgeon: Kandis Cocking, MD;  Location: [facility] OR;  Service: General;  Laterality: N/A;  ENTEROLYSIS OF ADHESIONS  . Abdominal hysterectomy    . Endarterectomy  06/13/2011    Procedure: ENDARTERECTOMY CAROTID;  Surgeon: Nada Libman, MD;  Location[facility] OR;  Service: Vascular;  Laterality: Left;  Left carotid artery endarterectomy with vascu-guard patch angioplasty  . Carotid endarterectomy  06/13/2011    Left  CEA  . Lower extremity angiogram Left 06/24/12    Left stent intervention, left subclavian  . Cholecystectomy N/A 12/08/2012    Procedure: LAPAROSCOPIC CHOLECYSTECTOMY WITH INTRAOPERATIVE CHOLANGIOGRAM;  Surgeon: Currie Paris, MD;  Location: [facility] OR;  Service: General;  Laterality: N/A;   Family History  Problem Relation Age of Onset  . Cancer Mother   . Anesthesia problems Neg Hx   . Cancer Brother   . Cancer Other   . Stroke Other    History  Substance Use Topics  . Smoking status: Current Some Day Smoker -- 0.05 packs/day for 45 years    Types: Cigarettes    Last Attempt to Quit: 02/24/2011  . Smokeless tobacco: Never Used     Comment: pt states that she smokes about 6 cigs per day  . Alcohol Use: No   OB History   Grav Para Term Preterm Abortions TAB SAB Ect Mult Living  Review of Systems  Constitutional: Negative for fever.  HENT: Positive for sore throat. Negative for trouble swallowing.   Respiratory: Positive for cough.   Cardiovascular: Negative for chest pain.  Gastrointestinal: Negative for vomiting, abdominal pain and diarrhea.  Psychiatric/Behavioral: Negative for confusion.    Allergies  Other and Codeine  Home Medications   Current Outpatient Rx  Name  Route  Sig  Dispense  Refill  . acetaminophen (TYLENOL) 325 MG tablet   Oral   Take 650 mg by mouth every 6 (six) hours as needed for pain.         Marland Kitchen aspirin EC 81 MG tablet   Oral   Take 162 mg by mouth daily.           . clopidogrel (PLAVIX) 75 MG tablet   Oral   Take 75 mg by mouth daily.         Marland Kitchen ibuprofen (ADVIL,MOTRIN) 200 MG tablet   Oral   Take 400-600 mg by mouth every 6 (six) hours as needed for pain.          Marland Kitchen OVER THE COUNTER MEDICATION   Each Nare   Place 1 application into both nostrils daily as needed (congestion).         . polyethylene glycol (MIRALAX / GLYCOLAX) packet   Oral   Take 17 g by mouth daily as needed (constipation).          . traMADol (ULTRAM) 50 MG tablet   Oral   Take 100 mg by mouth every 8 (eight) hours as needed for pain.           BP 142/81  Pulse 71  Temp(Src) 97.6 F (36.4 C) (Oral)  Resp 16  Ht 5\' 4"  (1.626 m)  Wt 160 lb (72.576 kg)  BMI 27.45 kg/m2  SpO2 97% Physical Exam  Nursing note and vitals reviewed. Constitutional: She appears well-developed and well-nourished. No distress.  HENT:  Head: Normocephalic and atraumatic.  Mouth/Throat: Uvula is midline and mucous membranes are normal. Mucous membranes are not dry. No trismus in the jaw. No uvula swelling. Posterior oropharyngeal edema and posterior oropharyngeal erythema present. No oropharyngeal exudate or tonsillar abscesses.  No blisters or other oral lesions noted  Neck: Neck supple.  Cardiovascular: Normal rate and regular rhythm.   Pulmonary/Chest: Effort normal and breath sounds normal. No stridor. No respiratory distress. She has no wheezes. She has no rales.  Abdominal: Soft. She exhibits no distension. There is no tenderness. There is no rebound and no guarding.  Well-healing surgical incision over RUQ   Lymphadenopathy:    She has cervical adenopathy.  Neurological: She is alert.  Skin: She is not diaphoretic.    ED Course  Procedures COORDINATION OF CARE:  Nursing notes reviewed. Vital signs reviewed. Initial pt interview and examination performed.   2:02 PM-Discussed work up plan with pt at bedside, which includes rapid strep screen, strep culture, and  mononucleosis testing. Pt agrees with plan. 2:02 PM Nursing Notes Reviewed/ Care Coordinated Interpretation of Laboratory Data incorporated into ED treatment Discussed results and treatment plan with pt. Pt demonstrates understanding and agrees with plan.   Treatment plan initiated:Medications - No data to display   Initial diagnostic testing ordered.    Labs Review Labs Reviewed  RAPID STREP SCREEN  CULTURE, GROUP A STREP  MONONUCLEOSIS SCREEN   Imaging Review No results found.  EKG Interpretation   None      4:05 PM I spoke with lab twice,  mono test was overlooked at change of shift.  No pending.    MDM  No diagnosis found.  Pt with sore throat x 10 days and fatigue, also with cough.  Lungs CTAB - doubt pneumonia.  Strep screen is negative.  Culture pending.  Mono test pending at change of shift.  Pt states she is unable to fill presciptions.  No airway concerns.  Discussed result, findings, treatment, and follow up  with patient.  Pt given return precautions.  Pt verbalizes understanding and agrees with plan.      Discussed pt with Elpidio Anis, PA-C, at change of shift.  Pending mono test.     I personally performed the services described in this documentation, which was scribed in my presence. The recorded information has been reviewed and is accurate.    Trixie Dredge, PA-C 01/02/13 1609

## 2013-01-02 NOTE — ED Provider Notes (Signed)
Medical screening examination/treatment/procedure(s) were performed by non-physician practitioner and as supervising physician I was immediately available for consultation/collaboration.  EKG Interpretation   None        Maritza Hosterman K Linker, MD 01/02/13 1612 

## 2013-01-04 LAB — CULTURE, GROUP A STREP

## 2013-01-05 ENCOUNTER — Encounter (INDEPENDENT_AMBULATORY_CARE_PROVIDER_SITE_OTHER): Payer: Self-pay

## 2013-01-05 ENCOUNTER — Ambulatory Visit (INDEPENDENT_AMBULATORY_CARE_PROVIDER_SITE_OTHER): Payer: Medicaid Other | Admitting: General Surgery

## 2013-01-05 VITALS — BP 114/72 | HR 80 | Temp 99.1°F | Resp 15 | Ht 64.0 in | Wt 159.0 lb

## 2013-01-05 DIAGNOSIS — K811 Chronic cholecystitis: Secondary | ICD-10-CM

## 2013-01-05 NOTE — Patient Instructions (Signed)
Follow up as needed

## 2013-01-05 NOTE — Progress Notes (Signed)
Kimberly [name] 1950/11/18 [id number] 01/05/2013   Kimberly [name] is a 62 y.o. female who had a laparoscopic cholecystectomy with intraoperative cholangiogram by Dr. Jamey Ripa.  The pathology report confirmed chronic cholecystitis.  The patient reports that they are feeling well with normal bowel movements and decreased appetite.  The pre-operative symptoms of abdominal pain, nausea, and vomiting have resolved.  C/o sore throat and viral symptoms.  Physical examination - Incisions appear well-healed with no sign of infection or bleeding.   Abdomen - soft, non-tender  Impression:  s/p laparoscopic cholecystectomy  Plan:  She may resume a regular diet and full activity.  She may follow-up on a PRN basis. She is encouraged to take Boost or Ensure to help with additional caloric support while not eating much.  Obtain PCP to follow up for sore throat and viral symptoms

## 2013-01-08 ENCOUNTER — Encounter: Payer: Self-pay | Admitting: Family

## 2013-01-11 ENCOUNTER — Encounter: Payer: Self-pay | Admitting: Family

## 2013-01-11 ENCOUNTER — Ambulatory Visit (HOSPITAL_COMMUNITY)
Admission: RE | Admit: 2013-01-11 | Discharge: 2013-01-11 | Disposition: A | Discharge status: Home / Self Care | Payer: Medicaid Other | Source: Ambulatory Visit | Attending: Surgery | Admitting: Surgery

## 2013-01-11 ENCOUNTER — Ambulatory Visit (INDEPENDENT_AMBULATORY_CARE_PROVIDER_SITE_OTHER)
Admission: RE | Admit: 2013-01-11 | Discharge: 2013-01-11 | Disposition: A | Discharge status: Home / Self Care | Payer: Medicaid Other | Source: Ambulatory Visit | Attending: Surgery | Admitting: Surgery

## 2013-01-11 ENCOUNTER — Ambulatory Visit (INDEPENDENT_AMBULATORY_CARE_PROVIDER_SITE_OTHER): Payer: Medicaid Other | Admitting: Family

## 2013-01-11 VITALS — BP 118/70 | HR 67 | Resp 16 | Ht 64.0 in | Wt 155.0 lb

## 2013-01-11 DIAGNOSIS — I739 Peripheral vascular disease, unspecified: Secondary | ICD-10-CM

## 2013-01-11 DIAGNOSIS — Z48812 Encounter for surgical aftercare following surgery on the circulatory system: Secondary | ICD-10-CM

## 2013-01-11 DIAGNOSIS — I6529 Occlusion and stenosis of unspecified carotid artery: Secondary | ICD-10-CM

## 2013-01-11 DIAGNOSIS — M79609 Pain in unspecified limb: Secondary | ICD-10-CM

## 2013-01-11 DIAGNOSIS — I658 Occlusion and stenosis of other precerebral arteries: Secondary | ICD-10-CM | POA: Insufficient documentation

## 2013-01-11 DIAGNOSIS — R209 Unspecified disturbances of skin sensation: Secondary | ICD-10-CM

## 2013-01-11 DIAGNOSIS — R200 Anesthesia of skin: Secondary | ICD-10-CM

## 2013-01-11 NOTE — Patient Instructions (Addendum)
Stroke Prevention Some medical conditions and behaviors are associated with an increased chance of having a stroke. You may prevent a stroke by making healthy choices and managing medical conditions. Reduce your risk of having a stroke by:  Staying physically active. Get at least 30 minutes of activity on most or all days.  Not smoking. It may also be helpful to avoid exposure to secondhand smoke.  Limiting alcohol use. Moderate alcohol use is considered to be:  No more than 2 drinks per day for men.  No more than 1 drink per day for nonpregnant women.  Eating healthy foods.  Include 5 or more servings of fruits and vegetables a day.  Certain diets may be prescribed to address high blood pressure, high cholesterol, diabetes, or obesity.  Managing your cholesterol levels.  A low-saturated fat, low-trans fat, low-cholesterol, and high-fiber diet may control cholesterol levels.  Take any prescribed medicines to control cholesterol as directed by your caregiver.  Managing your diabetes.  A controlled-carbohydrate, controlled-sugar diet is recommended to manage diabetes.  Take any prescribed medicines to control diabetes as directed by your caregiver.  Controlling your high blood pressure (hypertension).  A low-salt (sodium), low-saturated fat, low-trans fat, and low-cholesterol diet is recommended to manage high blood pressure.  Take any prescribed medicines to control hypertension as directed by your caregiver.  Maintaining a healthy weight.  A reduced-calorie, low-sodium, low-saturated fat, low-trans fat, low-cholesterol diet is recommended to manage weight.  Stopping drug abuse.  Avoiding birth control pills.  Talk to your caregiver about the risks of taking birth control pills if you are over 35 years old, smoke, get migraines, or have ever had a blood clot.  Getting evaluated for sleep disorders (sleep apnea).  Talk to your caregiver about getting a sleep evaluation  if you snore a lot or have excessive sleepiness.  Taking medicines as directed by your caregiver.  For some people, aspirin or blood thinners (anticoagulants) are helpful in reducing the risk of forming abnormal blood clots that can lead to stroke. If you have the irregular heart rhythm of atrial fibrillation, you should be on a blood thinner unless there is a good reason you cannot take them.  Understand all your medicine instructions. SEEK IMMEDIATE MEDICAL CARE IF:   You have sudden weakness or numbness of the face, arm, or leg, especially on one side of the body.  You have sudden confusion.  You have trouble speaking (aphasia) or understanding.  You have sudden trouble seeing in one or both eyes.  You have sudden trouble walking.  You have dizziness.  You have a loss of balance or coordination.  You have a sudden, severe headache with no known cause.  You have new chest pain or an irregular heartbeat. Any of these symptoms may represent a serious problem that is an emergency. Do not wait to see if the symptoms will go away. Get medical help right away. Call your local emergency services (911 in [place] Do not drive yourself to the hospital. Document Released: 02/01/2004 Document Revised: 03/18/2011 Document Reviewed: 06/26/2012 [employer] Patient Information 2014 [employer].   Peripheral Vascular Disease Peripheral Vascular Disease (PVD), also called Peripheral Arterial Disease (PAD), is a circulation problem caused by cholesterol (atherosclerotic plaque) deposits in the arteries. PVD commonly occurs in the lower extremities (legs) but it can occur in other areas of the body, such as your arms. The cholesterol buildup in the arteries reduces blood flow which can cause pain and other serious problems. The presence   of PVD can place a person at risk for Coronary Artery Disease (CAD).  CAUSES  Causes of PVD can be many. It is usually associated with more than one risk factor such  as:   High Cholesterol.  Smoking.  Diabetes.  Lack of exercise or inactivity.  High blood pressure (hypertension).  Obesity.  Family history. SYMPTOMS   When the lower extremities are affected, patients with PVD may experience:  Leg pain with exertion or physical activity. This is called INTERMITTENT CLAUDICATION. This may present as cramping or numbness with physical activity. The location of the pain is associated with the level of blockage. For example, blockage at the abdominal level (distal abdominal aorta) may result in buttock or hip pain. Lower leg arterial blockage may result in calf pain.  As PVD becomes more severe, pain can develop with less physical activity.  In people with severe PVD, leg pain may occur at rest.  Other PVD signs and symptoms:  Leg numbness or weakness.  Coldness in the affected leg or foot, especially when compared to the other leg.  A change in leg color.  Patients with significant PVD are more prone to ulcers or sores on toes, feet or legs. These may take longer to heal or may reoccur. The ulcers or sores can become infected.  If signs and symptoms of PVD are ignored, gangrene may occur. This can result in the loss of toes or loss of an entire limb.  Not all leg pain is related to PVD. Other medical conditions can cause leg pain such as:  Blood clots (embolism) or Deep Vein Thrombosis.  Inflammation of the blood vessels (vasculitis).  Spinal stenosis. DIAGNOSIS  Diagnosis of PVD can involve several different types of tests. These can include:  Pulse Volume Recording Method (PVR). This test is simple, painless and does not involve the use of X-rays. PVR involves measuring and comparing the blood pressure in the arms and legs. An ABI (Ankle-Brachial Index) is calculated. The normal ratio of blood pressures is 1. As this number becomes smaller, it indicates more severe disease.  < 0.95  indicates significant narrowing in one or more leg  vessels.  <0.8 there will usually be pain in the foot, leg or buttock with exercise.  <0.4 will usually have pain in the legs at rest.  <0.25  usually indicates limb threatening PVD.  Doppler detection of pulses in the legs. This test is painless and checks to see if you have a pulses in your legs/feet.  A dye or contrast material (a substance that highlights the blood vessels so they show up on x-ray) may be given to help your caregiver better see the arteries for the following tests. The dye is eliminated from your body by the kidney's. Your caregiver may order blood work to check your kidney function and other laboratory values before the following tests are performed:  Magnetic Resonance Angiography (MRA). An MRA is a picture study of the blood vessels and arteries. The MRA machine uses a large magnet to produce images of the blood vessels.  Computed Tomography Angiography (CTA). A CTA is a specialized x-ray that looks at how the blood flows in your blood vessels. An IV may be inserted into your arm so contrast dye can be injected.  Angiogram. Is a procedure that uses x-rays to look at your blood vessels. This procedure is minimally invasive, meaning a small incision (cut) is made in your groin. A small tube (catheter) is then inserted into the artery  of your groin. The catheter is guided to the blood vessel or artery your caregiver wants to examine. Contrast dye is injected into the catheter. X-rays are then taken of the blood vessel or artery. After the images are obtained, the catheter is taken out. TREATMENT  Treatment of PVD involves many interventions which may include:  Lifestyle changes:  Quitting smoking.  Exercise.  Following a low fat, low cholesterol diet.  Control of diabetes.  Foot care is very important to the PVD patient. Good foot care can help prevent infection.  Medication:  Cholesterol-lowering medicine.  Blood pressure medicine.  Anti-platelet  drugs.  Certain medicines may reduce symptoms of Intermittent Claudication.  Interventional/Surgical options:  Angioplasty. An Angioplasty is a procedure that inflates a balloon in the blocked artery. This opens the blocked artery to improve blood flow.  Stent Implant. A wire mesh tube (stent) is placed in the artery. The stent expands and stays in place, allowing the artery to remain open.  Peripheral Bypass Surgery. This is a surgical procedure that reroutes the blood around a blocked artery to help improve blood flow. This type of procedure may be performed if Angioplasty or stent implants are not an option. SEEK IMMEDIATE MEDICAL CARE IF:   You develop pain or numbness in your arms or legs.  Your arm or leg turns cold, becomes blue in color.  You develop redness, warmth, swelling and pain in your arms or legs. MAKE SURE YOU:   Understand these instructions.  Will watch your condition.  Will get help right away if you are not doing well or get worse. Document Released: 02/01/2004 Document Revised: 03/18/2011 Document Reviewed: 12/29/2007 [facility] Patient Information 2014 [place], [place].   Smoking Cessation Quitting smoking is important to your health and has many advantages. However, it is not always easy to quit since nicotine is a very addictive drug. Often times, people try 3 times or more before being able to quit. This document explains the best ways for you to prepare to quit smoking. Quitting takes hard work and a lot of effort, but you can do it. ADVANTAGES OF QUITTING SMOKING  You will live longer, feel better, and live better.  Your body will feel the impact of quitting smoking almost immediately.  Within 20 minutes, blood pressure decreases. Your pulse returns to its normal level.  After 8 hours, carbon monoxide levels in the blood return to normal. Your oxygen level increases.  After 24 hours, the chance of having a heart attack starts to decrease. Your  breath, hair, and body stop smelling like smoke.  After 48 hours, damaged nerve endings begin to recover. Your sense of taste and smell improve.  After 72 hours, the body is virtually free of nicotine. Your bronchial tubes relax and breathing becomes easier.  After 2 to 12 weeks, lungs can hold more air. Exercise becomes easier and circulation improves.  The risk of having a heart attack, stroke, cancer, or lung disease is greatly reduced.  After 1 year, the risk of coronary heart disease is cut in half.  After 5 years, the risk of stroke falls to the same as a nonsmoker.  After 10 years, the risk of lung cancer is cut in half and the risk of other cancers decreases significantly.  After 15 years, the risk of coronary heart disease drops, usually to the level of a nonsmoker.  If you are pregnant, quitting smoking will improve your chances of having a healthy baby.  The people you live with,  especially any children, will be healthier.  You will have extra money to spend on things other than cigarettes. QUESTIONS TO THINK ABOUT BEFORE ATTEMPTING TO QUIT You may want to talk about your answers with your caregiver.  Why do you want to quit?  If you tried to quit in the past, what helped and what did not?  What will be the most difficult situations for you after you quit? How will you plan to handle them?  Who can help you through the tough times? Your family? Friends? A caregiver?  What pleasures do you get from smoking? What ways can you still get pleasure if you quit? Here are some questions to ask your caregiver:  How can you help me to be successful at quitting?  What medicine do you think would be best for me and how should I take it?  What should I do if I need more help?  What is smoking withdrawal like? How can I get information on withdrawal? GET READY  Set a quit date.  Change your environment by getting rid of all cigarettes, ashtrays, matches, and lighters in  your home, car, or work. Do not let people smoke in your home.  Review your past attempts to quit. Think about what worked and what did not. GET SUPPORT AND ENCOURAGEMENT You have a better chance of being successful if you have help. You can get support in many ways.  Tell your family, friends, and co-workers that you are going to quit and need their support. Ask them not to smoke around you.  Get individual, group, or telephone counseling and support. Programs are available at [employer] and health centers. Call your local health department for information about programs in your area.  Spiritual beliefs and practices may help some smokers quit.  Download a "quit meter" on your computer to keep track of quit statistics, such as how long you have gone without smoking, cigarettes not smoked, and money saved.  Get a self-help book about quitting smoking and staying off of tobacco. [place] yourself from urges to smoke. Talk to someone, go for a walk, or occupy your time with a task.  Change your normal routine. Take a different route to work. Drink tea instead of coffee. Eat breakfast in a different place.  Reduce your stress. Take a hot bath, exercise, or read a book.  Plan something enjoyable to do every day. Reward yourself for not smoking.  Explore interactive web-based programs that specialize in helping you quit. GET MEDICINE AND USE IT CORRECTLY Medicines can help you stop smoking and decrease the urge to smoke. Combining medicine with the above behavioral methods and support can greatly increase your chances of successfully quitting smoking.  Nicotine replacement therapy helps deliver nicotine to your body without the negative effects and risks of smoking. Nicotine replacement therapy includes nicotine gum, lozenges, inhalers, nasal sprays, and skin patches. Some may be available over-the-counter and others require a  prescription.  Antidepressant medicine helps people abstain from smoking, but how this works is unknown. This medicine is available by prescription.  Nicotinic receptor partial agonist medicine simulates the effect of nicotine in your brain. This medicine is available by prescription. Ask your caregiver for advice about which medicines to use and how to use them based on your health history. Your caregiver will tell you what side effects to look out for if you choose to be on a medicine or therapy. Carefully read the information  on the package. Do not use any other product containing nicotine while using a nicotine replacement product.  RELAPSE OR DIFFICULT SITUATIONS Most relapses occur within the first 3 months after quitting. Do not be discouraged if you start smoking again. Remember, most people try several times before finally quitting. You may have symptoms of withdrawal because your body is used to nicotine. You may crave cigarettes, be irritable, feel very hungry, cough often, get headaches, or have difficulty concentrating. The withdrawal symptoms are only temporary. They are strongest when you first quit, but they will go away within 10 14 days. To reduce the chances of relapse, try to:  Avoid drinking alcohol. Drinking lowers your chances of successfully quitting.  Reduce the amount of caffeine you consume. Once you quit smoking, the amount of caffeine in your body increases and can give you symptoms, such as a rapid heartbeat, sweating, and anxiety.  Avoid smokers because they can make you want to smoke.  Do not let weight gain distract you. Many smokers will gain weight when they quit, usually less than 10 pounds. Eat a healthy diet and stay active. You can always lose the weight gained after you quit.  Find ways to improve your mood other than smoking. FOR MORE INFORMATION  www.smokefree.gov  Document Released: 12/18/2000 Document Revised: 06/25/2011 Document Reviewed:  04/04/2011 [facility] Patient Information 2014 [place], [place].

## 2013-01-11 NOTE — Progress Notes (Signed)
[facility] HISTORY AND PHYSICAL   MRN : [id number]  History of Present Illness:   Kimberly [name] is a 63 y.o. female patient of Dr. Trula Slade who is status post stenting of her left subclavian artery on 06/24/2012 in the setting of a symptomatic subclavian steal syndrome.  She is also s/p left CEA on 06/13/11 and aorto-bifemoral bypass graft on 02/26/11. She returns today for carotid artery Duplex and ABI's. Her symptoms of stroke were bilateral partial loss of vision last year for a couple of months right before the left subclavian artery stenting in June, 2014, she has not had further stroke or TIA symptoms and states her vision has been restored.  Has right fingers numbness and right arm feels heavy, this has been ongoing and has not gotten worse. Denies pain referable to claudication symptoms, but does have chronic right hip to knee pain that she states has not had evaluated yet as she does not have a PCP as she does not have Medicaid; states she has re-applied for. She reports that she had cholecystectomy last month.   Pt Diabetic: No Pt smoker: smoker  (<1/2 ppd x 48 yrs), states this is decreased a great deal from 2.5 ppd  Current Outpatient Prescriptions  Medication Sig Dispense Refill  . acetaminophen (TYLENOL) 325 MG tablet Take 650 mg by mouth every 6 (six) hours as needed for pain.      Marland Kitchen aspirin 325 MG tablet Take 325 mg by mouth daily.      . clopidogrel (PLAVIX) 75 MG tablet Take 75 mg by mouth daily.      Marland Kitchen guaifenesin (ROBITUSSIN) 100 MG/5ML syrup Take 200 mg by mouth 3 (three) times daily as needed for cough.      Marland Kitchen ibuprofen (ADVIL,MOTRIN) 200 MG tablet Take 400-600 mg by mouth every 6 (six) hours as needed for pain.       Marland Kitchen OVER THE COUNTER MEDICATION Place 1 application into both nostrils daily as needed (congestion).      . polyethylene glycol (MIRALAX / GLYCOLAX) packet Take 17 g by mouth daily as needed (constipation).       .  pseudoephedrine-acetaminophen (TYLENOL SINUS) 30-500 MG TABS Take 1 tablet by mouth every 4 (four) hours as needed.      . traMADol (ULTRAM) 50 MG tablet Take 100 mg by mouth every 8 (eight) hours as needed for pain.        No current facility-administered medications for this visit.    Pt meds include: Statin :No, states it upset her stomach ASA: Yes Other anticoagulants/antiplatelets: Plavix  Past Medical History  Diagnosis Date  . Asthma   . Hyperlipidemia   . History of lead poisoning 1977  . Shortness of breath   . Peripheral vascular disease     claudication  right greater than left  femoral artery  . Anxiety   . Blood transfusion   . Migraine   . Subclavian artery disease     left  . Small bowel obstruction, partial 04/08/11  . Angina     March 2013  . Depression   . Uterine cancer     Pre cervical  . Fibromyalgia   . Anemia     Past Surgical History  Procedure Laterality Date  . Abdominal angiogram    . Aorta - bilateral femoral artery bypass graft  02/26/2011    Procedure: AORTA BIFEMORAL BYPASS GRAFT;  Surgeon: Theotis Burrow, MD;  Location: [facility];  Service:  Vascular;  Laterality: N/A;  . Pr vein bypass graft,aorto-fem-pop  02/26/11  . Laparotomy  04/10/2011    Procedure: EXPLORATORY LAPAROTOMY;  Surgeon: Shann Medal, MD;  Location: [place];  Service: General;  Laterality: N/A;  ENTEROLYSIS OF ADHESIONS  . Endarterectomy  06/13/2011    Procedure: ENDARTERECTOMY CAROTID;  Surgeon: Serafina Mitchell, MD;  Location: [facility] OR;  Service: Vascular;  Laterality: Left;  Left carotid artery endarterectomy with vascu-guard patch angioplasty  . Carotid endarterectomy  06/13/2011    Left  CEA  . Lower extremity angiogram Left 06/24/12    Left stent intervention, left subclavian  . Cholecystectomy N/A 12/08/2012    Procedure: LAPAROSCOPIC CHOLECYSTECTOMY WITH INTRAOPERATIVE CHOLANGIOGRAM;  Surgeon: Haywood Lasso, MD;  Location: [place] OR;  Service: General;  Laterality: N/A;  .  Abdominal hysterectomy  2008    Social History History  Substance Use Topics  . Smoking status: Current Some Day Smoker -- 0.05 packs/day for 45 years    Types: Cigarettes    Last Attempt to Quit: 02/24/2011  . Smokeless tobacco: Never Used     Comment: pt states that she smokes about 6 cigs per day  . Alcohol Use: No    Family History Family History  Problem Relation Age of Onset  . Cancer Mother     breast, colon, ovarian  . Anesthesia problems Neg Hx   . Cancer Brother   . Cancer Other   . Stroke Other   . Cancer Maternal Grandmother     ovarian    Allergies  Allergen Reactions  . Hydrocodone Nausea And Vomiting and Other (See Comments)    Headache and dizziness  . Other Shortness Of Breath    Cologne  . Oxycodone Nausea And Vomiting and Other (See Comments)    Dizziness   . Percocet [Oxycodone-Acetaminophen] Nausea And Vomiting  . Codeine Nausea Only    dizziness     REVIEW OF SYSTEMS: See HPI for pertinent positives and negatives.  Physical Examination Filed Vitals:   01/11/13 1434 01/11/13 1438  BP: 120/64 118/70  Pulse: 69 67  Resp:  16  Height:  5\' 4"  (1.626 m)  Weight:  155 lb (70.308 kg)  SpO2:  97%   Body mass index is 26.59 kg/(m^2).  General:  WDWN in NAD, cigarette smoke odor. Gait: Normal HENT: WNL, poor dentitian Eyes: PERRLA Pulmonary: normal non-labored breathing , without Rales, rhonchi,  Wheezing, decreased air movement in all fields. Cardiac: RRR, without  Murmurs, rubs or gallops;  Abdomen: soft, tender to palpation as she had laparoscopic cholecystectomy last month, no masses Skin: no rashes, ulcers noted;  no Gangrene , no cellulitis; no open wounds;   Vascular Exam/Pulses: VASCULAR EXAM  Carotid Bruits Left Right   Positive Positive      Bilateral radial pulses are 2+ and =.                       VASCULAR EXAM: Extremities without ischemic changes  without Gangrene.  LE Pulses LEFT RIGHT       FEMORAL   palpable  not palpable        POPLITEAL  not palpable   not palpable       POSTERIOR TIBIAL   palpable    palpable        DORSALIS PEDIS      ANTERIOR TIBIAL  palpable   palpable      Musculoskeletal: no muscle wasting or atrophy; no edema  Neurologic: A&O X 3; Appropriate Affect ;  SENSATION: normal; MOTOR FUNCTION: 5/5 Symmetric, CN 2-12 intact Speech is fluent/normal   Non-Invasive Vascular Imaging (01/11/2013):  60-79% (approaching 80%) right ICA stenosis, increased since 12/09/11 exam <40% left ICA stenosis, patent CEA site Left subclavian stent appears patent Bilateral external carotid artery stenosis.  ASSESSMENT:  PREET MANGANO is a 63 y.o. female patient who is status post stenting of her left subclavian artery on 06/24/2012 in the setting of a symptomatic subclavian steal syndrome.  She is also s/p left CEA on 06/13/11 and aorto-bifemoral bypass graft on 02/26/11. She has had no further stroke or TIA symptoms since about April, 2014; the right ICA stenosis is still in the 60-79% as it was on 12/09/11, but at the higher end of this range. She has right fingers numbness and right arm feels heavy, this has been ongoing and has not gotten worse. Brachial pressures are equal. Unfortunately she continues to smoke which is a major risk factor for the progression of atheroscerosis, she was counseled about this.  PLAN:   Based on today's exam and Duplex results, and after discussing with Dr. Trula Slade, patient advised to return in 6 months for carotid Duplex. I discussed in depth with the patient the nature of atherosclerosis, and emphasized the importance of maximal medical management including strict control of blood pressure, blood glucose, and lipid levels, obtaining regular exercise, and cessation of smoking.  The patient is aware that without maximal medical management the underlying  atherosclerotic disease process will progress, limiting the benefit of any interventions. The patient was given information about stroke prevention and what symptoms should prompt the patient to seek immediate medical care.  The patient was given information about PAD including signs, symptoms, treatment, what symptoms should prompt the patient to seek immediate medical care, and risk reduction measures to take.  Clemon Chambers, RN, MSN, FNP-C [facility] Office: [phone]  Clinic MD: Trula Slade 01/11/2013 2:48 PM

## 2013-01-22 ENCOUNTER — Other Ambulatory Visit: Payer: Self-pay | Admitting: *Deleted

## 2013-01-22 DIAGNOSIS — I6529 Occlusion and stenosis of unspecified carotid artery: Secondary | ICD-10-CM

## 2013-01-26 IMAGING — CT CT CTA ABD/PEL W/CM AND/OR W/O CM
1 of 5 series · 14 of 46 positions shown, 18 images · IV contrast (APPLIED)
Comparison: 03/19/2011

CLINICAL DATA: Epigastric pain

CT ANGIOGRAPHY ABDOMEN AND PELVIS
TECHNIQUE: Multidetector CT imaging of the abdomen and pelvis was
performed using the standard protocol during bolus administration
of intravenous contrast.  Multiplanar reconstructed images
including MIPs were obtained and reviewed to evaluate the vascular
anatomy.
Contrast:   100 ml Omnipaque 350

[Series 6: abd cta 2.0 (id) · axial · 0.70mm/px · z∈[-356,+24]mm · 14 of 210 slices shown, 18 images]
[im 10/210  soft-tissue]
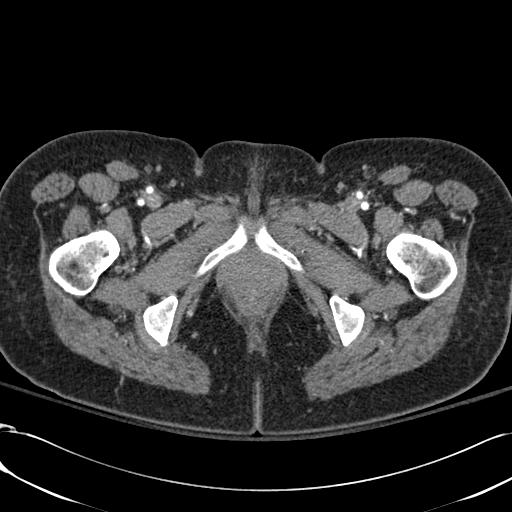
[im 10/210  bone]
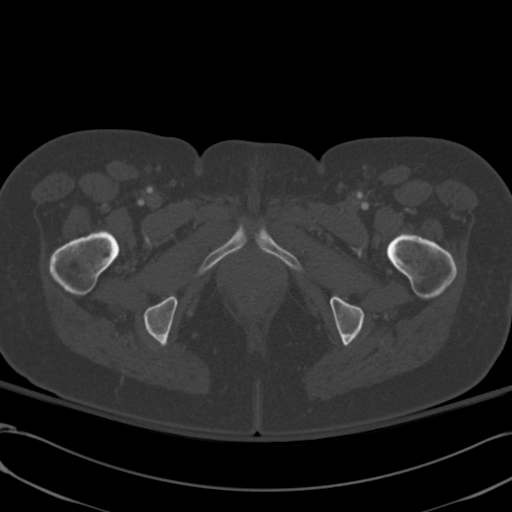
[im 30/210  soft-tissue]
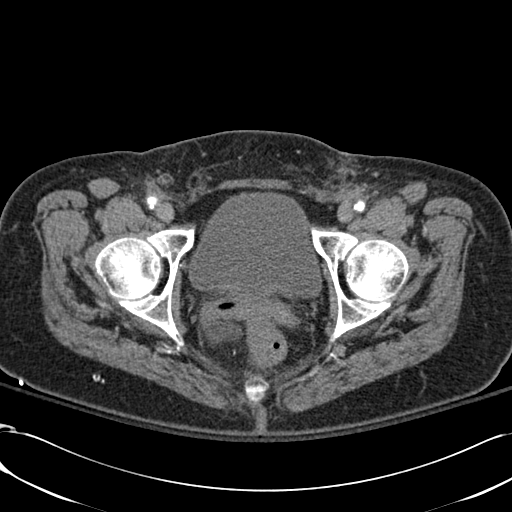
[im 50/210  soft-tissue]
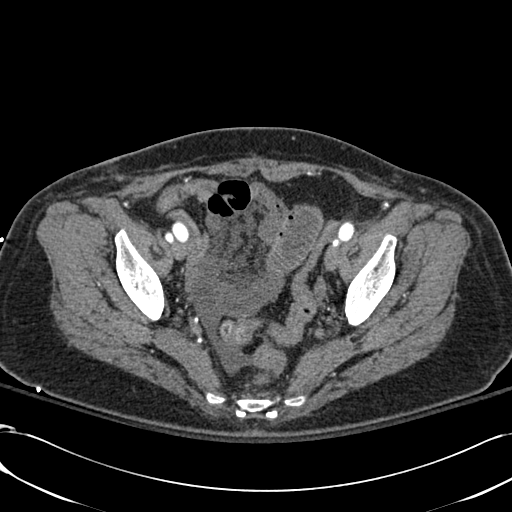
[im 60/210  soft-tissue]
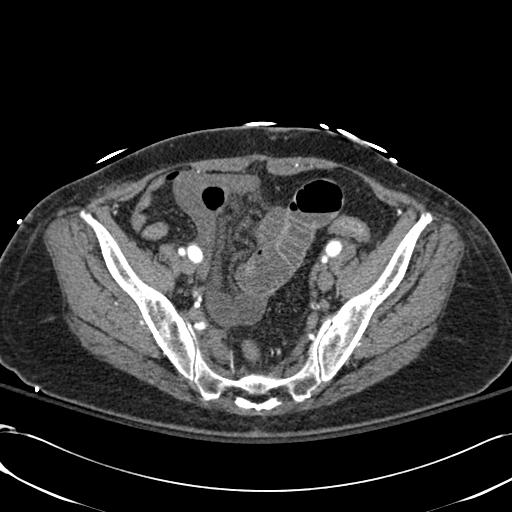
[im 80/210  soft-tissue]
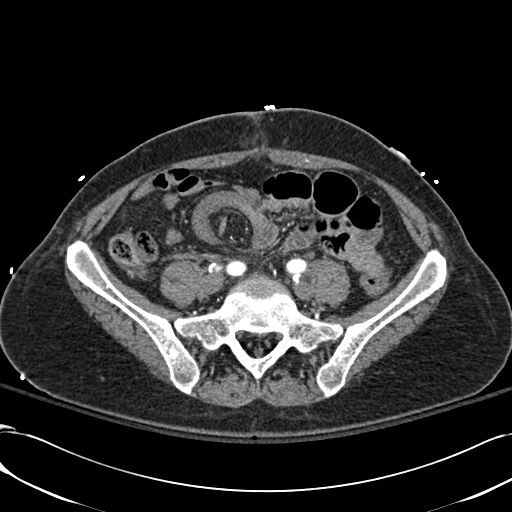
[im 100/210  soft-tissue]
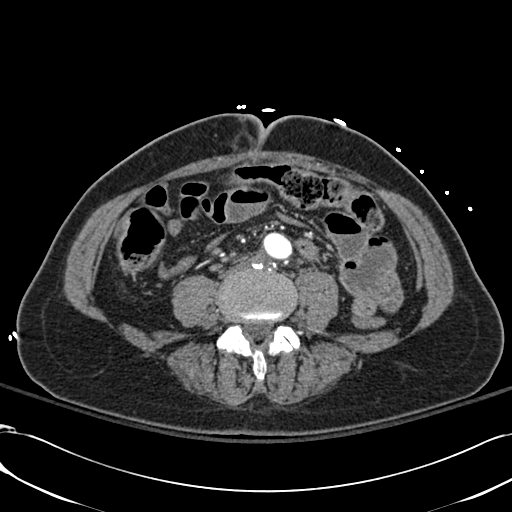
[im 110/210  soft-tissue]
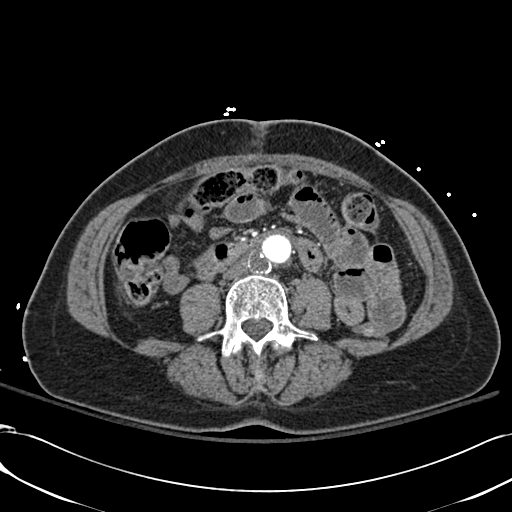
[im 130/210  soft-tissue]
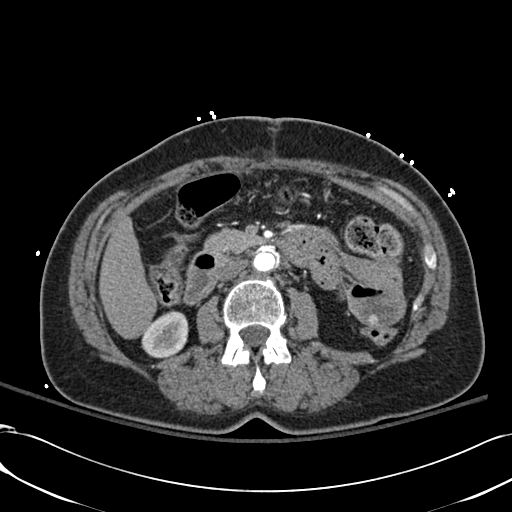
[im 150/210  soft-tissue]
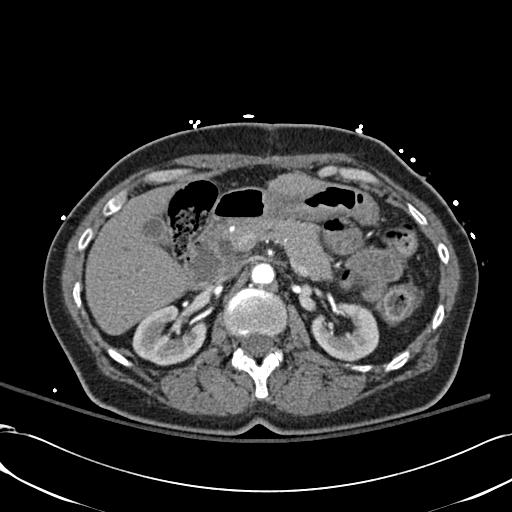
[im 150/210  bone]
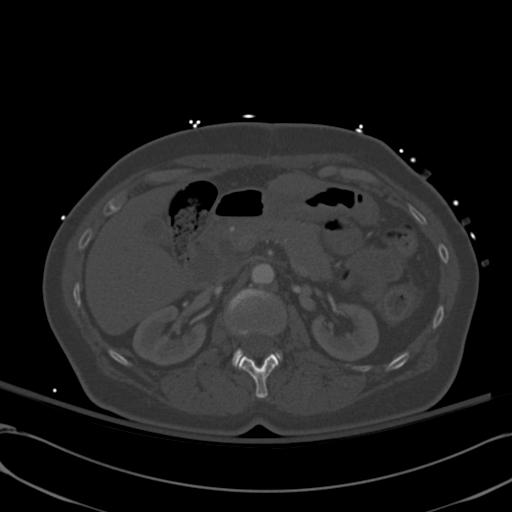
[im 160/210  soft-tissue]
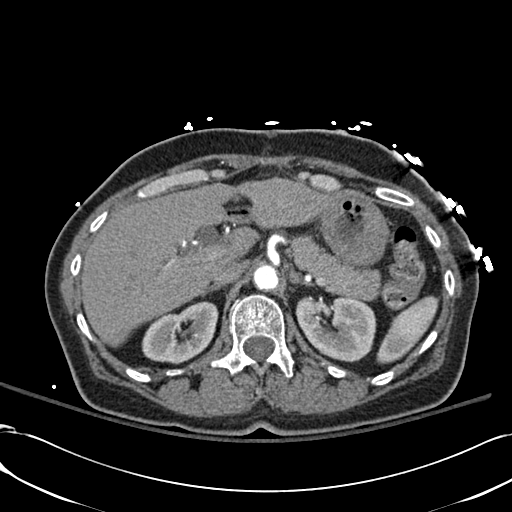
[im 170/210  lung]
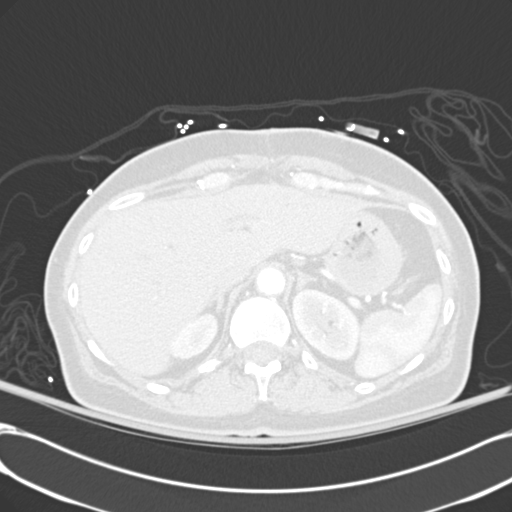
[im 180/210  soft-tissue]
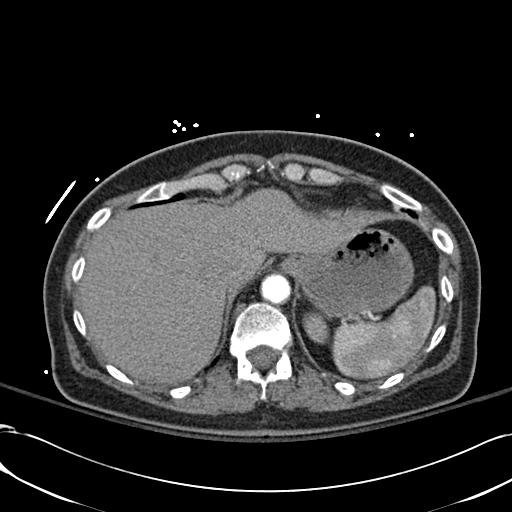
[im 180/210  lung]
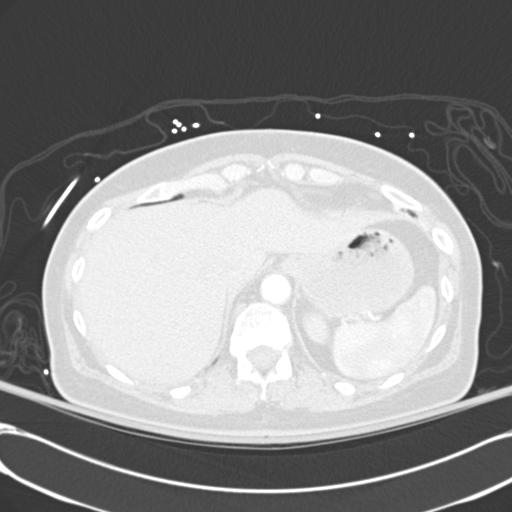
[im 190/210  lung]
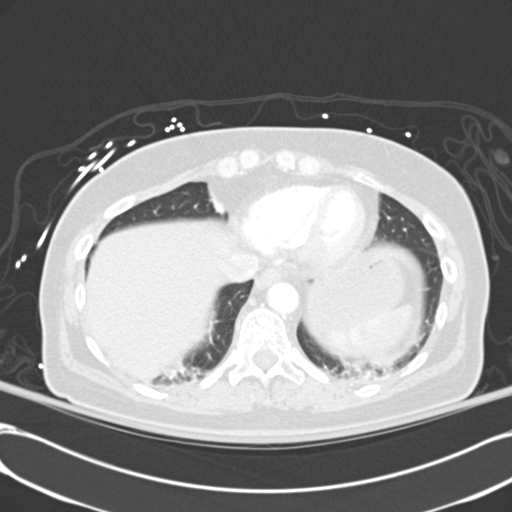
[im 200/210  soft-tissue]
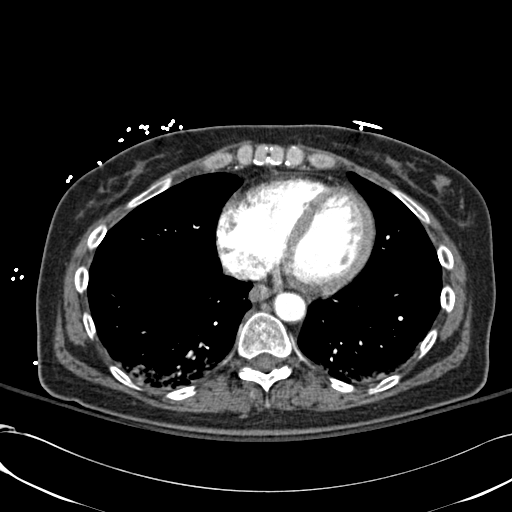
[im 200/210  lung]
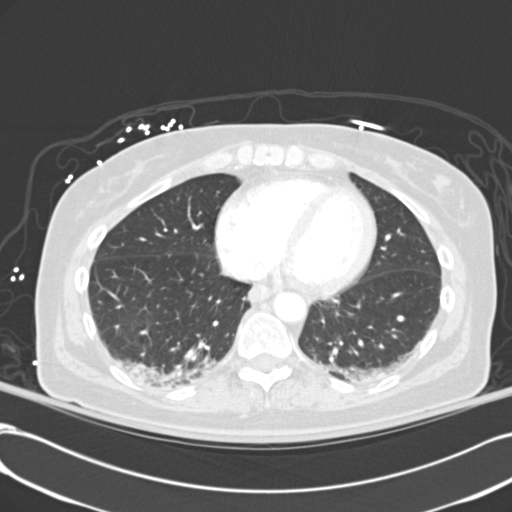

[14 of 46 positions shown; findings below may reference images not displayed]

FINDINGS: Aorto bifemoral bypass graft is patent.  Common iliac
arteries are occluded.  Internal and external iliac arteries
reconstitute.  There is mild narrowing of the right common femoral
artery beyond the distal anastomosis.

 There is at least 60% narrowing just beyond the origin of the SMA.
There is approximately 50% narrowing at the origin of the celiac
axis.  IMA branches reconstitute has its origin has been occluded
by the bypass graft.  There is no evidence of contrast
extravasation to suggest leak or pseudoaneurysm formation.

The left renal artery is widely patent.  There is significant
narrowing at the origin of the right renal artery which is
critical.  The prior study was not protocoled for arterial imaging
and narrowing at the celiac and SMA was not clearly demonstrated.

Heterogeneous opacities at the dependent aspect of both lower
lobes.  Liver, gallbladder, spleen, pancreas are within normal
limits.  The common bile duct is markedly dilated the pancreatic
duct is normal in caliber, but this is a stable finding.  No
obvious pancreatic head mass.

There is moderate free fluid in the pelvis.  Lack of oral contrast
limits evaluation of bowel.  No loculated fluid collection to
suggest abscess. Gas and fluid filled small bowel loops in the
lower abdomen are mildly distended air-fluid levels.  Distal small
bowel is decompressed. Within the adjacent mesentery, there is
fluid density and stranding.

Bladder is unremarkable.  Uterus is absent.

 Adrenal glands are within normal limits.

 Review of the MIP images confirms the above findings.
IMPRESSION: Aorto bifemoral bypass graft is patent without evidence of
complication.

There is narrowing at the origin of the celiac and SMA.  These
findings can be associated with chronic mesenteric ischemia.

Significant stenosis at the origin of the right renal artery.

Nonspecific free fluid in the pelvis.

There are distended small bowel loops in the lower abdomen and
decompressed the distal small bowel loops. This is associated with
edema within the mesentery.  Developing small bowel obstruction or
an inflammatory process may be present.

## 2013-01-28 IMAGING — CR DG CHEST 1V PORT
1 series · 1 of 1 positions shown · non-contrast
Comparison: Portable exam 9773 hours compared to 04/08/2011

CLINICAL DATA: Post bowel resection, right jugular line placement

PORTABLE CHEST - 1 VIEW

[view not recorded]
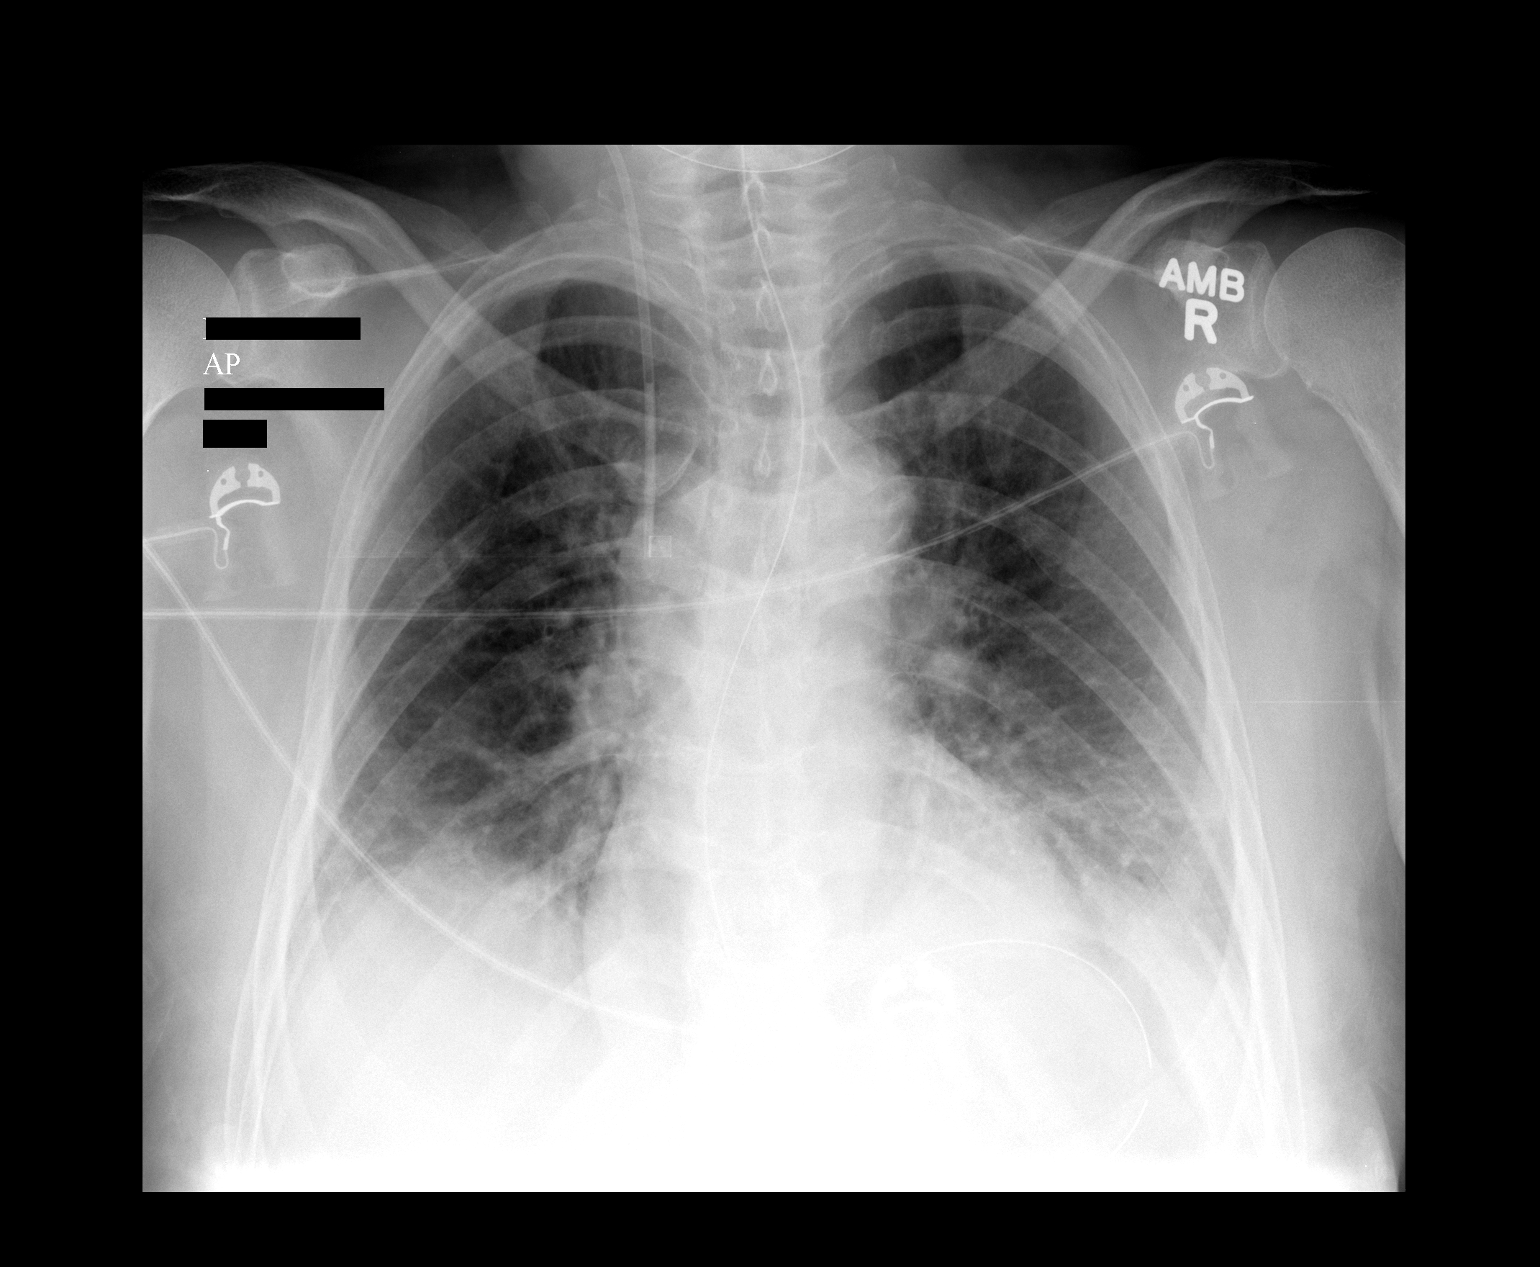

[1 of 1 positions shown; findings below may reference images not displayed]

FINDINGS: Nasogastric tube coiled in stomach.
New right jugular central venous catheter, tip projecting over SVC.
Normal heart size, mediastinal contours and pulmonary vascularity
for technique.
Bibasilar atelectasis.
Minimal bronchitic changes.
Remaining lungs clear.
No definite pleural effusion or pneumothorax.
Minimal atherosclerotic calcification aorta.
No acute osseous findings.
IMPRESSION: No pneumothorax following central line insertion.
Bibasilar atelectasis and minimal bronchitic changes.

## 2013-07-12 ENCOUNTER — Other Ambulatory Visit (HOSPITAL_COMMUNITY): Payer: Self-pay

## 2013-07-12 ENCOUNTER — Ambulatory Visit: Payer: Self-pay | Admitting: Family

## 2013-07-30 ENCOUNTER — Encounter: Payer: Self-pay | Admitting: Family

## 2013-08-02 ENCOUNTER — Ambulatory Visit (INDEPENDENT_AMBULATORY_CARE_PROVIDER_SITE_OTHER): Payer: Medicaid Other | Admitting: Family

## 2013-08-02 ENCOUNTER — Ambulatory Visit (HOSPITAL_COMMUNITY)
Admission: RE | Admit: 2013-08-02 | Discharge: 2013-08-02 | Disposition: A | Discharge status: Home / Self Care | Payer: Self-pay | Source: Ambulatory Visit | Attending: Family | Admitting: Family

## 2013-08-02 ENCOUNTER — Encounter: Payer: Self-pay | Admitting: Family

## 2013-08-02 VITALS — BP 125/74 | HR 62 | Resp 20 | Ht 64.0 in | Wt 147.8 lb

## 2013-08-02 DIAGNOSIS — I739 Peripheral vascular disease, unspecified: Secondary | ICD-10-CM

## 2013-08-02 DIAGNOSIS — Z01818 Encounter for other preprocedural examination: Secondary | ICD-10-CM

## 2013-08-02 DIAGNOSIS — I6529 Occlusion and stenosis of unspecified carotid artery: Secondary | ICD-10-CM

## 2013-08-02 NOTE — Patient Instructions (Signed)

## 2013-08-02 NOTE — Progress Notes (Signed)
Established Carotid Patient   History of Present Illness  Kimberly [name] is a 63 y.o. female patient of Dr. Trula Slade who is status post stenting of her left subclavian artery on 06/24/2012 in the setting of a symptomatic subclavian steal syndrome.  She is also s/p left CEA on 06/13/11 and aorto-bifemoral bypass graft on 02/26/11.  She returns today for carotid artery Duplex.  Her symptoms of stroke in the past were bilateral partial loss of vision last year for a couple of months right before the left subclavian artery stenting in June, 2014, she has had intermittent blurry vision in both eyes for the last several months and states her vision has been restored. Also having intermittent headaches.  Feels generalized weakness for 1-2 months. Denies hemiparesis symptoms.  Feels exhausted all the time, does not know why she cannot sleep. Denies pain referable to claudication symptoms, but does have chronic right hip to knee pain that she states has not had evaluated yet as she does not have a PCP as she does not have Medicaid; states she has re-applied for, does not have a PCP yet.  She denies any new medical problems.   Pt Diabetic: No  Pt smoker: smoker (<1/2 ppd x 48 yrs), states this is decreased a great deal from 2.5 ppd   Past Medical History  Diagnosis Date  . Asthma   . Hyperlipidemia   . History of lead poisoning 1977  . Shortness of breath   . Peripheral vascular disease     claudication  right greater than left  femoral artery  . Anxiety   . Blood transfusion   . Migraine   . Subclavian artery disease     left  . Small bowel obstruction, partial 04/08/11  . Angina     March 2013  . Depression   . Uterine cancer     Pre cervical  . Fibromyalgia   . Anemia     Social History History  Substance Use Topics  . Smoking status: Current Some Day Smoker -- 0.05 packs/day for 45 years    Types: Cigarettes    Last Attempt to Quit: 02/24/2011  . Smokeless tobacco: Never Used      Comment: pt states that she smokes about 6 cigs per day  . Alcohol Use: No    Family History Family History  Problem Relation Age of Onset  . Cancer Mother     breast, colon, ovarian  . Anesthesia problems Neg Hx   . Cancer Brother   . Cancer Other   . Stroke Other   . Cancer Maternal Grandmother     ovarian    Surgical History Past Surgical History  Procedure Laterality Date  . Abdominal angiogram    . Aorta - bilateral femoral artery bypass graft  02/26/2011    Procedure: AORTA BIFEMORAL BYPASS GRAFT;  Surgeon: Theotis Burrow, MD;  Location: [facility];  Service: Vascular;  Laterality: N/A;  . Pr vein bypass graft,aorto-fem-pop  02/26/11  . Laparotomy  04/10/2011    Procedure: EXPLORATORY LAPAROTOMY;  Surgeon: Shann Medal, MD;  Location: [facility];  Service: General;  Laterality: N/A;  ENTEROLYSIS OF ADHESIONS  . Endarterectomy  06/13/2011    Procedure: ENDARTERECTOMY CAROTID;  Surgeon: Serafina Mitchell, MD;  Location: [facility];  Service: Vascular;  Laterality: Left;  Left carotid artery endarterectomy with vascu-guard patch angioplasty  . Carotid endarterectomy  06/13/2011    Left  CEA  . Lower extremity angiogram Left 06/24/12  Left stent intervention, left subclavian  . Cholecystectomy N/A 12/08/2012    Procedure: LAPAROSCOPIC CHOLECYSTECTOMY WITH INTRAOPERATIVE CHOLANGIOGRAM;  Surgeon: Haywood Lasso, MD;  Location: [facility] OR;  Service: General;  Laterality: N/A;  . Abdominal hysterectomy  2008    Allergies  Allergen Reactions  . Hydrocodone Nausea And Vomiting and Other (See Comments)    Headache and dizziness  . Other Shortness Of Breath    Cologne  . Oxycodone Nausea And Vomiting and Other (See Comments)    Dizziness   . Percocet [Oxycodone-Acetaminophen] Nausea And Vomiting  . Codeine Nausea Only    dizziness    Current Outpatient Prescriptions  Medication Sig Dispense Refill  . acetaminophen (TYLENOL) 325 MG tablet Take 650 mg by mouth every 6 (six) hours as needed for  pain.      Marland Kitchen aspirin 325 MG tablet Take 325 mg by mouth daily.      . clopidogrel (PLAVIX) 75 MG tablet Take 75 mg by mouth daily.      Marland Kitchen guaifenesin (ROBITUSSIN) 100 MG/5ML syrup Take 200 mg by mouth 3 (three) times daily as needed for cough.      Marland Kitchen ibuprofen (ADVIL,MOTRIN) 200 MG tablet Take 400-600 mg by mouth every 6 (six) hours as needed for pain.       Marland Kitchen OVER THE COUNTER MEDICATION Place 1 application into both nostrils daily as needed (congestion).      . polyethylene glycol (MIRALAX / GLYCOLAX) packet Take 17 g by mouth daily as needed (constipation).       . pseudoephedrine-acetaminophen (TYLENOL SINUS) 30-500 MG TABS Take 1 tablet by mouth every 4 (four) hours as needed.      . traMADol (ULTRAM) 50 MG tablet Take 100 mg by mouth every 8 (eight) hours as needed for pain.        No current facility-administered medications for this visit.    Review of Systems : See HPI for pertinent positives and negatives.  Physical Examination  Filed Vitals:   08/02/13 1407 08/02/13 1409  BP: 123/69 125/74  Pulse: 62   Resp: 20   Height: 5\' 4"  (1.626 m)   Weight: 147 lb 12.8 oz (67.042 kg)    Body mass index is 25.36 kg/(m^2).  General: WDWN in NAD, appears fatigued.  Gait: Normal  HENT: WNL, poor dentitian  Eyes: PERRLA  Pulmonary: normal non-labored breathing , without Rales, rhonchi, Wheezing, decreased air movement in all fields.  Cardiac: RRR, without Murmurs, rubs or gallops;  Abdomen: soft, moderately tender to palpation in all  quadrants, no masses palpated Skin: no rashes, ulcers noted; no Gangrene , no cellulitis; no open wounds;    VASCULAR EXAM  Carotid Bruits  Left  Right    Positive  Positive   Bilateral radial pulses are 2+ and =.  VASCULAR EXAM:  Extremities without ischemic changes  without Gangrene.  LE Pulses  LEFT  RIGHT   FEMORAL  3+palpable  not palpable   POPLITEAL  not palpable  not palpable   POSTERIOR TIBIAL  2+palpable  1+palpable   DORSALIS PEDIS   ANTERIOR TIBIAL  2+palpable  2+palpable    Musculoskeletal: no muscle wasting or atrophy; no edema  Neurologic: A&O X 3; Appropriate Affect ;  SENSATION: normal;  MOTOR FUNCTION: 5/5 Symmetric  In UE's, 4/5 in LE's, CN 2-12 intact  Speech is fluent/normal   Non-Invasive Vascular Imaging CAROTID DUPLEX 08/02/2013   CEREBROVASCULAR DUPLEX EVALUATION    INDICATION: Follow-up carotid disease     PREVIOUS  INTERVENTION(S): Left carotid endarterectomy 06/13/2011, left subclavian artery stent placed 06/24/2012    DUPLEX EXAM:     RIGHT  LEFT  Peak Systolic Velocities (cm/s) End Diastolic Velocities (cm/s) Plaque LOCATION Peak Systolic Velocities (cm/s) End Diastolic Velocities (cm/s) Plaque  90 17  CCA PROXIMAL 102 26   83 19  CCA MID 114 36   72 19  CCA DISTAL 101 33   219 30  ECA 301 34   402 135 HT ICA PROXIMAL 134 45   143 41 HT ICA MID 149 45   63 22  ICA DISTAL 109 38     5.6 ICA / CCA Ratio (PSV) NA  Antegrade  Vertebral Flow Abnormal antegrade   416 Brachial Systolic Pressure (mmHg) 606  Within normal limits  Brachial Artery Waveforms Biphasic     Plaque Morphology:  HM = Homogeneous, HT = Heterogeneous, CP = Calcific Plaque, SP = Smooth Plaque, IP = Irregular Plaque     ADDITIONAL FINDINGS: Right subclavian artery velocity: 203cm/second, left subclavian artery velocity: 397 cm/second     IMPRESSION: 1. 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  2. Widely patent left carotid endarterectomy without evidence of restenosis or hyperplasia. 3. Right vertebral artery is antegrade, left vertebral artery is abnormal antegrade. 4. Left subclavian stent could not be visualized; however, in light of abnormal vertebral artery waveform, turbulent subclavian artery waveform, and elevated velocities distal to the stent, significant restenosis is suspected.    Compared to the previous exam:  Disease progression of right internal carotid artery and likely new in-stent stenosis  of the left subclavian artery.    Assessment: Kimberly [name] is a 63 y.o. female who is status post stenting of her left subclavian artery on 06/24/2012 in the setting of a symptomatic subclavian steal syndrome.  She is also s/p left CEA on 06/13/11 and aorto-bifemoral bypass graft on 02/26/11. Her symptoms of stroke in the past were bilateral partial loss of vision last year for a couple of months right before the left subclavian artery stenting in June, 2014, she has had intermittent blurry vision in both eyes for the last several months and states her vision has been restored. Also having intermittent headaches.  Feels generalized weakness for 1-2 months. Denies hemiparesis symptoms. 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  Widely patent left carotid endarterectomy without evidence of restenosis or hyperplasia. Left subclavian stent could not be visualized; however, in light of abnormal vertebral artery waveform, turbulent subclavian artery waveform, and elevated velocities distal to the stent, significant restenosis is suspected.  Disease progression of right internal carotid artery and likely new in-stent stenosis of the left subclavian artery.  Plan: Schedule for right CEA on 08/13/13, left subclavian stent placement on 08/12/13, Dr. Trula Slade. Referral to ENT and PCP.   Counseled re smoking cessation.  I discussed in depth with the patient the nature of atherosclerosis, and emphasized the importance of maximal medical management including strict control of blood pressure, blood glucose, and lipid levels, obtaining regular exercise, and cessation of smoking.  The patient is aware that without maximal medical management the underlying atherosclerotic disease process will progress, limiting the benefit of any interventions. The patient was given information about stroke prevention and what symptoms should prompt the patient to seek immediate medical  care. Thank you for allowing [place] to participate in this patient's care.  Clemon Chambers, RN, MSN, FNP-C [facility] Office: [phone]  Clinic Physician:  [place]  08/02/2013 1:55 PM

## 2013-08-03 ENCOUNTER — Telehealth: Payer: Self-pay | Admitting: Family

## 2013-08-03 ENCOUNTER — Other Ambulatory Visit: Payer: Self-pay

## 2013-08-03 ENCOUNTER — Encounter (HOSPITAL_COMMUNITY): Payer: Self-pay | Admitting: Pharmacy Technician

## 2013-08-03 NOTE — Telephone Encounter (Signed)
Gave pt appt info: Dr. Benjamine Mola (ENT) 08/10/13 1:30pm [phone]  [address]. Also gave pt # for [facility] [phone]. Pt to call them when she gets her medicare to schedule appt. As they would not schedule without insurance. Pt verbalized understanding - kf

## 2013-08-10 DIAGNOSIS — K219 Gastro-esophageal reflux disease without esophagitis: Secondary | ICD-10-CM | POA: Diagnosis not present

## 2013-08-10 DIAGNOSIS — R490 Dysphonia: Secondary | ICD-10-CM | POA: Diagnosis not present

## 2013-08-11 MED ORDER — SODIUM CHLORIDE 0.9 % IV SOLN
INTRAVENOUS | Status: DC
  Administered 2013-08-12: 07:00:00 via INTRAVENOUS

## 2013-08-12 ENCOUNTER — Encounter (HOSPITAL_COMMUNITY)
Admission: RE | Disposition: A | Discharge status: Home / Self Care | Payer: Self-pay | Source: Ambulatory Visit | Attending: Surgery

## 2013-08-12 ENCOUNTER — Inpatient Hospital Stay (HOSPITAL_COMMUNITY)
Admission: RE | Admit: 2013-08-12 | Discharge: 2013-08-14 | DRG: 254 | Disposition: A | Discharge status: Home / Self Care | Payer: Medicare Other | Source: Ambulatory Visit | Attending: Surgery | Admitting: Surgery

## 2013-08-12 ENCOUNTER — Encounter (HOSPITAL_COMMUNITY): Payer: Self-pay | Admitting: *Deleted

## 2013-08-12 DIAGNOSIS — I6529 Occlusion and stenosis of unspecified carotid artery: Secondary | ICD-10-CM | POA: Diagnosis present

## 2013-08-12 DIAGNOSIS — F17200 Nicotine dependence, unspecified, uncomplicated: Secondary | ICD-10-CM | POA: Diagnosis present

## 2013-08-12 DIAGNOSIS — I771 Stricture of artery: Principal | ICD-10-CM | POA: Diagnosis present

## 2013-08-12 DIAGNOSIS — G458 Other transient cerebral ischemic attacks and related syndromes: Secondary | ICD-10-CM | POA: Diagnosis present

## 2013-08-12 DIAGNOSIS — G43909 Migraine, unspecified, not intractable, without status migrainosus: Secondary | ICD-10-CM | POA: Diagnosis present

## 2013-08-12 DIAGNOSIS — F411 Generalized anxiety disorder: Secondary | ICD-10-CM | POA: Diagnosis present

## 2013-08-12 DIAGNOSIS — I6521 Occlusion and stenosis of right carotid artery: Secondary | ICD-10-CM

## 2013-08-12 DIAGNOSIS — I70219 Atherosclerosis of native arteries of extremities with intermittent claudication, unspecified extremity: Secondary | ICD-10-CM | POA: Diagnosis not present

## 2013-08-12 DIAGNOSIS — F329 Major depressive disorder, single episode, unspecified: Secondary | ICD-10-CM | POA: Diagnosis present

## 2013-08-12 DIAGNOSIS — E785 Hyperlipidemia, unspecified: Secondary | ICD-10-CM | POA: Diagnosis present

## 2013-08-12 DIAGNOSIS — IMO0001 Reserved for inherently not codable concepts without codable children: Secondary | ICD-10-CM | POA: Diagnosis present

## 2013-08-12 DIAGNOSIS — J45909 Unspecified asthma, uncomplicated: Secondary | ICD-10-CM | POA: Diagnosis present

## 2013-08-12 DIAGNOSIS — Y838 Other surgical procedures as the cause of abnormal reaction of the patient, or of later complication, without mention of misadventure at the time of the procedure: Secondary | ICD-10-CM | POA: Diagnosis present

## 2013-08-12 DIAGNOSIS — Z8542 Personal history of malignant neoplasm of other parts of uterus: Secondary | ICD-10-CM

## 2013-08-12 DIAGNOSIS — F3289 Other specified depressive episodes: Secondary | ICD-10-CM | POA: Diagnosis present

## 2013-08-12 DIAGNOSIS — T82898A Other specified complication of vascular prosthetic devices, implants and grafts, initial encounter: Secondary | ICD-10-CM

## 2013-08-12 HISTORY — PX: BILATERAL UPPER EXTREMITY ANGIOGRAM: SHX5503

## 2013-08-12 LAB — COMPREHENSIVE METABOLIC PANEL
ALT: 8 U/L (ref 0–35)
AST: 12 U/L (ref 0–37)
Albumin: 3 g/dL — ABNORMAL LOW (ref 3.5–5.2)
Alkaline Phosphatase: 70 U/L (ref 39–117)
Anion gap: 10 (ref 5–15)
BUN: 13 mg/dL (ref 6–23)
CO2: 27 mEq/L (ref 19–32)
Calcium: 8.1 mg/dL — ABNORMAL LOW (ref 8.4–10.5)
Chloride: 107 mEq/L (ref 96–112)
Creatinine, Ser: 0.76 mg/dL (ref 0.50–1.10)
GFR calc Af Amer: >90 mL/min (ref >90)
GFR calc non Af Amer: 89 mL/min — ABNORMAL LOW (ref >90)
Glucose, Bld: 83 mg/dL (ref 70–99)
Potassium: 3.4 mEq/L — ABNORMAL LOW (ref 3.7–5.3)
Sodium: 144 mEq/L (ref 137–147)
Total Bilirubin: 0.3 mg/dL (ref 0.3–1.2)
Total Protein: 5.8 g/dL — ABNORMAL LOW (ref 6.0–8.3)

## 2013-08-12 LAB — CBC
HCT: 39.3 % (ref 36.0–46.0)
Hemoglobin: 13.4 g/dL (ref 12.0–15.0)
MCH: 33.4 pg (ref 26.0–34.0)
MCHC: 34.1 g/dL (ref 30.0–36.0)
MCV: 98 fL (ref 78.0–100.0)
Platelets: 207 10*3/uL (ref 150–400)
RBC: 4.01 MIL/uL (ref 3.87–5.11)
RDW: 13.2 % (ref 11.5–15.5)
WBC: 8.3 10*3/uL (ref 4.0–10.5)

## 2013-08-12 LAB — PROTIME-INR
INR: 1.03 (ref 0.00–1.49)
Prothrombin Time: 13.5 seconds (ref 11.6–15.2)

## 2013-08-12 LAB — POCT ACTIVATED CLOTTING TIME: Activated Clotting Time: 281 seconds

## 2013-08-12 SURGERY — BILATERAL UPPER EXTREMITY ANGIOGRAM
Anesthesia: LOCAL | Laterality: Left

## 2013-08-12 MED ORDER — GUAIFENESIN-DM 100-10 MG/5ML PO SYRP: 15.0000 mL | ORAL_SOLUTION | ORAL | Status: DC | PRN

## 2013-08-12 MED ORDER — PANTOPRAZOLE SODIUM 40 MG PO TBEC
40.0000 mg | DELAYED_RELEASE_TABLET | Freq: Every day | ORAL | Status: DC
  Administered 2013-08-12 – 2013-08-14 (×2): 40 mg via ORAL
  Filled 2013-08-12 (×2): qty 1

## 2013-08-12 MED ORDER — ACETAMINOPHEN 325 MG PO TABS
650.0000 mg | ORAL_TABLET | Freq: Four times a day (QID) | ORAL | Status: DC | PRN
  Administered 2013-08-13 – 2013-08-14 (×2): 650 mg via ORAL
  Filled 2013-08-12 (×2): qty 2

## 2013-08-12 MED ORDER — METOPROLOL TARTRATE 1 MG/ML IV SOLN: 2.0000 mg | INTRAVENOUS | Status: DC | PRN

## 2013-08-12 MED ORDER — CEFAZOLIN SODIUM 1-5 GM-% IV SOLN
1.0000 g | Freq: Once | INTRAVENOUS | Status: AC
  Administered 2013-08-13: 2 g via INTRAVENOUS

## 2013-08-12 MED ORDER — FENTANYL CITRATE 0.05 MG/ML IJ SOLN
INTRAMUSCULAR | Status: AC
  Filled 2013-08-12: qty 2

## 2013-08-12 MED ORDER — LABETALOL HCL 5 MG/ML IV SOLN: 10.0000 mg | INTRAVENOUS | Status: DC | PRN

## 2013-08-12 MED ORDER — LIDOCAINE HCL (PF) 1 % IJ SOLN
INTRAMUSCULAR | Status: AC
  Filled 2013-08-12: qty 30

## 2013-08-12 MED ORDER — POLYETHYLENE GLYCOL 3350 17 G PO PACK
17.0000 g | PACK | Freq: Every day | ORAL | Status: DC | PRN
  Filled 2013-08-12: qty 1

## 2013-08-12 MED ORDER — HEPARIN SODIUM (PORCINE) 1000 UNIT/ML IJ SOLN
INTRAMUSCULAR | Status: AC
  Filled 2013-08-12: qty 1

## 2013-08-12 MED ORDER — OXYCODONE HCL 5 MG PO TABS: 5.0000 mg | ORAL_TABLET | ORAL | Status: DC | PRN

## 2013-08-12 MED ORDER — ASPIRIN 325 MG PO TABS
325.0000 mg | ORAL_TABLET | Freq: Every day | ORAL | Status: DC
  Administered 2013-08-14: 325 mg via ORAL
  Filled 2013-08-12 (×2): qty 1

## 2013-08-12 MED ORDER — DOCUSATE SODIUM 100 MG PO CAPS
100.0000 mg | ORAL_CAPSULE | Freq: Two times a day (BID) | ORAL | Status: DC
  Administered 2013-08-12 – 2013-08-14 (×3): 100 mg via ORAL
  Filled 2013-08-12 (×3): qty 1

## 2013-08-12 MED ORDER — SODIUM CHLORIDE 0.9 % IV SOLN: INTRAVENOUS | Status: DC

## 2013-08-12 MED ORDER — ALUM & MAG HYDROXIDE-SIMETH 200-200-20 MG/5ML PO SUSP: 15.0000 mL | ORAL | Status: DC | PRN

## 2013-08-12 MED ORDER — HYDRALAZINE HCL 20 MG/ML IJ SOLN: 10.0000 mg | INTRAMUSCULAR | Status: DC | PRN

## 2013-08-12 MED ORDER — BISACODYL 10 MG RE SUPP: 10.0000 mg | Freq: Every day | RECTAL | Status: DC | PRN

## 2013-08-12 MED ORDER — ONDANSETRON HCL 4 MG/2ML IJ SOLN: 4.0000 mg | Freq: Four times a day (QID) | INTRAMUSCULAR | Status: DC | PRN

## 2013-08-12 MED ORDER — ENOXAPARIN SODIUM 40 MG/0.4ML ~~LOC~~ SOLN
40.0000 mg | SUBCUTANEOUS | Status: DC
  Administered 2013-08-13: 40 mg via SUBCUTANEOUS
  Filled 2013-08-12 (×2): qty 0.4

## 2013-08-12 MED ORDER — HEPARIN (PORCINE) IN NACL 2-0.9 UNIT/ML-% IJ SOLN
INTRAMUSCULAR | Status: AC
  Filled 2013-08-12: qty 1000

## 2013-08-12 MED ORDER — PHENOL 1.4 % MT LIQD
1.0000 | OROMUCOSAL | Status: DC | PRN
  Administered 2013-08-13: 1 via OROMUCOSAL
  Filled 2013-08-12: qty 177

## 2013-08-12 MED ORDER — POTASSIUM CHLORIDE CRYS ER 20 MEQ PO TBCR
20.0000 meq | EXTENDED_RELEASE_TABLET | Freq: Once | ORAL | Status: AC
  Administered 2013-08-12: 20 meq via ORAL
  Filled 2013-08-12: qty 1

## 2013-08-12 MED ORDER — IBUPROFEN 400 MG PO TABS
400.0000 mg | ORAL_TABLET | Freq: Four times a day (QID) | ORAL | Status: DC | PRN
  Filled 2013-08-12: qty 2

## 2013-08-12 MED ORDER — MORPHINE SULFATE 2 MG/ML IJ SOLN: 2.0000 mg | INTRAMUSCULAR | Status: DC | PRN

## 2013-08-12 MED ORDER — PHENOL 1.4 % MT LIQD: 1.0000 | OROMUCOSAL | Status: DC | PRN

## 2013-08-12 MED ORDER — MIDAZOLAM HCL 2 MG/2ML IJ SOLN
INTRAMUSCULAR | Status: AC
  Filled 2013-08-12: qty 2

## 2013-08-12 NOTE — Progress Notes (Signed)
Resting quietly in bed , no acut distress. No chest pain, no sob. Alert oriented. Hob at 20, taking lunch, cath incision at r groin intact, no hematoma, no bleeding, pedal pulse present.

## 2013-08-12 NOTE — Progress Notes (Signed)
ACT 160. Rt femoral sheath removed and pressure held by Nelda Severe RTT. Instructed pt.

## 2013-08-12 NOTE — Progress Notes (Signed)
RECEIVED PT FROM CATH LAB IN NO ACUTE DISTRESS. NO CHEST PAIN, NO SOB. ALERT ORIENTED, DROWSY. HOB FLAT. BANDAGE IN PLACE AT R GROIN INTACT. NO OBVIOUS S/S HEMATOMA, BIL PEDAL PULSES PRESENT. DENIES NEED TO VOID AT THIS TIME. ADVISED PT TO REMAIN FLAT IN BED, UNTIL INSTRUCTED OTHERWISE.

## 2013-08-12 NOTE — Progress Notes (Signed)
Gauze dressing applied on rt groin after 25 min of manual pressure. Level 0.

## 2013-08-12 NOTE — Op Note (Signed)
    Patient name: Kimberly [name] MRN: [id number] DOB: 1950/04/07 Sex: female  08/12/2013 Pre-operative Diagnosis: In stent stenosis, left subclavian artery Post-operative diagnosis:  Same Surgeon:  Eldridge Abrahams Procedure Performed:  1.  ultrasound-guided access, right femoral artery  2.  aortic arch angiogram  3.  drug coated balloon angioplasty, left subclavian artery    Indications:  The patient is previously undergone left subclavian artery stenting.  She has had a recurrence of her symptoms an ultrasound identified stenosis within the previously placed stent.  She's here for further followup and evaluation and possible treatment.  Procedure:  The patient was identified in the holding area and taken to room 8.  The patient was then placed supine on the table and prepped and draped in the usual sterile fashion.  A time out was called.  Ultrasound was used to evaluate the right common femoral artery.  It was patent .  A digital ultrasound image was acquired.  A micropuncture needle was used to access the right common femoral artery under ultrasound guidance.  An 018 wire was advanced without resistance and a micropuncture sheath was placed.  The 018 wire was removed and a benson wire was placed.  The micropuncture sheath was exchanged for a 6 french sheath.  A pigtail catheter was placed into a sitting aorta and aortic arch angiography was performed.  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  The left carotid artery is widely patent.  The stent within the proximal left subclavian artery shows residual stenosis approximately 60-70%.    Intervention:  After the above images were acquired, the decision was made to proceed with intervention.  Using a Berenstein 2 catheter, the left subclavian artery was selected and a Rosen wire was advanced up the left subclavian artery.  The short 6 French  sheath was exchanged out for a 6 x 90 cm sheath which was positioned at the origin of the left subclavian artery.  The patient was then fully heparinized. A wire exchange was then performed and a 014 [place] core wire was placed.  I then selected a 7 x 40 Lutonix drug coated balloon and perform drug coated balloon angioplasty of the in stent stenosis.  The balloon was taken to nominal pressure and held up for 2-1/2 minutes.  Followup angiography revealed resolution of the stenosis.  At this point the long sheath was exchanged out for a short 6 [place] sheath.  The patient taken the holding area for sheath pull once her coagulation profile corrected.  Impression:  #1  successful drug coated balloon angioplasty of in-stent left subclavian artery stenosis using a 7 x 40 Lutonix balloon.  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 Annamarie Major, M.D. [facility] Office: [phone] Pager:  [phone]

## 2013-08-12 NOTE — Progress Notes (Signed)
Pt noted urge to void, bedpan provided. Uop ~300 cc in bedpan. Cath site at r groin remains intact, bandage clean dry. No bladder distention noted. Call bell located on bed.

## 2013-08-12 NOTE — Progress Notes (Signed)
Neuro assessment: groggy, easy to awaken. Speech clear aND APPROPRIATE; upper and lower extremities equal strength; PERRL; smile is symmetrical

## 2013-08-12 NOTE — Progress Notes (Signed)
Patient admitted to 3s08 from cath lab. Vss. Right groin dressing is clean dry and intact with palpable right dp pulse. Safety discussed. Call bell with in reach. Will continue to monitor.

## 2013-08-13 ENCOUNTER — Encounter (HOSPITAL_COMMUNITY)
Admission: RE | Disposition: A | Discharge status: Home / Self Care | Payer: Self-pay | Source: Ambulatory Visit | Attending: Surgery

## 2013-08-13 ENCOUNTER — Ambulatory Visit (HOSPITAL_COMMUNITY): Payer: Medicare Other | Admitting: Certified Registered Nurse Anesthetist

## 2013-08-13 ENCOUNTER — Inpatient Hospital Stay (HOSPITAL_COMMUNITY): Admission: RE | Admit: 2013-08-13 | Payer: Medicare Other | Source: Ambulatory Visit | Admitting: Surgery

## 2013-08-13 ENCOUNTER — Encounter (HOSPITAL_COMMUNITY): Payer: Self-pay | Admitting: Certified Registered Nurse Anesthetist

## 2013-08-13 ENCOUNTER — Encounter (HOSPITAL_COMMUNITY): Payer: Medicare Other | Admitting: Certified Registered Nurse Anesthetist

## 2013-08-13 DIAGNOSIS — I6529 Occlusion and stenosis of unspecified carotid artery: Secondary | ICD-10-CM

## 2013-08-13 HISTORY — PX: CAROTID ENDARTERECTOMY: SUR193

## 2013-08-13 HISTORY — PX: ENDARTERECTOMY: SHX5162

## 2013-08-13 LAB — URINE MICROSCOPIC-ADD ON

## 2013-08-13 LAB — CBC
HCT: 41.5 % (ref 36.0–46.0)
HCT: 37.6 % (ref 36.0–46.0)
Hemoglobin: 13.9 g/dL (ref 12.0–15.0)
Hemoglobin: 12.7 g/dL (ref 12.0–15.0)
MCH: 32.9 pg (ref 26.0–34.0)
MCH: 32.7 pg (ref 26.0–34.0)
MCHC: 33.5 g/dL (ref 30.0–36.0)
MCHC: 33.8 g/dL (ref 30.0–36.0)
MCV: 98.1 fL (ref 78.0–100.0)
MCV: 96.9 fL (ref 78.0–100.0)
Platelets: 234 10*3/uL (ref 150–400)
Platelets: 193 10*3/uL (ref 150–400)
RBC: 4.23 MIL/uL (ref 3.87–5.11)
RBC: 3.88 MIL/uL (ref 3.87–5.11)
RDW: 13.3 % (ref 11.5–15.5)
RDW: 13 % (ref 11.5–15.5)
WBC: 10.6 10*3/uL — ABNORMAL HIGH (ref 4.0–10.5)
WBC: 12.4 10*3/uL — ABNORMAL HIGH (ref 4.0–10.5)

## 2013-08-13 LAB — URINALYSIS, ROUTINE W REFLEX MICROSCOPIC
Bilirubin Urine: NEGATIVE
Glucose, UA: NEGATIVE mg/dL
Ketones, ur: NEGATIVE mg/dL
Leukocytes, UA: NEGATIVE
Nitrite: POSITIVE — AB
Protein, ur: NEGATIVE mg/dL
Specific Gravity, Urine: 1.015 (ref 1.005–1.030)
Urobilinogen, UA: 0.2 mg/dL (ref 0.0–1.0)
pH: 6 (ref 5.0–8.0)

## 2013-08-13 LAB — CREATININE, SERUM
Creatinine, Ser: 0.74 mg/dL (ref 0.50–1.10)
GFR calc Af Amer: >90 mL/min (ref >90)
GFR calc non Af Amer: 90 mL/min — ABNORMAL LOW (ref >90)

## 2013-08-13 LAB — BASIC METABOLIC PANEL
Anion gap: 13 (ref 5–15)
BUN: 16 mg/dL (ref 6–23)
CO2: 25 mEq/L (ref 19–32)
Calcium: 8.8 mg/dL (ref 8.4–10.5)
Chloride: 105 mEq/L (ref 96–112)
Creatinine, Ser: 0.79 mg/dL (ref 0.50–1.10)
GFR calc Af Amer: >90 mL/min (ref >90)
GFR calc non Af Amer: 88 mL/min — ABNORMAL LOW (ref >90)
Glucose, Bld: 94 mg/dL (ref 70–99)
Potassium: 4 mEq/L (ref 3.7–5.3)
Sodium: 143 mEq/L (ref 137–147)

## 2013-08-13 LAB — POCT ACTIVATED CLOTTING TIME
Activated Clotting Time: 168 seconds
Activated Clotting Time: 191 seconds

## 2013-08-13 SURGERY — ENDARTERECTOMY, CAROTID
Anesthesia: General | Site: Neck | Laterality: Right

## 2013-08-13 MED ORDER — MAGNESIUM SULFATE 40 MG/ML IJ SOLN: 2.0000 g | Freq: Every day | INTRAMUSCULAR | Status: DC | PRN

## 2013-08-13 MED ORDER — FLEET ENEMA 7-19 GM/118ML RE ENEM: 1.0000 | ENEMA | Freq: Once | RECTAL | Status: AC | PRN

## 2013-08-13 MED ORDER — LABETALOL HCL 5 MG/ML IV SOLN
INTRAVENOUS | Status: AC
  Filled 2013-08-13: qty 4

## 2013-08-13 MED ORDER — PROTAMINE SULFATE 10 MG/ML IV SOLN
INTRAVENOUS | Status: DC | PRN
  Administered 2013-08-13: 50 mg via INTRAVENOUS

## 2013-08-13 MED ORDER — METOCLOPRAMIDE HCL 5 MG/ML IJ SOLN
INTRAMUSCULAR | Status: DC | PRN
  Administered 2013-08-13: 10 mg via INTRAVENOUS

## 2013-08-13 MED ORDER — ONDANSETRON HCL 4 MG/2ML IJ SOLN
INTRAMUSCULAR | Status: AC
Start: 2013-08-13 — End: 2013-08-13
  Filled 2013-08-13: qty 2

## 2013-08-13 MED ORDER — ESMOLOL HCL 10 MG/ML IV SOLN
INTRAVENOUS | Status: AC
  Filled 2013-08-13: qty 10

## 2013-08-13 MED ORDER — SODIUM CHLORIDE 0.9 % IV SOLN: 500.0000 mL | Freq: Once | INTRAVENOUS | Status: AC | PRN

## 2013-08-13 MED ORDER — CLOPIDOGREL BISULFATE 75 MG PO TABS
75.0000 mg | ORAL_TABLET | Freq: Every day | ORAL | Status: DC
  Administered 2013-08-14: 75 mg via ORAL
  Filled 2013-08-13: qty 1

## 2013-08-13 MED ORDER — DOPAMINE-DEXTROSE 3.2-5 MG/ML-% IV SOLN: 3.0000 ug/kg/min | INTRAVENOUS | Status: DC

## 2013-08-13 MED ORDER — ROCURONIUM BROMIDE 50 MG/5ML IV SOLN
INTRAVENOUS | Status: AC
  Filled 2013-08-13: qty 1

## 2013-08-13 MED ORDER — CEFAZOLIN SODIUM-DEXTROSE 2-3 GM-% IV SOLR
INTRAVENOUS | Status: AC
  Filled 2013-08-13: qty 50

## 2013-08-13 MED ORDER — DEXAMETHASONE SODIUM PHOSPHATE 4 MG/ML IJ SOLN
INTRAMUSCULAR | Status: DC | PRN
  Administered 2013-08-13: 8 mg via INTRAVENOUS

## 2013-08-13 MED ORDER — STERILE WATER FOR INJECTION IJ SOLN
INTRAMUSCULAR | Status: AC
  Filled 2013-08-13: qty 10

## 2013-08-13 MED ORDER — PROTAMINE SULFATE 10 MG/ML IV SOLN
INTRAVENOUS | Status: AC
  Filled 2013-08-13: qty 5

## 2013-08-13 MED ORDER — HEPARIN SODIUM (PORCINE) 1000 UNIT/ML IJ SOLN
INTRAMUSCULAR | Status: DC | PRN
  Administered 2013-08-13: 6000 [IU] via INTRAVENOUS
  Administered 2013-08-13: 2000 [IU] via INTRAVENOUS

## 2013-08-13 MED ORDER — PHENYLEPHRINE HCL 10 MG/ML IJ SOLN
10.0000 mg | INTRAVENOUS | Status: DC | PRN
  Administered 2013-08-13: 10 ug/min via INTRAVENOUS

## 2013-08-13 MED ORDER — DEXAMETHASONE SODIUM PHOSPHATE 4 MG/ML IJ SOLN
INTRAMUSCULAR | Status: AC
  Filled 2013-08-13: qty 2

## 2013-08-13 MED ORDER — 0.9 % SODIUM CHLORIDE (POUR BTL) OPTIME
TOPICAL | Status: DC | PRN
  Administered 2013-08-13: 2000 mL

## 2013-08-13 MED ORDER — FENTANYL CITRATE 0.05 MG/ML IJ SOLN
INTRAMUSCULAR | Status: DC | PRN
  Administered 2013-08-13: 100 ug via INTRAVENOUS
  Administered 2013-08-13 (×3): 50 ug via INTRAVENOUS

## 2013-08-13 MED ORDER — GLYCOPYRROLATE 0.2 MG/ML IJ SOLN
INTRAMUSCULAR | Status: DC | PRN
  Administered 2013-08-13 (×2): 0.2 mg via INTRAVENOUS

## 2013-08-13 MED ORDER — LIDOCAINE HCL (PF) 1 % IJ SOLN
INTRAMUSCULAR | Status: AC
  Filled 2013-08-13: qty 30

## 2013-08-13 MED ORDER — PROMETHAZINE HCL 25 MG/ML IJ SOLN: 6.2500 mg | INTRAMUSCULAR | Status: DC | PRN

## 2013-08-13 MED ORDER — METOCLOPRAMIDE HCL 5 MG/ML IJ SOLN
INTRAMUSCULAR | Status: AC
  Filled 2013-08-13: qty 2

## 2013-08-13 MED ORDER — DEXTROSE 5 % IV SOLN
1.5000 g | Freq: Two times a day (BID) | INTRAVENOUS | Status: AC
  Administered 2013-08-13 – 2013-08-14 (×2): 1.5 g via INTRAVENOUS
  Filled 2013-08-13 (×2): qty 1.5

## 2013-08-13 MED ORDER — POTASSIUM CHLORIDE CRYS ER 20 MEQ PO TBCR: 20.0000 meq | EXTENDED_RELEASE_TABLET | Freq: Every day | ORAL | Status: DC | PRN

## 2013-08-13 MED ORDER — PROPOFOL 10 MG/ML IV BOLUS
INTRAVENOUS | Status: DC | PRN
  Administered 2013-08-13: 30 mg via INTRAVENOUS
  Administered 2013-08-13: 170 mg via INTRAVENOUS

## 2013-08-13 MED ORDER — SENNOSIDES-DOCUSATE SODIUM 8.6-50 MG PO TABS
1.0000 | ORAL_TABLET | Freq: Every evening | ORAL | Status: DC | PRN
  Filled 2013-08-13: qty 1

## 2013-08-13 MED ORDER — GLYCOPYRROLATE 0.2 MG/ML IJ SOLN
INTRAMUSCULAR | Status: AC
  Filled 2013-08-13: qty 2

## 2013-08-13 MED ORDER — THROMBIN 20000 UNITS EX SOLR
CUTANEOUS | Status: AC
  Filled 2013-08-13: qty 20000

## 2013-08-13 MED ORDER — LIDOCAINE HCL (CARDIAC) 20 MG/ML IV SOLN
INTRAVENOUS | Status: DC | PRN
  Administered 2013-08-13: 80 mg via INTRAVENOUS

## 2013-08-13 MED ORDER — NEOSTIGMINE METHYLSULFATE 10 MG/10ML IV SOLN
INTRAVENOUS | Status: DC | PRN
  Administered 2013-08-13: 2 mg via INTRAVENOUS

## 2013-08-13 MED ORDER — PROPOFOL 10 MG/ML IV BOLUS
INTRAVENOUS | Status: AC
  Filled 2013-08-13: qty 20

## 2013-08-13 MED ORDER — LACTATED RINGERS IV SOLN
INTRAVENOUS | Status: DC | PRN
  Administered 2013-08-13 (×2): via INTRAVENOUS

## 2013-08-13 MED ORDER — MORPHINE SULFATE 2 MG/ML IJ SOLN: 1.0000 mg | INTRAMUSCULAR | Status: DC | PRN

## 2013-08-13 MED ORDER — SODIUM CHLORIDE 0.9 % IR SOLN
Status: DC | PRN
  Administered 2013-08-13: 09:00:00

## 2013-08-13 MED ORDER — LABETALOL HCL 5 MG/ML IV SOLN
INTRAVENOUS | Status: DC | PRN
  Administered 2013-08-13: 5 mg via INTRAVENOUS

## 2013-08-13 MED ORDER — ROCURONIUM BROMIDE 100 MG/10ML IV SOLN
INTRAVENOUS | Status: DC | PRN
  Administered 2013-08-13: 50 mg via INTRAVENOUS

## 2013-08-13 MED ORDER — NITROGLYCERIN 0.2 MG/ML ON CALL CATH LAB
INTRAVENOUS | Status: DC | PRN
  Administered 2013-08-13 (×2): 80 ug via INTRAVENOUS

## 2013-08-13 MED ORDER — FENTANYL CITRATE 0.05 MG/ML IJ SOLN
INTRAMUSCULAR | Status: AC
  Filled 2013-08-13: qty 5

## 2013-08-13 MED ORDER — SODIUM CHLORIDE 0.9 % IV SOLN
INTRAVENOUS | Status: DC
  Administered 2013-08-13: 20:00:00 via INTRAVENOUS

## 2013-08-13 MED ORDER — LIDOCAINE HCL (CARDIAC) 20 MG/ML IV SOLN
INTRAVENOUS | Status: AC
Start: 2013-08-13 — End: 2013-08-13
  Filled 2013-08-13: qty 5

## 2013-08-13 MED ORDER — NEOSTIGMINE METHYLSULFATE 10 MG/10ML IV SOLN
INTRAVENOUS | Status: AC
  Filled 2013-08-13: qty 1

## 2013-08-13 MED ORDER — HEPARIN SODIUM (PORCINE) 1000 UNIT/ML IJ SOLN
INTRAMUSCULAR | Status: AC
  Filled 2013-08-13: qty 1

## 2013-08-13 MED ORDER — HEMOSTATIC AGENTS (NO CHARGE) OPTIME
TOPICAL | Status: DC | PRN
  Administered 2013-08-13: 1 via TOPICAL

## 2013-08-13 MED ORDER — ONDANSETRON HCL 4 MG/2ML IJ SOLN
INTRAMUSCULAR | Status: DC | PRN
  Administered 2013-08-13: 4 mg via INTRAVENOUS

## 2013-08-13 SURGICAL SUPPLY — 57 items
ADH SKN CLS APL DERMABOND .7 (GAUZE/BANDAGES/DRESSINGS) ×1
BLADE 10 SAFETY STRL DISP (BLADE) ×1 IMPLANT
CANISTER SUCTION 2500CC (MISCELLANEOUS) ×2 IMPLANT
CATH ROBINSON RED A/P 18FR (CATHETERS) ×2 IMPLANT
CATH SUCT 10FR WHISTLE TIP (CATHETERS) ×2 IMPLANT
CLIP TI MEDIUM 6 (CLIP) ×2 IMPLANT
CLIP TI WIDE RED SMALL 6 (CLIP) ×4 IMPLANT
COVER SURGICAL LIGHT HANDLE (MISCELLANEOUS) ×4 IMPLANT
CRADLE DONUT ADULT HEAD (MISCELLANEOUS) ×2 IMPLANT
DERMABOND ADVANCED (GAUZE/BANDAGES/DRESSINGS) ×1
DERMABOND ADVANCED .7 DNX12 (GAUZE/BANDAGES/DRESSINGS) ×1 IMPLANT
DRAIN CHANNEL 15F RND FF W/TCR (WOUND CARE) IMPLANT
DRAPE PROXIMA HALF (DRAPES) ×1 IMPLANT
DRAPE WARM FLUID 44X44 (DRAPE) ×2 IMPLANT
ELECT REM PT RETURN 9FT ADLT (ELECTROSURGICAL) ×2
ELECTRODE REM PT RTRN 9FT ADLT (ELECTROSURGICAL) ×1 IMPLANT
EVACUATOR SILICONE 100CC (DRAIN) IMPLANT
GAUZE SPONGE 4X4 12PLY STRL (GAUZE/BANDAGES/DRESSINGS) ×1 IMPLANT
GLOVE BIO SURGEON STRL SZ7.5 (GLOVE) ×1 IMPLANT
GLOVE BIOGEL PI IND STRL 7.0 (GLOVE) IMPLANT
GLOVE BIOGEL PI IND STRL 7.5 (GLOVE) ×1 IMPLANT
GLOVE BIOGEL PI IND STRL 8 (GLOVE) IMPLANT
GLOVE BIOGEL PI INDICATOR 7.0 (GLOVE) ×1
GLOVE BIOGEL PI INDICATOR 7.5 (GLOVE) ×4
GLOVE BIOGEL PI INDICATOR 8 (GLOVE) ×1
GLOVE SS BIOGEL STRL SZ 7 (GLOVE) IMPLANT
GLOVE SUPERSENSE BIOGEL SZ 7 (GLOVE) ×2
GLOVE SURG SS PI 7.0 STRL IVOR (GLOVE) ×1 IMPLANT
GLOVE SURG SS PI 7.5 STRL IVOR (GLOVE) ×2 IMPLANT
GOWN STRL REUS W/ TWL LRG LVL3 (GOWN DISPOSABLE) ×2 IMPLANT
GOWN STRL REUS W/ TWL XL LVL3 (GOWN DISPOSABLE) ×1 IMPLANT
GOWN STRL REUS W/TWL LRG LVL3 (GOWN DISPOSABLE) ×6
GOWN STRL REUS W/TWL XL LVL3 (GOWN DISPOSABLE) ×4
HEMOSTAT SNOW SURGICEL 2X4 (HEMOSTASIS) ×1 IMPLANT
INSERT FOGARTY SM (MISCELLANEOUS) IMPLANT
KIT BASIN OR (CUSTOM PROCEDURE TRAY) ×2 IMPLANT
KIT ROOM TURNOVER OR (KITS) ×2 IMPLANT
NS IRRIG 1000ML POUR BTL (IV SOLUTION) ×6 IMPLANT
PACK CAROTID (CUSTOM PROCEDURE TRAY) ×2 IMPLANT
PAD ARMBOARD 7.5X6 YLW CONV (MISCELLANEOUS) ×4 IMPLANT
PATCH VASCULAR VASCU GUARD 1X6 (Vascular Products) ×1 IMPLANT
PROBE PENCIL 8 MHZ STRL DISP (MISCELLANEOUS) ×1 IMPLANT
SHUNT CAROTID BYPASS 10 (VASCULAR PRODUCTS) IMPLANT
SHUNT CAROTID BYPASS 12FRX15.5 (VASCULAR PRODUCTS) IMPLANT
SPONGE INTESTINAL PEANUT (DISPOSABLE) ×2 IMPLANT
SUT ETHILON 3 0 PS 1 (SUTURE) IMPLANT
SUT PROLENE 6 0 BV (SUTURE) ×2 IMPLANT
SUT PROLENE 7 0 BV 1 (SUTURE) IMPLANT
SUT PROLENE 7 0 BV1 MDA (SUTURE) ×1 IMPLANT
SUT SILK 3 0 TIES 17X18 (SUTURE)
SUT SILK 3-0 18XBRD TIE BLK (SUTURE) IMPLANT
SUT VIC AB 3-0 SH 27 (SUTURE) ×4
SUT VIC AB 3-0 SH 27X BRD (SUTURE) ×2 IMPLANT
SUT VICRYL 4-0 PS2 18IN ABS (SUTURE) ×2 IMPLANT
TOWEL OR 17X24 6PK STRL BLUE (TOWEL DISPOSABLE) ×2 IMPLANT
TOWEL OR 17X26 10 PK STRL BLUE (TOWEL DISPOSABLE) ×2 IMPLANT
WATER STERILE IRR 1000ML POUR (IV SOLUTION) ×2 IMPLANT

## 2013-08-13 NOTE — Transfer of Care (Signed)
Immediate Anesthesia Transfer of Care Note  Patient: Kimberly [name]  Procedure(s) Performed: Procedure(s): RIGHT  CAROTID ENDARTERECTOMY CAROTID WITH BOVINE PERICARDIAL PATCH ANGIOPLASTY (Right)  Patient Location: PACU  Anesthesia Type:General  Level of Consciousness: awake, alert  and oriented  Airway & Oxygen Therapy: Patient Spontanous Breathing and Patient connected to nasal cannula oxygen  Post-op Assessment: Report given to PACU RN and Post -op Vital signs reviewed and stable  Post vital signs: Reviewed  Complications: No apparent anesthesia complications

## 2013-08-13 NOTE — Progress Notes (Signed)
  Day of Surgery Note    Subjective:  C/o sore throat.  No problems swallowing  Filed Vitals:   08/13/13 1530  BP: 106/51  Pulse: 63  Temp:   Resp: 15    Incisions:   C/d/i without hematoma Extremities:  Equal bilateral upper and lower extremity Cardiac:  NSR Lungs:  Non labored   Assessment/Plan:  This is a 63 y.o. female who is s/p right CEA  -pt doing well this afternoon -wants wonton soup for dinner-she may have this -continue to increase mobilization -anticipate discharge tomorrow   Leontine Locket, PA-C 08/13/2013 3:46 PM

## 2013-08-13 NOTE — H&P (Signed)
Established Carotid Patient     History of Present Illness   Kimberly [name] is a 63 y.o. female patient of Dr. Trula Slade who is status post stenting of her left subclavian artery on 06/24/2012 in the setting of a symptomatic subclavian steal syndrome.   She is also s/p left CEA on 06/13/11 and aorto-bifemoral bypass graft on 02/26/11.   She returns today for carotid artery Duplex.   Her symptoms of stroke in the past were bilateral partial loss of vision last year for a couple of months right before the left subclavian artery stenting in June, 2014, she has had intermittent blurry vision in both eyes for the last several months and states her vision has been restored. Also having intermittent headaches.   Feels generalized weakness for 1-2 months. Denies hemiparesis symptoms.   Feels exhausted all the time, does not know why she cannot sleep. Denies pain referable to claudication symptoms, but does have chronic right hip to knee pain that she states has not had evaluated yet as she does not have a PCP as she does not have Medicaid; states she has re-applied for, does not have a PCP yet.   She denies any new medical problems.    Pt Diabetic: No   Pt smoker: smoker (<1/2 ppd x 48 yrs), states this is decreased a great deal from 2.5 ppd      Past Medical History   Diagnosis  Date   .  Asthma     .  Hyperlipidemia     .  History of lead poisoning  1977   .  Shortness of breath     .  Peripheral vascular disease         claudication  right greater than left  femoral artery   .  Anxiety     .  Blood transfusion     .  Migraine     .  Subclavian artery disease         left   .  Small bowel obstruction, partial  04/08/11   .  Angina         March 2013   .  Depression     .  Uterine cancer         Pre cervical   .  Fibromyalgia     .  Anemia          Social History History   Substance Use Topics   .  Smoking status:  Current Some Day Smoker -- 0.05 packs/day for 45 years    Types:  Cigarettes       Last Attempt to Quit:  02/24/2011   .  Smokeless tobacco:  Never Used         Comment: pt states that she smokes about 6 cigs per day   .  Alcohol Use:  No        Family History Family History   Problem  Relation  Age of Onset   .  Cancer  Mother         breast, colon, ovarian   .  Anesthesia problems  Neg Hx     .  Cancer  Brother     .  Cancer  Other     .  Stroke  Other     .  Cancer  Maternal Grandmother         ovarian        Surgical History Past Surgical History   Procedure  Laterality  Date   .  Abdominal angiogram       .  Aorta - bilateral femoral artery bypass graft    02/26/2011       Procedure: AORTA BIFEMORAL BYPASS GRAFT;  Surgeon: Theotis Burrow, MD;  Location: [facility];  Service: Vascular;  Laterality: N/A;   .  Pr vein bypass graft,aorto-fem-pop    02/26/11   .  Laparotomy    04/10/2011       Procedure: EXPLORATORY LAPAROTOMY;  Surgeon: Shann Medal, MD;  Location: [facility];  Service: General;  Laterality: N/A;  ENTEROLYSIS OF ADHESIONS   .  Endarterectomy    06/13/2011       Procedure: ENDARTERECTOMY CAROTID;  Surgeon: Serafina Mitchell, MD;  Location: [facility];  Service: Vascular;  Laterality: Left;  Left carotid artery endarterectomy with vascu-guard patch angioplasty   .  Carotid endarterectomy    06/13/2011       Left  CEA   .  Lower extremity angiogram  Left  06/24/12       Left stent intervention, left subclavian   .  Cholecystectomy  N/A  12/08/2012       Procedure: LAPAROSCOPIC CHOLECYSTECTOMY WITH INTRAOPERATIVE CHOLANGIOGRAM;  Surgeon: Haywood Lasso, MD;  Location: [facility];  Service: General;  Laterality: N/A;   .  Abdominal hysterectomy    2008         Allergies   Allergen  Reactions   .  Hydrocodone  Nausea And Vomiting and Other (See Comments)       Headache and dizziness   .  Other  Shortness Of Breath       Cologne   .  Oxycodone  Nausea And Vomiting and Other (See Comments)       Dizziness    .  Percocet  [Oxycodone-Acetaminophen]  Nausea And Vomiting   .  Codeine  Nausea Only       dizziness         Current Outpatient Prescriptions   Medication  Sig  Dispense  Refill   .  acetaminophen (TYLENOL) 325 MG tablet  Take 650 mg by mouth every 6 (six) hours as needed for pain.         Marland Kitchen  aspirin 325 MG tablet  Take 325 mg by mouth daily.         .  clopidogrel (PLAVIX) 75 MG tablet  Take 75 mg by mouth daily.         Marland Kitchen  guaifenesin (ROBITUSSIN) 100 MG/5ML syrup  Take 200 mg by mouth 3 (three) times daily as needed for cough.         Marland Kitchen  ibuprofen (ADVIL,MOTRIN) 200 MG tablet  Take 400-600 mg by mouth every 6 (six) hours as needed for pain.          Marland Kitchen  OVER THE COUNTER MEDICATION  Place 1 application into both nostrils daily as needed (congestion).         .  polyethylene glycol (MIRALAX / GLYCOLAX) packet  Take 17 g by mouth daily as needed (constipation).          .  pseudoephedrine-acetaminophen (TYLENOL SINUS) 30-500 MG TABS  Take 1 tablet by mouth every 4 (four) hours as needed.         .  traMADol (ULTRAM) 50 MG tablet  Take 100 mg by mouth every 8 (eight) hours as needed for pain.  No current facility-administered medications for this visit.        Review of Systems : See HPI for pertinent positives and negatives.   Physical Examination    Filed Vitals:     08/02/13 1407  08/02/13 1409   BP:  123/69  125/74   Pulse:  62     Resp:  20     Height:  5\' 4"  (1.626 m)     Weight:  147 lb 12.8 oz (67.042 kg)        Body mass index is 25.36 kg/(m^2).   General: WDWN in NAD, appears fatigued.   Gait: Normal   HENT: WNL, poor dentitian   Eyes: PERRLA   Pulmonary: normal non-labored breathing , without Rales, rhonchi, Wheezing, decreased air movement in all fields.   Cardiac: RRR, without Murmurs, rubs or gallops;   Abdomen: soft, moderately tender to palpation in all  quadrants, no masses palpated Skin: no rashes, ulcers noted; no Gangrene , no cellulitis; no open  wounds;      VASCULAR EXAM   Carotid Bruits   Left   Right      Positive   Positive     Bilateral radial pulses are 2+ and =.   VASCULAR EXAM:   Extremities without ischemic changes   without Gangrene.   LE Pulses   LEFT   RIGHT    FEMORAL   3+palpable   not palpable    POPLITEAL   not palpable   not palpable    POSTERIOR TIBIAL   2+palpable   1+palpable    DORSALIS PEDIS   ANTERIOR TIBIAL   2+palpable   2+palpable       Musculoskeletal: no muscle wasting or atrophy; no edema   Neurologic: A&O X 3; Appropriate Affect ;   SENSATION: normal;   MOTOR FUNCTION: 5/5 Symmetric  In UE's, 4/5 in LE's, CN 2-12 intact   Speech is fluent/normal     Non-Invasive Vascular Imaging CAROTID DUPLEX 08/02/2013    CEREBROVASCULAR DUPLEX EVALUATION      INDICATION:  Follow-up carotid disease       PREVIOUS INTERVENTION(S):  Left carotid endarterectomy 06/13/2011, left subclavian artery stent placed 06/24/2012      DUPLEX EXAM:        RIGHT    LEFT   Peak Systolic Velocities (cm/s)  End Diastolic Velocities (cm/s)  Plaque  LOCATION  Peak Systolic Velocities (cm/s)  End Diastolic Velocities (cm/s)  Plaque   90  17    CCA PROXIMAL  102  26     83  19    CCA MID  114  36     72  19    CCA DISTAL  101  33     219  30    ECA  301  34     402  135  HT  ICA PROXIMAL  134  45     143  41  HT  ICA MID  149  45     63  22    ICA DISTAL  109  38        5.6  ICA / CCA Ratio (PSV)  NA   Antegrade   Vertebral Flow  Abnormal antegrade    542  Brachial Systolic Pressure (mmHg)  100   Within normal limits   Brachial Artery Waveforms  Biphasic       Plaque Morphology:  HM = Homogeneous, HT = Heterogeneous, CP = Calcific Plaque, SP =  Smooth Plaque, IP = Irregular Plaque        ADDITIONAL FINDINGS:  Right subclavian artery velocity: 203cm/second, left subclavian artery velocity: 397 cm/second       IMPRESSION:  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   Widely patent left carotid  endarterectomy without evidence of restenosis or hyperplasia. Right vertebral artery is antegrade, left vertebral artery is abnormal antegrade. Left subclavian stent could not be visualized; however, in light of abnormal vertebral artery waveform, turbulent subclavian artery waveform, and elevated velocities distal to the stent, significant restenosis is suspected.       Compared to the previous exam:   Disease progression of right internal carotid artery and likely new in-stent stenosis of the left subclavian artery.        Assessment: Kimberly [name] is a 63 y.o. female who is status post stenting of her left subclavian artery on 06/24/2012 in the setting of a symptomatic subclavian steal syndrome.  She is also s/p left CEA on 06/13/11 and aorto-bifemoral bypass graft on 02/26/11. Her symptoms of stroke in the past were bilateral partial loss of vision last year for a couple of months right before the left subclavian artery stenting in June, 2014, she has had intermittent blurry vision in both eyes for the last several months and states her vision has been restored. Also having intermittent headaches.   Feels generalized weakness for 1-2 months. Denies hemiparesis symptoms. 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  Widely patent left carotid endarterectomy without evidence of restenosis or hyperplasia. Left subclavian stent could not be visualized; however, in light of abnormal vertebral artery waveform, turbulent subclavian artery waveform, and elevated velocities distal to the stent, significant restenosis is suspected.   Disease progression of right internal carotid artery and likely new in-stent stenosis of the left subclavian artery.   Plan: Schedule for right CEA on 08/13/13, left subclavian stent placement on 08/12/13, Dr. Trula Slade. Referral to ENT and PCP.    Counseled re smoking cessation. I discussed in depth with the patient the nature  of atherosclerosis, and emphasized the importance of maximal medical management including strict control of blood pressure, blood glucose, and lipid levels, obtaining regular exercise, and cessation of smoking.  The patient is aware that without maximal medical management the underlying atherosclerotic disease process will progress, limiting the benefit of any interventions. The patient was given information about stroke prevention and what symptoms should prompt the patient to seek immediate medical care. Thank you for allowing [place] to participate in this patient's care.   Clemon Chambers, RN, MSN, FNP-C [facility] Office: [facility] Physician: Trula Slade   08/02/2013 1:55 PM                  Encounter-Level Documents:

## 2013-08-13 NOTE — Progress Notes (Signed)
Utilization review completed.  

## 2013-08-13 NOTE — Anesthesia Postprocedure Evaluation (Deleted)
  Anesthesia Post-op Note  Patient: Kimberly [name]  Procedure(s) Performed: Procedure(s): RIGHT  CAROTID ENDARTERECTOMY CAROTID WITH BOVINE PERICARDIAL PATCH ANGIOPLASTY (Right)  Patient Location: PACU  Anesthesia Type:General  Level of Consciousness: awake, alert  and oriented  Airway and Oxygen Therapy: Patient Spontanous Breathing and Patient connected to nasal cannula oxygen  Post-op Pain: none  Post-op Assessment: Post-op Vital signs reviewed and Patient's Cardiovascular Status Stable  Post-op Vital Signs: Reviewed  Last Vitals:  Filed Vitals:   08/13/13 0730  BP:   Pulse: 59  Temp: 36.6 C  Resp: 19    Complications: No apparent anesthesia complications

## 2013-08-13 NOTE — Op Note (Signed)
Patient name: Kimberly [name] MRN: [id number] DOB: [birth year] Sex: female  08/12/2013 - 08/13/2013 Pre-operative Diagnosis: Asymptomatic   right carotid stenosis Post-operative diagnosis:  Same Surgeon:  Eldridge Abrahams Assistants:  Lennie Muckle Procedure:    right carotid Endarterectomy with bovine pericardial  patch angioplasty Anesthesia:  General Blood Loss:  See anesthesia record Specimens:  Carotid Plaque to pathology  Findings:  85 %stenosis; Thrombus:  none  Indications:  Patient with > 80% asymptomatic stenosis by ultrasound  Procedure:  The patient was identified in the holding area and taken to [place] [facility]  The patient was then placed supine on the table.   General endotrachial anesthesia was administered.  The patient was prepped and draped in the usual sterile fashion.  A time out was called and antibiotics were administered.  The incision was made along the anterior border of the right sternocleidomastoid muscle.  Cautery was used to dissect through the subcutaneous tissue.  The platysma muscle was divided with cautery.  The internal jugular vein was exposed along its anterior medial border.  The common facial vein was exposed and then divided between 2-0 silk ties and metal clips.  The common carotid artery was then circumferentially exposed and encircled with an umbilical tape.  The vagus nerve was identified and protected.  Next sharp dissection was used to expose the external carotid artery and the superior thyroid artery.  The were encircled with a blue vessel loop and a 2-0 silk tie respectively.  Finally, the internal carotid was carefully dissected free.  An umbilical tape was placed around the internal carotid artery distal to the diseased segment.  The hypoglossal nerve was visualized throughout and protected.  The patient was given systemic heparinization.  A bovine carotid patch was selected and prepared on the back table.  A 10 french shunt was also prepared.  After  blood pressure readings were appropriate and the heparin had been given time to circulate, the internal carotid artery was occluded with a baby Gregory clamp.  The external and common carotid arteries were then occluded with vascular clamps and the 2-0 tie tightened on the superior thyroid artery.  A #11 blade was used to make an arteriotomy in the common carotid artery.  This was extended with Potts scissors along the anterior and lateral border of the common and internal carotid artery.  Approximately 85% stenosis was identified.  There was no thrombus identified.  The 10 french shunt was not placed, as there was excellent backbleeding, and the lesion extended up under the hypoglossal which would have made visualization difficult.  A kleiner kuntz elevator was used to perform endarterectomy.  An eversion endarterectomy was performed in the external carotid artery.  A good distal endpoint was obtained in the internal carotid artery.  The specimen was removed and sent to pathology.  Heparinized saline was used to irrigate the endarterectomized field.  All potential embolic debris was removed.  Bovine pericardial patch angioplasty was then performed using a running 6-0 Prolene.  The common internal and external carotid arteries were all appropriately flushed. The artery was again irrigated with heparin saline.  The anastomosis was then secured. The clamp was first released on the external carotid artery followed by the common carotid artery approximately 30 seconds later, bloodflow was reestablish through the internal carotid artery.  Next, a hand-held  Doppler was used to evaluate the signals in the common, external, and internal  carotid arteries, all of which had appropriate signals. I  then administered  50 mg protamine. The wound was then irrigated.  After hemostasis was achieved, the carotid sheath was reapproximated with 3-0 Vicryl. The  platysma muscle was reapproximated with running 3-0 Vicryl. The skin    was closed with 4-0 Vicryl. Dermabond was placed on the skin. The  patient was then successfully extubated. Her neurologic exam was  similar to his preprocedural exam. The patient was then taken to recovery room  in stable condition. There were no complications.     Disposition:  To PACU in stable condition.  Relevant Operative Details:  Normal anatomy.  Lesion extended into ICA about 2.0 cm.  No shunt was used, as there was excellent backbleeding, and the lesion extended up under the hypoglossal which would have made visualization difficult.  Theotis Burrow, M.D. [facility] Office: [phone] Pager:  [phone]

## 2013-08-13 NOTE — Anesthesia Postprocedure Evaluation (Signed)
  Anesthesia Post-op Note  Patient: Kimberly [name]  Procedure(s) Performed: Procedure(s): RIGHT  CAROTID ENDARTERECTOMY CAROTID WITH BOVINE PERICARDIAL PATCH ANGIOPLASTY (Right)  Patient Location: PACU  Anesthesia Type:General  Level of Consciousness: awake and alert   Airway and Oxygen Therapy: Patient Spontanous Breathing  Post-op Pain: mild  Post-op Assessment: Post-op Vital signs reviewed and Patient's Cardiovascular Status Stable  Post-op Vital Signs: stable  Last Vitals:  Filed Vitals:   08/13/13 1249  BP: 96/49  Pulse: 58  Temp:   Resp: 14    Complications: No apparent anesthesia complications

## 2013-08-13 NOTE — Anesthesia Preprocedure Evaluation (Signed)
Anesthesia Evaluation  Patient identified by MRN, date of birth, ID band Patient awake    Reviewed: Allergy & Precautions, H&P , NPO status , Patient's Chart, lab work & pertinent test results  Airway Mallampati: I TM Distance: >3 FB Neck ROM: Full    Dental   Pulmonary asthma , Current Smoker,  breath sounds clear to auscultation        Cardiovascular + Peripheral Vascular Disease Rhythm:Regular Rate:Normal     Neuro/Psych    GI/Hepatic   Endo/Other    Renal/GU      Musculoskeletal  (+) Fibromyalgia -  Abdominal   Peds  Hematology  (+) anemia ,   Anesthesia Other Findings   Reproductive/Obstetrics                           Anesthesia Physical Anesthesia Plan  ASA: III  Anesthesia Plan: General   Post-op Pain Management:    Induction: Intravenous  Airway Management Planned: Oral ETT  Additional Equipment: Arterial line  Intra-op Plan:   Post-operative Plan: Extubation in OR  Informed Consent: I have reviewed the patients History and Physical, chart, labs and discussed the procedure including the risks, benefits and alternatives for the proposed anesthesia with the patient or authorized representative who has indicated his/her understanding and acceptance.   Dental advisory given  Plan Discussed with: CRNA and Surgeon  Anesthesia Plan Comments:         Anesthesia Quick Evaluation

## 2013-08-13 NOTE — Progress Notes (Signed)
Pt transferred from PACU with RN and NT on monitor. Pt VSS, drowsy but arousable, and pt neuro intact. Right neck soft, and skin glue closure intact. CCMD and elink made aware of pt return.

## 2013-08-13 NOTE — Progress Notes (Signed)
Pt demanded to have a drink. Pt was educated on why she was NPO and that she was unable to eat or drink at this time and offered a mouth swab. Pt started to yell and stated she was getting a drink. She took the cup of water and drank 1/4 cup. The cup and all food and drink was removed from bedside. Will continue to monitor pt.

## 2013-08-14 LAB — CBC
HCT: 36.7 % (ref 36.0–46.0)
Hemoglobin: 12.6 g/dL (ref 12.0–15.0)
MCH: 33.1 pg (ref 26.0–34.0)
MCHC: 34.3 g/dL (ref 30.0–36.0)
MCV: 96.3 fL (ref 78.0–100.0)
Platelets: 205 10*3/uL (ref 150–400)
RBC: 3.81 MIL/uL — ABNORMAL LOW (ref 3.87–5.11)
RDW: 13 % (ref 11.5–15.5)
WBC: 17.6 10*3/uL — ABNORMAL HIGH (ref 4.0–10.5)

## 2013-08-14 LAB — BASIC METABOLIC PANEL
Anion gap: 14 (ref 5–15)
BUN: 11 mg/dL (ref 6–23)
CO2: 23 mEq/L (ref 19–32)
Calcium: 8.4 mg/dL (ref 8.4–10.5)
Chloride: 106 mEq/L (ref 96–112)
Creatinine, Ser: 0.68 mg/dL (ref 0.50–1.10)
GFR calc Af Amer: >90 mL/min (ref >90)
GFR calc non Af Amer: >90 mL/min (ref >90)
Glucose, Bld: 116 mg/dL — ABNORMAL HIGH (ref 70–99)
Potassium: 4.4 mEq/L (ref 3.7–5.3)
Sodium: 143 mEq/L (ref 137–147)

## 2013-08-14 NOTE — Discharge Summary (Signed)
Discharge Summary     Kimberly [name] [birth year] 63 y.o. female  [id number]  Admission Date: 08/12/2013  Discharge Date: 08/14/13  Physician: Serafina Mitchell, MD  Admission Diagnosis: subclavian arterial stenosis Occlusion and stenosis of carotid artery without mention of cerebral infarction    HPI:   This is a 63 y.o. female patient of Dr. Trula Slade who is status post stenting of her left subclavian artery on 06/24/2012 in the setting of a symptomatic subclavian steal syndrome.  She is also s/p left CEA on 06/13/11 and aorto-bifemoral bypass graft on 02/26/11.  She returns today for carotid artery Duplex.  Her symptoms of stroke in the past were bilateral partial loss of vision last year for a couple of months right before the left subclavian artery stenting in June, 2014, she has had intermittent blurry vision in both eyes for the last several months and states her vision has been restored. Also having intermittent headaches.  Feels generalized weakness for 1-2 months.  Denies hemiparesis symptoms.  Feels exhausted all the time, does not know why she cannot sleep.  Denies pain referable to claudication symptoms, but does have chronic right hip to knee pain that she states has not had evaluated yet as she does not have a PCP as she does not have Medicaid; states she has re-applied for, does not have a PCP yet.  She denies any new medical problems.   Hospital Course:  The patient was admitted to the hospital and taken to the [facility] lab and underwent: 1. ultrasound-guided access, right femoral artery  2. aortic arch angiogram  3. drug coated balloon angioplasty, left subclavian artery  She was taken to the operating room on 08/12/2013 - 08/13/2013 and underwent right carotid endarterectomy.  The pt tolerated the procedure well and was transported to the PACU in good condition.   By POD 1, the pt neuro status was in tact.  She was doing well.  The remainder of the hospital course consisted  of increasing mobilization and increasing intake of solids without difficulty.    Recent Labs  08/13/13 0228 08/14/13 0245  NA 143 143  K 4.0 4.4  CL 105 106  CO2 25 23  GLUCOSE 94 116*  BUN 16 11  CALCIUM 8.8 8.4    Recent Labs  08/13/13 1300 08/14/13 0245  WBC 12.4* 17.6*  HGB 12.7 12.6  HCT 37.6 36.7  PLT 193 205    Recent Labs  08/12/13 1720  INR 1.03    Discharge Instructions:   The patient is discharged with extensive instructions on wound care and progressive ambulation.  They are instructed not to drive or perform any heavy lifting until returning to see the physician in his office.  Discharge Instructions   CAROTID Sugery: Call MD for difficulty swallowing or speaking; weakness in arms or legs that is a new symtom; severe headache.  If you have increased swelling in the neck and/or  are having difficulty breathing, CALL 911    Complete by:  As directed      Call MD for:  redness, tenderness, or signs of infection (pain, swelling, bleeding, redness, odor or green/yellow discharge around incision site)    Complete by:  As directed      Call MD for:  severe or increased pain, loss or decreased feeling  in affected limb(s)    Complete by:  As directed      Call MD for:  temperature >100.5    Complete by:  As directed  Discharge wound care:    Complete by:  As directed   Shower daily with soap and water starting 08/15/13     Driving Restrictions    Complete by:  As directed   No driving for 2 weeks     Lifting restrictions    Complete by:  As directed   No lifting for 2 weeks     Resume previous diet    Complete by:  As directed            Discharge Diagnosis:  subclavian arterial stenosis Occlusion and stenosis of carotid artery without mention of cerebral infarction   Secondary Diagnosis: Patient Active Problem List   Diagnosis Date Noted  . Carotid stenosis 08/12/2013  . 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 Numbness-Bilateral arms  01/11/2013  . Cholecystitis, acute 12/05/2012  . Weakness 06/29/2012  . Pain in limb-Left neck,shoulder and arm 06/29/2012  . Aftercare following surgery of the circulatory system, [place] 12/09/2011  . Subclavian arterial stenosis 05/27/2011  . Small bowel obstruction due to adhesions.  Enterolysis 04/10/2011. 05/02/2011  . Pain in the abdomen 04/08/2011  . Ileus 04/08/2011  . PVD (peripheral vascular disease) 04/08/2011  . 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 Occlusion and stenosis of carotid artery without mention of cerebral infarction 03/25/2011  . Atherosclerosis of native arteries of the extremities with intermittent claudication 03/14/2011  . PVD (peripheral vascular disease) with claudication 02/06/2011  . Subclavian artery stenosis 01/28/2011  . PAP SMEAR, LGSIL, ABNORMAL 03/30/2010  . HYPERCHOLESTEROLEMIA 03/28/2010  . INTERMITTENT CLAUDICATION 03/27/2010  . CYSTITIS, ACUTE 03/27/2010  . LYMPHADENOPATHY 02/06/2010  . TOBACCO ABUSE 12/07/2009  . DENTAL PAIN 12/07/2009  . MENOPAUSE, SURGICAL 01/07/2005   Past Medical History  Diagnosis Date  . Asthma   . Hyperlipidemia   . History of lead poisoning 1977  . Shortness of breath   . Peripheral vascular disease     claudication  right greater than left  femoral artery  . Anxiety   . Blood transfusion   . Migraine   . Subclavian artery disease     left  . Small bowel obstruction, partial 04/08/11  . Angina     March 2013  . Depression   . Uterine cancer     Pre cervical  . Fibromyalgia   . Anemia       Medication List         acetaminophen 325 MG tablet  Commonly known as:  TYLENOL  Take 650 mg by mouth every 6 (six) hours as needed for pain.     aspirin 325 MG tablet  Take 325 mg by mouth daily.     clopidogrel 75 MG tablet  Commonly known as:  PLAVIX  Take 75 mg by mouth daily.     ibuprofen 200 MG tablet  Commonly known as:  ADVIL,MOTRIN  Take 400-600 mg by mouth every 6 (six) hours as needed for  pain.     polyethylene glycol packet  Commonly known as:  MIRALAX / GLYCOLAX  Take 17 g by mouth daily as needed (constipation).        No Rx for pain medication prescribed--she is allergic and wants to take Tylenol  She is not on a statin as she states this has made her nauseated and have vomiting in the past.   Disposition: home  Patient's condition: is Good  Follow up: 1. Dr.  Trula Slade in 2 weeks.   Leontine Locket, PA-C [facility] [phone]  --- For [facility]  Registry use --- Instructions: Press F2 to tab through selections.  Delete question if not applicable.   Modified Rankin score at D/C (0-6): 0   IV medication needed for:  1. Hypertension: No 2. Hypotension: No  Post-op Complications: No  1. Post-op CVA or TIA: No  If yes: Event classification (right eye, left eye, right cortical, left cortical, verterobasilar, other): n/a  If yes: Timing of event (intra-op, <6 hrs post-op, >=6 hrs post-op, unknown): n/a  2. CN injury: No  If yes: CN n/a injuried   3. Myocardial infarction: No  If yes: Dx by (EKG or clinical, Troponin): n/a  4.  CHF: No  5.  Dysrhythmia (new): No  6. Wound infection: No  7. Reperfusion symptoms: No  8. Return to OR: No  If yes: return to OR for (bleeding, neurologic, other CEA incision, other): n/a  Discharge medications: Statin use:  No If No: [x]  For Medical reasons-nauseated and vomiting, [ ]  Non-compliant, [ ]  Not-indicated ASA use:  Yes  If No: [ ]  For Medical reasons, [ ]  Non-compliant, [ ]  Not-indicated Beta blocker use:  No If No: [ ]  For Medical reasons, [ ]  Non-compliant, [ ]  Not-indicated ACE-Inhibitor use:  No If No: [ ]  For Medical reasons, [ ]  Non-compliant, [ ]  Not-indicated P2Y12 Antagonist use: Yes, [ x] Plavix, [ ]  Plasugrel, [ ]  Ticlopinine, [ ]  Ticagrelor, [ ]  Other, [ ]  No for medical reason, [ ]  Non-compliant, [ ]  Not-indicated Anti-coagulant use:  No, [ ]  Warfarin, [ ]  Rivaroxaban, [ ]   Dabigatran, [ ]  Other, [ ]  No for medical reason, [ ]  Non-compliant, [ ]  Not-indicated   agree with above

## 2013-08-14 NOTE — Progress Notes (Signed)
Incision tender to touch. Small edematous area under incision noted. No changes in size of hematoma at incision or edema of site throughout night. Tongue is midline, face is symmetrical, speech is normal. Patient c/o throat being sore, chloraseptic spray administered for comfort. Assessment unchanged from start of shift

## 2013-08-14 NOTE — Progress Notes (Signed)
Patient being discharged home per MD order. All discharge instructions given to patient and verbalized understanding.

## 2013-08-14 NOTE — Progress Notes (Addendum)
  Progress Note    08/14/2013 7:48 AM 1 Day Post-Op  Subjective:  C/o of fullness and soreness at incision site  Afebrile HR 50's-80's regular 32'Z-224'M systolic 25% RA  Filed Vitals:   08/14/13 0733  BP:   Pulse: 56  Temp: 97.8 F (36.6 C)  Resp: 20     Physical Exam: Neuro:  In tact Incision:  C/d/i with minimal ecchymosis; mild fullness; right groin soft Extremities:  2+ left radial pulse  CBC    Component Value Date/Time   WBC 17.6* 08/14/2013 0245   RBC 3.81* 08/14/2013 0245   HGB 12.6 08/14/2013 0245   HCT 36.7 08/14/2013 0245   PLT 205 08/14/2013 0245   MCV 96.3 08/14/2013 0245   MCH 33.1 08/14/2013 0245   MCHC 34.3 08/14/2013 0245   RDW 13.0 08/14/2013 0245   LYMPHSABS 1.1 04/12/2011 0336   MONOABS 0.6 04/12/2011 0336   EOSABS 0.2 04/12/2011 0336   BASOSABS 0.0 04/12/2011 0336    BMET    Component Value Date/Time   NA 143 08/14/2013 0245   K 4.4 08/14/2013 0245   CL 106 08/14/2013 0245   CO2 23 08/14/2013 0245   GLUCOSE 116* 08/14/2013 0245   BUN 11 08/14/2013 0245   CREATININE 0.68 08/14/2013 0245   CALCIUM 8.4 08/14/2013 0245   GFRNONAA >90 08/14/2013 0245   GFRAA >90 08/14/2013 0245     Intake/Output Summary (Last 24 hours) at 08/14/13 0748 Last data filed at 08/14/13 0734  Gross per 24 hour  Intake   2095 ml  Output   1800 ml  Net    295 ml      Assessment/Plan:  This is a 63 y.o. female who is s/p right CEA 1 Day Post-Op and drug coated balloon angioplasty left subclavian artery 2 Day's Post-Op  -pt is doing well this am. -pt neuro exam is in tact -pt has not ambulated-will get pt up to chair and ambulate in halls.  Discussed with RN. -pt has voided -home later this am.   Leontine Locket, PA-C [facility] [phone] Agree with above assessment Right neck incision healing nicely Neurologic exam normal 3+ left radial pulse palpable  Plan DC home today and return to see Dr. Trula Slade in 2 weeks

## 2013-08-16 ENCOUNTER — Encounter (HOSPITAL_COMMUNITY): Payer: Self-pay | Admitting: Surgery

## 2013-08-16 NOTE — Care Management Note (Signed)
    Page 1 of 1   08/16/2013     12:31:29 PM CARE MANAGEMENT NOTE 08/16/2013  Patient:  Kimberly [name], Kimberly [name]   Account Number:  [id number]  Date Initiated:  08/16/2013  Documentation initiated by:  Marvetta Gibbons  Subjective/Objective Assessment:   Pt admitted s/p CEA     Action/Plan:   PTA pt lived at home   Anticipated DC Date:  08/14/2013   Anticipated DC Plan:  HOME/SELF CARE         Choice offered to / List presented to:             Status of service:  Completed, signed off Medicare Important Message given?  NA - LOS <3 / Initial given by admissions (If response is "NO", the following Medicare IM given date fields will be blank) Date Medicare IM given:   Medicare IM given by:   Date Additional Medicare IM given:   Additional Medicare IM given by:    Discharge Disposition:  HOME/SELF CARE  Per UR Regulation:  Reviewed for med. necessity/level of care/duration of stay  If discussed at [place] of Stay Meetings, dates discussed:    Comments:

## 2013-08-19 ENCOUNTER — Encounter: Payer: Self-pay | Admitting: Family

## 2013-08-19 ENCOUNTER — Ambulatory Visit (INDEPENDENT_AMBULATORY_CARE_PROVIDER_SITE_OTHER): Payer: Medicare Other | Admitting: Family

## 2013-08-19 VITALS — BP 139/83 | HR 82 | Temp 97.0°F | Resp 16 | Ht 64.0 in | Wt 145.0 lb

## 2013-08-19 DIAGNOSIS — M7989 Other specified soft tissue disorders: Secondary | ICD-10-CM

## 2013-08-19 DIAGNOSIS — M79601 Pain in right arm: Secondary | ICD-10-CM

## 2013-08-19 DIAGNOSIS — Z5189 Encounter for other specified aftercare: Secondary | ICD-10-CM

## 2013-08-19 DIAGNOSIS — R200 Anesthesia of skin: Secondary | ICD-10-CM

## 2013-08-19 DIAGNOSIS — L24A9 Irritant contact dermatitis due friction or contact with other specified body fluids: Secondary | ICD-10-CM

## 2013-08-19 DIAGNOSIS — T148XXA Other injury of unspecified body region, initial encounter: Secondary | ICD-10-CM | POA: Insufficient documentation

## 2013-08-19 DIAGNOSIS — M79609 Pain in unspecified limb: Secondary | ICD-10-CM

## 2013-08-19 DIAGNOSIS — R209 Unspecified disturbances of skin sensation: Secondary | ICD-10-CM

## 2013-08-19 NOTE — Progress Notes (Signed)
Established Carotid Patient   History of Present Illness  Kimberly [name] is a 63 y.o. female patient of Dr. Trula Slade who is status post Right Carotid Endarterectomy on 08-13-13 for 85% stenosis (VWB).  She returns today for C/O Right neck drainage 1-2 days. Swelling, numbness and pain, 4 days at right CEA site. She denies fever or chills, denies dyspnea, positive for mild dysphagia. Pt states she spoke with Dr. Kellie Simmering 4 days ago re the black discoloration at the right CEA site, she applied ice pack as advised and states the black resolved but now is concerned about the moderate swelling and scant bloody drainage. She feels tired, felt tired pre-op. She does not like narcotic analgesics and is using Tylenol for post operative pain.   She is also s/p stenting of her left subclavian artery on 06/24/2012 in the setting of a symptomatic subclavian steal syndrome.  She is also s/p left CEA on 06/13/11 and aorto-bifemoral bypass graft on 02/26/11.   Her symptoms of stroke in the past were bilateral partial loss of vision last year for a couple of months right before the left subclavian artery stenting in June, 2014, she has had intermittent blurry vision in both eyes for the last several months and states her vision has been restored. Also having intermittent headaches.  Feels generalized weakness for 1-2 months prior to the right CEA.  Denies hemiparesis symptoms.  Feels exhausted all the time, does not know why she cannot sleep, before the right CEA.  Denies pain referable to claudication symptoms, but does have chronic right hip to knee pain that she states has not had evaluated yet as she does not have a PCP as she does not have Medicaid; states she has re-applied for, does not have a PCP yet.  She denies any new medical problems.   Pt Diabetic: No  Pt smoker: smoker (<1/2 ppd x 48 yrs), states this is decreased a great deal from 2.5 ppd   Past Medical History  Diagnosis Date  . Asthma   .  Hyperlipidemia   . History of lead poisoning 1977  . Shortness of breath   . Peripheral vascular disease     claudication  right greater than left  femoral artery  . Anxiety   . Blood transfusion   . Migraine   . Subclavian artery disease     left  . Small bowel obstruction, partial 04/08/11  . Angina     March 2013  . Depression   . Uterine cancer     Pre cervical  . Fibromyalgia   . Anemia     Social History History  Substance Use Topics  . Smoking status: Current Some Day Smoker -- 0.05 packs/day for 45 years    Types: Cigarettes  . Smokeless tobacco: Never Used     Comment: pt states that she smokes about 6 cigs per day  . Alcohol Use: No    Family History Family History  Problem Relation Age of Onset  . Cancer Mother     breast, colon, ovarian  . Anesthesia problems Neg Hx   . Cancer Brother 50    lung  . Hyperlipidemia Brother   . Cancer Other   . Stroke Other   . Cancer Maternal Grandmother     ovarian    Surgical History Past Surgical History  Procedure Laterality Date  . Abdominal angiogram    . Aorta - bilateral femoral artery bypass graft  02/26/2011    Procedure: AORTA BIFEMORAL  BYPASS GRAFT;  Surgeon: Theotis Burrow, MD;  Location: [facility] OR;  Service: Vascular;  Laterality: N/A;  . Pr vein bypass graft,aorto-fem-pop  02/26/11  . Laparotomy  04/10/2011    Procedure: EXPLORATORY LAPAROTOMY;  Surgeon: Shann Medal, MD;  Location: [facility];  Service: General;  Laterality: N/A;  ENTEROLYSIS OF ADHESIONS  . Endarterectomy  06/13/2011    Procedure: ENDARTERECTOMY CAROTID;  Surgeon: Serafina Mitchell, MD;  Location: [facility] OR;  Service: Vascular;  Laterality: Left;  Left carotid artery endarterectomy with vascu-guard patch angioplasty  . Lower extremity angiogram Left 06/24/12    Left stent intervention, left subclavian  . Cholecystectomy N/A 12/08/2012    Procedure: LAPAROSCOPIC CHOLECYSTECTOMY WITH INTRAOPERATIVE CHOLANGIOGRAM;  Surgeon: Haywood Lasso, MD;   Location: [facility];  Service: General;  Laterality: N/A;  . Abdominal hysterectomy  2008  . Endarterectomy Right 08/13/2013    Procedure: RIGHT  CAROTID ENDARTERECTOMY CAROTID WITH BOVINE PERICARDIAL PATCH ANGIOPLASTY;  Surgeon: Serafina Mitchell, MD;  Location: [facility] OR;  Service: Vascular;  Laterality: Right;  . Carotid endarterectomy Left 06/13/2011    Left  CEA  . Carotid endarterectomy Right 08-13-13    CEA    Allergies  Allergen Reactions  . Hydrocodone Nausea And Vomiting and Other (See Comments)    Headache and dizziness  . Other Shortness Of Breath    Cologne  . Oxycodone Nausea And Vomiting and Other (See Comments)    Dizziness   . Percocet [Oxycodone-Acetaminophen] Nausea And Vomiting  . Codeine Nausea Only    dizziness    Current Outpatient Prescriptions  Medication Sig Dispense Refill  . acetaminophen (TYLENOL) 325 MG tablet Take 650 mg by mouth every 6 (six) hours as needed for pain.      Marland Kitchen aspirin 325 MG tablet Take 325 mg by mouth daily.      . clopidogrel (PLAVIX) 75 MG tablet Take 75 mg by mouth daily.      Marland Kitchen ibuprofen (ADVIL,MOTRIN) 200 MG tablet Take 400-600 mg by mouth every 6 (six) hours as needed for pain.       . polyethylene glycol (MIRALAX / GLYCOLAX) packet Take 17 g by mouth daily as needed (constipation).       No current facility-administered medications for this visit.    Review of Systems : See HPI for pertinent positives and negatives.  Physical Examination  Filed Vitals:   08/19/13 1545  BP: 139/83  Pulse: 82  Temp: 97 F (36.1 C)  TempSrc: Oral  Resp: 16  Height: 5\' 4"  (1.626 m)  Weight: 145 lb (65.772 kg)  SpO2: 98%   Body mass index is 24.88 kg/(m^2).  General: WDWN in NAD, appears fatigued.  Gait: Normal  HENT:  poor dentition, erythema without purulence in pharynx. Right CEA site: Moderate swelling at cephalic end of incision, no erythema, no drainage. Eyes: Pupils equal Pulmonary: normal non-labored breathing , without Rales, rhonchi,  Wheezing, decreased air movement in all fields.  Cardiac: RRR, without Murmurs, rubs or gallops;  Abdomen: soft, moderately tender to palpation in all quadrants, no masses palpated  Skin: no rashes, or ulcers noted; no Gangrene , no cellulitis; no open wounds;  VASCULAR EXAM  Carotid Bruits  Left  Right    Positive  Positive   Bilateral radial pulses are 2+ and =.  VASCULAR EXAM:  Extremities without ischemic changes  without Gangrene.  LE Pulses  LEFT  RIGHT   FEMORAL  3+palpable  not palpable   POPLITEAL  not palpable  not palpable   POSTERIOR TIBIAL  2+palpable  1+palpable   DORSALIS PEDIS  ANTERIOR TIBIAL  2+palpable  2+palpable   Musculoskeletal: no muscle wasting or atrophy; no edema  Neurologic: A&O X 3; Appropriate Affect ;  SENSATION: normal;  MOTOR FUNCTION: 5/5 Symmetric In UE's, 4/5 in LE's, CN 2-12 intact  Speech is fluent/normal   Assessment: Kimberly [name] is a 63 y.o. female who is status post Right Carotid Endarterectomy on 08-13-13 for 85% stenosis. She returns today for concern about right CEA site and feeling tired.  Dr. Bridgett Larsson spoke with and examined pt. Normal amount of swelling at right CEA site. Has erythema in oral pharynx, feels tired, likely has viral URI or post nasal drip secondary to environmental allergies. She was feeling tired for a few weeks prior to the right CEA. She was again counseled re smoking cessation.   Plan: Follow-up with Dr. Trula Slade on 08/30/13 for 2 weeks post op check.  I discussed in depth with the patient the nature of atherosclerosis, and emphasized the importance of maximal medical management including strict control of blood pressure, blood glucose, and lipid levels, obtaining regular exercise, and cessation of smoking.  The patient is aware that without maximal medical management the underlying atherosclerotic disease process will progress, limiting the benefit of any interventions. The patient was given information about stroke  prevention and what symptoms should prompt the patient to seek immediate medical care. Thank you for allowing [place] to participate in this patient's care.  Clemon Chambers, RN, MSN, FNP-C [facility] [place] Office: [phone]  Clinic Physician: Bridgett Larsson  08/19/2013 4:03 PM

## 2013-08-28 ENCOUNTER — Observation Stay (HOSPITAL_COMMUNITY)
Admission: EM | Admit: 2013-08-28 | Discharge: 2013-08-29 | Disposition: A | Discharge status: Home / Self Care | Payer: Medicare Other | Attending: Surgery | Admitting: Surgery

## 2013-08-28 ENCOUNTER — Encounter (HOSPITAL_COMMUNITY): Payer: Self-pay | Admitting: Emergency Medicine

## 2013-08-28 DIAGNOSIS — Z800 Family history of malignant neoplasm of digestive organs: Secondary | ICD-10-CM | POA: Diagnosis not present

## 2013-08-28 DIAGNOSIS — Z803 Family history of malignant neoplasm of breast: Secondary | ICD-10-CM | POA: Diagnosis not present

## 2013-08-28 DIAGNOSIS — F411 Generalized anxiety disorder: Secondary | ICD-10-CM | POA: Insufficient documentation

## 2013-08-28 DIAGNOSIS — R0602 Shortness of breath: Secondary | ICD-10-CM | POA: Diagnosis not present

## 2013-08-28 DIAGNOSIS — Z885 Allergy status to narcotic agent status: Secondary | ICD-10-CM | POA: Insufficient documentation

## 2013-08-28 DIAGNOSIS — Z7982 Long term (current) use of aspirin: Secondary | ICD-10-CM | POA: Insufficient documentation

## 2013-08-28 DIAGNOSIS — T148XXA Other injury of unspecified body region, initial encounter: Secondary | ICD-10-CM

## 2013-08-28 DIAGNOSIS — Z79899 Other long term (current) drug therapy: Secondary | ICD-10-CM | POA: Insufficient documentation

## 2013-08-28 DIAGNOSIS — F329 Major depressive disorder, single episode, unspecified: Secondary | ICD-10-CM | POA: Insufficient documentation

## 2013-08-28 DIAGNOSIS — I6522 Occlusion and stenosis of left carotid artery: Secondary | ICD-10-CM

## 2013-08-28 DIAGNOSIS — T8149XA Infection following a procedure, other surgical site, initial encounter: Secondary | ICD-10-CM | POA: Diagnosis present

## 2013-08-28 DIAGNOSIS — G43909 Migraine, unspecified, not intractable, without status migrainosus: Secondary | ICD-10-CM | POA: Diagnosis not present

## 2013-08-28 DIAGNOSIS — M542 Cervicalgia: Secondary | ICD-10-CM | POA: Diagnosis not present

## 2013-08-28 DIAGNOSIS — Y838 Other surgical procedures as the cause of abnormal reaction of the patient, or of later complication, without mention of misadventure at the time of the procedure: Secondary | ICD-10-CM | POA: Diagnosis not present

## 2013-08-28 DIAGNOSIS — L24A9 Irritant contact dermatitis due friction or contact with other specified body fluids: Secondary | ICD-10-CM

## 2013-08-28 DIAGNOSIS — T8140XA Infection following a procedure, unspecified, initial encounter: Secondary | ICD-10-CM | POA: Diagnosis not present

## 2013-08-28 DIAGNOSIS — F3289 Other specified depressive episodes: Secondary | ICD-10-CM | POA: Diagnosis not present

## 2013-08-28 DIAGNOSIS — J45909 Unspecified asthma, uncomplicated: Secondary | ICD-10-CM | POA: Insufficient documentation

## 2013-08-28 DIAGNOSIS — Z888 Allergy status to other drugs, medicaments and biological substances status: Secondary | ICD-10-CM | POA: Insufficient documentation

## 2013-08-28 DIAGNOSIS — Z8041 Family history of malignant neoplasm of ovary: Secondary | ICD-10-CM | POA: Diagnosis not present

## 2013-08-28 DIAGNOSIS — E785 Hyperlipidemia, unspecified: Secondary | ICD-10-CM | POA: Diagnosis not present

## 2013-08-28 DIAGNOSIS — I771 Stricture of artery: Secondary | ICD-10-CM | POA: Diagnosis not present

## 2013-08-28 DIAGNOSIS — I70219 Atherosclerosis of native arteries of extremities with intermittent claudication, unspecified extremity: Secondary | ICD-10-CM | POA: Diagnosis not present

## 2013-08-28 DIAGNOSIS — F17200 Nicotine dependence, unspecified, uncomplicated: Secondary | ICD-10-CM | POA: Insufficient documentation

## 2013-08-28 DIAGNOSIS — I6529 Occlusion and stenosis of unspecified carotid artery: Secondary | ICD-10-CM

## 2013-08-28 DIAGNOSIS — IMO0001 Reserved for inherently not codable concepts without codable children: Secondary | ICD-10-CM | POA: Diagnosis not present

## 2013-08-28 DIAGNOSIS — IMO0002 Reserved for concepts with insufficient information to code with codable children: Secondary | ICD-10-CM | POA: Diagnosis not present

## 2013-08-28 LAB — CBC
HCT: 39.2 % (ref 36.0–46.0)
Hemoglobin: 13.8 g/dL (ref 12.0–15.0)
MCH: 34.1 pg — ABNORMAL HIGH (ref 26.0–34.0)
MCHC: 35.2 g/dL (ref 30.0–36.0)
MCV: 96.8 fL (ref 78.0–100.0)
Platelets: 234 10*3/uL (ref 150–400)
RBC: 4.05 MIL/uL (ref 3.87–5.11)
RDW: 13.4 % (ref 11.5–15.5)
WBC: 18.7 10*3/uL — ABNORMAL HIGH (ref 4.0–10.5)

## 2013-08-28 LAB — BASIC METABOLIC PANEL
Anion gap: 14 (ref 5–15)
BUN: 13 mg/dL (ref 6–23)
CO2: 26 mEq/L (ref 19–32)
Calcium: 9.1 mg/dL (ref 8.4–10.5)
Chloride: 101 mEq/L (ref 96–112)
Creatinine, Ser: 0.78 mg/dL (ref 0.50–1.10)
GFR calc Af Amer: >90 mL/min (ref >90)
GFR calc non Af Amer: 88 mL/min — ABNORMAL LOW (ref >90)
Glucose, Bld: 92 mg/dL (ref 70–99)
Potassium: 3.6 mEq/L — ABNORMAL LOW (ref 3.7–5.3)
Sodium: 141 mEq/L (ref 137–147)

## 2013-08-28 MED ORDER — SODIUM CHLORIDE 0.9 % IV SOLN
INTRAVENOUS | Status: DC
  Administered 2013-08-28: 1000 mL via INTRAVENOUS

## 2013-08-28 MED ORDER — GUAIFENESIN-DM 100-10 MG/5ML PO SYRP: 15.0000 mL | ORAL_SOLUTION | ORAL | Status: DC | PRN

## 2013-08-28 MED ORDER — METOPROLOL TARTRATE 1 MG/ML IV SOLN: 2.0000 mg | INTRAVENOUS | Status: DC | PRN

## 2013-08-28 MED ORDER — PIPERACILLIN-TAZOBACTAM 3.375 G IVPB
3.3750 g | Freq: Three times a day (TID) | INTRAVENOUS | Status: DC
  Administered 2013-08-28 – 2013-08-29 (×3): 3.375 g via INTRAVENOUS
  Filled 2013-08-28 (×6): qty 50

## 2013-08-28 MED ORDER — ACETAMINOPHEN 325 MG PO TABS
650.0000 mg | ORAL_TABLET | Freq: Once | ORAL | Status: AC
  Administered 2013-08-28: 650 mg via ORAL
  Filled 2013-08-28: qty 2

## 2013-08-28 MED ORDER — ALUM & MAG HYDROXIDE-SIMETH 200-200-20 MG/5ML PO SUSP: 15.0000 mL | ORAL | Status: DC | PRN

## 2013-08-28 MED ORDER — ENOXAPARIN SODIUM 40 MG/0.4ML ~~LOC~~ SOLN
40.0000 mg | SUBCUTANEOUS | Status: DC
  Administered 2013-08-29: 40 mg via SUBCUTANEOUS
  Filled 2013-08-28 (×2): qty 0.4

## 2013-08-28 MED ORDER — LABETALOL HCL 5 MG/ML IV SOLN
10.0000 mg | INTRAVENOUS | Status: DC | PRN
  Filled 2013-08-28: qty 4

## 2013-08-28 MED ORDER — POTASSIUM CHLORIDE CRYS ER 20 MEQ PO TBCR
20.0000 meq | EXTENDED_RELEASE_TABLET | Freq: Once | ORAL | Status: AC
  Administered 2013-08-29: 20 meq via ORAL
  Filled 2013-08-28: qty 1

## 2013-08-28 MED ORDER — HYDRALAZINE HCL 20 MG/ML IJ SOLN: 10.0000 mg | INTRAMUSCULAR | Status: DC | PRN

## 2013-08-28 MED ORDER — PANTOPRAZOLE SODIUM 40 MG PO TBEC
40.0000 mg | DELAYED_RELEASE_TABLET | Freq: Every day | ORAL | Status: DC
Start: 2013-08-28 — End: 2013-08-29
  Administered 2013-08-29 (×2): 40 mg via ORAL
  Filled 2013-08-28 (×2): qty 1

## 2013-08-28 MED ORDER — VANCOMYCIN HCL 10 G IV SOLR
1250.0000 mg | Freq: Once | INTRAVENOUS | Status: AC
  Administered 2013-08-29: 1250 mg via INTRAVENOUS
  Filled 2013-08-28: qty 1250

## 2013-08-28 MED ORDER — PHENOL 1.4 % MT LIQD: 1.0000 | OROMUCOSAL | Status: DC | PRN

## 2013-08-28 MED ORDER — ONDANSETRON HCL 4 MG/2ML IJ SOLN: 4.0000 mg | Freq: Four times a day (QID) | INTRAMUSCULAR | Status: DC | PRN

## 2013-08-28 MED ORDER — CLINDAMYCIN PHOSPHATE 600 MG/50ML IV SOLN
600.0000 mg | Freq: Once | INTRAVENOUS | Status: AC
  Administered 2013-08-28: 600 mg via INTRAVENOUS
  Filled 2013-08-28: qty 50

## 2013-08-28 NOTE — ED Provider Notes (Signed)
Medical screening examination/treatment/procedure(s) were performed by non-physician practitioner and as supervising physician I was immediately available for consultation/collaboration.   EKG Interpretation None        Malvin Johns, MD 08/28/13 2013

## 2013-08-28 NOTE — Progress Notes (Signed)
ANTIBIOTIC CONSULT NOTE - INITIAL  Pharmacy Consult for vancomycin Indication: cellulitis  Allergies  Allergen Reactions  . Hydrocodone Nausea And Vomiting and Other (See Comments)    Headache and dizziness  . Other Shortness Of Breath    Cologne  . Oxycodone Nausea And Vomiting and Other (See Comments)    Dizziness   . Percocet [Oxycodone-Acetaminophen] Nausea And Vomiting  . Codeine Nausea Only    dizziness    Patient Measurements: Height: 5\' 4"  (162.6 cm) Weight: 150 lb (68.04 kg) IBW/kg (Calculated) : 54.7 Body Weight: 68 kg  Vital Signs: Temp: 97.5 F (36.4 C) (08/22 2006) Temp src: Oral (08/22 2006) BP: 130/57 mmHg (08/22 1955) Pulse Rate: 76 (08/22 2006) Intake/Output from previous day:   Intake/Output from this shift:    Labs:  Recent Labs  08/28/13 1500  WBC 18.7*  HGB 13.8  PLT 234  CREATININE 0.78   Estimated Creatinine Clearance: 69.9 ml/min (by C-G formula based on Cr of 0.78). No results found for this basename: VANCOTROUGH, VANCOPEAK, VANCORANDOM, GENTTROUGH, GENTPEAK, GENTRANDOM, TOBRATROUGH, TOBRAPEAK, TOBRARND, AMIKACINPEAK, AMIKACINTROU, AMIKACIN,  in the last 72 hours   Microbiology: No results found for this or any previous visit (from the past 720 hour(s)).  Medical History: Past Medical History  Diagnosis Date  . Asthma   . Hyperlipidemia   . History of lead poisoning 1977  . Shortness of breath   . Peripheral vascular disease     claudication  right greater than left  femoral artery  . Anxiety   . Blood transfusion   . Migraine   . Subclavian artery disease     left  . Small bowel obstruction, partial 04/08/11  . Angina     March 2013  . Depression   . Uterine cancer     Pre cervical  . Fibromyalgia   . Anemia     Medications:  Prescriptions prior to admission  Medication Sig Dispense Refill  . acetaminophen (TYLENOL) 325 MG tablet Take 650 mg by mouth every 6 (six) hours as needed for pain.      Marland Kitchen aspirin 325 MG  tablet Take 325 mg by mouth daily.      . clopidogrel (PLAVIX) 75 MG tablet Take 75 mg by mouth daily.      Marland Kitchen ibuprofen (ADVIL,MOTRIN) 200 MG tablet Take 400-600 mg by mouth every 6 (six) hours as needed for pain.       . polyethylene glycol (MIRALAX / GLYCOLAX) packet Take 17 g by mouth daily as needed (constipation).       Assessment: 63 yo lady to start vancomycin for wound infection s/p R CEA. Her CrCl ~ 70 ml/min.  She is also receiving zosyn.  Goal of Therapy:  Vancomycin trough level 10-15 mcg/ml  Plan:  Vancomycin 1250 mg IV X 1 then 750 mg IV q12 hours Cont zosyn as ordered F/u renal function, cultures and clinical course  Thanks for allowing pharmacy to be a part of this patient's care.  Excell Seltzer, PharmD Clinical Pharmacist, [phone] 08/28/2013,9:24 PM

## 2013-08-28 NOTE — ED Notes (Signed)
Attempt report 

## 2013-08-28 NOTE — ED Notes (Signed)
She is 2 weeks post carotid endarterectomy and states this week shes had increased pain and swelling in her neck. staTES IT IS hard to swallow. Denies difficulty breathing. She called dr Trula Slade office and he instructed her to come to ed

## 2013-08-28 NOTE — ED Provider Notes (Signed)
CSN: [id number]     Arrival date & time 08/28/13  1225 History   First MD Initiated Contact with Patient 08/28/13 1442     Chief Complaint  Patient presents with  . Neck Pain     (Consider location/radiation/quality/duration/timing/severity/associated sxs/prior Treatment) HPI Ms. Glantz is a 63 year old female with past medical history of PVD, status post endarterectomy to right carotid and subclavian arteries 2 weeks ago who presents to the ER with pain and swelling and redness to her surgical site. Patient states this was gradual in onset and began last night. She states she has tried using Tylenol for pain with minimal relief and this morning the pain became acutely worse. She states there is no alleviating or aggravating factors. She reports associated swelling, throat pain, dysphagia.  She denies any associated fevers, chills, numbness, tingling. Ms. Hargrove states she spoke with Dr.Brabham with vascular surgery this morning who requested she get to the ER for evaluation and that he would like to see her there. Past Medical History  Diagnosis Date  . Asthma   . Hyperlipidemia   . History of lead poisoning 1977  . Shortness of breath   . Peripheral vascular disease     claudication  right greater than left  femoral artery  . Anxiety   . Blood transfusion   . Migraine   . Subclavian artery disease     left  . Small bowel obstruction, partial 04/08/11  . Angina     March 2013  . Depression   . Uterine cancer     Pre cervical  . Fibromyalgia   . Anemia    Past Surgical History  Procedure Laterality Date  . Abdominal angiogram    . Aorta - bilateral femoral artery bypass graft  02/26/2011    Procedure: AORTA BIFEMORAL BYPASS GRAFT;  Surgeon: Theotis Burrow, MD;  Location: [place] OR;  Service: Vascular;  Laterality: N/A;  . Pr vein bypass graft,aorto-fem-pop  02/26/11  . Laparotomy  04/10/2011    Procedure: EXPLORATORY LAPAROTOMY;  Surgeon: Shann Medal, MD;  Location: [place];   Service: General;  Laterality: N/A;  ENTEROLYSIS OF ADHESIONS  . Endarterectomy  06/13/2011    Procedure: ENDARTERECTOMY CAROTID;  Surgeon: Serafina Mitchell, MD;  Location: [facility] OR;  Service: Vascular;  Laterality: Left;  Left carotid artery endarterectomy with vascu-guard patch angioplasty  . Lower extremity angiogram Left 06/24/12    Left stent intervention, left subclavian  . Cholecystectomy N/A 12/08/2012    Procedure: LAPAROSCOPIC CHOLECYSTECTOMY WITH INTRAOPERATIVE CHOLANGIOGRAM;  Surgeon: Haywood Lasso, MD;  Location: [place];  Service: General;  Laterality: N/A;  . Abdominal hysterectomy  2008  . Endarterectomy Right 08/13/2013    Procedure: RIGHT  CAROTID ENDARTERECTOMY CAROTID WITH BOVINE PERICARDIAL PATCH ANGIOPLASTY;  Surgeon: Serafina Mitchell, MD;  Location: [place] OR;  Service: Vascular;  Laterality: Right;  . Carotid endarterectomy Left 06/13/2011    Left  CEA  . Carotid endarterectomy Right 08-13-13    CEA   Family History  Problem Relation Age of Onset  . Cancer Mother     breast, colon, ovarian  . Anesthesia problems Neg Hx   . Cancer Brother 50    lung  . Hyperlipidemia Brother   . Cancer Other   . Stroke Other   . Cancer Maternal Grandmother     ovarian   History  Substance Use Topics  . Smoking status: Current Some Day Smoker -- 0.05 packs/day for 45 years    Types: Cigarettes  .  Smokeless tobacco: Never Used     Comment: pt states that she smokes about 6 cigs per day  . Alcohol Use: No   OB History   Grav Para Term Preterm Abortions TAB SAB Ect Mult Living                 Review of Systems  Constitutional: Negative for fever and chills.  HENT: Positive for facial swelling, sore throat and trouble swallowing. Negative for voice change.   Eyes: Negative for visual disturbance.  Respiratory: Negative for shortness of breath and stridor.   Cardiovascular: Negative for chest pain.  Gastrointestinal: Negative for nausea, vomiting and abdominal pain.  Genitourinary:  Negative for dysuria.  Musculoskeletal: Positive for neck pain.  Skin: Negative for rash.  Neurological: Negative for dizziness, weakness and numbness.  Psychiatric/Behavioral: Negative.       Allergies  Hydrocodone; Other; Oxycodone; Percocet; and Codeine  Home Medications   Prior to Admission medications   Medication Sig Start Date End Date Taking? Authorizing Provider  acetaminophen (TYLENOL) 325 MG tablet Take 650 mg by mouth every 6 (six) hours as needed for pain.   Yes Historical Provider, MD  aspirin 325 MG tablet Take 325 mg by mouth daily.   Yes Historical Provider, MD  clopidogrel (PLAVIX) 75 MG tablet Take 75 mg by mouth daily.   Yes Historical Provider, MD  ibuprofen (ADVIL,MOTRIN) 200 MG tablet Take 400-600 mg by mouth every 6 (six) hours as needed for pain.    Yes Historical Provider, MD  polyethylene glycol (MIRALAX / GLYCOLAX) packet Take 17 g by mouth daily as needed (constipation).   Yes Historical Provider, MD   BP 111/55  Pulse 74  Temp(Src) 97.9 F (36.6 C) (Oral)  Resp 14  Ht 5\' 4"  (1.626 m)  Wt 150 lb (68.04 kg)  BMI 25.73 kg/m2  SpO2 96% Physical Exam  Constitutional: She is oriented to person, place, and time. She appears well-developed and well-nourished. No distress.  HENT:  Head: Normocephalic and atraumatic.    Mouth/Throat: Uvula is midline and oropharynx is clear and moist. No trismus in the jaw. No uvula swelling. No oropharyngeal exudate, posterior oropharyngeal edema, posterior oropharyngeal erythema or tonsillar abscesses.  Eyes: Right eye exhibits no discharge. Left eye exhibits no discharge. No scleral icterus.  Neck: Normal carotid pulses present. Carotid bruit is not present. No rigidity. Erythema and decreased range of motion present. No edema present.  Cardiovascular: Normal rate, regular rhythm, S1 normal, S2 normal and normal heart sounds.   No murmur heard. Pulmonary/Chest: Effort normal and breath sounds normal. No accessory  muscle usage or stridor. Not tachypneic. No respiratory distress.  Abdominal: Soft. Normal appearance and bowel sounds are normal. There is no tenderness.  Musculoskeletal: She exhibits no edema and no tenderness.  Neurological: She is alert and oriented to person, place, and time. She has normal strength. No cranial nerve deficit or sensory deficit. Coordination normal.  Skin: Skin is warm and dry. No rash noted. She is not diaphoretic.  Psychiatric: She has a normal mood and affect.    ED Course  Procedures (including critical care time) Labs Review Labs Reviewed  CBC - Abnormal; Notable for the following:    WBC 18.7 (*)    MCH 34.1 (*)    All other components within normal limits  BASIC METABOLIC PANEL - Abnormal; Notable for the following:    Potassium 3.6 (*)    GFR calc non Af Amer 88 (*)    All other  components within normal limits    Imaging Review No results found.   EKG Interpretation None      MDM   Final diagnoses:  None    63 year old female 2 weeks status post endarterectomy for right carotid and right subclavian arteries presenting with pain, redness, swelling to surgical site since early this morning. Initial workup to include CBC, BMP, consult to vascular. Pain controlled with Tylenol. Patient states she only would like to take Tylenol for pain.   CBC returned with leukocytosis of 18.7  We spoke with Dr. Trula Slade, patient's vascular surgeon who recommended we ordered a carotid Doppler on patient's right neck and place patient on IV antibiotics. Doppler was ordered and we placed patient on 600 mg of clindamycin IV.    4:45 PM: Dr. Trula Slade saw patient in ED and requests to be contacted once patient returns from carotid Doppler.  Pt signed out to [employer], [place].  Plan is to f/u with Dr. Trula Slade once pt returns from carotid doppler.     Signed,  Dahlia Bailiff, PA-C 5:15 PM   Carrie Mew, PA-C 08/28/13 [phone]

## 2013-08-28 NOTE — Progress Notes (Signed)
VASCULAR LAB PRELIMINARY  PRELIMINARY  PRELIMINARY  PRELIMINARY  Carotid duplex completed.    Preliminary report:  Right  -  Technically difficult due to recent surgery and pain. 1% to 39% ICA stenosis lower end of range. Vertebral artery flow is  antegrade.                                  Left  -  40% to 59% ICA stenosis. Vertebral artery flow is antegrade.  Sona Nations, RVS 08/28/2013, 5:30 PM

## 2013-08-28 NOTE — H&P (Signed)
History and Physical  Patient name: Kimberly [name] MRN: [id number] DOB: [birth year] Sex: female   Reason for Admission:  Chief Complaint  Patient presents with  . Neck Pain    HISTORY OF PRESENT ILLNESS: The patient is well known to me, having undergone aortobifemoral bypass graft, left carotid endarterectomy, and most recently on 08/13/2013, right carotid endarterectomy.  Her operation was uncomplicated and she was discharged on the following day.  She called me earlier today stating that she overnight began to develop swelling and redness at her carotid incision.  This was associated with trouble swallowing and pain.  I told her to come to the emergency department.  Past Medical History  Diagnosis Date  . Asthma   . Hyperlipidemia   . History of lead poisoning 1977  . Shortness of breath   . Peripheral vascular disease     claudication  right greater than left  femoral artery  . Anxiety   . Blood transfusion   . Migraine   . Subclavian artery disease     left  . Small bowel obstruction, partial 04/08/11  . Angina     March 2013  . Depression   . Uterine cancer     Pre cervical  . Fibromyalgia   . Anemia     Past Surgical History  Procedure Laterality Date  . Abdominal angiogram    . Aorta - bilateral femoral artery bypass graft  02/26/2011    Procedure: AORTA BIFEMORAL BYPASS GRAFT;  Surgeon: Theotis Burrow, MD;  Location: [place] OR;  Service: Vascular;  Laterality: N/A;  . Pr vein bypass graft,aorto-fem-pop  02/26/11  . Laparotomy  04/10/2011    Procedure: EXPLORATORY LAPAROTOMY;  Surgeon: Shann Medal, MD;  Location: [place];  Service: General;  Laterality: N/A;  ENTEROLYSIS OF ADHESIONS  . Endarterectomy  06/13/2011    Procedure: ENDARTERECTOMY CAROTID;  Surgeon: Serafina Mitchell, MD;  Location: [facility] OR;  Service: Vascular;  Laterality: Left;  Left carotid artery endarterectomy with vascu-guard patch angioplasty  . Lower extremity angiogram Left 06/24/12    Left stent  intervention, left subclavian  . Cholecystectomy N/A 12/08/2012    Procedure: LAPAROSCOPIC CHOLECYSTECTOMY WITH INTRAOPERATIVE CHOLANGIOGRAM;  Surgeon: Haywood Lasso, MD;  Location: [place];  Service: General;  Laterality: N/A;  . Abdominal hysterectomy  2008  . Endarterectomy Right 08/13/2013    Procedure: RIGHT  CAROTID ENDARTERECTOMY CAROTID WITH BOVINE PERICARDIAL PATCH ANGIOPLASTY;  Surgeon: Serafina Mitchell, MD;  Location: [place] OR;  Service: Vascular;  Laterality: Right;  . Carotid endarterectomy Left 06/13/2011    Left  CEA  . Carotid endarterectomy Right 08-13-13    CEA    History   Social History  . Marital Status: Legally Separated    Spouse Name: N/A    Number of Children: N/A  . Years of Education: N/A   Occupational History  . Not on file.   Social History Main Topics  . Smoking status: Current Some Day Smoker -- 0.05 packs/day for 45 years    Types: Cigarettes  . Smokeless tobacco: Never Used     Comment: pt states that she smokes about 6 cigs per day  . Alcohol Use: No  . Drug Use: No     Comment: History of Cocaine and marijuana abuse: "not in many years" (04/08/11  . Sexual Activity: Not Currently   Other Topics Concern  . Not on file   Social History Narrative   Pt  lives at home with her 2 grandsons. She has been separated for 75yrs, has two children, and has a 12th grade education level. She drinks 3 cups of coffee daily.    Family History  Problem Relation Age of Onset  . Cancer Mother     breast, colon, ovarian  . Anesthesia problems Neg Hx   . Cancer Brother 50    lung  . Hyperlipidemia Brother   . Cancer Other   . Stroke Other   . Cancer Maternal Grandmother     ovarian    Allergies as of 08/28/2013 - Review Complete 08/28/2013  Allergen Reaction Noted  . Hydrocodone Nausea And Vomiting and Other (See Comments) 01/11/2013  . Other Shortness Of Breath 06/11/2011  . Oxycodone Nausea And Vomiting and Other (See Comments) 01/11/2013  . Percocet  [oxycodone-acetaminophen] Nausea And Vomiting 01/11/2013  . Codeine Nausea Only 05/02/2011    No current facility-administered medications on file prior to encounter.   Current Outpatient Prescriptions on File Prior to Encounter  Medication Sig Dispense Refill  . acetaminophen (TYLENOL) 325 MG tablet Take 650 mg by mouth every 6 (six) hours as needed for pain.      Marland Kitchen aspirin 325 MG tablet Take 325 mg by mouth daily.      . clopidogrel (PLAVIX) 75 MG tablet Take 75 mg by mouth daily.      Marland Kitchen ibuprofen (ADVIL,MOTRIN) 200 MG tablet Take 400-600 mg by mouth every 6 (six) hours as needed for pain.       . polyethylene glycol (MIRALAX / GLYCOLAX) packet Take 17 g by mouth daily as needed (constipation).         REVIEW OF SYSTEMS: Please see history of present illness  PHYSICAL EXAMINATION: General: The patient appears their stated age.  Vital signs are BP 130/57  Pulse 76  Temp(Src) 97.5 F (36.4 C) (Oral)  Resp 20  Ht 5\' 4"  (1.626 m)  Wt 150 lb (68.04 kg)  BMI 25.73 kg/m2  SpO2 94% HEENT:  No gross abnormalities Pulmonary: Respirations are non-labored Abdomen: Soft and non-tender with. Musculoskeletal: There are no major deformities.   Neurologic: No focal weakness or paresthesias are detected, Skin: The right neck incision is intact, however there is a fair amount of surrounding erythema.  This area is tender to the touch.  There is minimal clear drainage. Psychiatric: The patient has normal affect. Cardiovascular: There is a regular rate and rhythm without significant murmur appreciated.  Diagnostic Studies: Carotid duplex shows that the endarterectomy is widely patent.   Assessment:  Wound infection, status post right carotid endarterectomy Plan: The patient will be admitted for IV antibiotics and pain control.     Eldridge Abrahams, M.D. [facility] Office: [phone] Pager:  [phone]

## 2013-08-28 NOTE — ED Provider Notes (Signed)
4:44 PM Pt signed out by Brent General, PA-C at shift change.Pt is a 63yo female c/o right sided neck pain 2 weeks s/p endarterectomy to right carotid and subclavian arteries.  WBC: elevated at  18.7, pt started on IV clindamycin.   Plan is to f/u on carotid u/s, then call vascular surgery about results of u/s to determine pt's disposition.    VASCULAR LAB  PRELIMINARY PRELIMINARY PRELIMINARY PRELIMINARY  Carotid duplex completed.  Preliminary report: Right - Technically difficult due to recent surgery and pain. 1% to 39% ICA stenosis lower end of range. Vertebral artery flow is antegrade.  Left - 40% to 59% ICA stenosis. Vertebral artery flow is antegrade.   SLAUGHTER, [place], RVS  08/28/2013, 5:30 PM  06:16PM Consulted with vascular surgery, Dr. Trula Slade, who stated pt my be admitted for IV antibiotics if she was willing to stay.  Pt agreed to be admitted for the night for additional IV antibiotics.  Pt is stable at this time. Appears sleepy but asking for a dinner tray.    Noland Fordyce, PA-C 08/28/13 1829

## 2013-08-29 LAB — URINALYSIS, ROUTINE W REFLEX MICROSCOPIC
Bilirubin Urine: NEGATIVE
Glucose, UA: NEGATIVE mg/dL
Ketones, ur: NEGATIVE mg/dL
Leukocytes, UA: NEGATIVE
Nitrite: POSITIVE — AB
Protein, ur: NEGATIVE mg/dL
Specific Gravity, Urine: 1.02 (ref 1.005–1.030)
Urobilinogen, UA: 1 mg/dL (ref 0.0–1.0)
pH: 6.5 (ref 5.0–8.0)

## 2013-08-29 LAB — URINE MICROSCOPIC-ADD ON

## 2013-08-29 MED ORDER — VANCOMYCIN HCL IN DEXTROSE 750-5 MG/150ML-% IV SOLN
750.0000 mg | Freq: Two times a day (BID) | INTRAVENOUS | Status: DC
  Administered 2013-08-29: 750 mg via INTRAVENOUS
  Filled 2013-08-29 (×2): qty 150

## 2013-08-29 NOTE — Progress Notes (Signed)
UR completed 

## 2013-08-29 NOTE — ED Provider Notes (Signed)
Medical screening examination/treatment/procedure(s) were performed by non-physician practitioner and as supervising physician I was immediately available for consultation/collaboration.   EKG Interpretation None       Threasa Beards, MD 08/29/13 [phone]

## 2013-08-29 NOTE — Progress Notes (Signed)
    Subjective  -   Reports that she is a little better today.  She still has neck pain and headaches.   Physical Exam:  I removed the Dermabond from the incision.  There is a small opening at the base of the incision.  The erythema has resolved.  No significant drainage.       Assessment/Plan:    The patient's erythema has improved overnight with IV antibiotics.  I had recommended stay 1 more day for IV antibiotics, however the patient has to get home today to get her kids to school.  I will keep her to get another dose of vancomycin at noon and Zosyn at 2 PM.  After that she'll be discharged home with 14 days of Levaquin.  She will see me in the office in one week.  Hernan Turnage IV, V. WELLS 08/29/2013 9:43 AM --  Filed Vitals:   08/29/13 0632  BP: 122/57  Pulse: 69  Temp: 97.7 F (36.5 C)  Resp: 18    Intake/Output Summary (Last 24 hours) at 08/29/13 0943 Last data filed at 08/29/13 0200  Gross per 24 hour  Intake      0 ml  Output    350 ml  Net   -350 ml     Laboratory CBC    Component Value Date/Time   WBC 18.7* 08/28/2013 1500   HGB 13.8 08/28/2013 1500   HCT 39.2 08/28/2013 1500   PLT 234 08/28/2013 1500    BMET    Component Value Date/Time   NA 141 08/28/2013 1500   K 3.6* 08/28/2013 1500   CL 101 08/28/2013 1500   CO2 26 08/28/2013 1500   GLUCOSE 92 08/28/2013 1500   BUN 13 08/28/2013 1500   CREATININE 0.78 08/28/2013 1500   CALCIUM 9.1 08/28/2013 1500   GFRNONAA 88* 08/28/2013 1500   GFRAA >90 08/28/2013 1500    COAG Lab Results  Component Value Date   INR 1.03 08/12/2013   INR 0.98 06/11/2011   INR 0.99 04/09/2011   No results found for this basename: PTT    Antibiotics Anti-infectives   Start     Dose/Rate Route Frequency Ordered Stop   08/29/13 1200  vancomycin (VANCOCIN) IVPB 750 mg/150 ml premix     750 mg 150 mL/hr over 60 Minutes Intravenous Every 12 hours 08/29/13 0724     08/28/13 2200  piperacillin-tazobactam (ZOSYN) IVPB 3.375 g     3.375 g 12.5 mL/hr over 240 Minutes Intravenous 3 times per day 08/28/13 2114     08/28/13 2130  vancomycin (VANCOCIN) 1,250 mg in sodium chloride 0.9 % 250 mL IVPB     1,250 mg 166.7 mL/hr over 90 Minutes Intravenous  Once 08/28/13 2124 08/29/13 0217   08/28/13 1615  clindamycin (CLEOCIN) IVPB 600 mg     600 mg 100 mL/hr over 30 Minutes Intravenous  Once 08/28/13 1610 08/28/13 1755       V. Leia Alf, M.D. [facility] Office: [phone] Pager:  [phone]

## 2013-08-30 ENCOUNTER — Telehealth: Payer: Self-pay | Admitting: Surgery

## 2013-08-30 NOTE — Telephone Encounter (Addendum)
Message copied by Doristine Section on Mon Aug 30, 2013  2:19 PM ------      Message from: Murdock, [place] K      Created: Mon Aug 30, 2013  9:27 AM      Regarding: Schedule                   ----- Message -----         From: Serafina Mitchell, MD         Sent: 08/29/2013   8:39 AM           To: [facility] Charge Pool            Schedule for follow up in 1-2 weeks.  Admitted for wound infection after carotid  notified patient of appt. on 09-06-13 with dr. Trula Slade ------

## 2013-09-02 NOTE — Discharge Summary (Signed)
Physician Discharge Summary  Patient ID: Kimberly [name] MRN: [id number] DOB/AGE: 01-29-1950 63 y.o.  Admit date: 08/28/2013 Discharge date: 09/02/2013  Admission Diagnosis: Wound infection, right carotid endarterectomy  Discharge Diagnoses:  Same  Secondary Diagnoses: Active Problems:   Neck pain   Wound infection after surgery   Procedures: None   Discharged Condition: fair  Hospital Course: The patient was admitted with cellulitis to her right carotid incision.  She was placed on IV antibiotics.  The infection appeared to improve.  The patient wanted to go home to get her kids to school tomorrow.  She will be discharged on oral antibiotics with close followup.  Consults:     Significant Diagnostic Studies: CBC    Component Value Date/Time   WBC 18.7* 08/28/2013 1500   RBC 4.05 08/28/2013 1500   HGB 13.8 08/28/2013 1500   HCT 39.2 08/28/2013 1500   PLT 234 08/28/2013 1500   MCV 96.8 08/28/2013 1500   MCH 34.1* 08/28/2013 1500   MCHC 35.2 08/28/2013 1500   RDW 13.4 08/28/2013 1500   LYMPHSABS 1.1 04/12/2011 0336   MONOABS 0.6 04/12/2011 0336   EOSABS 0.2 04/12/2011 0336   BASOSABS 0.0 04/12/2011 0336    BMET    Component Value Date/Time   NA 141 08/28/2013 1500   K 3.6* 08/28/2013 1500   CL 101 08/28/2013 1500   CO2 26 08/28/2013 1500   GLUCOSE 92 08/28/2013 1500   BUN 13 08/28/2013 1500   CREATININE 0.78 08/28/2013 1500   CALCIUM 9.1 08/28/2013 1500   GFRNONAA 88* 08/28/2013 1500   GFRAA >90 08/28/2013 1500    COAG Lab Results  Component Value Date   INR 1.03 08/12/2013   INR 0.98 06/11/2011   INR 0.99 04/09/2011   No results found for this basename: PTT    Disposition: 01-Home or Self Care  Discharge Instructions   Call MD for:  redness, tenderness, or signs of infection (pain, swelling, bleeding, redness, odor or green/yellow discharge around incision site)    Complete by:  As directed      Call MD for:  severe or increased pain, loss or decreased feeling  in affected  limb(s)    Complete by:  As directed      Call MD for:  temperature >100.5    Complete by:  As directed      Discharge instructions    Complete by:  As directed   Follow up in 1 week.  Office to call     Resume previous diet    Complete by:  As directed             Medication List         acetaminophen 325 MG tablet  Commonly known as:  TYLENOL  Take 650 mg by mouth every 6 (six) hours as needed for pain.     aspirin 325 MG tablet  Take 325 mg by mouth daily.     clopidogrel 75 MG tablet  Commonly known as:  PLAVIX  Take 75 mg by mouth daily.     ibuprofen 200 MG tablet  Commonly known as:  ADVIL,MOTRIN  Take 400-600 mg by mouth every 6 (six) hours as needed for pain.     polyethylene glycol packet  Commonly known as:  MIRALAX / GLYCOLAX  Take 17 g by mouth daily as needed (constipation).         SignedEldridge Abrahams 09/02/2013, 9:44 AM

## 2013-09-03 ENCOUNTER — Encounter: Payer: Self-pay | Admitting: Surgery

## 2013-09-06 ENCOUNTER — Encounter: Payer: Self-pay | Admitting: Surgery

## 2013-09-06 ENCOUNTER — Ambulatory Visit (INDEPENDENT_AMBULATORY_CARE_PROVIDER_SITE_OTHER): Payer: Medicare Other | Admitting: Surgery

## 2013-09-06 VITALS — BP 149/70 | HR 66 | Temp 98.2°F | Resp 16 | Ht 64.0 in | Wt 148.0 lb

## 2013-09-06 DIAGNOSIS — Z48812 Encounter for surgical aftercare following surgery on the circulatory system: Secondary | ICD-10-CM

## 2013-09-06 DIAGNOSIS — I6529 Occlusion and stenosis of unspecified carotid artery: Secondary | ICD-10-CM

## 2013-09-06 NOTE — Progress Notes (Signed)
The patient is back today for followup.  On 08/13/2013 she underwent right carotid endarterectomy with bovine pericardial patch angioplasty.  This was done for asymptomatic right carotid stenosis.  Intraoperative findings included 85% stenosis.  The day prior, the patient underwent angioplasty of a left subclavian artery stent stenosis.  Approximately 2 weeks after her operation, the patient was readmitted with erythema around her carotid incision.  She was given IV antibiotics overnight and discharged home the following day.  She is back for followup today.  She states that she is improving but is suffering from some diarrhea and generalized weakness.  On examination her incision has healed nicely.  There is no drainage.  There is no erythema.  The patient remains neurologically intact after her carotid endarterectomy.  Because of the diarrhea, I'm going to stop her antibiotics.  If diarrhea persists for 2-3 days after stopping the antibiotics she is going to touch base with me and I will give her a prescription for Flagyl.  Otherwise, she is scheduled to come back in 6 months with a followup ultrasound of her carotid artery.  Annamarie Major

## 2013-09-06 NOTE — Addendum Note (Signed)
Addended by: Dorthula Rue L on: 09/06/2013 11:07 AM   Modules accepted: Orders

## 2013-12-16 ENCOUNTER — Encounter (HOSPITAL_COMMUNITY): Payer: Self-pay | Admitting: Surgery

## 2014-02-07 ENCOUNTER — Other Ambulatory Visit: Payer: Self-pay

## 2014-02-07 DIAGNOSIS — I6529 Occlusion and stenosis of unspecified carotid artery: Secondary | ICD-10-CM

## 2014-02-07 MED ORDER — CLOPIDOGREL BISULFATE 75 MG PO TABS: 75.0000 mg | ORAL_TABLET | Freq: Every day | ORAL | Status: DC

## 2014-03-11 ENCOUNTER — Encounter: Payer: Self-pay | Admitting: Surgery

## 2014-03-14 ENCOUNTER — Ambulatory Visit (INDEPENDENT_AMBULATORY_CARE_PROVIDER_SITE_OTHER): Payer: Medicare Other | Admitting: Surgery

## 2014-03-14 ENCOUNTER — Other Ambulatory Visit: Payer: Self-pay | Admitting: *Deleted

## 2014-03-14 ENCOUNTER — Ambulatory Visit (HOSPITAL_COMMUNITY)
Admission: RE | Admit: 2014-03-14 | Discharge: 2014-03-14 | Disposition: A | Discharge status: Home / Self Care | Payer: Medicare Other | Source: Ambulatory Visit | Attending: Surgery | Admitting: Surgery

## 2014-03-14 ENCOUNTER — Encounter: Payer: Self-pay | Admitting: Surgery

## 2014-03-14 VITALS — BP 148/62 | HR 77 | Ht 64.0 in

## 2014-03-14 DIAGNOSIS — I6523 Occlusion and stenosis of bilateral carotid arteries: Secondary | ICD-10-CM

## 2014-03-14 DIAGNOSIS — I739 Peripheral vascular disease, unspecified: Secondary | ICD-10-CM

## 2014-03-14 DIAGNOSIS — Z48812 Encounter for surgical aftercare following surgery on the circulatory system: Secondary | ICD-10-CM | POA: Diagnosis not present

## 2014-03-14 NOTE — Progress Notes (Signed)
Patient name: Kimberly [name] MRN: [id number] DOB: [birth year] Sex: female     Chief Complaint  Patient presents with  . Re-evaluation    6 month f/u - carotid, c/o constant HA also has drainage in throat/nubness and pain in Rt jaw    HISTORY OF PRESENT ILLNESS: The patient is back today for follow-up.  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  She went back for lysis of adhesions secondary to abdominal pain in April.  In June 2013 she underwent left carotid endarterectomy for a high-grade asymptomatic stenosis.  She then later presented with dizziness and temporary vision loss.  On 06/24/2012 she had stenting of her left subclavian artery.  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  On 08/13/2013 she underwent right carotid endarterectomy for an asymptomatic right carotid stenosis.  Today, she tells me that she does not feel very good and has not felt well for a while.  She states that she has headaches every day.  She has low energy.  She is concerned about a lump in her right axilla that has been there for 3 months.  Past Medical History  Diagnosis Date  . Asthma   . Hyperlipidemia   . History of lead poisoning 1977  . Shortness of breath   . Peripheral vascular disease     claudication  right greater than left  femoral artery  . Anxiety   . Blood transfusion   . Migraine   . Subclavian artery disease     left  . Small bowel obstruction, partial 04/08/11  . Angina     March 2013  . Depression   . Uterine cancer     Pre cervical  . Fibromyalgia   . Anemia   . Stroke     Past Surgical History  Procedure Laterality Date  . Abdominal angiogram    . Aorta - bilateral femoral artery bypass graft  02/26/2011    Procedure: AORTA BIFEMORAL BYPASS GRAFT;  Surgeon: Theotis Burrow, MD;  Location: [place] OR;  Service: Vascular;  Laterality: N/A;  . Pr vein bypass  graft,aorto-fem-pop  02/26/11  . Laparotomy  04/10/2011    Procedure: EXPLORATORY LAPAROTOMY;  Surgeon: Shann Medal, MD;  Location: [place];  Service: General;  Laterality: N/A;  ENTEROLYSIS OF ADHESIONS  . Endarterectomy  06/13/2011    Procedure: ENDARTERECTOMY CAROTID;  Surgeon: Serafina Mitchell, MD;  Location: [facility] OR;  Service: Vascular;  Laterality: Left;  Left carotid artery endarterectomy with vascu-guard patch angioplasty  . Lower extremity angiogram Left 06/24/12    Left stent intervention, left subclavian  . Cholecystectomy N/A 12/08/2012    Procedure: LAPAROSCOPIC CHOLECYSTECTOMY WITH INTRAOPERATIVE CHOLANGIOGRAM;  Surgeon: Haywood Lasso, MD;  Location: [place];  Service: General;  Laterality: N/A;  . Abdominal hysterectomy  2008  . Endarterectomy Right 08/13/2013    Procedure: RIGHT  CAROTID ENDARTERECTOMY CAROTID WITH BOVINE PERICARDIAL PATCH ANGIOPLASTY;  Surgeon: Serafina Mitchell, MD;  Location: [place] OR;  Service: Vascular;  Laterality: Right;  . Carotid endarterectomy Left 06/13/2011    Left  CEA  . Carotid endarterectomy Right 08-13-13    CEA  . Abdominal angiogram N/A 02/05/2011    Procedure: ABDOMINAL ANGIOGRAM;  Surgeon: Serafina Mitchell, MD;  Location: [facility] CATH LAB;  Service: Cardiovascular;  Laterality: N/A;  . Arch aortogram N/A 06/24/2012    Procedure: ARCH AORTOGRAM;  Surgeon: Serafina Mitchell,  MD;  Location: [place] CATH LAB;  Service: Cardiovascular;  Laterality: N/A;  . Bilateral upper extremity angiogram Left 08/12/2013    Procedure: UPPER EXTREMITY ANGIOGRAM;  Surgeon: Serafina Mitchell, MD;  Location: [facility] CATH LAB;  Service: Cardiovascular;  Laterality: Left;    History   Social History  . Marital Status: Legally Separated    Spouse Name: N/A  . Number of Children: N/A  . Years of Education: N/A   Occupational History  . Not on file.   Social History Main Topics  . Smoking status: Current Some Day Smoker -- 0.05 packs/day for 45 years    Types: Cigarettes  . Smokeless tobacco:  Never Used     Comment: pt states that she smokes about 6 cigs per day  . Alcohol Use: No  . Drug Use: No     Comment: History of Cocaine and marijuana abuse: "not in many years" (04/08/11  . Sexual Activity: Not Currently   Other Topics Concern  . Not on file   Social History Narrative   Pt lives at home with her 2 grandsons. She has been separated for 64yrs, has two children, and has a 12th grade education level. She drinks 3 cups of coffee daily.    Family History  Problem Relation Age of Onset  . Cancer Mother     breast, colon, ovarian  . Anesthesia problems Neg Hx   . Cancer Brother 50    lung  . Hyperlipidemia Brother   . Cancer Other   . Stroke Other   . Cancer Maternal Grandmother     ovarian    Allergies as of 03/14/2014 - Review Complete 03/14/2014  Allergen Reaction Noted  . Hydrocodone Nausea And Vomiting and Other (See Comments) 01/11/2013  . Other Shortness Of Breath 06/11/2011  . Oxycodone Nausea And Vomiting and Other (See Comments) 01/11/2013  . Percocet [oxycodone-acetaminophen] Nausea And Vomiting 01/11/2013  . Codeine Nausea Only 05/02/2011    Current Outpatient Prescriptions on File Prior to Visit  Medication Sig Dispense Refill  . acetaminophen (TYLENOL) 325 MG tablet Take 650 mg by mouth every 6 (six) hours as needed for pain.    Marland Kitchen aspirin 325 MG tablet Take 325 mg by mouth daily.    . clopidogrel (PLAVIX) 75 MG tablet Take 1 tablet (75 mg total) by mouth daily. 90 tablet 2  . ibuprofen (ADVIL,MOTRIN) 200 MG tablet Take 400-600 mg by mouth every 6 (six) hours as needed for pain.     . polyethylene glycol (MIRALAX / GLYCOLAX) packet Take 17 g by mouth daily as needed (constipation).    Marland Kitchen levofloxacin (LEVAQUIN) 500 MG tablet Take 500 mg by mouth daily.     No current facility-administered medications on file prior to visit.     REVIEW OF SYSTEMS: Cardiovascular: No chest pain, chest pressure, palpitations, orthopnea, or dyspnea on exertion. No  claudication or rest pain,   Pulmonary: No productive cough, asthma or wheezing. Neurologic: No weakness, paresthesias, aphasia, or amaurosis. No dizziness.  Positive headaches Hematologic: No bleeding problems or clotting disorders. Musculoskeletal: No joint pain or joint swelling. Gastrointestinal: No blood in stool or hematemesis Genitourinary: No dysuria or hematuria. Psychiatric:: No history of major depression. Integumentary: No rashes or ulcers.  Lump in the right axilla Constitutional: No fever or chills.  PHYSICAL EXAMINATION:   Vital signs are BP 148/62 mmHg  Pulse 77  Ht 5\' 4"  (1.626 m)  SpO2 100% General: The patient appears their stated age. HEENT:  No gross  abnormalities Pulmonary:  Non labored breathing Abdomen: Soft and non-tender Musculoskeletal: There are no major deformities. Neurologic: No focal weakness or paresthesias are detected, Skin: There are no ulcer or rashes noted.  The right axilla is more full than the left and tender. Psychiatric: The patient has normal affect. Cardiovascular: There is a regular rate and rhythm without significant murmur appreciated.  Palpable bilateral radial and bilateral dorsalis pedis pulse   Diagnostic Studies Carotid duplex was performed today.  This reveals a patent right carotid endarterectomy site with hyperplasia at the proximal and distal patch suggesting 40-50 percent stenosis.  The left carotid endarterectomy is widely patent.  Left subclavian stent could not be visualized.  There are elevated velocities and turbid waveforms.  Assessment: #1: Carotid stenosis #2: Subclavian stenosis, left #3: Status post aortobifemoral bypass #4: Right axillary mass Plan: #1: The patient has early hyperplasia on the right.  The left side is widely patent.  She will follow-up in one year with a repeat carotid ultrasound #2: The subclavian stent was not well visualized today, however there was turbulent waveforms.  She does have a  palpable left radial pulse and is not symptomatic.  Therefore I will evaluate this in one year. #3: The patient is doing very well from her aortobifemoral bypass graft with palpable pedal pulses and no claudication.  #4: The patient would like to be evaluated at [facility] for a primary care physician.  She is concerned about the mass in her right axilla.  I will try to arrange this and expedite it.  Eldridge Abrahams, M.D. [facility] Office: [phone] Pager:  [phone]

## 2014-03-15 ENCOUNTER — Other Ambulatory Visit: Payer: Self-pay | Admitting: *Deleted

## 2014-03-15 DIAGNOSIS — N6310 Unspecified lump in the right breast, unspecified quadrant: Secondary | ICD-10-CM

## 2014-03-21 ENCOUNTER — Encounter: Payer: Self-pay | Admitting: Internal Medicine

## 2014-03-21 ENCOUNTER — Other Ambulatory Visit: Payer: Self-pay | Admitting: Internal Medicine

## 2014-03-21 ENCOUNTER — Ambulatory Visit: Payer: Medicare Other | Attending: Internal Medicine | Admitting: Internal Medicine

## 2014-03-21 VITALS — BP 106/72 | HR 75 | Temp 98.2°F | Resp 16 | Ht 63.5 in | Wt 146.0 lb

## 2014-03-21 DIAGNOSIS — M545 Low back pain, unspecified: Secondary | ICD-10-CM

## 2014-03-21 DIAGNOSIS — Z720 Tobacco use: Secondary | ICD-10-CM | POA: Diagnosis not present

## 2014-03-21 DIAGNOSIS — IMO0001 Reserved for inherently not codable concepts without codable children: Secondary | ICD-10-CM

## 2014-03-21 DIAGNOSIS — R229 Localized swelling, mass and lump, unspecified: Secondary | ICD-10-CM | POA: Insufficient documentation

## 2014-03-21 DIAGNOSIS — R1011 Right upper quadrant pain: Secondary | ICD-10-CM | POA: Diagnosis not present

## 2014-03-21 DIAGNOSIS — I6523 Occlusion and stenosis of bilateral carotid arteries: Secondary | ICD-10-CM

## 2014-03-21 DIAGNOSIS — R2231 Localized swelling, mass and lump, right upper limb: Secondary | ICD-10-CM | POA: Diagnosis not present

## 2014-03-21 DIAGNOSIS — R519 Headache, unspecified: Secondary | ICD-10-CM

## 2014-03-21 DIAGNOSIS — R51 Headache: Secondary | ICD-10-CM | POA: Diagnosis not present

## 2014-03-21 DIAGNOSIS — N63 Unspecified lump in unspecified breast: Secondary | ICD-10-CM

## 2014-03-21 DIAGNOSIS — F17200 Nicotine dependence, unspecified, uncomplicated: Secondary | ICD-10-CM

## 2014-03-21 LAB — COMPLETE METABOLIC PANEL WITH GFR
ALT: 9 U/L (ref 0–35)
AST: 15 U/L (ref 0–37)
Albumin: 4.1 g/dL (ref 3.5–5.2)
Alkaline Phosphatase: 88 U/L (ref 39–117)
BUN: 13 mg/dL (ref 6–23)
CO2: 26 mEq/L (ref 19–32)
Calcium: 9.3 mg/dL (ref 8.4–10.5)
Chloride: 102 mEq/L (ref 96–112)
Creat: 0.86 mg/dL (ref 0.50–1.10)
GFR, Est African American: 84 mL/min
GFR, Est Non African American: 73 mL/min
Glucose, Bld: 66 mg/dL — ABNORMAL LOW (ref 70–99)
Potassium: 4.9 mEq/L (ref 3.5–5.3)
Sodium: 141 mEq/L (ref 135–145)
Total Bilirubin: 0.3 mg/dL (ref 0.2–1.2)
Total Protein: 6.5 g/dL (ref 6.0–8.3)

## 2014-03-21 LAB — CBC WITH DIFFERENTIAL/PLATELET
Basophils Absolute: 0 10*3/uL (ref 0.0–0.1)
Basophils Relative: 0 % (ref 0–1)
Eosinophils Absolute: 0.1 10*3/uL (ref 0.0–0.7)
Eosinophils Relative: 1 % (ref 0–5)
HCT: 44.2 % (ref 36.0–46.0)
Hemoglobin: 15.3 g/dL — ABNORMAL HIGH (ref 12.0–15.0)
Lymphocytes Relative: 23 % (ref 12–46)
Lymphs Abs: 2.8 10*3/uL (ref 0.7–4.0)
MCH: 33 pg (ref 26.0–34.0)
MCHC: 34.6 g/dL (ref 30.0–36.0)
MCV: 95.5 fL (ref 78.0–100.0)
MPV: 10.6 fL (ref 8.6–12.4)
Monocytes Absolute: 0.6 10*3/uL (ref 0.1–1.0)
Monocytes Relative: 5 % (ref 3–12)
Neutro Abs: 8.5 10*3/uL — ABNORMAL HIGH (ref 1.7–7.7)
Neutrophils Relative %: 71 % (ref 43–77)
Platelets: 293 10*3/uL (ref 150–400)
RBC: 4.63 MIL/uL (ref 3.87–5.11)
RDW: 13.2 % (ref 11.5–15.5)
WBC: 12 10*3/uL — ABNORMAL HIGH (ref 4.0–10.5)

## 2014-03-21 LAB — POCT URINALYSIS DIPSTICK
Bilirubin, UA: NEGATIVE
Glucose, UA: NEGATIVE
Ketones, UA: NEGATIVE
Leukocytes, UA: NEGATIVE
Nitrite, UA: NEGATIVE
Protein, UA: NEGATIVE
Spec Grav, UA: 1.02
Urobilinogen, UA: 0.2
pH, UA: 5.5

## 2014-03-21 LAB — AMYLASE: Amylase: 42 U/L (ref 0–105)

## 2014-03-21 LAB — LIPASE: Lipase: 27 U/L (ref 0–75)

## 2014-03-21 NOTE — Progress Notes (Signed)
Pt is here to establish care. Pt states that she has a hard time sleeping. Pt reports having a lump under her right arm. Pt states that she has chronic headaches and pain in her neck and back.

## 2014-03-21 NOTE — Patient Instructions (Signed)
Smoking Cessation Quitting smoking is important to your health and has many advantages. However, it is not always easy to quit since nicotine is a very addictive drug. Oftentimes, people try 3 times or more before being able to quit. This document explains the best ways for you to prepare to quit smoking. Quitting takes hard work and a lot of effort, but you can do it. ADVANTAGES OF QUITTING SMOKING  You will live longer, feel better, and live better.  Your body will feel the impact of quitting smoking almost immediately.  Within 20 minutes, blood pressure decreases. Your pulse returns to its normal level.  After 8 hours, carbon monoxide levels in the blood return to normal. Your oxygen level increases.  After 24 hours, the chance of having a heart attack starts to decrease. Your breath, hair, and body stop smelling like smoke.  After 48 hours, damaged nerve endings begin to recover. Your sense of taste and smell improve.  After 72 hours, the body is virtually free of nicotine. Your bronchial tubes relax and breathing becomes easier.  After 2 to 12 weeks, lungs can hold more air. Exercise becomes easier and circulation improves.  The risk of having a heart attack, stroke, cancer, or lung disease is greatly reduced.  After 1 year, the risk of coronary heart disease is cut in half.  After 5 years, the risk of stroke falls to the same as a nonsmoker.  After 10 years, the risk of lung cancer is cut in half and the risk of other cancers decreases significantly.  After 15 years, the risk of coronary heart disease drops, usually to the level of a nonsmoker.  If you are pregnant, quitting smoking will improve your chances of having a healthy baby.  The people you live with, especially any children, will be healthier.  You will have extra money to spend on things other than cigarettes. QUESTIONS TO THINK ABOUT BEFORE ATTEMPTING TO QUIT You may want to talk about your answers with your  health care provider.  Why do you want to quit?  If you tried to quit in the past, what helped and what did not?  What will be the most difficult situations for you after you quit? How will you plan to handle them?  Who can help you through the tough times? Your family? Friends? A health care provider?  What pleasures do you get from smoking? What ways can you still get pleasure if you quit? Here are some questions to ask your health care provider:  How can you help me to be successful at quitting?  What medicine do you think would be best for me and how should I take it?  What should I do if I need more help?  What is smoking withdrawal like? How can I get information on withdrawal? GET READY  Set a quit date.  Change your environment by getting rid of all cigarettes, ashtrays, matches, and lighters in your home, car, or work. Do not let people smoke in your home.  Review your past attempts to quit. Think about what worked and what did not. GET SUPPORT AND ENCOURAGEMENT You have a better chance of being successful if you have help. You can get support in many ways.  Tell your family, friends, and coworkers that you are going to quit and need their support. Ask them not to smoke around you.  Get individual, group, or telephone counseling and support. Programs are available at local hospitals and health centers. Call   your local health department for information about programs in your area.  Spiritual beliefs and practices may help some smokers quit.  Download a "quit meter" on your computer to keep track of quit statistics, such as how long you have gone without smoking, cigarettes not smoked, and money saved.  Get a self-help book about quitting smoking and staying off tobacco. LEARN NEW SKILLS AND BEHAVIORS  Distract yourself from urges to smoke. Talk to someone, go for a walk, or occupy your time with a task.  Change your normal routine. Take a different route to work.  Drink tea instead of coffee. Eat breakfast in a different place.  Reduce your stress. Take a hot bath, exercise, or read a book.  Plan something enjoyable to do every day. Reward yourself for not smoking.  Explore interactive web-based programs that specialize in helping you quit. GET MEDICINE AND USE IT CORRECTLY Medicines can help you stop smoking and decrease the urge to smoke. Combining medicine with the above behavioral methods and support can greatly increase your chances of successfully quitting smoking.  Nicotine replacement therapy helps deliver nicotine to your body without the negative effects and risks of smoking. Nicotine replacement therapy includes nicotine gum, lozenges, inhalers, nasal sprays, and skin patches. Some may be available over-the-counter and others require a prescription.  Antidepressant medicine helps people abstain from smoking, but how this works is unknown. This medicine is available by prescription.  Nicotinic receptor partial agonist medicine simulates the effect of nicotine in your brain. This medicine is available by prescription. Ask your health care provider for advice about which medicines to use and how to use them based on your health history. Your health care provider will tell you what side effects to look out for if you choose to be on a medicine or therapy. Carefully read the information on the package. Do not use any other product containing nicotine while using a nicotine replacement product.  RELAPSE OR DIFFICULT SITUATIONS Most relapses occur within the first 3 months after quitting. Do not be discouraged if you start smoking again. Remember, most people try several times before finally quitting. You may have symptoms of withdrawal because your body is used to nicotine. You may crave cigarettes, be irritable, feel very hungry, cough often, get headaches, or have difficulty concentrating. The withdrawal symptoms are only temporary. They are strongest  when you first quit, but they will go away within 10-14 days. To reduce the chances of relapse, try to:  Avoid drinking alcohol. Drinking lowers your chances of successfully quitting.  Reduce the amount of caffeine you consume. Once you quit smoking, the amount of caffeine in your body increases and can give you symptoms, such as a rapid heartbeat, sweating, and anxiety.  Avoid smokers because they can make you want to smoke.  Do not let weight gain distract you. Many smokers will gain weight when they quit, usually less than 10 pounds. Eat a healthy diet and stay active. You can always lose the weight gained after you quit.  Find ways to improve your mood other than smoking. FOR MORE INFORMATION  www.smokefree.gov  Document Released: 12/18/2000 Document Revised: 05/10/2013 Document Reviewed: 04/04/2011 Exit[employer] Patient Information 2015 [employer]. This information is not intended to replace advice given to you by your health care provider. Make sure you discuss any questions you have with your health care provider.  

## 2014-03-21 NOTE — Progress Notes (Signed)
Patient ID: Kimberly [name], female   DOB: 1950-09-21, 64 y.o.   MRN: [id number]  [id number]  NTI:[id number]  DOB - March 10, 1950  CC:  Chief Complaint  Patient presents with  . Establish Care       HPI: Kimberly [name] is a 64 y.o. female here today to establish medical care.  Patient has a past medical history of severe carotid artery stenosis with bilateral carotid endarterectomy with last one completed 7 months ago.  She is still under the care of Dr. Trula Slade for vascular follow ups.  She presents today with complaints of of headaches, lower back pain, and a lump under her right axilla.    Patient states that since her graft she has been having pin in her RUQ since September when Vascular went in through her abdomen.  She states that she had delayed healing of the surgery site due to infection. The pain is dull and she believes it is radiating to her right lower back.   Daily headaches been present since September. Described as a pressure, begins in the crown of her head. No nausea or vomiting. Some light sensitivity, exacerbated by getting up to quickly--dizziness. Pain is constant and radiates to her neck.  Has had headaches in the past but they were very rare.   She c/o of a lump under right arm that has been present since September.  Lump is not painful, has not had a mammogram in 1.5 years ago. No size changes in the lump. She denies any breast changes.    Allergies  Allergen Reactions  . Hydrocodone Nausea And Vomiting and Other (See Comments)    Headache and dizziness  . Other Shortness Of Breath    Cologne  . Oxycodone Nausea And Vomiting and Other (See Comments)    Dizziness   . Percocet [Oxycodone-Acetaminophen] Nausea And Vomiting  . Codeine Nausea Only    dizziness   Past Medical History  Diagnosis Date  . Asthma   . Hyperlipidemia   . History of lead poisoning 1977  . Shortness of breath   . Peripheral vascular disease     claudication  right greater than left   femoral artery  . Anxiety   . Blood transfusion   . Migraine   . Subclavian artery disease     left  . Small bowel obstruction, partial 04/08/11  . Angina     March 2013  . Depression   . Uterine cancer     Pre cervical  . Fibromyalgia   . Anemia   . Stroke    Current Outpatient Prescriptions on File Prior to Visit  Medication Sig Dispense Refill  . aspirin 325 MG tablet Take 325 mg by mouth daily.    . clopidogrel (PLAVIX) 75 MG tablet Take 1 tablet (75 mg total) by mouth daily. 90 tablet 2  . acetaminophen (TYLENOL) 325 MG tablet Take 650 mg by mouth every 6 (six) hours as needed for pain.    Marland Kitchen ibuprofen (ADVIL,MOTRIN) 200 MG tablet Take 400-600 mg by mouth every 6 (six) hours as needed for pain.     Marland Kitchen levofloxacin (LEVAQUIN) 500 MG tablet Take 500 mg by mouth daily.    . polyethylene glycol (MIRALAX / GLYCOLAX) packet Take 17 g by mouth daily as needed (constipation).     No current facility-administered medications on file prior to visit.   Family History  Problem Relation Age of Onset  . Cancer Mother     breast, colon, ovarian  .  Anesthesia problems Neg Hx   . Cancer Brother 50    lung  . Hyperlipidemia Brother   . Cancer Other   . Stroke Other   . Cancer Maternal Grandmother     ovarian   History   Social History  . Marital Status: Legally Separated    Spouse Name: N/A  . Number of Children: N/A  . Years of Education: N/A   Occupational History  . Not on file.   Social History Main Topics  . Smoking status: Current Some Day Smoker -- 0.05 packs/day for 45 years    Types: Cigarettes  . Smokeless tobacco: Never Used     Comment: pt states that she smokes about 6 cigs per day  . Alcohol Use: No  . Drug Use: No     Comment: History of Cocaine and marijuana abuse: "not in many years" (04/08/11  . Sexual Activity: Not Currently   Other Topics Concern  . Not on file   Social History Narrative   Pt lives at home with her 2 grandsons. She has been separated  for 73yrs, has two children, and has a 12th grade education level. She drinks 3 cups of coffee daily.    Review of Systems: See HPI   Objective:   Filed Vitals:   03/21/14 1208  BP: 106/72  Pulse: 75  Temp: 98.2 F (36.8 C)  Resp: 16    Physical Exam  Constitutional: She is oriented to person, place, and time.  Cardiovascular: Normal rate, regular rhythm and normal heart sounds.   Pulmonary/Chest: Effort normal and breath sounds normal. Right breast exhibits mass. Right breast exhibits no inverted nipple. Left breast exhibits no inverted nipple.    Abdominal: Soft. There is tenderness (RUQ).  Genitourinary: There is breast tenderness. No breast discharge.  Musculoskeletal:  TTP right paraspinal muscle   Neurological: She is alert and oriented to person, place, and time.  Skin: Skin is warm and dry.     Lab Results  Component Value Date   WBC 18.7* 08/28/2013   HGB 13.8 08/28/2013   HCT 39.2 08/28/2013   MCV 96.8 08/28/2013   PLT 234 08/28/2013   Lab Results  Component Value Date   CREATININE 0.78 08/28/2013   BUN 13 08/28/2013   NA 141 08/28/2013   K 3.6* 08/28/2013   CL 101 08/28/2013   CO2 26 08/28/2013    No results found for: HGBA1C Lipid Panel     Component Value Date/Time   CHOL 233* 03/27/2010 2025   TRIG 63 03/27/2010 2025   HDL 57 03/27/2010 2025   CHOLHDL 4.1 Ratio 03/27/2010 2025   VLDL 13 03/27/2010 2025   LDLCALC 163* 03/27/2010 2025       Assessment and plan:   Kimberly [name] was seen today for establish care.  Diagnoses and all orders for this visit:  Axillary mass, right I will send patient for a mammogram asap  RUQ pain Orders: -     Lipase -     Amylase -     US Abdomen Complete; Future -     CBC with Differential -     COMPLETE METABOLIC PANEL WITH GFR Patient reports removal of gallbladder a couple of years ago. Will r/o pancreatitis of any other etiology causing pain\  Right-sided low back pain without sciatica Orders: -      POCT urinalysis dipstick---negative   Nonintractable headache, unspecified chronicity pattern, unspecified headache type May be due to some vascular cause. Will look at mammogram  readings and abdominal U/S to make sure patient does not have a serious disease manifesting such as breast cancer.   Smoking Smoking cessation discussed for 3 minutes, patient is not willing to quit at this time. Will continue to assess on each visit. Discussed increased risk for diseases such as cancer, heart disease, and stroke. Explained that patient does not need to smoke with vascular history and how smoking can cause more complications.  Return for pending results.  The patient was given clear instructions to go to ER or return to medical center if symptoms don't improve, worsen or new problems develop. The patient verbalized understanding. The patient was told to call to get lab results if they haven't heard anything in the next week.     Chari Manning, NP-C [facility] [phone] 03/21/2014, 12:51 PM

## 2014-03-23 ENCOUNTER — Telehealth: Payer: Self-pay | Admitting: *Deleted

## 2014-03-23 NOTE — Telephone Encounter (Signed)
-----   Message from Lance Bosch, NP sent at 03/22/2014  6:41 PM EDT ----- Labs do not reveal anything significant. Will call patient with ultrasound results

## 2014-03-23 NOTE — Telephone Encounter (Signed)
Pt is aware of her lab results.  

## 2014-03-24 ENCOUNTER — Other Ambulatory Visit: Payer: Self-pay

## 2014-03-24 ENCOUNTER — Other Ambulatory Visit: Payer: Medicare Other

## 2014-03-24 ENCOUNTER — Other Ambulatory Visit: Payer: Self-pay | Admitting: Internal Medicine

## 2014-03-24 DIAGNOSIS — N63 Unspecified lump in unspecified breast: Secondary | ICD-10-CM

## 2014-03-25 ENCOUNTER — Ambulatory Visit
Admission: RE | Admit: 2014-03-25 | Discharge: 2014-03-25 | Disposition: A | Discharge status: Home / Self Care | Payer: Medicare Other | Source: Ambulatory Visit | Attending: Internal Medicine | Admitting: Internal Medicine

## 2014-03-25 DIAGNOSIS — N6489 Other specified disorders of breast: Secondary | ICD-10-CM | POA: Diagnosis not present

## 2014-03-25 DIAGNOSIS — N63 Unspecified lump in unspecified breast: Secondary | ICD-10-CM

## 2014-03-25 DIAGNOSIS — N6459 Other signs and symptoms in breast: Secondary | ICD-10-CM | POA: Diagnosis not present

## 2014-03-28 ENCOUNTER — Ambulatory Visit (HOSPITAL_COMMUNITY)
Admission: RE | Admit: 2014-03-28 | Discharge: 2014-03-28 | Disposition: A | Discharge status: Home / Self Care | Payer: Medicare Other | Source: Ambulatory Visit | Attending: Internal Medicine | Admitting: Internal Medicine

## 2014-03-28 DIAGNOSIS — R1011 Right upper quadrant pain: Secondary | ICD-10-CM | POA: Diagnosis not present

## 2014-03-29 ENCOUNTER — Telehealth: Payer: Self-pay | Admitting: *Deleted

## 2014-03-29 NOTE — Telephone Encounter (Signed)
-----   Message from Lance Bosch, NP sent at 03/27/2014  9:17 PM EDT ----- Negative finding on mammography. This is a great sign. Will need repeat test in 1 year

## 2014-03-29 NOTE — Telephone Encounter (Signed)
Left message on VM to return my call. 

## 2014-03-30 ENCOUNTER — Telehealth: Payer: Self-pay | Admitting: Internal Medicine

## 2014-03-30 NOTE — Telephone Encounter (Signed)
Patient is returning call from [place], please f/u with pt.

## 2014-04-12 ENCOUNTER — Telehealth: Payer: Self-pay | Admitting: *Deleted

## 2014-04-12 DIAGNOSIS — R1084 Generalized abdominal pain: Secondary | ICD-10-CM

## 2014-04-12 NOTE — Telephone Encounter (Signed)
Pt is aware of her results. I placed the referral.

## 2014-04-12 NOTE — Telephone Encounter (Signed)
-----   Message from Lance Bosch, NP sent at 03/31/2014  6:15 PM EDT ----- May need referral to GI for abdominal pain. Just shows a large bile duct which is similar to past studies

## 2014-04-20 ENCOUNTER — Encounter: Payer: Self-pay | Admitting: Internal Medicine

## 2014-04-20 ENCOUNTER — Ambulatory Visit: Payer: Medicare Other | Attending: Internal Medicine | Admitting: Internal Medicine

## 2014-04-20 VITALS — BP 160/81 | HR 72 | Temp 98.0°F | Resp 16 | Ht 64.0 in | Wt 149.0 lb

## 2014-04-20 DIAGNOSIS — I6523 Occlusion and stenosis of bilateral carotid arteries: Secondary | ICD-10-CM

## 2014-04-20 DIAGNOSIS — R05 Cough: Secondary | ICD-10-CM

## 2014-04-20 DIAGNOSIS — IMO0001 Reserved for inherently not codable concepts without codable children: Secondary | ICD-10-CM

## 2014-04-20 DIAGNOSIS — R059 Cough, unspecified: Secondary | ICD-10-CM

## 2014-04-20 DIAGNOSIS — Z720 Tobacco use: Secondary | ICD-10-CM | POA: Diagnosis not present

## 2014-04-20 DIAGNOSIS — R51 Headache: Secondary | ICD-10-CM

## 2014-04-20 DIAGNOSIS — F17200 Nicotine dependence, unspecified, uncomplicated: Secondary | ICD-10-CM

## 2014-04-20 DIAGNOSIS — R519 Headache, unspecified: Secondary | ICD-10-CM

## 2014-04-20 MED ORDER — NICOTINE 14 MG/24HR TD PT24: 14.0000 mg | MEDICATED_PATCH | Freq: Every day | TRANSDERMAL | Status: DC

## 2014-04-20 MED ORDER — KETOROLAC TROMETHAMINE 30 MG/ML IJ SOLN
30.0000 mg | Freq: Once | INTRAMUSCULAR | Status: AC
  Administered 2014-04-20: 30 mg via INTRAMUSCULAR

## 2014-04-20 NOTE — Patient Instructions (Signed)
Smoking Cessation Quitting smoking is important to your health and has many advantages. However, it is not always easy to quit since nicotine is a very addictive drug. Oftentimes, people try 3 times or more before being able to quit. This document explains the best ways for you to prepare to quit smoking. Quitting takes hard work and a lot of effort, but you can do it. ADVANTAGES OF QUITTING SMOKING  You will live longer, feel better, and live better.  Your body will feel the impact of quitting smoking almost immediately.  Within 20 minutes, blood pressure decreases. Your pulse returns to its normal level.  After 8 hours, carbon monoxide levels in the blood return to normal. Your oxygen level increases.  After 24 hours, the chance of having a heart attack starts to decrease. Your breath, hair, and body stop smelling like smoke.  After 48 hours, damaged nerve endings begin to recover. Your sense of taste and smell improve.  After 72 hours, the body is virtually free of nicotine. Your bronchial tubes relax and breathing becomes easier.  After 2 to 12 weeks, lungs can hold more air. Exercise becomes easier and circulation improves.  The risk of having a heart attack, stroke, cancer, or lung disease is greatly reduced.  After 1 year, the risk of coronary heart disease is cut in half.  After 5 years, the risk of stroke falls to the same as a nonsmoker.  After 10 years, the risk of lung cancer is cut in half and the risk of other cancers decreases significantly.  After 15 years, the risk of coronary heart disease drops, usually to the level of a nonsmoker.  If you are pregnant, quitting smoking will improve your chances of having a healthy baby.  The people you live with, especially any children, will be healthier.  You will have extra money to spend on things other than cigarettes. QUESTIONS TO THINK ABOUT BEFORE ATTEMPTING TO QUIT You may want to talk about your answers with your  health care provider.  Why do you want to quit?  If you tried to quit in the past, what helped and what did not?  What will be the most difficult situations for you after you quit? How will you plan to handle them?  Who can help you through the tough times? Your family? Friends? A health care provider?  What pleasures do you get from smoking? What ways can you still get pleasure if you quit? Here are some questions to ask your health care provider:  How can you help me to be successful at quitting?  What medicine do you think would be best for me and how should I take it?  What should I do if I need more help?  What is smoking withdrawal like? How can I get information on withdrawal? GET READY  Set a quit date.  Change your environment by getting rid of all cigarettes, ashtrays, matches, and lighters in your home, car, or work. Do not let people smoke in your home.  Review your past attempts to quit. Think about what worked and what did not. GET SUPPORT AND ENCOURAGEMENT You have a better chance of being successful if you have help. You can get support in many ways.  Tell your family, friends, and coworkers that you are going to quit and need their support. Ask them not to smoke around you.  Get individual, group, or telephone counseling and support. Programs are available at local hospitals and health centers. Call   your local health department for information about programs in your area.  Spiritual beliefs and practices may help some smokers quit.  Download a "quit meter" on your computer to keep track of quit statistics, such as how long you have gone without smoking, cigarettes not smoked, and money saved.  Get a self-help book about quitting smoking and staying off tobacco. LEARN NEW SKILLS AND BEHAVIORS  Distract yourself from urges to smoke. Talk to someone, go for a walk, or occupy your time with a task.  Change your normal routine. Take a different route to work.  Drink tea instead of coffee. Eat breakfast in a different place.  Reduce your stress. Take a hot bath, exercise, or read a book.  Plan something enjoyable to do every day. Reward yourself for not smoking.  Explore interactive web-based programs that specialize in helping you quit. GET MEDICINE AND USE IT CORRECTLY Medicines can help you stop smoking and decrease the urge to smoke. Combining medicine with the above behavioral methods and support can greatly increase your chances of successfully quitting smoking.  Nicotine replacement therapy helps deliver nicotine to your body without the negative effects and risks of smoking. Nicotine replacement therapy includes nicotine gum, lozenges, inhalers, nasal sprays, and skin patches. Some may be available over-the-counter and others require a prescription.  Antidepressant medicine helps people abstain from smoking, but how this works is unknown. This medicine is available by prescription.  Nicotinic receptor partial agonist medicine simulates the effect of nicotine in your brain. This medicine is available by prescription. Ask your health care provider for advice about which medicines to use and how to use them based on your health history. Your health care provider will tell you what side effects to look out for if you choose to be on a medicine or therapy. Carefully read the information on the package. Do not use any other product containing nicotine while using a nicotine replacement product.  RELAPSE OR DIFFICULT SITUATIONS Most relapses occur within the first 3 months after quitting. Do not be discouraged if you start smoking again. Remember, most people try several times before finally quitting. You may have symptoms of withdrawal because your body is used to nicotine. You may crave cigarettes, be irritable, feel very hungry, cough often, get headaches, or have difficulty concentrating. The withdrawal symptoms are only temporary. They are strongest  when you first quit, but they will go away within 10-14 days. To reduce the chances of relapse, try to:  Avoid drinking alcohol. Drinking lowers your chances of successfully quitting.  Reduce the amount of caffeine you consume. Once you quit smoking, the amount of caffeine in your body increases and can give you symptoms, such as a rapid heartbeat, sweating, and anxiety.  Avoid smokers because they can make you want to smoke.  Do not let weight gain distract you. Many smokers will gain weight when they quit, usually less than 10 pounds. Eat a healthy diet and stay active. You can always lose the weight gained after you quit.  Find ways to improve your mood other than smoking. FOR MORE INFORMATION  www.smokefree.gov  Document Released: 12/18/2000 Document Revised: 05/10/2013 Document Reviewed: 04/04/2011 Exit[employer] Patient Information 2015 [employer]. This information is not intended to replace advice given to you by your health care provider. Make sure you discuss any questions you have with your health care provider.  

## 2014-04-20 NOTE — Progress Notes (Signed)
Pt is here today c/o of mold in her house and now she is claiming that her and her grandson have this persistent cough due to the mold in her house.

## 2014-04-20 NOTE — Progress Notes (Signed)
Patient ID: Kimberly [name], female   DOB: March 14, 1950, 64 y.o.   MRN: [id number]  CC: cough  HPI: Kimberly [name] is a 64 y.o. female here today for a follow up visit.  Patient has past medical history of PVD, stroke, anemia, asthma, anxiety. Patient has had a cough for the past 10 months. She denies fevers, chills, vomiting, chest pain, wheezing. Has been a smoker for 50 years. She notes that she has had a problem with mold exposure in her home which she believes is causing her and her 98 year old grandson to cough. She reports headaches, yellow mucous production, and SOB. No history of COPD. During exam patient c/o of severe headache with photophobia. No nausea, vomiting, or neurological symptoms.    Allergies  Allergen Reactions  . Hydrocodone Nausea And Vomiting and Other (See Comments)    Headache and dizziness  . Other Shortness Of Breath    Cologne  . Oxycodone Nausea And Vomiting and Other (See Comments)    Dizziness   . Percocet [Oxycodone-Acetaminophen] Nausea And Vomiting  . Codeine Nausea Only    dizziness   Past Medical History  Diagnosis Date  . Asthma   . Hyperlipidemia   . History of lead poisoning 1977  . Shortness of breath   . Peripheral vascular disease     claudication  right greater than left  femoral artery  . Anxiety   . Blood transfusion   . Migraine   . Subclavian artery disease     left  . Small bowel obstruction, partial 04/08/11  . Angina     March 2013  . Depression   . Uterine cancer     Pre cervical  . Fibromyalgia   . Anemia   . Stroke    Current Outpatient Prescriptions on File Prior to Visit  Medication Sig Dispense Refill  . acetaminophen (TYLENOL) 325 MG tablet Take 650 mg by mouth every 6 (six) hours as needed for pain.    Marland Kitchen aspirin 325 MG tablet Take 325 mg by mouth daily.    . clopidogrel (PLAVIX) 75 MG tablet Take 1 tablet (75 mg total) by mouth daily. 90 tablet 2  . ibuprofen (ADVIL,MOTRIN) 200 MG tablet Take 400-600 mg by mouth  every 6 (six) hours as needed for pain.     . polyethylene glycol (MIRALAX / GLYCOLAX) packet Take 17 g by mouth daily as needed (constipation).    Marland Kitchen levofloxacin (LEVAQUIN) 500 MG tablet Take 500 mg by mouth daily.     No current facility-administered medications on file prior to visit.   Family History  Problem Relation Age of Onset  . Cancer Mother     breast, colon, ovarian  . Anesthesia problems Neg Hx   . Cancer Brother 50    lung  . Hyperlipidemia Brother   . Cancer Other   . Stroke Other   . Cancer Maternal Grandmother     ovarian   History   Social History  . Marital Status: Legally Separated    Spouse Name: N/A  . Number of Children: N/A  . Years of Education: N/A   Occupational History  . Not on file.   Social History Main Topics  . Smoking status: Current Some Day Smoker -- 0.05 packs/day for 45 years    Types: Cigarettes  . Smokeless tobacco: Never Used     Comment: pt states that she smokes about 6 cigs per day  . Alcohol Use: No  . Drug Use: No  Comment: History of Cocaine and marijuana abuse: "not in many years" (04/08/11  . Sexual Activity: Not Currently   Other Topics Concern  . Not on file   Social History Narrative   Pt lives at home with her 2 grandsons. She has been separated for 88yrs, has two children, and has a 12th grade education level. She drinks 3 cups of coffee daily.    Review of Systems: See HPI     Objective:   Filed Vitals:   04/20/14 1039  BP: 160/81  Pulse: 72  Temp: 98 F (36.7 C)  Resp: 16   Physical Exam  Constitutional: She is oriented to person, place, and time.  Eyes: Conjunctivae and EOM are normal.  Cardiovascular: Normal rate, regular rhythm and normal heart sounds.   Pulmonary/Chest: Effort normal. No respiratory distress. She has wheezes (in right lower lung base).  Neurological: She is alert and oriented to person, place, and time. No cranial nerve deficit.     Lab Results  Component Value Date    WBC 12.0* 03/21/2014   HGB 15.3* 03/21/2014   HCT 44.2 03/21/2014   MCV 95.5 03/21/2014   PLT 293 03/21/2014   Lab Results  Component Value Date   CREATININE 0.86 03/21/2014   BUN 13 03/21/2014   NA 141 03/21/2014   K 4.9 03/21/2014   CL 102 03/21/2014   CO2 26 03/21/2014    No results found for: HGBA1C Lipid Panel     Component Value Date/Time   CHOL 233* 03/27/2010 2025   TRIG 63 03/27/2010 2025   HDL 57 03/27/2010 2025   CHOLHDL 4.1 Ratio 03/27/2010 2025   VLDL 13 03/27/2010 2025   LDLCALC 163* 03/27/2010 2025       Assessment and plan:   Kimberly [name] was seen today for follow-up.  Diagnoses and all orders for this visit:  Cough Orders: -     DG Chest 2 View; Future I have explained to patient that mold is a respiratory irritant but it is very likely that she has COPD as well. I have advised patient to inform her landlord. If no results from rental agency she may inform health department for them to come and inspect her home for suitable living conditions   Acute nonintractable headache, unspecified headache type Orders: -     ketorolac (TORADOL) 30 MG/ML injection 30 mg; Inject 1 mL (30 mg total) into the muscle once.  Smoking Orders: -     nicotine (NICODERM CQ - DOSED IN MG/24 HOURS) 14 mg/24hr patch; Place 1 patch (14 mg total) onto the skin daily. I have went over harmful effects of smoking and how she is placing her self at risk for several health complications. Patient will try nicotine patch.  Return if symptoms worsen or fail to improve.       Chari Manning, NP-C [facility] [phone] 04/20/2014, 11:37 AM

## 2014-04-27 ENCOUNTER — Ambulatory Visit (HOSPITAL_COMMUNITY)
Admission: RE | Admit: 2014-04-27 | Discharge: 2014-04-27 | Disposition: A | Discharge status: Home / Self Care | Payer: Medicare Other | Source: Ambulatory Visit | Attending: Internal Medicine | Admitting: Internal Medicine

## 2014-04-27 DIAGNOSIS — R05 Cough: Secondary | ICD-10-CM | POA: Insufficient documentation

## 2014-04-27 DIAGNOSIS — R0602 Shortness of breath: Secondary | ICD-10-CM | POA: Insufficient documentation

## 2014-04-27 DIAGNOSIS — R059 Cough, unspecified: Secondary | ICD-10-CM

## 2014-05-09 ENCOUNTER — Telehealth: Payer: Self-pay | Admitting: General Practice

## 2014-05-09 NOTE — Telephone Encounter (Signed)
Patient calling for x ray results. Please assist

## 2014-05-16 ENCOUNTER — Telehealth: Payer: Self-pay | Admitting: *Deleted

## 2014-05-16 NOTE — Telephone Encounter (Signed)
-----   Message from Lance Bosch, NP sent at 05/12/2014  7:52 AM EDT ----- Chronic pulmonary changes likely related to smoking. Nothing acute. I highly advise patient to quit smoking asap before she suffers from COPD

## 2014-05-16 NOTE — Telephone Encounter (Signed)
Left VM for pt to return my call  

## 2014-05-16 NOTE — Telephone Encounter (Signed)
Pt returning call

## 2014-06-08 ENCOUNTER — Other Ambulatory Visit: Payer: Self-pay | Admitting: Gastroenterology

## 2014-06-08 DIAGNOSIS — R109 Unspecified abdominal pain: Secondary | ICD-10-CM | POA: Diagnosis not present

## 2014-06-08 DIAGNOSIS — Z800 Family history of malignant neoplasm of digestive organs: Secondary | ICD-10-CM | POA: Diagnosis not present

## 2014-06-08 DIAGNOSIS — Z1159 Encounter for screening for other viral diseases: Secondary | ICD-10-CM | POA: Diagnosis not present

## 2014-06-08 DIAGNOSIS — R1310 Dysphagia, unspecified: Secondary | ICD-10-CM | POA: Diagnosis not present

## 2014-06-09 ENCOUNTER — Encounter (HOSPITAL_COMMUNITY): Payer: Self-pay | Admitting: *Deleted

## 2014-06-16 ENCOUNTER — Ambulatory Visit (HOSPITAL_COMMUNITY): Payer: Medicare Other | Admitting: Anesthesiology

## 2014-06-16 ENCOUNTER — Encounter (HOSPITAL_COMMUNITY)
Admission: RE | Disposition: A | Discharge status: Home / Self Care | Payer: Self-pay | Source: Ambulatory Visit | Attending: Gastroenterology

## 2014-06-16 ENCOUNTER — Ambulatory Visit (HOSPITAL_COMMUNITY)
Admission: RE | Admit: 2014-06-16 | Discharge: 2014-06-16 | Disposition: A | Discharge status: Home / Self Care | Payer: Medicare Other | Source: Ambulatory Visit | Attending: Gastroenterology | Admitting: Gastroenterology

## 2014-06-16 ENCOUNTER — Encounter (HOSPITAL_COMMUNITY): Payer: Self-pay

## 2014-06-16 DIAGNOSIS — Z951 Presence of aortocoronary bypass graft: Secondary | ICD-10-CM | POA: Diagnosis not present

## 2014-06-16 DIAGNOSIS — I739 Peripheral vascular disease, unspecified: Secondary | ICD-10-CM | POA: Diagnosis not present

## 2014-06-16 DIAGNOSIS — G709 Myoneural disorder, unspecified: Secondary | ICD-10-CM | POA: Insufficient documentation

## 2014-06-16 DIAGNOSIS — E780 Pure hypercholesterolemia: Secondary | ICD-10-CM | POA: Diagnosis not present

## 2014-06-16 DIAGNOSIS — R1031 Right lower quadrant pain: Secondary | ICD-10-CM | POA: Insufficient documentation

## 2014-06-16 DIAGNOSIS — G43909 Migraine, unspecified, not intractable, without status migrainosus: Secondary | ICD-10-CM | POA: Insufficient documentation

## 2014-06-16 DIAGNOSIS — J45909 Unspecified asthma, uncomplicated: Secondary | ICD-10-CM | POA: Diagnosis not present

## 2014-06-16 DIAGNOSIS — Z7902 Long term (current) use of antithrombotics/antiplatelets: Secondary | ICD-10-CM | POA: Insufficient documentation

## 2014-06-16 DIAGNOSIS — F418 Other specified anxiety disorders: Secondary | ICD-10-CM | POA: Insufficient documentation

## 2014-06-16 DIAGNOSIS — R1310 Dysphagia, unspecified: Secondary | ICD-10-CM | POA: Diagnosis not present

## 2014-06-16 DIAGNOSIS — Z8673 Personal history of transient ischemic attack (TIA), and cerebral infarction without residual deficits: Secondary | ICD-10-CM | POA: Diagnosis not present

## 2014-06-16 DIAGNOSIS — F17200 Nicotine dependence, unspecified, uncomplicated: Secondary | ICD-10-CM | POA: Insufficient documentation

## 2014-06-16 DIAGNOSIS — M797 Fibromyalgia: Secondary | ICD-10-CM | POA: Diagnosis not present

## 2014-06-16 DIAGNOSIS — R1319 Other dysphagia: Secondary | ICD-10-CM | POA: Insufficient documentation

## 2014-06-16 DIAGNOSIS — Z79899 Other long term (current) drug therapy: Secondary | ICD-10-CM | POA: Insufficient documentation

## 2014-06-16 DIAGNOSIS — R109 Unspecified abdominal pain: Secondary | ICD-10-CM | POA: Diagnosis not present

## 2014-06-16 DIAGNOSIS — Z7982 Long term (current) use of aspirin: Secondary | ICD-10-CM | POA: Diagnosis not present

## 2014-06-16 DIAGNOSIS — I10 Essential (primary) hypertension: Secondary | ICD-10-CM | POA: Diagnosis not present

## 2014-06-16 DIAGNOSIS — Z800 Family history of malignant neoplasm of digestive organs: Secondary | ICD-10-CM | POA: Diagnosis not present

## 2014-06-16 HISTORY — PX: ESOPHAGOGASTRODUODENOSCOPY (EGD) WITH PROPOFOL: SHX5813

## 2014-06-16 HISTORY — PX: COLONOSCOPY WITH PROPOFOL: SHX5780

## 2014-06-16 HISTORY — DX: Essential (primary) hypertension: I10

## 2014-06-16 SURGERY — ESOPHAGOGASTRODUODENOSCOPY (EGD) WITH PROPOFOL
Anesthesia: Monitor Anesthesia Care

## 2014-06-16 MED ORDER — SODIUM CHLORIDE 0.9 % IV SOLN: INTRAVENOUS | Status: DC

## 2014-06-16 MED ORDER — PHENYLEPHRINE HCL 10 MG/ML IJ SOLN
INTRAMUSCULAR | Status: DC | PRN
  Administered 2014-06-16 (×2): 40 ug via INTRAVENOUS

## 2014-06-16 MED ORDER — LACTATED RINGERS IV SOLN
INTRAVENOUS | Status: DC
  Administered 2014-06-16: 1000 mL via INTRAVENOUS

## 2014-06-16 MED ORDER — PROPOFOL 10 MG/ML IV BOLUS
INTRAVENOUS | Status: AC
  Filled 2014-06-16: qty 20

## 2014-06-16 MED ORDER — PROPOFOL INFUSION 10 MG/ML OPTIME
INTRAVENOUS | Status: DC | PRN
  Administered 2014-06-16: 100 ug/kg/min via INTRAVENOUS

## 2014-06-16 MED ORDER — BUTAMBEN-TETRACAINE-BENZOCAINE 2-2-14 % EX AERO
INHALATION_SPRAY | CUTANEOUS | Status: DC | PRN
  Administered 2014-06-16: 1 via TOPICAL

## 2014-06-16 MED ORDER — PHENYLEPHRINE 40 MCG/ML (10ML) SYRINGE FOR IV PUSH (FOR BLOOD PRESSURE SUPPORT)
PREFILLED_SYRINGE | INTRAVENOUS | Status: AC
  Filled 2014-06-16: qty 10

## 2014-06-16 MED ORDER — PROPOFOL 10 MG/ML IV BOLUS
INTRAVENOUS | Status: DC | PRN
  Administered 2014-06-16: 40 mg via INTRAVENOUS
  Administered 2014-06-16: 20 mg via INTRAVENOUS
  Administered 2014-06-16: 10 mg via INTRAVENOUS

## 2014-06-16 SURGICAL SUPPLY — 25 items

## 2014-06-16 NOTE — H&P (Signed)
  Problem: Abdominal pain on aspirin and Plavix therapy. Family history of colon cancer. Esophageal dysphagia.  History: The patient is a 64 year old female born 9/to 10/1950. She has peripheral vascular disease complicated by claudication. In 2013, she underwent an aortobifemoral graft surgery which was complicated by intestinal obstruction requiring exploratory laparotomy and adhesion lysis.  The patient has experienced episodic right lower quadrant abdominal discomfort associated with nausea, lightheadedness, and weakness ever since her vascular surgery. The attacks of pain are not provoked by food intake and can develop without any specific precipitating factor. The pain is localized to the right lower quadrant and can last hours in duration reaching an intensity of 6/10. She never has nocturnal attacks of pain. Her episodes of pain are not associated with vomiting, heartburn, or gastrointestinal bleeding. Intermittently she experiences both solid and liquid food esophageal dysphagia without odynophagia. Her bowel function is normal and she denies diarrhea or constipation. She denies a history of peptic ulcer disease. She reports no fever, weight loss, chest pain, shortness of breath, or dysuria.  She has never been screened for hepatitis C  Past medical history: Asthma and a 50-pack-year history of cigarette smoking. Hypercholesterolemia. History of lead poisoning. Peripheral vascular disease with claudication. Carotid artery disease complicated by stroke. Anxiety with depression. Blood transfusion in the past. Migraine headache syndrome. Left subclavian artery disease. Partial small bowel obstruction. Angina pectoris. Uterine cancer. Eyebrow myalgias syndrome. [place] [address] of cocaine and marijuana use. Abdominal hysterectomy. Exploratory laparotomy with adhesion lysis. Left carotid endarterectomy. Cholecystectomy with intraoperative cholangiogram. Right carotid endarterectomy. Aorto bifemoral bypass  graft.  Medication allergies: Hydrocodone, oxycodone, Percocet, and codeine cause nausea  Exam: The patient is alert and lying comfortably on the endoscopy stretcher. Abdomen is soft and nontender to palpation. Lungs are clear to auscultation. Cardiac exam reveals a regular rhythm.  Plan: Proceed with diagnostic esophagogastroduodenoscopy with possible esophageal stricture dilation followed by diagnostic colonoscopy

## 2014-06-16 NOTE — Op Note (Signed)
Problems: Right lower quadrant abdominal pain. Intermittent solid and liquid food esophageal dysphagia. Peripheral vascular disease with claudication. Carotid artery disease. Coronary artery disease with angina..  Endoscopist: Earle Gell  Premedication: Propofol administered by anesthesia  Procedure: Diagnostic esophagogastroduodenoscopy The patient was placed in the left lateral decubitus position. The Pentax gastroscope was passed through the posterior hypopharynx into the proximal esophagus without difficulty. The hypopharynx and larynx appeared normal. I did not visualize the vocal cords.  Esophagoscopy: The proximal, mid, and lower segments of the esophageal mucosa appeared normal. The squamocolumnar junction was noted at approximately 40 cm from the incisor teeth.  Gastroscopy: Retroflex view of the gastric cardia and fundus was normal. The gastric body, antrum, and pylorus appeared normal.  Duodenoscopy: The duodenal bulb and descending duodenum appeared normal  Assessment: Normal esophagogastroduodenoscopy  Procedure: Diagnostic colonoscopy. Anal inspection and digital rectal exam were normal. The Pentax pediatric colonoscope was introduced into the rectum and advanced to the cecum. A normal-appearing appendiceal orifice and ileocecal valve were identified. Colonic preparation for the exam today was good. Withdrawal time was 9 minutes  Rectum. Normal. Retroflexed view of the distal rectum was normal  Sigmoid colon and descending colon. Normal  Splenic flexure. Normal  Transverse colon. Normal  Hepatic flexure. Normal  Ascending colon. Normal  Cecum and ileocecal valve. Normal  Assessment: Normal colonoscopy.  Laboratory data pending: Screen for chronic hepatitis C (hepatitis C antibody)

## 2014-06-16 NOTE — Anesthesia Postprocedure Evaluation (Signed)
  Anesthesia Post-op Note  Patient: Kimberly [name]  Procedure(s) Performed: Procedure(s) (LRB): ESOPHAGOGASTRODUODENOSCOPY (EGD) WITH PROPOFOL (N/A) COLONOSCOPY WITH PROPOFOL (N/A)  Patient Location: PACU  Anesthesia Type: MAC  Level of Consciousness: awake and alert   Airway and Oxygen Therapy: Patient Spontanous Breathing  Post-op Pain: mild  Post-op Assessment: Post-op Vital signs reviewed, Patient's Cardiovascular Status Stable, Respiratory Function Stable, Patent Airway and No signs of Nausea or vomiting  Last Vitals:  Filed Vitals:   06/16/14 1520  BP: 150/78  Pulse: 63  Temp:   Resp: 16    Post-op Vital Signs: stable   Complications: No apparent anesthesia complications

## 2014-06-16 NOTE — Discharge Instructions (Signed)
Esophagogastroduodenoscopy °Care After °Refer to this sheet in the next few weeks. These instructions provide you with information on caring for yourself after your procedure. Your caregiver may also give you more specific instructions. Your treatment has been planned according to current medical practices, but problems sometimes occur. Call your caregiver if you have any problems or questions after your procedure.  °HOME CARE INSTRUCTIONS °· Do not eat or drink anything until the numbing medicine (local anesthetic) has worn off and your gag reflex has returned. You will know that the local anesthetic has worn off when you can swallow comfortably. °· Do not drive for 12 hours after the procedure or as directed by your caregiver. °· Only take medicines as directed by your caregiver. °SEEK MEDICAL CARE IF:  °· You cannot stop coughing. °· You are not urinating at all or less than usual. °SEEK IMMEDIATE MEDICAL CARE IF: °· You have difficulty swallowing. °· You cannot eat or drink. °· You have worsening throat or chest pain. °· You have dizziness, lightheadedness, or you faint. °· You have nausea or vomiting. °· You have chills. °· You have a fever. °· You have severe abdominal pain. °· You have black, tarry, or bloody stools. °Document Released: 12/11/2011 Document Reviewed: 12/11/2011 °Exit[employer]® Patient Information ©2015 [employer]. This information is not intended to replace advice given to you by your health care provider. Make sure you discuss any questions you have with your health care provider. ° °Colonoscopy, Care After °Refer to this sheet in the next few weeks. These instructions provide you with information on caring for yourself after your procedure. Your health care provider may also give you more specific instructions. Your treatment has been planned according to current medical practices, but problems sometimes occur. Call your health care provider if you have any problems or questions after your  procedure. °WHAT TO EXPECT AFTER THE PROCEDURE  °After your procedure, it is typical to have the following: °· A small amount of blood in your stool. °· Moderate amounts of gas and mild abdominal cramping or bloating. °HOME CARE INSTRUCTIONS °· Do not drive, operate machinery, or sign important documents for 24 hours. °· You may shower and resume your regular physical activities, but move at a slower pace for the first 24 hours. °· Take frequent rest periods for the first 24 hours. °· Walk around or put a warm pack on your abdomen to help reduce abdominal cramping and bloating. °· Drink enough fluids to keep your urine clear or pale yellow. °· You may resume your normal diet as instructed by your health care provider. Avoid heavy or fried foods that are hard to digest. °· Avoid drinking alcohol for 24 hours or as instructed by your health care provider. °· Only take over-the-counter or prescription medicines as directed by your health care provider. °· If a tissue sample (biopsy) was taken during your procedure: °¨ Do not take aspirin or blood thinners for 7 days, or as instructed by your health care provider. °¨ Do not drink alcohol for 7 days, or as instructed by your health care provider. °¨ Eat soft foods for the first 24 hours. °SEEK MEDICAL CARE IF: °You have persistent spotting of blood in your stool 2-3 days after the procedure. °SEEK IMMEDIATE MEDICAL CARE IF: °· You have more than a small spotting of blood in your stool. °· You pass large blood clots in your stool. °· Your abdomen is swollen (distended). °· You have nausea or vomiting. °· You have a   fever. °· You have increasing abdominal pain that is not relieved with medicine. °Document Released: 08/08/2003 Document Revised: 10/14/2012 Document Reviewed: 08/31/2012 °[employer]® Patient Information ©2015 [employer]. This information is not intended to replace advice given to you by your health care provider. Make sure you discuss any questions you have  with your health care provider. ° ° °Conscious Sedation, Adult, Care After °Refer to this sheet in the next few weeks. These instructions provide you with information on caring for yourself after your procedure. Your health care provider may also give you more specific instructions. Your treatment has been planned according to current medical practices, but problems sometimes occur. Call your health care provider if you have any problems or questions after your procedure. °WHAT TO EXPECT AFTER THE PROCEDURE  °After your procedure: °· You may feel sleepy, clumsy, and have poor balance for several hours. °· Vomiting may occur if you eat too soon after the procedure. °HOME CARE INSTRUCTIONS °· Do not participate in any activities where you could become injured for at least 24 hours. Do not: °¨ Drive. °¨ Swim. °¨ Ride a bicycle. °¨ Operate heavy machinery. °¨ Cook. °¨ Use power tools. °¨ Climb ladders. °¨ Work from a high place. °· Do not make important decisions or sign legal documents until you are improved. °· If you vomit, drink water, juice, or soup when you can drink without vomiting. Make sure you have little or no nausea before eating solid foods. °· Only take over-the-counter or prescription medicines for pain, discomfort, or fever as directed by your health care provider. °· Make sure you and your family fully understand everything about the medicines given to you, including what side effects may occur. °· You should not drink alcohol, take sleeping pills, or take medicines that cause drowsiness for at least 24 hours. °· If you smoke, do not smoke without supervision. °· If you are feeling better, you may resume normal activities 24 hours after you were sedated. °· Keep all appointments with your health care provider. °SEEK MEDICAL CARE IF: °· Your skin is pale or bluish in color. °· You continue to feel nauseous or vomit. °· Your pain is getting worse and is not helped by medicine. °· You have bleeding or  swelling. °· You are still sleepy or feeling clumsy after 24 hours. °SEEK IMMEDIATE MEDICAL CARE IF: °· You develop a rash. °· You have difficulty breathing. °· You develop any type of allergic problem. °· You have a fever. °MAKE SURE YOU: °· Understand these instructions. °· Will watch your condition. °· Will get help right away if you are not doing well or get worse. °Document Released: 10/14/2012 Document Reviewed: 10/14/2012 °ExitCare® Patient Information ©2015 [employer]. This information is not intended to replace advice given to you by your health care provider. Make sure you discuss any questions you have with your health care provider. ° ° °

## 2014-06-16 NOTE — Transfer of Care (Signed)
Immediate Anesthesia Transfer of Care Note  Patient: Kimberly [name]  Procedure(s) Performed: Procedure(s): ESOPHAGOGASTRODUODENOSCOPY (EGD) WITH PROPOFOL (N/A) COLONOSCOPY WITH PROPOFOL (N/A)  Patient Location: PACU  Anesthesia Type:MAC  Level of Consciousness:  sedated, patient cooperative and responds to stimulation  Airway & Oxygen Therapy:Patient Spontanous Breathing and Patient connected to face mask oxgen  Post-op Assessment:  Report given to PACU RN and Post -op Vital signs reviewed and stable  Post vital signs:  Reviewed and stable  Last Vitals:  Filed Vitals:   06/16/14 1252  BP: 109/35  Pulse: 66  Temp: 36.6 C  Resp: 13    Complications: No apparent anesthesia complications

## 2014-06-16 NOTE — Anesthesia Preprocedure Evaluation (Addendum)
Anesthesia Evaluation  Patient identified by MRN, date of birth, ID band Patient awake    Reviewed: Allergy & Precautions, NPO status , Patient's Chart, lab work & pertinent test results  Airway Mallampati: II  TM Distance: >3 FB Neck ROM: Full    Dental  (+) Poor Dentition, Loose, Dental Advisory Given   Pulmonary shortness of breath, asthma , Current Smoker,  breath sounds clear to auscultation  Pulmonary exam normal       Cardiovascular Exercise Tolerance: Good hypertension, + angina + Peripheral Vascular Disease Normal cardiovascular examRhythm:Regular Rate:Normal     Neuro/Psych  Headaches, PSYCHIATRIC DISORDERS Anxiety Depression  Neuromuscular disease CVA    GI/Hepatic negative GI ROS, Neg liver ROS,   Endo/Other  negative endocrine ROS  Renal/GU negative Renal ROS  negative genitourinary   Musculoskeletal  (+) Fibromyalgia -  Abdominal   Peds negative pediatric ROS (+)  Hematology   Anesthesia Other Findings   Reproductive/Obstetrics negative OB ROS                            Anesthesia Physical Anesthesia Plan  ASA: III  Anesthesia Plan: MAC   Post-op Pain Management:    Induction: Intravenous  Airway Management Planned: Natural Airway  Additional Equipment:   Intra-op Plan:   Post-operative Plan:   Informed Consent: I have reviewed the patients History and Physical, chart, labs and discussed the procedure including the risks, benefits and alternatives for the proposed anesthesia with the patient or authorized representative who has indicated his/her understanding and acceptance.   Dental advisory given  Plan Discussed with: CRNA  Anesthesia Plan Comments:         Anesthesia Quick Evaluation

## 2014-06-17 ENCOUNTER — Encounter (HOSPITAL_COMMUNITY): Payer: Self-pay | Admitting: Gastroenterology

## 2014-06-17 LAB — HEPATITIS C ANTIBODY: HCV Ab: <0.1 s/co ratio (ref 0.0–0.9)

## 2014-09-28 IMAGING — RF DG CHOLANGIOGRAM OPERATIVE
1 series · 6 of 6 positions shown · non-contrast
Comparison: 12/04/2012

CLINICAL DATA: Acute cholecystitis

EXAM:
INTRAOPERATIVE CHOLANGIOGRAM
TECHNIQUE: Cholangiographic images from the C-arm fluoroscopic device were
submitted for interpretation post-operatively. Please see the
procedural report for the amount of contrast and the fluoroscopy
time utilized.

[Series 1: run · 3 acquisitions, 6 frames shown]
[im 1/3]
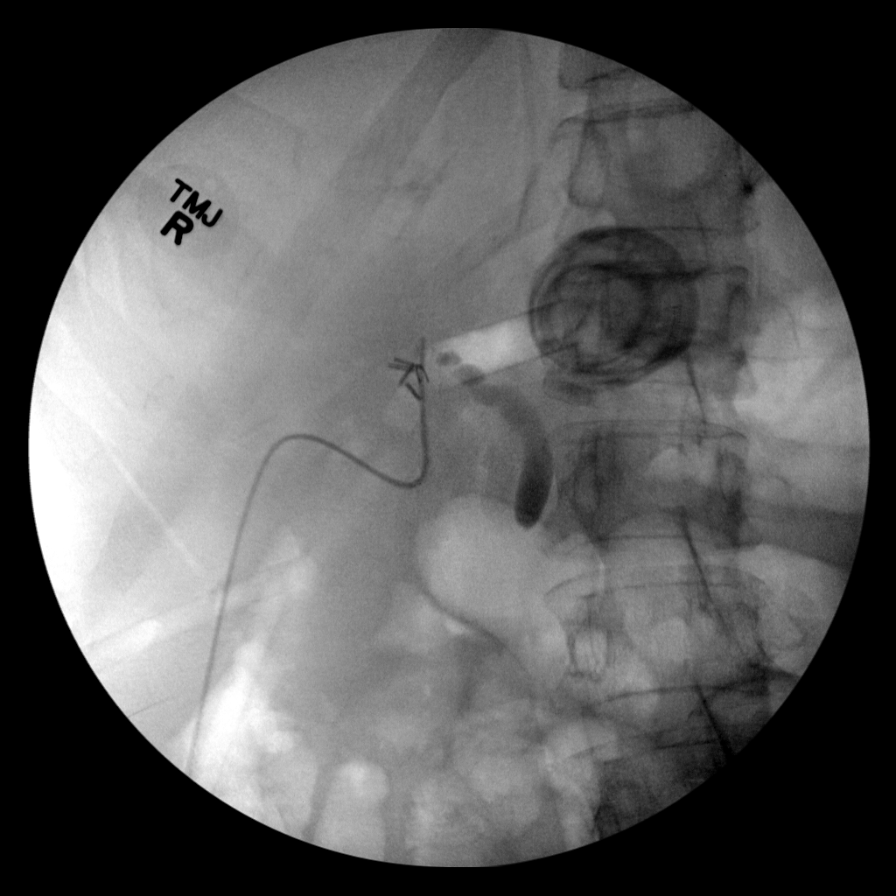
[im 1/3]
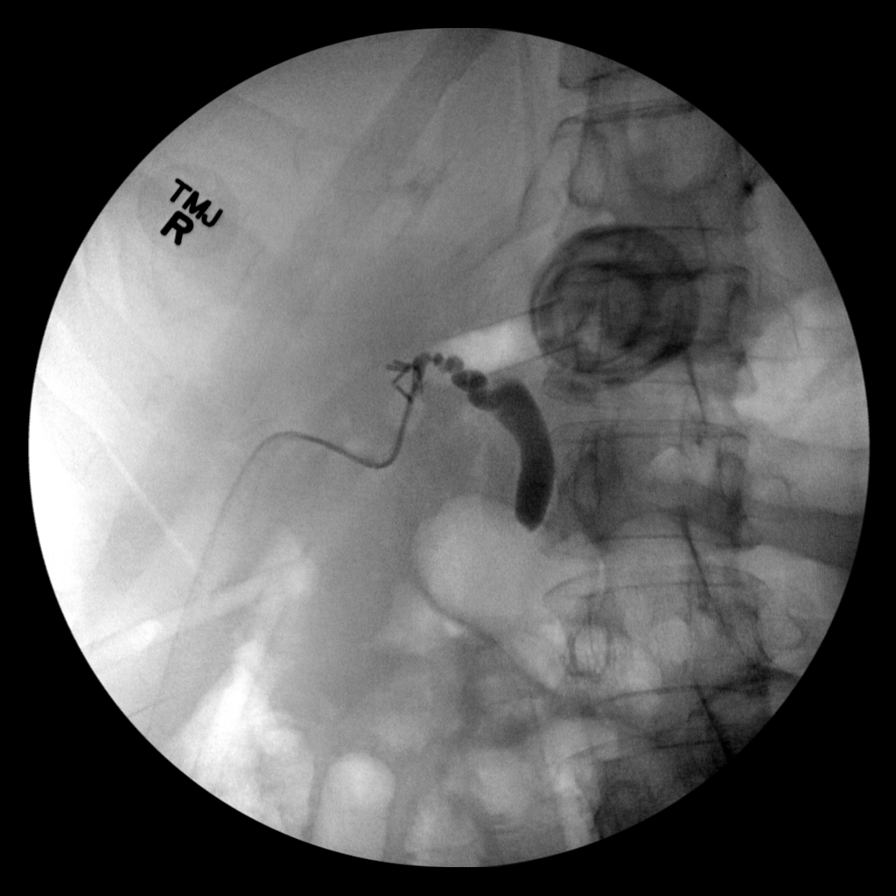
[im 1/3]
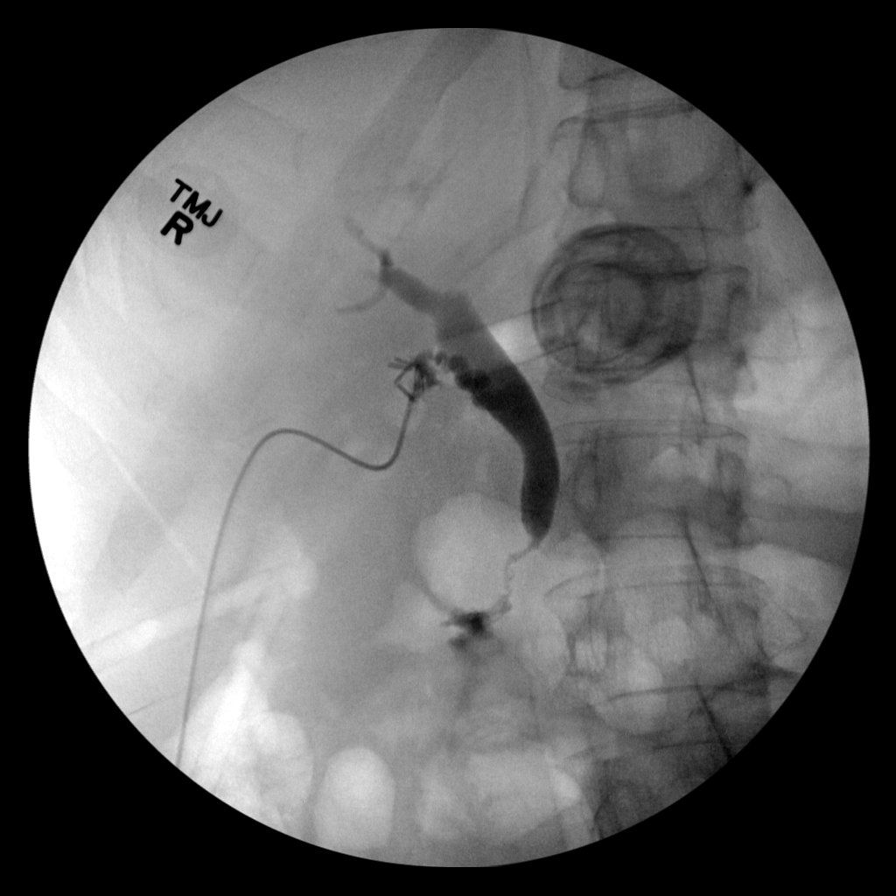
[im 1/3]
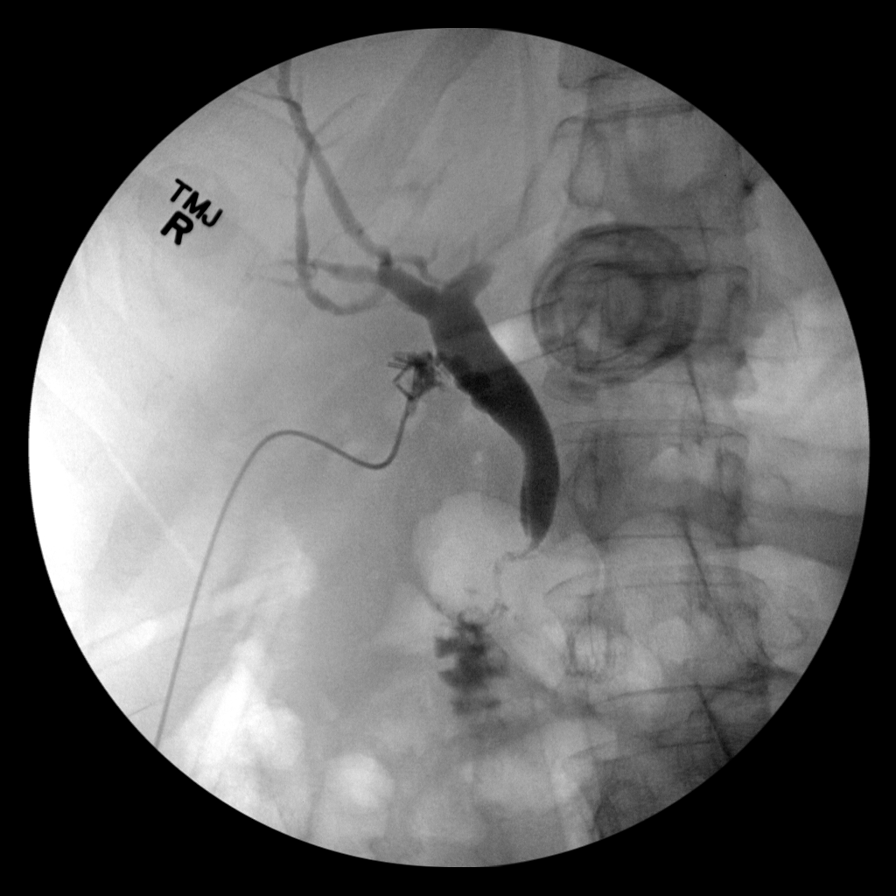
[im 2/3]
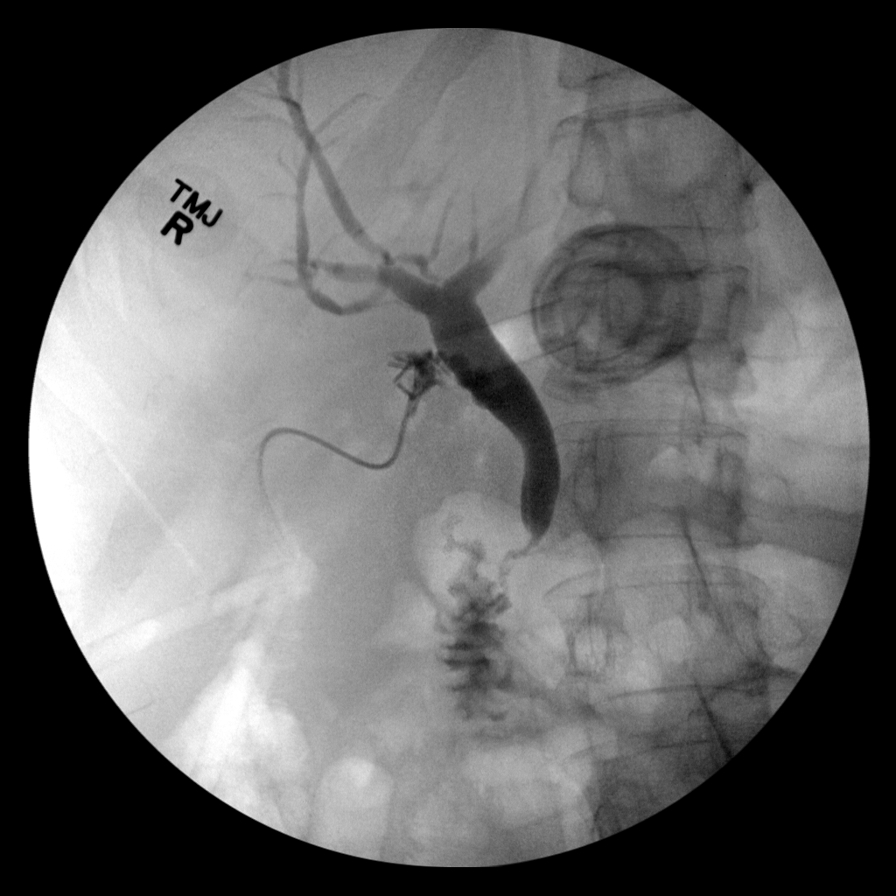
[im 3/3]
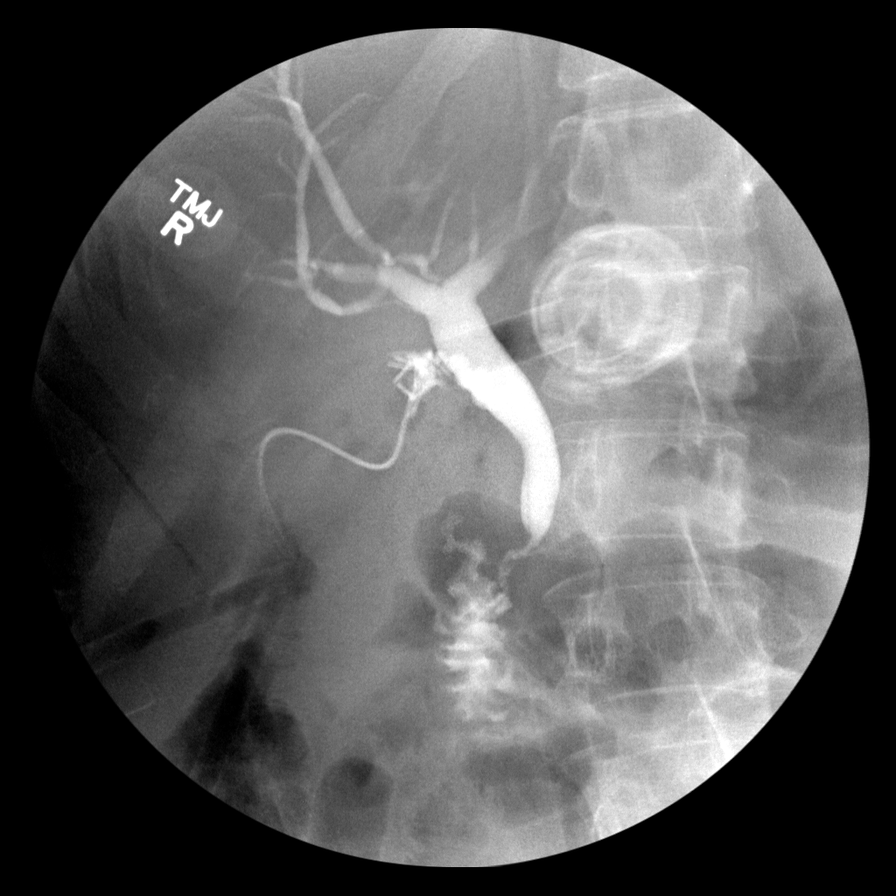

[6 of 6 positions shown; findings below may reference images not displayed]

FINDINGS: Contrast fills the biliary tree and duodenum compatible with
patency. The mid and upper common bile duct is dilated. The distal
common bile duct is narrowed and irregular. Stricture is not
excluded. No filling defects are present to suggest duct stones.
IMPRESSION: Patent biliary system. The distal common bile duct is irregular and
narrowed. Benign or malignant stricture are not excluded.

## 2014-11-20 ENCOUNTER — Emergency Department (HOSPITAL_COMMUNITY): Payer: Medicare Other

## 2014-11-20 ENCOUNTER — Emergency Department (HOSPITAL_COMMUNITY)
Admission: EM | Admit: 2014-11-20 | Discharge: 2014-11-20 | Disposition: A | Discharge status: Home / Self Care | Payer: Medicare Other | Attending: Emergency Medicine | Admitting: Emergency Medicine

## 2014-11-20 ENCOUNTER — Encounter (HOSPITAL_COMMUNITY): Payer: Self-pay

## 2014-11-20 DIAGNOSIS — J45909 Unspecified asthma, uncomplicated: Secondary | ICD-10-CM | POA: Diagnosis not present

## 2014-11-20 DIAGNOSIS — Z8673 Personal history of transient ischemic attack (TIA), and cerebral infarction without residual deficits: Secondary | ICD-10-CM | POA: Diagnosis not present

## 2014-11-20 DIAGNOSIS — Z7902 Long term (current) use of antithrombotics/antiplatelets: Secondary | ICD-10-CM | POA: Insufficient documentation

## 2014-11-20 DIAGNOSIS — R05 Cough: Secondary | ICD-10-CM | POA: Diagnosis not present

## 2014-11-20 DIAGNOSIS — F329 Major depressive disorder, single episode, unspecified: Secondary | ICD-10-CM | POA: Insufficient documentation

## 2014-11-20 DIAGNOSIS — Z8719 Personal history of other diseases of the digestive system: Secondary | ICD-10-CM | POA: Diagnosis not present

## 2014-11-20 DIAGNOSIS — Z8542 Personal history of malignant neoplasm of other parts of uterus: Secondary | ICD-10-CM | POA: Diagnosis not present

## 2014-11-20 DIAGNOSIS — F419 Anxiety disorder, unspecified: Secondary | ICD-10-CM | POA: Diagnosis not present

## 2014-11-20 DIAGNOSIS — Z8639 Personal history of other endocrine, nutritional and metabolic disease: Secondary | ICD-10-CM | POA: Diagnosis not present

## 2014-11-20 DIAGNOSIS — R079 Chest pain, unspecified: Secondary | ICD-10-CM

## 2014-11-20 DIAGNOSIS — I10 Essential (primary) hypertension: Secondary | ICD-10-CM | POA: Diagnosis not present

## 2014-11-20 DIAGNOSIS — Z862 Personal history of diseases of the blood and blood-forming organs and certain disorders involving the immune mechanism: Secondary | ICD-10-CM | POA: Diagnosis not present

## 2014-11-20 DIAGNOSIS — F17210 Nicotine dependence, cigarettes, uncomplicated: Secondary | ICD-10-CM | POA: Insufficient documentation

## 2014-11-20 DIAGNOSIS — R059 Cough, unspecified: Secondary | ICD-10-CM

## 2014-11-20 DIAGNOSIS — Z7982 Long term (current) use of aspirin: Secondary | ICD-10-CM | POA: Diagnosis not present

## 2014-11-20 LAB — BASIC METABOLIC PANEL
Anion gap: 7 (ref 5–15)
BUN: 13 mg/dL (ref 6–20)
CO2: 31 mmol/L (ref 22–32)
Calcium: 9.6 mg/dL (ref 8.9–10.3)
Chloride: 103 mmol/L (ref 101–111)
Creatinine, Ser: 0.88 mg/dL (ref 0.44–1.00)
GFR calc Af Amer: >60 mL/min (ref >60)
GFR calc non Af Amer: >60 mL/min (ref >60)
Glucose, Bld: 62 mg/dL — ABNORMAL LOW (ref 65–99)
Potassium: 4.3 mmol/L (ref 3.5–5.1)
Sodium: 141 mmol/L (ref 135–145)

## 2014-11-20 LAB — CBC
HCT: 45.1 % (ref 36.0–46.0)
Hemoglobin: 15.4 g/dL — ABNORMAL HIGH (ref 12.0–15.0)
MCH: 33.6 pg (ref 26.0–34.0)
MCHC: 34.1 g/dL (ref 30.0–36.0)
MCV: 98.5 fL (ref 78.0–100.0)
Platelets: 275 10*3/uL (ref 150–400)
RBC: 4.58 MIL/uL (ref 3.87–5.11)
RDW: 12.8 % (ref 11.5–15.5)
WBC: 12.1 10*3/uL — ABNORMAL HIGH (ref 4.0–10.5)

## 2014-11-20 LAB — I-STAT TROPONIN, ED: Troponin i, poc: 0 ng/mL (ref 0.00–0.08)

## 2014-11-20 LAB — TROPONIN I: Troponin I: 0.03 ng/mL (ref <0.031)

## 2014-11-20 MED ORDER — METHYLPREDNISOLONE SODIUM SUCC 125 MG IJ SOLR
125.0000 mg | Freq: Once | INTRAMUSCULAR | Status: AC
  Administered 2014-11-20: 125 mg via INTRAVENOUS
  Filled 2014-11-20: qty 2

## 2014-11-20 MED ORDER — PREDNISONE 20 MG PO TABS: 40.0000 mg | ORAL_TABLET | Freq: Every day | ORAL | Status: AC

## 2014-11-20 MED ORDER — SODIUM CHLORIDE 0.9 % IV BOLUS (SEPSIS)
500.0000 mL | Freq: Once | INTRAVENOUS | Status: AC
  Administered 2014-11-20: 500 mL via INTRAVENOUS

## 2014-11-20 MED ORDER — IOHEXOL 350 MG/ML SOLN
100.0000 mL | Freq: Once | INTRAVENOUS | Status: AC | PRN
  Administered 2014-11-20: 100 mL via INTRAVENOUS

## 2014-11-20 MED ORDER — SODIUM CHLORIDE 0.9 % IV SOLN
INTRAVENOUS | Status: DC
  Administered 2014-11-20: 125 mL/h via INTRAVENOUS

## 2014-11-20 NOTE — ED Notes (Signed)
Patient returned from CT

## 2014-11-20 NOTE — ED Provider Notes (Signed)
CSN: [id number]     Arrival date & time 11/20/14  1450 History   First MD Initiated Contact with Patient 11/20/14 1624     Chief Complaint  Patient presents with  . Chest Pain    HPI  Patient presents with concern of chest pain, left arm numbness Pain began today, without clear precipitant. Since onset has been waxing, waning, with intermittent exacerbations, but no clear precipitating, alleviating, exacerbating factors. Patient has not been particularly active but it does not sound as though the pain is exertional, nor pleuritic. No lightheadedness, no syncope, no nausea, no vomiting. Patient states that the pain is similar to that she experienced previously with vascular occlusion. Patient has history of distal aorto bifemoral stent, as well as left subclavian stent.   Past Medical History  Diagnosis Date  . Asthma   . Hyperlipidemia   . History of lead poisoning 1977  . Shortness of breath   . Peripheral vascular disease (HCC)     claudication  right greater than left  femoral artery  . Anxiety   . Blood transfusion   . Migraine   . Subclavian artery disease ([place])     left  . Small bowel obstruction, partial ([place]) 04/08/11  . Angina     March 2013  . Depression   . Uterine cancer (HCC)     Pre cervical  . Fibromyalgia   . Anemia   . Stroke ([place])   . Hypertension    Past Surgical History  Procedure Laterality Date  . Abdominal angiogram    . Aorta - bilateral femoral artery bypass graft  02/26/2011    Procedure: AORTA BIFEMORAL BYPASS GRAFT;  Surgeon: Theotis Burrow, MD;  Location: [facility] OR;  Service: Vascular;  Laterality: N/A;  . Pr vein bypass graft,aorto-fem-pop  02/26/11  . Laparotomy  04/10/2011    Procedure: EXPLORATORY LAPAROTOMY;  Surgeon: Shann Medal, MD;  Location: [facility];  Service: General;  Laterality: N/A;  ENTEROLYSIS OF ADHESIONS  . Endarterectomy  06/13/2011    Procedure: ENDARTERECTOMY CAROTID;  Surgeon: Serafina Mitchell, MD;  Location: [facility] OR;  Service:  Vascular;  Laterality: Left;  Left carotid artery endarterectomy with vascu-guard patch angioplasty  . Lower extremity angiogram Left 06/24/12    Left stent intervention, left subclavian  . Cholecystectomy N/A 12/08/2012    Procedure: LAPAROSCOPIC CHOLECYSTECTOMY WITH INTRAOPERATIVE CHOLANGIOGRAM;  Surgeon: Haywood Lasso, MD;  Location: [facility];  Service: General;  Laterality: N/A;  . Abdominal hysterectomy  2008  . Endarterectomy Right 08/13/2013    Procedure: RIGHT  CAROTID ENDARTERECTOMY CAROTID WITH BOVINE PERICARDIAL PATCH ANGIOPLASTY;  Surgeon: Serafina Mitchell, MD;  Location: [facility] OR;  Service: Vascular;  Laterality: Right;  . Carotid endarterectomy Left 06/13/2011    Left  CEA  . Carotid endarterectomy Right 08-13-13    CEA  . Abdominal angiogram N/A 02/05/2011    Procedure: ABDOMINAL ANGIOGRAM;  Surgeon: Serafina Mitchell, MD;  Location: [facility] CATH LAB;  Service: Cardiovascular;  Laterality: N/A;  . Arch aortogram N/A 06/24/2012    Procedure: ARCH AORTOGRAM;  Surgeon: Serafina Mitchell, MD;  Location: [facility] CATH LAB;  Service: Cardiovascular;  Laterality: N/A;  . Bilateral upper extremity angiogram Left 08/12/2013    Procedure: UPPER EXTREMITY ANGIOGRAM;  Surgeon: Serafina Mitchell, MD;  Location: [facility] CATH LAB;  Service: Cardiovascular;  Laterality: Left;  . Esophagogastroduodenoscopy (egd) with propofol N/A 06/16/2014    Procedure: ESOPHAGOGASTRODUODENOSCOPY (EGD) WITH PROPOFOL;  Surgeon: Garlan Fair, MD;  Location:  [facility] ENDOSCOPY;  Service: Endoscopy;  Laterality: N/A;  . Colonoscopy with propofol N/A 06/16/2014    Procedure: COLONOSCOPY WITH PROPOFOL;  Surgeon: Garlan Fair, MD;  Location: [facility] ENDOSCOPY;  Service: Endoscopy;  Laterality: N/A;   Family History  Problem Relation Age of Onset  . Cancer Mother     breast, colon, ovarian  . Anesthesia problems Neg Hx   . Cancer Brother 50    lung  . Hyperlipidemia Brother   . Cancer Other   . Stroke Other   . Cancer Maternal Grandmother     ovarian    Social History  Substance Use Topics  . Smoking status: Current Some Day Smoker -- 1.00 packs/day for 45 years    Types: Cigarettes  . Smokeless tobacco: Never Used     Comment: pt states that she smokes about 6 cigs per day  . Alcohol Use: No   OB History    No data available     Review of Systems  Constitutional:       Per HPI, otherwise negative  HENT:       Per HPI, otherwise negative  Respiratory:       Per HPI, otherwise negative  Cardiovascular:       Per HPI, otherwise negative  Gastrointestinal: Negative for vomiting.  Endocrine:       Negative aside from HPI  Genitourinary:       Neg aside from HPI   Musculoskeletal:       Per HPI, otherwise negative  Skin: Negative.   Neurological: Negative for syncope.      Allergies  Hydrocodone; Other; Oxycodone; Percocet; and Codeine  Home Medications   Prior to Admission medications   Medication Sig Start Date End Date Taking? Authorizing Provider  acetaminophen (TYLENOL) 325 MG tablet Take 650 mg by mouth every 6 (six) hours as needed for pain.    Historical Provider, MD  aspirin 325 MG tablet Take 325 mg by mouth daily.    Historical Provider, MD  clopidogrel (PLAVIX) 75 MG tablet Take 1 tablet (75 mg total) by mouth daily. Patient taking differently: Take 75 mg by mouth daily. At 0800 02/07/14   Serafina Mitchell, MD  ibuprofen (ADVIL,MOTRIN) 200 MG tablet Take 400-600 mg by mouth every 6 (six) hours as needed for pain.     Historical Provider, MD  nicotine (NICODERM CQ - DOSED IN MG/24 HOURS) 14 mg/24hr patch Place 1 patch (14 mg total) onto the skin daily. Patient not taking: Reported on 06/10/2014 04/20/14   Lance Bosch, NP  polyethylene glycol (MIRALAX / GLYCOLAX) packet Take 17 g by mouth daily as needed (constipation).    Historical Provider, MD  traMADol (ULTRAM) 50 MG tablet Take 50 mg by mouth every 6 (six) hours as needed for severe pain.    Historical Provider, MD   BP 93/51 mmHg  Pulse 67  Temp(Src)  98.4 F (36.9 C) (Oral)  Resp 15  Ht 5\' 4"  (1.626 m)  Wt 145 lb 12.8 oz (66.134 kg)  BMI 25.01 kg/m2  SpO2 99% Physical Exam  Constitutional: She is oriented to person, place, and time. She appears well-developed and well-nourished. No distress.  HENT:  Head: Normocephalic and atraumatic.  Eyes: Conjunctivae and EOM are normal.  Cardiovascular: Normal rate and regular rhythm.   Pulmonary/Chest: Effort normal and breath sounds normal. No stridor. No respiratory distress.  Abdominal: She exhibits no distension.  Musculoskeletal: She exhibits no edema.  Neurological: She is alert and oriented  to person, place, and time. She displays no atrophy and no tremor. No cranial nerve deficit. She exhibits abnormal muscle tone. She displays no seizure activity.  Strength slightly diminished left versus right  Skin: Skin is warm and dry.  Psychiatric: She has a normal mood and affect.  Nursing note and vitals reviewed.   ED Course  Procedures (including critical care time) Labs Review Labs Reviewed  BASIC METABOLIC PANEL - Abnormal; Notable for the following:    Glucose, Bld 62 (*)    All other components within normal limits  CBC - Abnormal; Notable for the following:    WBC 12.1 (*)    Hemoglobin 15.4 (*)    All other components within normal limits  I-STAT TROPOININ, ED    Imaging Review Dg Chest 2 View  11/20/2014  CLINICAL DATA:  Chest pain radiating to back, right-sided chest pain. Smoking history. EXAM: CHEST  2 VIEW COMPARISON:  Chest x-ray dated 04/27/2014. FINDINGS: Heart size is normal. Overall cardiomediastinal silhouette is stable in size and configuration. Left subclavian artery stent appears stable in position. Clips again noted within the bilateral neck soft tissues presumably related to previous carotid endarterectomies. Lungs are clear. No evidence of pneumonia. No pleural effusion. No acute/significant osseous abnormality seen. IMPRESSION: Stable chest x-ray. No evidence  of acute cardiopulmonary abnormality. Electronically Signed   By: Franki Cabot M.D.   On: 11/20/2014 15:59   Ct Angio Chest Aorta W/cm &/or Wo/cm  11/20/2014  CLINICAL DATA:  Left chest and left arm pain. History of asthma. Stroke. Asymmetric blood pressure. Query subclavian steal. EXAM: CT ANGIOGRAPHY CHEST WITH CONTRAST TECHNIQUE: Multidetector CT imaging of the chest was performed using the standard protocol during bolus administration of intravenous contrast. Multiplanar CT image reconstructions and MIPs were obtained to evaluate the vascular anatomy. CONTRAST:  110mL OMNIPAQUE IOHEXOL 350 MG/ML SOLN COMPARISON:  12/04/2012 FINDINGS: Mediastinum/Nodes: There is a stent proximally in the left subclavian artery. The stent is patent and the subclavian artery and axillary artery appear patent. There is some atheromatous narrowing at the origin of the right subclavian artery but general patency is observed. Vertebral arteries unremarkable. Mild intimal thickening medially in the left proximal common carotid artery without any critical stenosis. Atherosclerotic calcification in the aortic arch without aortic dissection. No filling defect is identified in the pulmonary arterial tree to suggest pulmonary embolus. No high density along the aortic or branch vessel walls to suggest acute intramural hematoma. No pathologic thoracic adenopathy. Lungs/Pleura: Airway thickening is present, suggesting bronchitis or reactive airways disease. Upper abdomen: Narrow origin of the celiac trunk, images 112-113 of series 603. Musculoskeletal: Unremarkable Review of the MIP images confirms the above findings. IMPRESSION: 1. No acute vascular abnormalities. Patent stent proximally in the left subclavian artery without critical stenosis to explain the asymmetry of blood pressure. 2. Airway thickening is present, suggesting bronchitis or reactive airways disease. 3. There is some atherosclerotic narrowing of the origin of the celiac  trunk in the upper abdomen. Electronically Signed   By: Van Clines M.D.   On: 11/20/2014 18:02   I have personally reviewed and evaluated these images and lab results as part of my medical decision-making.   EKG Interpretation   Date/Time:  Sunday November 20 2014 15:03:57 EST Ventricular Rate:  72 PR Interval:  118 QRS Duration: 88 QT Interval:  362 QTC Calculation: 396 R Axis:   50 Text Interpretation:  Normal sinus rhythm Normal ECG Sinus rhythm Artifact  Abnormal ekg Confirmed by Carmin Muskrat  MD [phone]) on 11/20/2014  4:27:42 PM     Blood pressure is symmetric, 95/50 right, 150/75   6:29 PM Patient sleeping   11:12 PM Troponin I No. 2 unremarkable Patient sleeping. Blood pressure symmetric in both arms now.  MDM  Patient presents with concern of chest pain, arm pain. Patient has mild cough, mild dyspnea. Patient's evaluation here is largely reassuring, no evidence for ongoing coronary ischemia, no evidence for new arterial occlusion. Patient was sleeping on multiple repeat evaluations. She does describe ongoing mild cough, and there is some suspicion for bronchitis. Patient discharged in stable condition to follow-up with primary care.   Carmin Muskrat, MD 11/20/14 [phone]

## 2014-11-20 NOTE — ED Notes (Signed)
MD Lockwood at the bedside  

## 2014-11-20 NOTE — ED Notes (Signed)
Pt reports she was out walking this morning and started having left sided chest and left arm pain.  No shortness of breath.  Pain is sharp and intermittant.

## 2014-11-20 NOTE — ED Notes (Signed)
ekg delay pt using restrrom.

## 2014-11-20 NOTE — Discharge Instructions (Signed)
As discussed, your evaluation today has been largely reassuring.  But, it is important that you monitor your condition carefully, and do not hesitate to return to the ED if you develop new, or concerning changes in your condition.  Your symptoms may be due to mild bronchitis or chest wall inflammation.  Please take all medication as directed.  Please follow-up with your physician for appropriate ongoing care.

## 2015-02-06 ENCOUNTER — Other Ambulatory Visit: Payer: Self-pay | Admitting: *Deleted

## 2015-02-06 DIAGNOSIS — I6529 Occlusion and stenosis of unspecified carotid artery: Secondary | ICD-10-CM

## 2015-02-06 MED ORDER — CLOPIDOGREL BISULFATE 75 MG PO TABS: 75.0000 mg | ORAL_TABLET | Freq: Every day | ORAL | Status: DC

## 2015-03-16 ENCOUNTER — Emergency Department (HOSPITAL_COMMUNITY)
Admission: EM | Admit: 2015-03-16 | Discharge: 2015-03-16 | Disposition: A | Discharge status: Home / Self Care | Payer: Medicare Other | Attending: Emergency Medicine | Admitting: Emergency Medicine

## 2015-03-16 ENCOUNTER — Emergency Department (HOSPITAL_COMMUNITY): Payer: Medicare Other

## 2015-03-16 DIAGNOSIS — I209 Angina pectoris, unspecified: Secondary | ICD-10-CM | POA: Insufficient documentation

## 2015-03-16 DIAGNOSIS — F17210 Nicotine dependence, cigarettes, uncomplicated: Secondary | ICD-10-CM | POA: Insufficient documentation

## 2015-03-16 DIAGNOSIS — I10 Essential (primary) hypertension: Secondary | ICD-10-CM | POA: Diagnosis not present

## 2015-03-16 DIAGNOSIS — Z8719 Personal history of other diseases of the digestive system: Secondary | ICD-10-CM | POA: Diagnosis not present

## 2015-03-16 DIAGNOSIS — R05 Cough: Secondary | ICD-10-CM | POA: Diagnosis not present

## 2015-03-16 DIAGNOSIS — F329 Major depressive disorder, single episode, unspecified: Secondary | ICD-10-CM | POA: Insufficient documentation

## 2015-03-16 DIAGNOSIS — R059 Cough, unspecified: Secondary | ICD-10-CM

## 2015-03-16 DIAGNOSIS — M797 Fibromyalgia: Secondary | ICD-10-CM | POA: Diagnosis not present

## 2015-03-16 DIAGNOSIS — Z8639 Personal history of other endocrine, nutritional and metabolic disease: Secondary | ICD-10-CM | POA: Diagnosis not present

## 2015-03-16 DIAGNOSIS — Z8673 Personal history of transient ischemic attack (TIA), and cerebral infarction without residual deficits: Secondary | ICD-10-CM | POA: Diagnosis not present

## 2015-03-16 DIAGNOSIS — Z7982 Long term (current) use of aspirin: Secondary | ICD-10-CM | POA: Diagnosis not present

## 2015-03-16 DIAGNOSIS — M791 Myalgia: Secondary | ICD-10-CM | POA: Diagnosis not present

## 2015-03-16 DIAGNOSIS — J45901 Unspecified asthma with (acute) exacerbation: Secondary | ICD-10-CM | POA: Diagnosis not present

## 2015-03-16 DIAGNOSIS — F419 Anxiety disorder, unspecified: Secondary | ICD-10-CM | POA: Insufficient documentation

## 2015-03-16 DIAGNOSIS — M25511 Pain in right shoulder: Secondary | ICD-10-CM | POA: Insufficient documentation

## 2015-03-16 DIAGNOSIS — R079 Chest pain, unspecified: Secondary | ICD-10-CM | POA: Diagnosis not present

## 2015-03-16 DIAGNOSIS — Z862 Personal history of diseases of the blood and blood-forming organs and certain disorders involving the immune mechanism: Secondary | ICD-10-CM | POA: Diagnosis not present

## 2015-03-16 DIAGNOSIS — M7918 Myalgia, other site: Secondary | ICD-10-CM

## 2015-03-16 DIAGNOSIS — Z7902 Long term (current) use of antithrombotics/antiplatelets: Secondary | ICD-10-CM | POA: Diagnosis not present

## 2015-03-16 DIAGNOSIS — Z8541 Personal history of malignant neoplasm of cervix uteri: Secondary | ICD-10-CM | POA: Diagnosis not present

## 2015-03-16 LAB — CBC
HCT: 44.2 % (ref 36.0–46.0)
Hemoglobin: 15.1 g/dL — ABNORMAL HIGH (ref 12.0–15.0)
MCH: 33.1 pg (ref 26.0–34.0)
MCHC: 34.2 g/dL (ref 30.0–36.0)
MCV: 96.9 fL (ref 78.0–100.0)
Platelets: 166 10*3/uL (ref 150–400)
RBC: 4.56 MIL/uL (ref 3.87–5.11)
RDW: 12.8 % (ref 11.5–15.5)
WBC: 6.7 10*3/uL (ref 4.0–10.5)

## 2015-03-16 LAB — BASIC METABOLIC PANEL
Anion gap: 12 (ref 5–15)
BUN: 13 mg/dL (ref 6–20)
CO2: 24 mmol/L (ref 22–32)
Calcium: 8.7 mg/dL — ABNORMAL LOW (ref 8.9–10.3)
Chloride: 103 mmol/L (ref 101–111)
Creatinine, Ser: 0.97 mg/dL (ref 0.44–1.00)
GFR calc Af Amer: >60 mL/min (ref >60)
GFR calc non Af Amer: >60 mL/min (ref >60)
Glucose, Bld: 99 mg/dL (ref 65–99)
Potassium: 3.9 mmol/L (ref 3.5–5.1)
Sodium: 139 mmol/L (ref 135–145)

## 2015-03-16 LAB — I-STAT TROPONIN, ED: Troponin i, poc: 0 ng/mL (ref 0.00–0.08)

## 2015-03-16 MED ORDER — BENZONATATE 100 MG PO CAPS
100.0000 mg | ORAL_CAPSULE | Freq: Once | ORAL | Status: AC
  Administered 2015-03-16: 100 mg via ORAL
  Filled 2015-03-16: qty 1

## 2015-03-16 MED ORDER — BENZONATATE 100 MG PO CAPS: 100.0000 mg | ORAL_CAPSULE | Freq: Three times a day (TID) | ORAL | Status: DC

## 2015-03-16 MED ORDER — ALBUTEROL SULFATE HFA 108 (90 BASE) MCG/ACT IN AERS
2.0000 | INHALATION_SPRAY | RESPIRATORY_TRACT | Status: DC | PRN
  Filled 2015-03-16: qty 6.7

## 2015-03-16 MED ORDER — TRAMADOL HCL 50 MG PO TABS
50.0000 mg | ORAL_TABLET | Freq: Once | ORAL | Status: AC
  Administered 2015-03-16: 50 mg via ORAL
  Filled 2015-03-16: qty 1

## 2015-03-16 MED ORDER — HYDROCODONE-ACETAMINOPHEN 5-325 MG PO TABS
1.0000 | ORAL_TABLET | Freq: Once | ORAL | Status: DC
  Filled 2015-03-16: qty 1

## 2015-03-16 MED ORDER — AZITHROMYCIN 250 MG PO TABS: ORAL_TABLET | ORAL | Status: DC

## 2015-03-16 MED ORDER — IPRATROPIUM BROMIDE 0.02 % IN SOLN
0.5000 mg | Freq: Once | RESPIRATORY_TRACT | Status: AC
  Administered 2015-03-16: 0.5 mg via RESPIRATORY_TRACT
  Filled 2015-03-16: qty 2.5

## 2015-03-16 MED ORDER — ALBUTEROL SULFATE (2.5 MG/3ML) 0.083% IN NEBU
5.0000 mg | INHALATION_SOLUTION | Freq: Once | RESPIRATORY_TRACT | Status: AC
  Administered 2015-03-16: 5 mg via RESPIRATORY_TRACT
  Filled 2015-03-16: qty 6

## 2015-03-16 NOTE — ED Notes (Signed)
During discharge instructions, pt stated that she did not agree to an EKG, and wanted it to be documented.

## 2015-03-16 NOTE — ED Notes (Signed)
Pt c/o productive cough x 3 mth with white sputum, pt c/o mid CP & R shoulder pain, pt c/o SOB, pt denies n/v, pt reports diarrhea x 3 in the last 24  Hrs, pt A&O x4

## 2015-03-16 NOTE — Discharge Instructions (Signed)
Please read and follow all provided instructions.  Your diagnoses today include:  1. Cough   2. Musculoskeletal pain     Tests performed today include:  Chest x-ray - does not show any pneumonia  Blood counts and electrolytes - normal  Vital signs. See below for your results today.   Medications prescribed:   Albuterol inhaler - medication that opens up your airway  Use inhaler as follows: 1-2 puffs with spacer every 4 hours as needed for wheezing, cough, or shortness of breath.    Azithromycin - antibiotic for respiratory infection  You have been prescribed an antibiotic medicine: take the entire course of medicine even if you are feeling better. Stopping early can cause the antibiotic not to work.   Tessalon Perles - cough suppressant medication  Take any prescribed medications only as directed.  Home care instructions:  Follow any educational materials contained in this packet.  Follow-up instructions: Please follow-up with your primary care provider in the next 3 days for further evaluation of your symptoms and a recheck if you are not feeling better.   Return instructions:   Please return to the Emergency Department if you experience worsening symptoms.  Please return with worsening wheezing, shortness of breath, or difficulty breathing.  Return with persistent fever above 101F.   Please return if you have any other emergent concerns.  Additional Information:  Your vital signs today were: BP 112/81 mmHg   Pulse 80   Temp(Src) 98.5 F (36.9 C) (Oral)   Resp 23   Wt 63.776 kg   SpO2 99% If your blood pressure (BP) was elevated above 135/85 this visit, please have this repeated by your doctor within one month. --------------

## 2015-03-16 NOTE — ED Provider Notes (Signed)
CSN: [id number]     Arrival date & time 03/16/15  1352 History   First MD Initiated Contact with Patient 03/16/15 2038     Chief Complaint  Patient presents with  . Cough  . Shoulder Pain   (Consider location/radiation/quality/duration/timing/severity/associated sxs/prior Treatment) HPI Comments: Patient with history of peripheral arterial disease, multiple interventions, on Plavix -- presents today with complaint of cough 3 months along with mid chest pain and right shoulder pain that has been constant and worse with coughing and movement. Patient denies shortness of breath, diaphoresis, vomiting, radiation of pain. Patient denies risk factors for pulmonary embolism including: unilateral leg swelling, history of DVT/PE/other blood clots, use of exogenous hormones, recent immobilizations, recent surgery, recent travel (>4hr segment), malignancy, hemoptysis. Cough is productive of white sputum. No abdominal pain. Patient has had 3 episodes of diarrhea in the past day without blood. No urinary symptoms. Patient has been taking over-the-counter allergy medications without relief. The onset of this condition was acute. The course is constant.      Patient is a 65 y.o. female presenting with cough and shoulder pain. The history is provided by the patient and medical records.  Cough Associated symptoms: chest pain, myalgias and wheezing   Associated symptoms: no fever, no headaches, no rash, no rhinorrhea, no shortness of breath and no sore throat   Shoulder Pain Associated symptoms: no fever     Past Medical History  Diagnosis Date  . Asthma   . Hyperlipidemia   . History of lead poisoning 1977  . Shortness of breath   . Peripheral vascular disease (HCC)     claudication  right greater than left  femoral artery  . Anxiety   . Blood transfusion   . Migraine   . Subclavian artery disease ([place])     left  . Small bowel obstruction, partial ([place]) 04/08/11  . Angina     March 2013  .  Depression   . Uterine cancer (HCC)     Pre cervical  . Fibromyalgia   . Anemia   . Stroke ([place])   . Hypertension    Past Surgical History  Procedure Laterality Date  . Abdominal angiogram    . Aorta - bilateral femoral artery bypass graft  02/26/2011    Procedure: AORTA BIFEMORAL BYPASS GRAFT;  Surgeon: Theotis Burrow, MD;  Location: [place] OR;  Service: Vascular;  Laterality: N/A;  . Pr vein bypass graft,aorto-fem-pop  02/26/11  . Laparotomy  04/10/2011    Procedure: EXPLORATORY LAPAROTOMY;  Surgeon: Shann Medal, MD;  Location: [place];  Service: General;  Laterality: N/A;  ENTEROLYSIS OF ADHESIONS  . Endarterectomy  06/13/2011    Procedure: ENDARTERECTOMY CAROTID;  Surgeon: Serafina Mitchell, MD;  [facility] OR;  Service: Vascular;  Laterality: Left;  Left carotid artery endarterectomy with vascu-guard patch angioplasty  . Lower extremity angiogram Left 06/24/12    Left stent intervention, left subclavian  . Cholecystectomy N/A 12/08/2012    Procedure: LAPAROSCOPIC CHOLECYSTECTOMY WITH INTRAOPERATIVE CHOLANGIOGRAM;  Surgeon: Haywood Lasso, MD;  Location: [place];  Service: General;  Laterality: N/A;  . Abdominal hysterectomy  2008  . Endarterectomy Right 08/13/2013    Procedure: RIGHT  CAROTID ENDARTERECTOMY CAROTID WITH BOVINE PERICARDIAL PATCH ANGIOPLASTY;  Surgeon: Serafina Mitchell, MD;  Location: [place] OR;  Service: Vascular;  Laterality: Right;  . Carotid endarterectomy Left 06/13/2011    Left  CEA  . Carotid endarterectomy Right 08-13-13    CEA  . Abdominal angiogram N/A 02/05/2011  Procedure: ABDOMINAL ANGIOGRAM;  Surgeon: Serafina Mitchell, MD;  Location: [facility] CATH LAB;  Service: Cardiovascular;  Laterality: N/A;  . Arch aortogram N/A 06/24/2012    Procedure: ARCH AORTOGRAM;  Surgeon: Serafina Mitchell, MD;  Location: [facility] CATH LAB;  Service: Cardiovascular;  Laterality: N/A;  . Bilateral upper extremity angiogram Left 08/12/2013    Procedure: UPPER EXTREMITY ANGIOGRAM;  Surgeon: Serafina Mitchell,  MD;  Location: [facility] CATH LAB;  Service: Cardiovascular;  Laterality: Left;  . Esophagogastroduodenoscopy (egd) with propofol N/A 06/16/2014    Procedure: ESOPHAGOGASTRODUODENOSCOPY (EGD) WITH PROPOFOL;  Surgeon: Garlan Fair, MD;  Location: [facility] ENDOSCOPY;  Service: Endoscopy;  Laterality: N/A;  . Colonoscopy with propofol N/A 06/16/2014    Procedure: COLONOSCOPY WITH PROPOFOL;  Surgeon: Garlan Fair, MD;  Location: [facility] ENDOSCOPY;  Service: Endoscopy;  Laterality: N/A;   Family History  Problem Relation Age of Onset  . Cancer Mother     breast, colon, ovarian  . Anesthesia problems Neg Hx   . Cancer Brother 50    lung  . Hyperlipidemia Brother   . Cancer Other   . Stroke Other   . Cancer Maternal Grandmother     ovarian   Social History  Substance Use Topics  . Smoking status: Current Some Day Smoker -- 1.00 packs/day for 45 years    Types: Cigarettes  . Smokeless tobacco: Never Used     Comment: pt states that she smokes about 6 cigs per day  . Alcohol Use: No   OB History    No data available     Review of Systems  Constitutional: Negative for fever.  HENT: Negative for rhinorrhea and sore throat.   Eyes: Negative for redness.  Respiratory: Positive for cough, chest tightness and wheezing. Negative for shortness of breath.   Cardiovascular: Positive for chest pain.  Gastrointestinal: Negative for nausea, vomiting, abdominal pain and diarrhea.  Genitourinary: Negative for dysuria.  Musculoskeletal: Positive for myalgias. Negative for arthralgias.  Skin: Negative for rash.  Neurological: Negative for headaches.    Allergies  Hydrocodone; Other; Oxycodone; Percocet; and Codeine  Home Medications   Prior to Admission medications   Medication Sig Start Date End Date Taking? Authorizing Provider  aspirin 325 MG tablet Take 325 mg by mouth daily.    Historical Provider, MD  clopidogrel (PLAVIX) 75 MG tablet Take 1 tablet (75 mg total) by mouth at bedtime. 02/06/15   Serafina Mitchell, MD  ibuprofen (ADVIL,MOTRIN) 200 MG tablet Take 200 mg by mouth every 6 (six) hours as needed (pain).     Historical Provider, MD  nicotine (NICODERM CQ - DOSED IN MG/24 HOURS) 14 mg/24hr patch Place 1 patch (14 mg total) onto the skin daily. Patient not taking: Reported on 06/10/2014 04/20/14   Lance Bosch, NP  polyethylene glycol (MIRALAX / GLYCOLAX) packet Take 17 g by mouth daily as needed (constipation).    Historical Provider, MD  traMADol (ULTRAM) 50 MG tablet Take 50 mg by mouth every 6 (six) hours as needed for severe pain.    Historical Provider, MD   BP 146/61 mmHg  Pulse 79  Temp(Src) 98.5 F (36.9 C) (Oral)  Resp 23  Wt 63.776 kg  SpO2 95%   Physical Exam  Constitutional: She appears well-developed and well-nourished.  HENT:  Head: Normocephalic and atraumatic.  Mouth/Throat: Oropharynx is clear and moist.  Eyes: Conjunctivae are normal. Right eye exhibits no discharge. Left eye exhibits no discharge.  Neck: Normal range of motion.  Neck supple.  Cardiovascular: Normal rate, regular rhythm and normal heart sounds.   No murmur heard. Pulmonary/Chest: Effort normal. No respiratory distress. She has wheezes (mild end expiratory wheezing scattered). She has no rales. She exhibits tenderness (Mid chest reproducible with palpation).  Very frequent forceful coughing during exam.  Abdominal: Soft. There is no tenderness. There is no rebound and no guarding.  Musculoskeletal: She exhibits no edema or tenderness.  Neurological: She is alert.  Skin: Skin is warm and dry.  Psychiatric: She has a normal mood and affect.  Nursing note and vitals reviewed.   ED Course  Procedures (including critical care time) Labs Review Labs Reviewed  BASIC METABOLIC PANEL - Abnormal; Notable for the following:    Calcium 8.7 (*)    All other components within normal limits  CBC - Abnormal; Notable for the following:    Hemoglobin 15.1 (*)    All other components within normal  limits  I-STAT TROPOININ, ED    Imaging Review Dg Chest 2 View  03/16/2015  CLINICAL DATA:  Productive cough for 3 months EXAM: CHEST  2 VIEW COMPARISON:  11/20/2014 FINDINGS: The heart size and mediastinal contours are within normal limits. Both lungs are clear. The visualized skeletal structures are unremarkable. Stable left subclavian artery stent. IMPRESSION: No active cardiopulmonary disease. Electronically Signed   By: Skipper Cliche M.D.   On: 03/16/2015 15:19   I have personally reviewed and evaluated these images and lab results as part of my medical decision-making.   EKG Interpretation   Date/Time:  Thursday March 16 2015 14:46:54 EST Ventricular Rate:  88 PR Interval:  120 QRS Duration: 82 QT Interval:  330 QTC Calculation: 399 R Axis:   58 Text Interpretation:  Normal sinus rhythm Septal infarct , age  undetermined Abnormal ECG T wave inversion IN AVL CHANGED FROM PRIOR  Confirmed by Christy Gentles  MD, DONALD ([id number]) on 03/16/2015 9:24:49 PM       9:55 PM Patient seen and examined. Discussed with Dr. Christy Gentles. Will likely d/c to home with symptomatic tx, course of antibiotics. Work-up initiated. Medications ordered.   Vital signs reviewed and are as follows: BP 112/81 mmHg  Pulse 80  Temp(Src) 98.5 F (36.9 C) (Oral)  Resp 23  Wt 63.776 kg  SpO2 99%  10:04 PM Albuterol treatment given. Patient states she is ready to go home.   Tx: albuterol, azithromycin, tessalon, PCP f/u.   Patient encouraged to follow-up with PCP for recheck in the next 48 hours. Return to the emergency department with fever, worsening shortness of breath, increased work of breathing, or other concerns. She verbalizes understanding and agrees with plan.   MDM   Final diagnoses:  Cough  Musculoskeletal pain   Patient with cough 3 months. Chest x-ray is clear today. Patient is afebrile nontoxic. Her lab results are reassuring. Elevated WBC. No concern for PNA given normal lung exam/x-ray.  Will treat for an episode of bronchitis given wheezing noted on exam. Given duration of symptoms will treat with azithromycin trial. Etiology could also include GERD, postnasal drip.   Patient does have some chest pain however this is reproducible, constant for some time, worse with coughing. Do not suspect PE or ACS given this presentation.   Carlisle Cater, PA-C 03/16/15 2209  Ripley Fraise, MD 03/16/15 [phone]

## 2015-03-20 ENCOUNTER — Encounter (HOSPITAL_COMMUNITY): Payer: Medicare Other

## 2015-03-20 ENCOUNTER — Ambulatory Visit: Payer: Medicare Other | Admitting: Surgery

## 2015-03-27 ENCOUNTER — Ambulatory Visit: Payer: Medicare Other | Admitting: Surgery

## 2015-03-27 ENCOUNTER — Encounter: Payer: Self-pay | Admitting: Surgery

## 2015-03-27 ENCOUNTER — Encounter (HOSPITAL_COMMUNITY): Payer: Medicare Other

## 2015-04-03 ENCOUNTER — Encounter: Payer: Self-pay | Admitting: Surgery

## 2015-04-03 ENCOUNTER — Ambulatory Visit (INDEPENDENT_AMBULATORY_CARE_PROVIDER_SITE_OTHER): Payer: Medicare Other | Admitting: Surgery

## 2015-04-03 ENCOUNTER — Ambulatory Visit (HOSPITAL_COMMUNITY)
Admission: RE | Admit: 2015-04-03 | Discharge: 2015-04-03 | Disposition: A | Discharge status: Home / Self Care | Payer: Medicare Other | Source: Ambulatory Visit | Attending: Surgery | Admitting: Surgery

## 2015-04-03 ENCOUNTER — Ambulatory Visit (INDEPENDENT_AMBULATORY_CARE_PROVIDER_SITE_OTHER)
Admission: RE | Admit: 2015-04-03 | Discharge: 2015-04-03 | Disposition: A | Discharge status: Home / Self Care | Payer: Medicare Other | Source: Ambulatory Visit | Attending: Surgery | Admitting: Surgery

## 2015-04-03 VITALS — BP 136/64 | HR 62 | Temp 98.6°F | Resp 16 | Ht 64.0 in | Wt 138.0 lb

## 2015-04-03 DIAGNOSIS — Z95828 Presence of other vascular implants and grafts: Secondary | ICD-10-CM | POA: Diagnosis not present

## 2015-04-03 DIAGNOSIS — I6523 Occlusion and stenosis of bilateral carotid arteries: Secondary | ICD-10-CM

## 2015-04-03 DIAGNOSIS — I739 Peripheral vascular disease, unspecified: Secondary | ICD-10-CM | POA: Diagnosis not present

## 2015-04-03 NOTE — Progress Notes (Signed)
Patient name: Kimberly [name] MRN: [id number] DOB: 29-Sep-1950 Sex: female     Chief Complaint  Patient presents with  . Carotid    1 year follow up     HISTORY OF PRESENT ILLNESS: The patient is back today for follow-up. 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 She went back for lysis of adhesions secondary to abdominal pain in April. In June 2013 she underwent left carotid endarterectomy for a high-grade asymptomatic stenosis. She then later presented with dizziness and temporary vision loss. On 06/24/2012 she had stenting of her left subclavian artery. 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 On 08/13/2013 she underwent right carotid endarterectomy for an asymptomatic right carotid stenosis  Past Medical History  Diagnosis Date  . Asthma   . Hyperlipidemia   . History of lead poisoning 1977  . Shortness of breath   . Peripheral vascular disease (HCC)     claudication  right greater than left  femoral artery  . Anxiety   . Blood transfusion   . Migraine   . Subclavian artery disease ([place])     left  . Small bowel obstruction, partial ([place]) 04/08/11  . Angina     March 2013  . Depression   . Uterine cancer (HCC)     Pre cervical  . Fibromyalgia   . Anemia   . Stroke ([place])   . Hypertension     Past Surgical History  Procedure Laterality Date  . Abdominal angiogram    . Aorta - bilateral femoral artery bypass graft  02/26/2011    Procedure: AORTA BIFEMORAL BYPASS GRAFT;  Surgeon: Theotis Burrow, MD;  Location: [place] OR;  Service: Vascular;  Laterality: N/A;  . Pr vein bypass graft,aorto-fem-pop  02/26/11  . Laparotomy  04/10/2011    Procedure: EXPLORATORY LAPAROTOMY;  Surgeon: Shann Medal, MD;  Location: [place];  Service: General;  Laterality: N/A;  ENTEROLYSIS OF ADHESIONS  . Endarterectomy  06/13/2011    Procedure: ENDARTERECTOMY CAROTID;  Surgeon:  Serafina Mitchell, MD;  Location: [facility] OR;  Service: Vascular;  Laterality: Left;  Left carotid artery endarterectomy with vascu-guard patch angioplasty  . Lower extremity angiogram Left 06/24/12    Left stent intervention, left subclavian  . Cholecystectomy N/A 12/08/2012    Procedure: LAPAROSCOPIC CHOLECYSTECTOMY WITH INTRAOPERATIVE CHOLANGIOGRAM;  Surgeon: Haywood Lasso, MD;  Location: [place];  Service: General;  Laterality: N/A;  . Abdominal hysterectomy  2008  . Endarterectomy Right 08/13/2013    Procedure: RIGHT  CAROTID ENDARTERECTOMY CAROTID WITH BOVINE PERICARDIAL PATCH ANGIOPLASTY;  Surgeon: Serafina Mitchell, MD;  Location: [place] OR;  Service: Vascular;  Laterality: Right;  . Carotid endarterectomy Left 06/13/2011    Left  CEA  . Carotid endarterectomy Right 08-13-13    CEA  . Abdominal angiogram N/A 02/05/2011    Procedure: ABDOMINAL ANGIOGRAM;  Surgeon: Serafina Mitchell, MD;  Location: [facility] CATH LAB;  Service: Cardiovascular;  Laterality: N/A;  . Arch aortogram N/A 06/24/2012    Procedure: ARCH AORTOGRAM;  Surgeon: Serafina Mitchell, MD;  Location: [facility] CATH LAB;  Service: Cardiovascular;  Laterality: N/A;  . Bilateral upper extremity angiogram Left 08/12/2013    Procedure: UPPER EXTREMITY ANGIOGRAM;  Surgeon: Serafina Mitchell, MD;  Location: [facility] CATH LAB;  Service: Cardiovascular;  Laterality: Left;  . Esophagogastroduodenoscopy (egd) with propofol N/A 06/16/2014    Procedure: ESOPHAGOGASTRODUODENOSCOPY (EGD) WITH PROPOFOL;  Surgeon: Hassell Done  Sandria Senter, MD;  Location: [facility];  Service: Endoscopy;  Laterality: N/A;  . Colonoscopy with propofol N/A 06/16/2014    Procedure: COLONOSCOPY WITH PROPOFOL;  Surgeon: Garlan Fair, MD;  Location: [facility] ENDOS[facility];  Service: Endoscopy;  Laterality: N/A;    Social History   Social History  . Marital Status: Legally Separated    Spouse Name: N/A  . Number of Children: N/A  . Years of Education: N/A   Occupational History  . Not on file.   Social History  Main Topics  . Smoking status: Current Some Day Smoker -- 1.00 packs/day for 45 years    Types: Cigarettes  . Smokeless tobacco: Never Used     Comment: pt states that she smokes about 6 cigs per day  . Alcohol Use: No  . Drug Use: No     Comment: History of Cocaine and marijuana abuse: "not in many years" (04/08/11  . Sexual Activity: Not Currently   Other Topics Concern  . Not on file   Social History Narrative   Pt lives at home with her 2 grandsons. She has been separated for 46yrs, has two children, and has a 12th grade education level. She drinks 3 cups of coffee daily.    Family History  Problem Relation Age of Onset  . Cancer Mother     breast, colon, ovarian  . Anesthesia problems Neg Hx   . Cancer Brother 50    lung  . Hyperlipidemia Brother   . Cancer Other   . Stroke Other   . Cancer Maternal Grandmother     ovarian    Allergies as of 04/03/2015 - Review Complete 04/03/2015  Allergen Reaction Noted  . Hydrocodone Nausea And Vomiting and Other (See Comments) 01/11/2013  . Other Shortness Of Breath 06/11/2011  . Oxycodone Nausea And Vomiting and Other (See Comments) 01/11/2013  . Percocet [oxycodone-acetaminophen] Nausea And Vomiting 01/11/2013  . Codeine Nausea And Vomiting 05/02/2011  . Vicodin [hydrocodone-acetaminophen] Nausea And Vomiting 03/16/2015    Current Outpatient Prescriptions on File Prior to Visit  Medication Sig Dispense Refill  . aspirin 325 MG tablet Take 325 mg by mouth daily.    . benzonatate (TESSALON) 100 MG capsule Take 1 capsule (100 mg total) by mouth every 8 (eight) hours. 15 capsule 0  . clopidogrel (PLAVIX) 75 MG tablet Take 1 tablet (75 mg total) by mouth at bedtime. 90 tablet 2  . ibuprofen (ADVIL,MOTRIN) 200 MG tablet Take 200 mg by mouth every 6 (six) hours as needed (pain).     . polyethylene glycol (MIRALAX / GLYCOLAX) packet Take 17 g by mouth daily as needed (constipation).    . traMADol (ULTRAM) 50 MG tablet Take 50 mg by  mouth every 6 (six) hours as needed for severe pain.     No current facility-administered medications on file prior to visit.     REVIEW OF SYSTEMS: Cardiovascular: No chest pain, chest pressure, palpitations, orthopnea, or dyspnea on exertion. No claudication or rest pain,  No history of DVT or phlebitis. Pulmonary: No productive cough, asthma or wheezing. Neurologic: No weakness, paresthesias, aphasia, or amaurosis. No dizziness. Hematologic: No bleeding problems or clotting disorders. Musculoskeletal: No joint pain or joint swelling. Gastrointestinal: No blood in stool or hematemesis Genitourinary: No dysuria or hematuria. Psychiatric:: No history of major depression. Integumentary: No rashes or ulcers. Constitutional: No fever or chills.  PHYSICAL EXAMINATION:   Vital signs are  Filed Vitals:   04/03/15 0938 04/03/15 0945  BP: 132/76 136/64  Pulse: 66 62  Temp: 98.6 F (37 C)   TempSrc: Oral   Resp: 16   Height: 5\' 4"  (1.626 m)   Weight: 138 lb (62.596 kg)   SpO2: 99%    Body mass index is 23.68 kg/(m^2). General: The patient appears their stated age. HEENT:  No gross abnormalities Pulmonary:  Non labored breathing Abdomen: Soft and non-tenderNo hernia Musculoskeletal: There are no major deformities. Neurologic: No focal weakness or paresthesias are detected, Skin: There are no ulcer or rashes noted. Psychiatric: The patient has normal affect. Cardiovascular: There is a regular rate and rhythm without significant murmur appreciated.Bilateral carotid bruit.  There is a bruit in the left supraclavicular fossa   Diagnostic Studies I have ordered and reviewed her vascular lab studies today  Carotid: Stable hyperplasia at the proximal and distal patch sites on the right, 40-59 percent.  The left carotid endarterectomy is widely patent.  The left subclavian stent was difficult to visualize however velocities were elevated at 5 66 cm/s with turbulent waveforms.  The left  brachial pressures 129 compared to 142 on the right  Right ABI is 1.05 with triphasic waveforms.  The left ABI is 0.93 with triphasic waveforms.  Assessment: #1: Carotid stenosis #2: Subclavian stenosis, left #3: Status post aortobifemoral bypass Plan: #1: Patient will follow up in 1 year with carotid duplex #2: The patient does report some dizziness which began approximately 3 months ago.  Conceivably, this could be related to her subclavian stenosis, therefore I have recommended proceeding with angiography.  I believe this can be done through a left brachial approach.  It is scheduled for Wednesday, April 19.  She has previously undergone stenting using a 7 mm stent and subsequent drug coated balloon angioplasty with a 7 mm drug coated balloon #3: She will follow up in one year with surveillance ABIs.  Eldridge Abrahams, M.D. [facility] Office: [phone] Pager:  [phone]

## 2015-04-04 ENCOUNTER — Other Ambulatory Visit: Payer: Self-pay

## 2015-04-04 DIAGNOSIS — I6529 Occlusion and stenosis of unspecified carotid artery: Secondary | ICD-10-CM

## 2015-04-06 ENCOUNTER — Other Ambulatory Visit: Payer: Self-pay

## 2015-04-06 DIAGNOSIS — I6529 Occlusion and stenosis of unspecified carotid artery: Secondary | ICD-10-CM

## 2015-04-06 MED ORDER — CLOPIDOGREL BISULFATE 75 MG PO TABS: 75.0000 mg | ORAL_TABLET | Freq: Every day | ORAL | Status: DC

## 2015-04-26 ENCOUNTER — Ambulatory Visit (HOSPITAL_COMMUNITY)
Admission: RE | Admit: 2015-04-26 | Discharge: 2015-04-26 | Disposition: A | Discharge status: Home / Self Care | Payer: Medicare Other | Source: Ambulatory Visit | Attending: Surgery | Admitting: Surgery

## 2015-04-26 ENCOUNTER — Encounter (HOSPITAL_COMMUNITY)
Admission: RE | Disposition: A | Discharge status: Home / Self Care | Payer: Self-pay | Source: Ambulatory Visit | Attending: Surgery

## 2015-04-26 ENCOUNTER — Other Ambulatory Visit: Payer: Self-pay | Admitting: *Deleted

## 2015-04-26 DIAGNOSIS — I771 Stricture of artery: Secondary | ICD-10-CM

## 2015-04-26 DIAGNOSIS — I6529 Occlusion and stenosis of unspecified carotid artery: Secondary | ICD-10-CM | POA: Insufficient documentation

## 2015-04-26 DIAGNOSIS — J45909 Unspecified asthma, uncomplicated: Secondary | ICD-10-CM | POA: Diagnosis not present

## 2015-04-26 DIAGNOSIS — I708 Atherosclerosis of other arteries: Secondary | ICD-10-CM | POA: Diagnosis not present

## 2015-04-26 DIAGNOSIS — E785 Hyperlipidemia, unspecified: Secondary | ICD-10-CM | POA: Diagnosis not present

## 2015-04-26 DIAGNOSIS — M797 Fibromyalgia: Secondary | ICD-10-CM | POA: Diagnosis not present

## 2015-04-26 DIAGNOSIS — Z7982 Long term (current) use of aspirin: Secondary | ICD-10-CM | POA: Insufficient documentation

## 2015-04-26 DIAGNOSIS — Y812 Prosthetic and other implants, materials and accessory general- and plastic-surgery devices associated with adverse incidents: Secondary | ICD-10-CM | POA: Insufficient documentation

## 2015-04-26 DIAGNOSIS — F17210 Nicotine dependence, cigarettes, uncomplicated: Secondary | ICD-10-CM | POA: Diagnosis not present

## 2015-04-26 DIAGNOSIS — I10 Essential (primary) hypertension: Secondary | ICD-10-CM | POA: Insufficient documentation

## 2015-04-26 DIAGNOSIS — T82858A Stenosis of vascular prosthetic devices, implants and grafts, initial encounter: Secondary | ICD-10-CM | POA: Insufficient documentation

## 2015-04-26 DIAGNOSIS — F419 Anxiety disorder, unspecified: Secondary | ICD-10-CM | POA: Insufficient documentation

## 2015-04-26 DIAGNOSIS — I739 Peripheral vascular disease, unspecified: Secondary | ICD-10-CM | POA: Diagnosis not present

## 2015-04-26 DIAGNOSIS — T82856A Stenosis of peripheral vascular stent, initial encounter: Secondary | ICD-10-CM | POA: Diagnosis not present

## 2015-04-26 DIAGNOSIS — F329 Major depressive disorder, single episode, unspecified: Secondary | ICD-10-CM | POA: Insufficient documentation

## 2015-04-26 DIAGNOSIS — Z8542 Personal history of malignant neoplasm of other parts of uterus: Secondary | ICD-10-CM | POA: Diagnosis not present

## 2015-04-26 DIAGNOSIS — I6523 Occlusion and stenosis of bilateral carotid arteries: Secondary | ICD-10-CM

## 2015-04-26 DIAGNOSIS — G43909 Migraine, unspecified, not intractable, without status migrainosus: Secondary | ICD-10-CM | POA: Insufficient documentation

## 2015-04-26 DIAGNOSIS — Z8673 Personal history of transient ischemic attack (TIA), and cerebral infarction without residual deficits: Secondary | ICD-10-CM | POA: Insufficient documentation

## 2015-04-26 DIAGNOSIS — Z7902 Long term (current) use of antithrombotics/antiplatelets: Secondary | ICD-10-CM | POA: Insufficient documentation

## 2015-04-26 HISTORY — PX: PERIPHERAL VASCULAR CATHETERIZATION: SHX172C

## 2015-04-26 LAB — POCT I-STAT, CHEM 8
BUN: 21 mg/dL — ABNORMAL HIGH (ref 6–20)
Calcium, Ion: 1.14 mmol/L (ref 1.13–1.30)
Chloride: 101 mmol/L (ref 101–111)
Creatinine, Ser: 0.9 mg/dL (ref 0.44–1.00)
Glucose, Bld: 89 mg/dL (ref 65–99)
HCT: 48 % — ABNORMAL HIGH (ref 36.0–46.0)
Hemoglobin: 16.3 g/dL — ABNORMAL HIGH (ref 12.0–15.0)
Potassium: 4.6 mmol/L (ref 3.5–5.1)
Sodium: 139 mmol/L (ref 135–145)
TCO2: 30 mmol/L (ref 0–100)

## 2015-04-26 LAB — POCT ACTIVATED CLOTTING TIME: Activated Clotting Time: 147 seconds

## 2015-04-26 SURGERY — AORTIC ARCH ANGIOGRAPHY
Anesthesia: LOCAL

## 2015-04-26 MED ORDER — HEPARIN (PORCINE) IN NACL 2-0.9 UNIT/ML-% IJ SOLN
INTRAMUSCULAR | Status: AC
  Filled 2015-04-26: qty 1000

## 2015-04-26 MED ORDER — PROTAMINE SULFATE 10 MG/ML IV SOLN
INTRAVENOUS | Status: AC
  Filled 2015-04-26: qty 5

## 2015-04-26 MED ORDER — LIDOCAINE HCL (PF) 1 % IJ SOLN
INTRAMUSCULAR | Status: AC
  Filled 2015-04-26: qty 30

## 2015-04-26 MED ORDER — IODIXANOL 320 MG/ML IV SOLN
INTRAVENOUS | Status: DC | PRN
  Administered 2015-04-26: 20 mL via INTRA_ARTERIAL

## 2015-04-26 MED ORDER — DOCUSATE SODIUM 100 MG PO CAPS: 100.0000 mg | ORAL_CAPSULE | Freq: Every day | ORAL | Status: DC

## 2015-04-26 MED ORDER — MIDAZOLAM HCL 2 MG/2ML IJ SOLN
INTRAMUSCULAR | Status: AC
Start: 2015-04-26 — End: 2015-04-26
  Filled 2015-04-26: qty 2

## 2015-04-26 MED ORDER — SODIUM CHLORIDE 0.9 % IV SOLN
INTRAVENOUS | Status: DC
  Administered 2015-04-26: 07:00:00 via INTRAVENOUS

## 2015-04-26 MED ORDER — NITROGLYCERIN 1 MG/10 ML FOR IR/CATH LAB
INTRA_ARTERIAL | Status: DC | PRN
  Administered 2015-04-26: 200 ug via INTRA_ARTERIAL

## 2015-04-26 MED ORDER — ONDANSETRON HCL 4 MG/2ML IJ SOLN: 4.0000 mg | Freq: Four times a day (QID) | INTRAMUSCULAR | Status: DC | PRN

## 2015-04-26 MED ORDER — MIDAZOLAM HCL 2 MG/2ML IJ SOLN
INTRAMUSCULAR | Status: DC | PRN
  Administered 2015-04-26: 2 mg via INTRAVENOUS

## 2015-04-26 MED ORDER — FENTANYL CITRATE (PF) 100 MCG/2ML IJ SOLN
INTRAMUSCULAR | Status: AC
  Filled 2015-04-26: qty 2

## 2015-04-26 MED ORDER — NITROGLYCERIN 1 MG/10 ML FOR IR/CATH LAB
INTRA_ARTERIAL | Status: AC
  Filled 2015-04-26: qty 10

## 2015-04-26 MED ORDER — FENTANYL CITRATE (PF) 100 MCG/2ML IJ SOLN
INTRAMUSCULAR | Status: DC | PRN
  Administered 2015-04-26: 50 ug via INTRAVENOUS

## 2015-04-26 MED ORDER — HEPARIN (PORCINE) IN NACL 2-0.9 UNIT/ML-% IJ SOLN
INTRAMUSCULAR | Status: DC | PRN
  Administered 2015-04-26: 1000 mL

## 2015-04-26 MED ORDER — PHENOL 1.4 % MT LIQD: 1.0000 | OROMUCOSAL | Status: DC | PRN

## 2015-04-26 MED ORDER — METOPROLOL TARTRATE 1 MG/ML IV SOLN: 2.0000 mg | INTRAVENOUS | Status: DC | PRN

## 2015-04-26 MED ORDER — HEPARIN SODIUM (PORCINE) 1000 UNIT/ML IJ SOLN
INTRAMUSCULAR | Status: DC | PRN
  Administered 2015-04-26: 2000 [IU] via INTRAVENOUS
  Administered 2015-04-26: 4000 [IU] via INTRAVENOUS

## 2015-04-26 MED ORDER — ALUM & MAG HYDROXIDE-SIMETH 200-200-20 MG/5ML PO SUSP
15.0000 mL | ORAL | Status: DC | PRN
Start: 2015-04-26 — End: 2015-04-26

## 2015-04-26 MED ORDER — SODIUM CHLORIDE 0.9 % IV SOLN: 1.0000 mL/kg/h | INTRAVENOUS | Status: DC

## 2015-04-26 MED ORDER — PROTAMINE SULFATE 10 MG/ML IV SOLN
INTRAVENOUS | Status: DC | PRN
  Administered 2015-04-26: 50 mg via INTRAVENOUS

## 2015-04-26 MED ORDER — HEPARIN SODIUM (PORCINE) 1000 UNIT/ML IJ SOLN
INTRAMUSCULAR | Status: AC
  Filled 2015-04-26: qty 1

## 2015-04-26 MED ORDER — GUAIFENESIN-DM 100-10 MG/5ML PO SYRP: 15.0000 mL | ORAL_SOLUTION | ORAL | Status: DC | PRN

## 2015-04-26 MED ORDER — LIDOCAINE HCL (PF) 1 % IJ SOLN
INTRAMUSCULAR | Status: DC | PRN
  Administered 2015-04-26: 5 mL

## 2015-04-26 MED ORDER — LABETALOL HCL 5 MG/ML IV SOLN: 10.0000 mg | INTRAVENOUS | Status: DC | PRN

## 2015-04-26 MED ORDER — HYDRALAZINE HCL 20 MG/ML IJ SOLN: 5.0000 mg | INTRAMUSCULAR | Status: DC | PRN

## 2015-04-26 SURGICAL SUPPLY — 13 items
CATH ANGIO 5F BER2 65CM (CATHETERS) ×1 IMPLANT
KIT MICROINTRODUCER STIFF 5F (SHEATH) ×1 IMPLANT
KIT PV (KITS) ×3 IMPLANT
SHEATH PINNACLE 5F 10CM (SHEATH) ×1 IMPLANT
SHEATH PINNACLE MP 6F 45CM (SHEATH) ×1 IMPLANT
SHEATH PINNACLE R/O II 6F 4CM (SHEATH) ×1 IMPLANT
STENT OMNILINK ELITE 7X16X80 (Permanent Stent) ×1 IMPLANT
STOPCOCK MORSE 400PSI 3WAY (MISCELLANEOUS) ×1 IMPLANT
SYR MEDRAD MARK V 150ML (SYRINGE) ×2 IMPLANT
TRANSDUCER W/STOPCOCK (MISCELLANEOUS) ×3 IMPLANT
TRAY PV CATH (CUSTOM PROCEDURE TRAY) ×3 IMPLANT
TUBING HIGH PRESSURE 120CM (CONNECTOR) IMPLANT
WIRE BENTSON .035X145CM (WIRE) ×2 IMPLANT

## 2015-04-26 NOTE — Discharge Instructions (Signed)
Angiogram, Care After °Refer to this sheet in the next few weeks. These instructions provide you with information about caring for yourself after your procedure. Your health care provider may also give you more specific instructions. Your treatment has been planned according to current medical practices, but problems sometimes occur. Call your health care provider if you have any problems or questions after your procedure. °WHAT TO EXPECT AFTER THE PROCEDURE °After your procedure, it is typical to have the following: °· Bruising at the catheter insertion site that usually fades within 1-2 weeks. °· Blood collecting in the tissue (hematoma) that may be painful to the touch. It should usually decrease in size and tenderness within 1-2 weeks. °HOME CARE INSTRUCTIONS °· Take medicines only as directed by your health care provider. °· You may shower 24-48 hours after the procedure or as directed by your health care provider. Remove the bandage (dressing) and gently wash the site with plain soap and water. Pat the area dry with a clean towel. Do not rub the site, because this may cause bleeding. °· Do not take baths, swim, or use a hot tub until your health care provider approves. °· Check your insertion site every day for redness, swelling, or drainage. °· Do not apply powder or lotion to the site. °· Do not lift over 10 lb (4.5 kg) for 5 days after your procedure or as directed by your health care provider. °· Ask your health care provider when it is okay to: °¨ Return to work or school. °¨ Resume usual physical activities or sports. °¨ Resume sexual activity. °· Do not drive home if you are discharged the same day as the procedure. Have someone else drive you. °· You may drive 24 hours after the procedure unless otherwise instructed by your health care provider. °· Do not operate machinery or power tools for 24 hours after the procedure or as directed by your health care provider. °· If your procedure was done as an  outpatient procedure, which means that you went home the same day as your procedure, a responsible adult should be with you for the first 24 hours after you arrive home. °· Keep all follow-up visits as directed by your health care provider. This is important. °SEEK MEDICAL CARE IF: °· You have a fever. °· You have chills. °· You have increased bleeding from the catheter insertion site. Hold pressure on the site. °SEEK IMMEDIATE MEDICAL CARE IF: °· You have unusual pain at the catheter insertion site. °· You have redness, warmth, or swelling at the catheter insertion site. °· You have drainage (other than a small amount of blood on the dressing) from the catheter insertion site. °· The catheter insertion site is bleeding, and the bleeding does not stop after 30 minutes of holding steady pressure on the site. °· The area near or just beyond the catheter insertion site becomes pale, cool, tingly, or numb. °  °This information is not intended to replace advice given to you by your health care provider. Make sure you discuss any questions you have with your health care provider. °  °Document Released: 07/12/2004 Document Revised: 01/14/2014 Document Reviewed: 05/27/2012 °Elsevier Interactive Patient Education ©2016 [employer] ° °

## 2015-04-26 NOTE — Op Note (Addendum)
    Patient name: Kimberly [name] MRN: [id number] DOB: 10-26-1950 Sex: female  04/26/2015 Pre-operative Diagnosis: in-stent stenosis, left subclavian artery Post-operative diagnosis:  Same Surgeon:  Annamarie Major Procedure Performed:  1.  Ultrasound-guided access, left brachial artery  2.  Left upper extremity runoff  3.  Stent, left subclavian artery  4.  Conscious sedation (8:48-9:15)    Indications:  The patient has a history of left subclavian artery stenting.  This is also had to be dilated with a drug coated balloon in the past approximately 1.5 years ago.  Her recent ultrasound showed significantly elevated velocities in this area, greater than 500 cm/s.  The patient also is complaining of lightheadedness and dizziness which were her symptoms before the stent was placed.  She comes in today for further evaluation and possible intervention.  Procedure:  The patient was identified in the holding area and taken to room 8.  The patient was then placed supine on the table and prepped and draped in the usual sterile fashion.  A time out was called.  Ultrasound was used to evaluate the left bbrachial artery which was widely patent and without significant calcification.  Conscious sedation was performed with the use of IV fentanyl and Versed.  Start sedation was 848, end sedation was 9:15.  Continuous cardiopulmonary monitoring was performed throughout the proceduremonitoring heart rate oxygen levels in blood pressure.  Circulating nurse and myself were present for the entire procedure.   The left brachial artery was then cannulated under ultrasound guidance with a micropuncture needle.  An 014 wire went without resistance.  A micropuncture sheath was placed.  Next, a 035 wire was inserted followed by 5 [place] sheath.  400 g of nitroglycerin into thousand units of heparin were administered through the sheath.  Next, using a Berenstein  Catheter and a Benson wire, the catheter was placed in the   Subclavian artery  At the level of the vertebral artery, and a retrograde injection was performed.  This showed the subclavian artery stent had approximately a 80% stenosis at its distal point.  No other pathologies were identified.  The vertebral artery and internal mammary artery on the left widely patent.  At this point, the decision was made to proceed with intervention.  A 6 French 45 cm sheath was then inserted.  The patient was given an additional 4000 units of heparin.  I advanced the Bentson wire across the stenosis and then follow this with the sheath and dilator.  I selected a 7 x 16 Omni link balloon expandable stent.  This was placed overlapping the distal portion of the stent and taken to 11 atm.  I then deflated the balloon and  Advanced the balloon to perform balloon angioplasty of the previously deployed stent.  Completion imaging revealed resolution of the stenosis and continued patency of the vertebral subclavian and internal mammary artery.  At this point catheters and wires were removed.  The long 6 French sheath was exchanged out for a short 6 [place] sheath   Impression:  #1  80% stenosis at the distal previously deployed left subclavian stent.  This was treated by placing a second 7 x 16 Omni link balloon expandable stent with residual stenosis less than 5%.    Theotis Burrow, M.D. [facility] [place] Office: [phone] Pager:  [phone]

## 2015-04-26 NOTE — H&P (View-Only) (Signed)
Patient name: Kimberly [name] MRN: [id number] DOB: [birth year] Sex: female     Chief Complaint  Patient presents with  . Carotid    1 year follow up     HISTORY OF PRESENT ILLNESS: The patient is back today for follow-up. 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 She went back for lysis of adhesions secondary to abdominal pain in April. In June 2013 she underwent left carotid endarterectomy for a high-grade asymptomatic stenosis. She then later presented with dizziness and temporary vision loss. On 06/24/2012 she had stenting of her left subclavian artery. 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 On 08/13/2013 she underwent right carotid endarterectomy for an asymptomatic right carotid stenosis  Past Medical History  Diagnosis Date  . Asthma   . Hyperlipidemia   . History of lead poisoning 1977  . Shortness of breath   . Peripheral vascular disease (HCC)     claudication  right greater than left  femoral artery  . Anxiety   . Blood transfusion   . Migraine   . Subclavian artery disease ([place])     left  . Small bowel obstruction, partial ([place]) 04/08/11  . Angina     March 2013  . Depression   . Uterine cancer (HCC)     Pre cervical  . Fibromyalgia   . Anemia   . Stroke ([place])   . Hypertension     Past Surgical History  Procedure Laterality Date  . Abdominal angiogram    . Aorta - bilateral femoral artery bypass graft  02/26/2011    Procedure: AORTA BIFEMORAL BYPASS GRAFT;  Surgeon: Theotis Burrow, MD;  Location: [place] OR;  Service: Vascular;  Laterality: N/A;  . Pr vein bypass graft,aorto-fem-pop  02/26/11  . Laparotomy  04/10/2011    Procedure: EXPLORATORY LAPAROTOMY;  Surgeon: Shann Medal, MD;  Location: [place];  Service: General;  Laterality: N/A;  ENTEROLYSIS OF ADHESIONS  . Endarterectomy  06/13/2011    Procedure: ENDARTERECTOMY CAROTID;  Surgeon:  Serafina Mitchell, MD;  Location: [facility];  Service: Vascular;  Laterality: Left;  Left carotid artery endarterectomy with vascu-guard patch angioplasty  . Lower extremity angiogram Left 06/24/12    Left stent intervention, left subclavian  . Cholecystectomy N/A 12/08/2012    Procedure: LAPAROSCOPIC CHOLECYSTECTOMY WITH INTRAOPERATIVE CHOLANGIOGRAM;  Surgeon: Haywood Lasso, MD;  Location: [place];  Service: General;  Laterality: N/A;  . Abdominal hysterectomy  2008  . Endarterectomy Right 08/13/2013    Procedure: RIGHT  CAROTID ENDARTERECTOMY CAROTID WITH BOVINE PERICARDIAL PATCH ANGIOPLASTY;  Surgeon: Serafina Mitchell, MD;  Location: [place] OR;  Service: Vascular;  Laterality: Right;  . Carotid endarterectomy Left 06/13/2011    Left  CEA  . Carotid endarterectomy Right 08-13-13    CEA  . Abdominal angiogram N/A 02/05/2011    Procedure: ABDOMINAL ANGIOGRAM;  Surgeon: Serafina Mitchell, MD;  Location: [facility] CATH LAB;  Service: Cardiovascular;  Laterality: N/A;  . Arch aortogram N/A 06/24/2012    Procedure: ARCH AORTOGRAM;  Surgeon: Serafina Mitchell, MD;  Location: [facility] CATH LAB;  Service: Cardiovascular;  Laterality: N/A;  . Bilateral upper extremity angiogram Left 08/12/2013    Procedure: UPPER EXTREMITY ANGIOGRAM;  Surgeon: Serafina Mitchell, MD;  [facility];  Service: Cardiovascular;  Laterality: Left;  . Esophagogastroduodenoscopy (egd) with propofol N/A 06/16/2014    Procedure: ESOPHAGOGASTRODUODENOSCOPY (EGD) WITH PROPOFOL;  Surgeon: Hassell Done  Sandria Senter, MD;  Location: [facility];  Service: Endoscopy;  Laterality: N/A;  . Colonoscopy with propofol N/A 06/16/2014    Procedure: COLONOSCOPY WITH PROPOFOL;  Surgeon: Garlan Fair, MD;  Location: [facility] ENDOSCOPY;  Service: Endoscopy;  Laterality: N/A;    Social History   Social History  . Marital Status: Legally Separated    Spouse Name: N/A  . Number of Children: N/A  . Years of Education: N/A   Occupational History  . Not on file.   Social History  Main Topics  . Smoking status: Current Some Day Smoker -- 1.00 packs/day for 45 years    Types: Cigarettes  . Smokeless tobacco: Never Used     Comment: pt states that she smokes about 6 cigs per day  . Alcohol Use: No  . Drug Use: No     Comment: History of Cocaine and marijuana abuse: "not in many years" (04/08/11  . Sexual Activity: Not Currently   Other Topics Concern  . Not on file   Social History Narrative   Pt lives at home with her 2 grandsons. She has been separated for 49yrs, has two children, and has a 12th grade education level. She drinks 3 cups of coffee daily.    Family History  Problem Relation Age of Onset  . Cancer Mother     breast, colon, ovarian  . Anesthesia problems Neg Hx   . Cancer Brother 50    lung  . Hyperlipidemia Brother   . Cancer Other   . Stroke Other   . Cancer Maternal Grandmother     ovarian    Allergies as of 04/03/2015 - Review Complete 04/03/2015  Allergen Reaction Noted  . Hydrocodone Nausea And Vomiting and Other (See Comments) 01/11/2013  . Other Shortness Of Breath 06/11/2011  . Oxycodone Nausea And Vomiting and Other (See Comments) 01/11/2013  . Percocet [oxycodone-acetaminophen] Nausea And Vomiting 01/11/2013  . Codeine Nausea And Vomiting 05/02/2011  . Vicodin [hydrocodone-acetaminophen] Nausea And Vomiting 03/16/2015    Current Outpatient Prescriptions on File Prior to Visit  Medication Sig Dispense Refill  . aspirin 325 MG tablet Take 325 mg by mouth daily.    . benzonatate (TESSALON) 100 MG capsule Take 1 capsule (100 mg total) by mouth every 8 (eight) hours. 15 capsule 0  . clopidogrel (PLAVIX) 75 MG tablet Take 1 tablet (75 mg total) by mouth at bedtime. 90 tablet 2  . ibuprofen (ADVIL,MOTRIN) 200 MG tablet Take 200 mg by mouth every 6 (six) hours as needed (pain).     . polyethylene glycol (MIRALAX / GLYCOLAX) packet Take 17 g by mouth daily as needed (constipation).    . traMADol (ULTRAM) 50 MG tablet Take 50 mg by  mouth every 6 (six) hours as needed for severe pain.     No current facility-administered medications on file prior to visit.     REVIEW OF SYSTEMS: Cardiovascular: No chest pain, chest pressure, palpitations, orthopnea, or dyspnea on exertion. No claudication or rest pain,  No history of DVT or phlebitis. Pulmonary: No productive cough, asthma or wheezing. Neurologic: No weakness, paresthesias, aphasia, or amaurosis. No dizziness. Hematologic: No bleeding problems or clotting disorders. Musculoskeletal: No joint pain or joint swelling. Gastrointestinal: No blood in stool or hematemesis Genitourinary: No dysuria or hematuria. Psychiatric:: No history of major depression. Integumentary: No rashes or ulcers. Constitutional: No fever or chills.  PHYSICAL EXAMINATION:   Vital signs are  Filed Vitals:   04/03/15 0938 04/03/15 0945  BP: 132/76 136/64  Pulse: 66 62  Temp: 98.6 F (37 C)   TempSrc: Oral   Resp: 16   Height: 5\' 4"  (1.626 m)   Weight: 138 lb (62.596 kg)   SpO2: 99%    Body mass index is 23.68 kg/(m^2). General: The patient appears their stated age. HEENT:  No gross abnormalities Pulmonary:  Non labored breathing Abdomen: Soft and non-tenderNo hernia Musculoskeletal: There are no major deformities. Neurologic: No focal weakness or paresthesias are detected, Skin: There are no ulcer or rashes noted. Psychiatric: The patient has normal affect. Cardiovascular: There is a regular rate and rhythm without significant murmur appreciated.Bilateral carotid bruit.  There is a bruit in the left supraclavicular fossa   Diagnostic Studies I have ordered and reviewed her vascular lab studies today  Carotid: Stable hyperplasia at the proximal and distal patch sites on the right, 40-59 percent.  The left carotid endarterectomy is widely patent.  The left subclavian stent was difficult to visualize however velocities were elevated at 5 66 cm/s with turbulent waveforms.  The left  brachial pressures 129 compared to 142 on the right  Right ABI is 1.05 with triphasic waveforms.  The left ABI is 0.93 with triphasic waveforms.  Assessment: #1: Carotid stenosis #2: Subclavian stenosis, left #3: Status post aortobifemoral bypass Plan: #1: Patient will follow up in 1 year with carotid duplex #2: The patient does report some dizziness which began approximately 3 months ago.  Conceivably, this could be related to her subclavian stenosis, therefore I have recommended proceeding with angiography.  I believe this can be done through a left brachial approach.  It is scheduled for Wednesday, April 19.  She has previously undergone stenting using a 7 mm stent and subsequent drug coated balloon angioplasty with a 7 mm drug coated balloon #3: She will follow up in one year with surveillance ABIs.  Eldridge Abrahams, M.D. [facility] [place] Office: [phone] Pager:  [phone]

## 2015-04-26 NOTE — Progress Notes (Signed)
Site area: Left brachial arterial Site Prior to Removal:  Level 0 Pressure Applied For:35 min Manual:   yes Patient Status During Pull:  stable Post Pull Site:  Level p Post Pull Instructions Given: yes  Post Pull Pulses Present: palpable Dressing Applied:  pressure Bedrest begins @ 1040 till 1440 Comments:

## 2015-04-26 NOTE — Interval H&P Note (Signed)
History and Physical Interval Note:  04/26/2015 8:35 AM  Kimberly [name]  has presented today for surgery, with the diagnosis of pvd  The various methods of treatment have been discussed with the patient and family. After consideration of risks, benefits and other options for treatment, the patient has consented to  Procedure(s): Aortic Arch Angiography (N/A) as a surgical intervention .  The patient's history has been reviewed, patient examined, no change in status, stable for surgery.  I have reviewed the patient's chart and labs.  Questions were answered to the patient's satisfaction.     Annamarie Major

## 2015-04-27 ENCOUNTER — Telehealth: Payer: Self-pay | Admitting: Surgery

## 2015-04-27 ENCOUNTER — Encounter (HOSPITAL_COMMUNITY): Payer: Self-pay | Admitting: Surgery

## 2015-04-27 NOTE — Telephone Encounter (Signed)
sched appt for 10/30/15, lab at 11:30 and NP at 12:15. Mailed appt letter to inform pt of appt.

## 2015-04-27 NOTE — Telephone Encounter (Signed)
-----   Message from Mena Goes, RN sent at 04/26/2015  9:52 AM EDT ----- Regarding: schedule   ----- Message -----    From: Serafina Mitchell, MD    Sent: 04/26/2015   9:24 AM      To: [facility] Charge Pool  04/26/2015:  Surgeon:  Annamarie Major Procedure Performed:  1.  Ultrasound-guided access, left brachial artery  2.  Left upper extremity runoff  3.  Stent, left subclavian artery  4.  Conscious sedation (8:489:15)   Follow-up 6 months with Vinnie Level with carotid duplex

## 2015-04-30 ENCOUNTER — Emergency Department (HOSPITAL_COMMUNITY): Payer: Medicare Other | Admitting: Anesthesiology

## 2015-04-30 ENCOUNTER — Encounter (HOSPITAL_COMMUNITY): Payer: Self-pay | Admitting: Nurse Practitioner

## 2015-04-30 ENCOUNTER — Emergency Department (HOSPITAL_COMMUNITY): Payer: Medicare Other

## 2015-04-30 ENCOUNTER — Inpatient Hospital Stay (HOSPITAL_COMMUNITY)
Admission: EM | Admit: 2015-04-30 | Discharge: 2015-05-01 | DRG: 254 | Disposition: A | Discharge status: Home / Self Care | Payer: Medicare Other | Attending: Vascular Surgery | Admitting: Vascular Surgery

## 2015-04-30 ENCOUNTER — Encounter (HOSPITAL_COMMUNITY)
Admission: EM | Disposition: A | Discharge status: Home / Self Care | Payer: Self-pay | Source: Home / Self Care | Attending: Vascular Surgery

## 2015-04-30 DIAGNOSIS — Z7902 Long term (current) use of antithrombotics/antiplatelets: Secondary | ICD-10-CM | POA: Diagnosis not present

## 2015-04-30 DIAGNOSIS — Z885 Allergy status to narcotic agent status: Secondary | ICD-10-CM

## 2015-04-30 DIAGNOSIS — I998 Other disorder of circulatory system: Secondary | ICD-10-CM | POA: Diagnosis not present

## 2015-04-30 DIAGNOSIS — R208 Other disturbances of skin sensation: Secondary | ICD-10-CM

## 2015-04-30 DIAGNOSIS — F17200 Nicotine dependence, unspecified, uncomplicated: Secondary | ICD-10-CM | POA: Diagnosis present

## 2015-04-30 DIAGNOSIS — Z888 Allergy status to other drugs, medicaments and biological substances status: Secondary | ICD-10-CM

## 2015-04-30 DIAGNOSIS — M797 Fibromyalgia: Secondary | ICD-10-CM | POA: Diagnosis present

## 2015-04-30 DIAGNOSIS — Z8542 Personal history of malignant neoplasm of other parts of uterus: Secondary | ICD-10-CM | POA: Diagnosis not present

## 2015-04-30 DIAGNOSIS — I739 Peripheral vascular disease, unspecified: Secondary | ICD-10-CM | POA: Diagnosis present

## 2015-04-30 DIAGNOSIS — R200 Anesthesia of skin: Secondary | ICD-10-CM | POA: Diagnosis not present

## 2015-04-30 DIAGNOSIS — R0602 Shortness of breath: Secondary | ICD-10-CM | POA: Diagnosis not present

## 2015-04-30 DIAGNOSIS — Z8673 Personal history of transient ischemic attack (TIA), and cerebral infarction without residual deficits: Secondary | ICD-10-CM | POA: Diagnosis not present

## 2015-04-30 DIAGNOSIS — I10 Essential (primary) hypertension: Secondary | ICD-10-CM | POA: Diagnosis present

## 2015-04-30 DIAGNOSIS — Z7982 Long term (current) use of aspirin: Secondary | ICD-10-CM | POA: Diagnosis not present

## 2015-04-30 DIAGNOSIS — F329 Major depressive disorder, single episode, unspecified: Secondary | ICD-10-CM | POA: Diagnosis present

## 2015-04-30 DIAGNOSIS — Z79899 Other long term (current) drug therapy: Secondary | ICD-10-CM

## 2015-04-30 DIAGNOSIS — E785 Hyperlipidemia, unspecified: Secondary | ICD-10-CM | POA: Diagnosis present

## 2015-04-30 DIAGNOSIS — F419 Anxiety disorder, unspecified: Secondary | ICD-10-CM | POA: Diagnosis present

## 2015-04-30 DIAGNOSIS — I742 Embolism and thrombosis of arteries of the upper extremities: Secondary | ICD-10-CM | POA: Diagnosis not present

## 2015-04-30 HISTORY — PX: EMBOLECTOMY: SHX44

## 2015-04-30 SURGERY — EMBOLECTOMY
Anesthesia: General | Site: Arm Upper | Laterality: Left

## 2015-04-30 MED ORDER — LABETALOL HCL 5 MG/ML IV SOLN
10.0000 mg | INTRAVENOUS | Status: DC | PRN
  Filled 2015-04-30: qty 4

## 2015-04-30 MED ORDER — MORPHINE SULFATE (PF) 2 MG/ML IV SOLN
2.0000 mg | INTRAVENOUS | Status: DC | PRN
  Administered 2015-04-30: 4 mg via INTRAVENOUS
  Administered 2015-05-01: 2 mg via INTRAVENOUS
  Filled 2015-04-30: qty 1
  Filled 2015-04-30: qty 2

## 2015-04-30 MED ORDER — DEXTROSE-NACL 5-0.45 % IV SOLN
INTRAVENOUS | Status: DC
  Administered 2015-04-30: 50 mL via INTRAVENOUS

## 2015-04-30 MED ORDER — SUCCINYLCHOLINE CHLORIDE 20 MG/ML IJ SOLN
INTRAMUSCULAR | Status: DC | PRN
  Administered 2015-04-30: 100 mg via INTRAVENOUS

## 2015-04-30 MED ORDER — LIDOCAINE HCL (CARDIAC) 20 MG/ML IV SOLN
INTRAVENOUS | Status: DC | PRN
  Administered 2015-04-30: 50 mg via INTRATRACHEAL

## 2015-04-30 MED ORDER — THROMBIN 20000 UNITS EX KIT
PACK | CUTANEOUS | Status: AC
  Filled 2015-04-30: qty 1

## 2015-04-30 MED ORDER — CEFAZOLIN SODIUM 1 G IJ SOLR
INTRAMUSCULAR | Status: AC
Start: 2015-04-30 — End: 2015-04-30
  Filled 2015-04-30: qty 20

## 2015-04-30 MED ORDER — FENTANYL CITRATE (PF) 100 MCG/2ML IJ SOLN
25.0000 ug | INTRAMUSCULAR | Status: DC | PRN
  Administered 2015-04-30 (×2): 50 ug via INTRAVENOUS

## 2015-04-30 MED ORDER — HEMOSTATIC AGENTS (NO CHARGE) OPTIME
TOPICAL | Status: DC | PRN
  Administered 2015-04-30: 1 via TOPICAL

## 2015-04-30 MED ORDER — CLOPIDOGREL BISULFATE 75 MG PO TABS
75.0000 mg | ORAL_TABLET | Freq: Every day | ORAL | Status: DC
  Administered 2015-05-01: 75 mg via ORAL
  Filled 2015-04-30: qty 1

## 2015-04-30 MED ORDER — PHENOL 1.4 % MT LIQD: 1.0000 | OROMUCOSAL | Status: DC | PRN

## 2015-04-30 MED ORDER — METOPROLOL TARTRATE 1 MG/ML IV SOLN: 2.0000 mg | INTRAVENOUS | Status: DC | PRN

## 2015-04-30 MED ORDER — LACTATED RINGERS IV SOLN
INTRAVENOUS | Status: DC | PRN
  Administered 2015-04-30 (×2): via INTRAVENOUS

## 2015-04-30 MED ORDER — PANTOPRAZOLE SODIUM 40 MG PO TBEC
40.0000 mg | DELAYED_RELEASE_TABLET | Freq: Every day | ORAL | Status: DC
  Administered 2015-05-01: 40 mg via ORAL
  Filled 2015-04-30: qty 1

## 2015-04-30 MED ORDER — IBUPROFEN 200 MG PO TABS
200.0000 mg | ORAL_TABLET | Freq: Four times a day (QID) | ORAL | Status: DC | PRN
  Administered 2015-05-01: 200 mg via ORAL
  Filled 2015-04-30: qty 1

## 2015-04-30 MED ORDER — ONDANSETRON HCL 4 MG/2ML IJ SOLN
4.0000 mg | Freq: Four times a day (QID) | INTRAMUSCULAR | Status: DC | PRN
  Filled 2015-04-30: qty 2

## 2015-04-30 MED ORDER — LIDOCAINE HCL (CARDIAC) 20 MG/ML IV SOLN
INTRAVENOUS | Status: AC
  Filled 2015-04-30: qty 15

## 2015-04-30 MED ORDER — DEXTROSE 5 % IV SOLN
1.5000 g | Freq: Two times a day (BID) | INTRAVENOUS | Status: AC
  Administered 2015-04-30 – 2015-05-01 (×2): 1.5 g via INTRAVENOUS
  Filled 2015-04-30 (×3): qty 1.5

## 2015-04-30 MED ORDER — FENTANYL CITRATE (PF) 100 MCG/2ML IJ SOLN
INTRAMUSCULAR | Status: AC
  Filled 2015-04-30: qty 2

## 2015-04-30 MED ORDER — PHENYLEPHRINE 40 MCG/ML (10ML) SYRINGE FOR IV PUSH (FOR BLOOD PRESSURE SUPPORT)
PREFILLED_SYRINGE | INTRAVENOUS | Status: AC
  Filled 2015-04-30: qty 20

## 2015-04-30 MED ORDER — ONDANSETRON HCL 4 MG/2ML IJ SOLN
INTRAMUSCULAR | Status: DC | PRN
  Administered 2015-04-30: 4 mg via INTRAVENOUS

## 2015-04-30 MED ORDER — GLYCOPYRROLATE 0.2 MG/ML IJ SOLN
INTRAMUSCULAR | Status: AC
  Filled 2015-04-30: qty 3

## 2015-04-30 MED ORDER — ALBUTEROL SULFATE (2.5 MG/3ML) 0.083% IN NEBU: 3.0000 mL | INHALATION_SOLUTION | Freq: Four times a day (QID) | RESPIRATORY_TRACT | Status: DC | PRN

## 2015-04-30 MED ORDER — PROPOFOL 10 MG/ML IV BOLUS
INTRAVENOUS | Status: AC
  Filled 2015-04-30: qty 20

## 2015-04-30 MED ORDER — ARTIFICIAL TEARS OP OINT
TOPICAL_OINTMENT | OPHTHALMIC | Status: AC
  Filled 2015-04-30: qty 3.5

## 2015-04-30 MED ORDER — SUCCINYLCHOLINE CHLORIDE 20 MG/ML IJ SOLN
INTRAMUSCULAR | Status: AC
  Filled 2015-04-30: qty 2

## 2015-04-30 MED ORDER — PROTAMINE SULFATE 10 MG/ML IV SOLN
INTRAVENOUS | Status: AC
Start: 2015-04-30 — End: 2015-04-30
  Filled 2015-04-30: qty 5

## 2015-04-30 MED ORDER — HEPARIN SODIUM (PORCINE) 1000 UNIT/ML IJ SOLN
INTRAMUSCULAR | Status: AC
  Filled 2015-04-30: qty 1

## 2015-04-30 MED ORDER — ROCURONIUM BROMIDE 50 MG/5ML IV SOLN
INTRAVENOUS | Status: AC
  Filled 2015-04-30: qty 2

## 2015-04-30 MED ORDER — NEOSTIGMINE METHYLSULFATE 10 MG/10ML IV SOLN
INTRAVENOUS | Status: AC
  Filled 2015-04-30: qty 1

## 2015-04-30 MED ORDER — GUAIFENESIN-DM 100-10 MG/5ML PO SYRP: 15.0000 mL | ORAL_SOLUTION | ORAL | Status: DC | PRN

## 2015-04-30 MED ORDER — ASPIRIN EC 81 MG PO TBEC
81.0000 mg | DELAYED_RELEASE_TABLET | Freq: Two times a day (BID) | ORAL | Status: DC
  Administered 2015-04-30 – 2015-05-01 (×2): 81 mg via ORAL
  Filled 2015-04-30 (×2): qty 1

## 2015-04-30 MED ORDER — MAGNESIUM SULFATE 2 GM/50ML IV SOLN
2.0000 g | Freq: Every day | INTRAVENOUS | Status: DC | PRN
Start: 2015-04-30 — End: 2015-05-01

## 2015-04-30 MED ORDER — PROTAMINE SULFATE 10 MG/ML IV SOLN
INTRAVENOUS | Status: DC | PRN
  Administered 2015-04-30: 50 mg via INTRAVENOUS

## 2015-04-30 MED ORDER — ALUM & MAG HYDROXIDE-SIMETH 200-200-20 MG/5ML PO SUSP
15.0000 mL | ORAL | Status: DC | PRN
Start: 2015-04-30 — End: 2015-05-01

## 2015-04-30 MED ORDER — PROPOFOL 10 MG/ML IV BOLUS
INTRAVENOUS | Status: DC | PRN
  Administered 2015-04-30: 30 mg via INTRAVENOUS
  Administered 2015-04-30: 110 mg via INTRAVENOUS

## 2015-04-30 MED ORDER — POTASSIUM CHLORIDE CRYS ER 20 MEQ PO TBCR: 20.0000 meq | EXTENDED_RELEASE_TABLET | Freq: Every day | ORAL | Status: DC | PRN

## 2015-04-30 MED ORDER — IOPAMIDOL (ISOVUE-300) INJECTION 61%
INTRAVENOUS | Status: AC
  Filled 2015-04-30: qty 50

## 2015-04-30 MED ORDER — 0.9 % SODIUM CHLORIDE (POUR BTL) OPTIME
TOPICAL | Status: DC | PRN
  Administered 2015-04-30: 1000 mL

## 2015-04-30 MED ORDER — MIDAZOLAM HCL 5 MG/5ML IJ SOLN
INTRAMUSCULAR | Status: DC | PRN
  Administered 2015-04-30: 2 mg via INTRAVENOUS

## 2015-04-30 MED ORDER — MIDAZOLAM HCL 2 MG/2ML IJ SOLN
INTRAMUSCULAR | Status: AC
  Filled 2015-04-30: qty 2

## 2015-04-30 MED ORDER — CEFAZOLIN SODIUM-DEXTROSE 2-3 GM-% IV SOLR
INTRAVENOUS | Status: DC | PRN
  Administered 2015-04-30: 2 g via INTRAVENOUS

## 2015-04-30 MED ORDER — HEPARIN SODIUM (PORCINE) 1000 UNIT/ML IJ SOLN
INTRAMUSCULAR | Status: DC | PRN
  Administered 2015-04-30: 6000 [IU] via INTRAVENOUS

## 2015-04-30 MED ORDER — SODIUM CHLORIDE 0.9 % IV SOLN
INTRAVENOUS | Status: DC | PRN
  Administered 2015-04-30: 19:00:00

## 2015-04-30 MED ORDER — EPHEDRINE SULFATE 50 MG/ML IJ SOLN
INTRAMUSCULAR | Status: AC
  Filled 2015-04-30: qty 1

## 2015-04-30 MED ORDER — FENTANYL CITRATE (PF) 250 MCG/5ML IJ SOLN
INTRAMUSCULAR | Status: AC
  Filled 2015-04-30: qty 5

## 2015-04-30 MED ORDER — HYDRALAZINE HCL 20 MG/ML IJ SOLN: 5.0000 mg | INTRAMUSCULAR | Status: DC | PRN

## 2015-04-30 MED ORDER — PHENYLEPHRINE HCL 10 MG/ML IJ SOLN
INTRAMUSCULAR | Status: DC | PRN
  Administered 2015-04-30 (×2): 80 ug via INTRAVENOUS
  Administered 2015-04-30: 40 ug via INTRAVENOUS

## 2015-04-30 MED ORDER — ONDANSETRON HCL 4 MG/2ML IJ SOLN
INTRAMUSCULAR | Status: AC
  Filled 2015-04-30: qty 2

## 2015-04-30 MED ORDER — FENTANYL CITRATE (PF) 250 MCG/5ML IJ SOLN
INTRAMUSCULAR | Status: DC | PRN
  Administered 2015-04-30 (×2): 100 ug via INTRAVENOUS
  Administered 2015-04-30: 50 ug via INTRAVENOUS

## 2015-04-30 SURGICAL SUPPLY — 55 items
AGENT HMST SPONGE THK3/8 (HEMOSTASIS) ×1
BANDAGE ESMARK 6X9 LF (GAUZE/BANDAGES/DRESSINGS) IMPLANT
BNDG CMPR 9X6 STRL LF SNTH (GAUZE/BANDAGES/DRESSINGS)
BNDG ESMARK 6X9 LF (GAUZE/BANDAGES/DRESSINGS)
CANISTER SUCTION 2500CC (MISCELLANEOUS) ×2 IMPLANT
CANNULA VESSEL 3MM 2 BLNT TIP (CANNULA) ×1 IMPLANT
CATH EMB 3FR 80CM (CATHETERS) ×1 IMPLANT
CATH EMB 4FR 80CM (CATHETERS) ×1 IMPLANT
CATH EMB 5FR 80CM (CATHETERS) IMPLANT
CLIP TI MEDIUM 24 (CLIP) ×2 IMPLANT
CLIP TI MEDIUM 6 (CLIP) ×1 IMPLANT
CLIP TI WIDE RED SMALL 24 (CLIP) ×2 IMPLANT
CLIP TI WIDE RED SMALL 6 (CLIP) ×1 IMPLANT
CUFF TOURNIQUET SINGLE 24IN (TOURNIQUET CUFF) IMPLANT
CUFF TOURNIQUET SINGLE 34IN LL (TOURNIQUET CUFF) IMPLANT
CUFF TOURNIQUET SINGLE 44IN (TOURNIQUET CUFF) IMPLANT
DECANTER SPIKE VIAL GLASS SM (MISCELLANEOUS) IMPLANT
DRAIN SNY 10X20 3/4 PERF (WOUND CARE) IMPLANT
DRAPE X-RAY CASS 24X20 (DRAPES) IMPLANT
DRSG COVADERM 4X8 (GAUZE/BANDAGES/DRESSINGS) IMPLANT
ELECT REM PT RETURN 9FT ADLT (ELECTROSURGICAL) ×2
ELECTRODE REM PT RTRN 9FT ADLT (ELECTROSURGICAL) ×1 IMPLANT
EVACUATOR SILICONE 100CC (DRAIN) IMPLANT
GLOVE BIO SURGEON STRL SZ7.5 (GLOVE) ×3 IMPLANT
GLOVE BIOGEL M 6.5 STRL (GLOVE) ×1 IMPLANT
GLOVE BIOGEL PI IND STRL 7.5 (GLOVE) IMPLANT
GLOVE BIOGEL PI INDICATOR 7.5 (GLOVE) ×1
GOWN STRL REUS W/ TWL LRG LVL3 (GOWN DISPOSABLE) ×3 IMPLANT
GOWN STRL REUS W/TWL LRG LVL3 (GOWN DISPOSABLE) ×6
HEMOSTAT SPONGE AVITENE ULTRA (HEMOSTASIS) ×1 IMPLANT
KIT BASIN OR (CUSTOM PROCEDURE TRAY) ×2 IMPLANT
KIT ROOM TURNOVER OR (KITS) ×2 IMPLANT
LIQUID BAND (GAUZE/BANDAGES/DRESSINGS) ×1 IMPLANT
LOOP VESSEL MINI RED (MISCELLANEOUS) ×1 IMPLANT
NS IRRIG 1000ML POUR BTL (IV SOLUTION) ×4 IMPLANT
PACK PERIPHERAL VASCULAR (CUSTOM PROCEDURE TRAY) ×2 IMPLANT
PAD ARMBOARD 7.5X6 YLW CONV (MISCELLANEOUS) ×4 IMPLANT
PADDING CAST COTTON 6X4 STRL (CAST SUPPLIES) IMPLANT
PATCH VASC XENOSURE 1CMX6CM (Vascular Products) ×2 IMPLANT
PATCH VASC XENOSURE 1X6 (Vascular Products) IMPLANT
SET COLLECT BLD 21X3/4 12 (NEEDLE) IMPLANT
SPONGE SURGIFOAM ABS GEL 100 (HEMOSTASIS) IMPLANT
STAPLER VISISTAT 35W (STAPLE) IMPLANT
STOPCOCK 4 WAY LG BORE MALE ST (IV SETS) IMPLANT
SUT PROLENE 5 0 C 1 24 (SUTURE) ×3 IMPLANT
SUT PROLENE 6 0 CC (SUTURE) ×4 IMPLANT
SUT PROLENE 7 0 BV 1 (SUTURE) ×1 IMPLANT
SUT VIC AB 2-0 CTX 36 (SUTURE) ×2 IMPLANT
SUT VIC AB 3-0 SH 27 (SUTURE) ×2
SUT VIC AB 3-0 SH 27X BRD (SUTURE) ×1 IMPLANT
SYR 3ML LL SCALE MARK (SYRINGE) ×3 IMPLANT
TRAY FOLEY W/METER SILVER 16FR (SET/KITS/TRAYS/PACK) ×2 IMPLANT
TUBING EXTENTION W/L.L. (IV SETS) IMPLANT
UNDERPAD 30X30 INCONTINENT (UNDERPADS AND DIAPERS) ×2 IMPLANT
WATER STERILE IRR 1000ML POUR (IV SOLUTION) ×2 IMPLANT

## 2015-04-30 NOTE — Anesthesia Preprocedure Evaluation (Addendum)
Anesthesia Evaluation  Patient identified by MRN, date of birth, ID band Patient awake    Reviewed: Allergy & Precautions, H&P , NPO status , Patient's Chart, lab work & pertinent test results  Airway Mallampati: III  TM Distance: >3 FB Neck ROM: Full    Dental no notable dental hx. (+) Poor Dentition, Dental Advisory Given   Pulmonary asthma , Current Smoker,    Pulmonary exam normal breath sounds clear to auscultation       Cardiovascular hypertension, + Peripheral Vascular Disease   Rhythm:Regular Rate:Normal     Neuro/Psych  Headaches, PSYCHIATRIC DISORDERS Anxiety Depression CVA    GI/Hepatic negative GI ROS, Neg liver ROS,   Endo/Other  negative endocrine ROS  Renal/GU negative Renal ROS  negative genitourinary   Musculoskeletal  (+) Fibromyalgia -  Abdominal   Peds  Hematology negative hematology ROS (+)   Anesthesia Other Findings   Reproductive/Obstetrics negative OB ROS                           Anesthesia Physical Anesthesia Plan  ASA: III  Anesthesia Plan: General   Post-op Pain Management:    Induction: Intravenous  Airway Management Planned: Oral ETT  Additional Equipment:   Intra-op Plan:   Post-operative Plan: Extubation in OR  Informed Consent: I have reviewed the patients History and Physical, chart, labs and discussed the procedure including the risks, benefits and alternatives for the proposed anesthesia with the patient or authorized representative who has indicated his/her understanding and acceptance.   Dental advisory given  Plan Discussed with: CRNA, Anesthesiologist and Surgeon  Anesthesia Plan Comments:        Anesthesia Quick Evaluation

## 2015-04-30 NOTE — Transfer of Care (Signed)
Immediate Anesthesia Transfer of Care Note  Patient: Kimberly [name]  Procedure(s) Performed: Procedure(s): EMBOLECTOMY BRACHIAL, Radial and ulnar arteries with patch angioplasty of brachial artery. (Left)  Patient Location: PACU  Anesthesia Type:General  Level of Consciousness: awake  Airway & Oxygen Therapy: Patient Spontanous Breathing and Patient connected to face mask oxygen  Post-op Assessment: Report given to RN and Post -op Vital signs reviewed and stable  Post vital signs: Reviewed and stable  Last Vitals:  Filed Vitals:   04/30/15 1426 04/30/15 1802  BP: 124/88 147/68  Pulse: 71 66  Temp: 36.6 C   Resp: 17     Complications: No apparent anesthesia complications

## 2015-04-30 NOTE — ED Notes (Addendum)
She c/o L hand numbness and "turning white" since she was here to have a stent placed on wednesday. She is unable to palpate her pulse on this arm. L radial pulse is faint and weaker than R radial pulse. Cap refill <2, skin w/d to L hand.   She is alert, breathing easily.

## 2015-04-30 NOTE — Op Note (Signed)
Procedure: Brachial ulnar and radial artery thrombectomy  Preoperative diagnosis: Acute ischemia left hand  Postoperative diagnosis: Same  Anesthesia: Gen.  Assistant: Surgical tech  Operative findings: #1 acute thrombus brachial artery, bovine pericardial patch  Operative details: After obtaining informed consent, the patient was taken to the operating room. The patient was placed in supine position on the operating room table. After induction of general anesthesia and placement of a laryngeal mask, the patient's entire left upper extremity was prepped and draped in the usual sterile fashion. Next, a longitudinal incision was made on the left arm at the level of the antecubital crease. The incision was carried down through the subcutaneous tissues down to the level of the bicipital aponeurosis. This was taken down with cautery. The brachial artery was dissected free circumferentially. There was visible thrombus within the brachial artery. There was no pulse within it.  Dissection was carried down to the bifurcation of the brachial artery. The radial and ulnar arteries were dissected free circumferentially and vessel loops were placed around these.  She was given a bolus of 6000 units of intravenous heparin. After 2 minutes of circulation time, Vesseloops were used to control the proximal brachial artery as well as the radial and ulnar arteries. A longitudinal arteriotomy was made just above the brachial bifurcation. There was visible thrombus within the artery and some fracture of the posterior intimal lining. The thrombus was removed under direct vision. The artery was about 3 mm diameter.  A #3 Fogarty catheter was then passed proximally up the brachial artery with multiple to make sure that all thrombotic material was removed and there was brisk arterial inflow. 2 clean passes were obtained. This was then reoccluded with a vessel loop. I then proceeded to pass a #3 Fogarty catheters down the radial  and ulnar arteries.  No real material was removed from either of these. Catheters were passed down until the catheter was at least 40 cm and 2 clean passes were obtained. Each branch was then thoroughly flushed with heparinized saline. I dissected out an harvested an adjacent segment of basilic vein but this had thrombus within it and some narrowing so I elected to patch the artery with a bovine pericardial patch. The posterior intimal fracture was tacked with a single 7 0 prolene suture.  The arteriotomy was then repaired using the patch and a running 6 0 prolene. At completion of the anastomosis everything was forebled backbled and thoroughly flushed. The anastomosis was secured.  Vesseloops were released and there was biphasic radial Doppler flow. There is monophasic ulnar Doppler flow. There was good flow in the brachial artery. There was good Doppler flow in the proximal radial and ulnar arteries. At this point hemostasis was obtained. 50 mg of protamine was given.  Subcutaneous tissues were reapproximated using running 3-0 Vicryl suture. The skin was closed with a 4 0 Vicryl subcuticular stitch. Dermabond was applied. The patient tolerated the procedure well and there were no complications. Instrument sponge and needle counts were correct at the end of the case. Patient was taken to recovery in stable condition.  Ruta Hinds, MD [facility] [place] Office: [phone] Pager: [phone]

## 2015-04-30 NOTE — ED Notes (Signed)
Consent for thrombectomy signed by pt at this time.

## 2015-04-30 NOTE — Anesthesia Procedure Notes (Signed)
Procedure Name: Intubation Date/Time: 04/30/2015 7:01 PM Performed by: Maude Leriche D Pre-anesthesia Checklist: Patient identified, Emergency Drugs available, Suction available, Patient being monitored and Timeout performed Patient Re-evaluated:Patient Re-evaluated prior to inductionOxygen Delivery Method: Circle system utilized Preoxygenation: Pre-oxygenation with 100% oxygen Intubation Type: IV induction Ventilation: Mask ventilation without difficulty Laryngoscope Size: Glidescope (Elective Glidescope) Grade View: Grade I Tube type: Oral Tube size: 7.0 mm Number of attempts: 1 Airway Equipment and Method: Stylet and Video-laryngoscopy Placement Confirmation: ETT inserted through vocal cords under direct vision,  positive ETCO2 and breath sounds checked- equal and bilateral Secured at: 21 cm Tube secured with: Tape Dental Injury: Teeth and Oropharynx as per pre-operative assessment

## 2015-04-30 NOTE — Progress Notes (Signed)
Pt in PACU.  2+ left radial pulse. No hematoma. To stepdown tonight.  Most likely d/c home tomorrow  Ruta Hinds, MD [facility] [place] Office: [phone] Pager: [phone]

## 2015-04-30 NOTE — Anesthesia Postprocedure Evaluation (Signed)
Anesthesia Post Note  Patient: Kimberly [name]  Procedure(s) Performed: Procedure(s) (LRB): EMBOLECTOMY BRACHIAL, Radial and ulnar arteries with patch angioplasty of brachial artery. (Left)  Patient location during evaluation: PACU Anesthesia Type: General Level of consciousness: awake and alert Pain management: pain level controlled Vital Signs Assessment: post-procedure vital signs reviewed and stable Respiratory status: spontaneous breathing, nonlabored ventilation, respiratory function stable and patient connected to nasal cannula oxygen Cardiovascular status: blood pressure returned to baseline and stable Postop Assessment: no signs of nausea or vomiting Anesthetic complications: no    Last Vitals:  Filed Vitals:   04/30/15 2100 04/30/15 2134  BP: 154/79 137/126  Pulse: 85 72  Temp: 36.7 C 36.5 C  Resp: 18     Last Pain:  Filed Vitals:   04/30/15 2136  PainSc: 9                  Caroleann Casler,W. EDMOND

## 2015-04-30 NOTE — Progress Notes (Addendum)
VASCULAR LAB PRELIMINARY  PRELIMINARY  PRELIMINARY  PRELIMINARY  Left upper extremity arterial duplex completed.    Preliminary report:  There appears to be thrombus noted in the left brachial artery beginning above AC and extending to upper arm.  Radial, Ulnar, Palmar, and Axillary arteries appear to be thrombus free.   Called findings to Dr. Oneida Alar.   Kempton Milne, RVT 04/30/2015, 5:37 PM

## 2015-04-30 NOTE — Consult Note (Signed)
Patient name: Kimberly [name] MRN: [id number] DOB: 1950-03-08 Sex: female  REASON FOR CONSULT: left hand numbness  HPI: Kimberly [name] is a 65 y.o. female s/p left subclavian stent by Dr Trula Slade several days ago.  This was done via left brachial approach.  She complains of intermittent numbness in the left forearm into the left hand that is worse in the morning when she first wakes up.  It does resolve but can recur 2-3 times per day.  She was asymptomatic in the left arm prior to the procedure.  Other medical problems include asthma, hyperlipidemia, anxiety, hypertension all of which have been stable.  She is on plavix and aspirin.  She continues to smoke.  She states that currently the numbness has resolved.  Past Medical History  Diagnosis Date  . Asthma   . Hyperlipidemia   . History of lead poisoning 1977  . Shortness of breath   . Peripheral vascular disease (HCC)     claudication  right greater than left  femoral artery  . Anxiety   . Blood transfusion   . Migraine   . 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 Small bowel obstruction, partial ([place]) 04/08/11  . Angina     March 2013  . Depression   . Uterine cancer (HCC)     Pre cervical  . Fibromyalgia   . Anemia   . Stroke ([place])   . Hypertension    Past Surgical History  Procedure Laterality Date  . Abdominal angiogram    . Aorta - bilateral femoral artery bypass graft  02/26/2011    Procedure: AORTA BIFEMORAL BYPASS GRAFT;  Surgeon: Theotis Burrow, MD;  Location: [facility] OR;  Service: Vascular;  Laterality: N/A;  . Pr vein bypass graft,aorto-fem-pop  02/26/11  . Laparotomy  04/10/2011    Procedure: EXPLORATORY LAPAROTOMY;  Surgeon: Shann Medal, MD;  Location: [place];  Service: General;  Laterality: N/A;  ENTEROLYSIS OF ADHESIONS  . Endarterectomy  06/13/2011    Procedure: ENDARTERECTOMY CAROTID;  Surgeon: Serafina Mitchell, MD;  Location: [facility];  Service: Vascular;  Laterality: Left;  Left carotid artery endarterectomy  with vascu-guard patch angioplasty  . Lower extremity angiogram Left 06/24/12    Left stent intervention, left subclavian  . Cholecystectomy N/A 12/08/2012    Procedure: LAPAROSCOPIC CHOLECYSTECTOMY WITH INTRAOPERATIVE CHOLANGIOGRAM;  Surgeon: Haywood Lasso, MD;  Location: [place];  Service: General;  Laterality: N/A;  . Abdominal hysterectomy  2008  . Endarterectomy Right 08/13/2013    Procedure: RIGHT  CAROTID ENDARTERECTOMY CAROTID WITH BOVINE PERICARDIAL PATCH ANGIOPLASTY;  Surgeon: Serafina Mitchell, MD;  Location: [place] OR;  Service: Vascular;  Laterality: Right;  . Carotid endarterectomy Left 06/13/2011    Left  CEA  . Carotid endarterectomy Right 08-13-13    CEA  . Abdominal angiogram N/A 02/05/2011    Procedure: ABDOMINAL ANGIOGRAM;  Surgeon: Serafina Mitchell, MD;  Location: [facility] CATH LAB;  Service: Cardiovascular;  Laterality: N/A;  . Arch aortogram N/A 06/24/2012    Procedure: ARCH AORTOGRAM;  Surgeon: Serafina Mitchell, MD;  Location: [facility] CATH LAB;  Service: Cardiovascular;  Laterality: N/A;  . Bilateral upper extremity angiogram Left 08/12/2013    Procedure: UPPER EXTREMITY ANGIOGRAM;  Surgeon: Serafina Mitchell, MD;  Location: [facility] CATH LAB;  Service: Cardiovascular;  Laterality: Left;  . Esophagogastroduodenoscopy (egd) with propofol N/A 06/16/2014    Procedure: ESOPHAGOGASTRODUODENOSCOPY (EGD) WITH PROPOFOL;  Surgeon: Garlan Fair, MD;  Location:  [facility];  Service: Endoscopy;  Laterality: N/A;  . Colonoscopy with propofol N/A 06/16/2014    Procedure: COLONOSCOPY WITH PROPOFOL;  Surgeon: Garlan Fair, MD;  Location: [facility];  Service: Endoscopy;  Laterality: N/A;  . Peripheral vascular catheterization N/A 04/26/2015    Procedure: Aortic Arch Angiography;  Surgeon: Serafina Mitchell, MD;  Location: [place] CV LAB;  Service: Cardiovascular;  Laterality: N/A;  . Peripheral vascular catheterization  04/26/2015    Procedure: Peripheral Vascular Intervention;  Surgeon: Serafina Mitchell, MD;   Location: [place] CV LAB;  Service: Cardiovascular;;  lt illiac    Family History  Problem Relation Age of Onset  . Cancer Mother     breast, colon, ovarian  . Anesthesia problems Neg Hx   . Cancer Brother 50    lung  . Hyperlipidemia Brother   . Cancer Other   . Stroke Other   . Cancer Maternal Grandmother     ovarian    SOCIAL HISTORY: Social History   Social History  . Marital Status: Legally Separated    Spouse Name: N/A  . Number of Children: N/A  . Years of Education: N/A   Occupational History  . Not on file.   Social History Main Topics  . Smoking status: Current Some Day Smoker -- 1.00 packs/day for 45 years    Types: Cigarettes  . Smokeless tobacco: Never Used     Comment: pt states that she smokes about 6 cigs per day  . Alcohol Use: No  . Drug Use: No     Comment: History of Cocaine and marijuana abuse: "not in many years" (04/08/11  . Sexual Activity: Not Currently   Other Topics Concern  . Not on file   Social History Narrative   Pt lives at home with her 2 grandsons. She has been separated for 32yrs, has two children, and has a 12th grade education level. She drinks 3 cups of coffee daily.    Allergies  Allergen Reactions  . Codeine Nausea And Vomiting    Dizziness, headache, has passed out  . Hydrocodone Nausea And Vomiting and Other (See Comments)    Headache and dizziness  . Other Shortness Of Breath    Allergy to cologne  . Oxycodone Nausea And Vomiting and Other (See Comments)    Dizziness   . Percocet [Oxycodone-Acetaminophen] Nausea And Vomiting  . Vicodin [Hydrocodone-Acetaminophen] Nausea And Vomiting    Headache and dizziness    No current facility-administered medications for this encounter.   Current Outpatient Prescriptions  Medication Sig Dispense Refill  . albuterol (PROVENTIL HFA;VENTOLIN HFA) 108 (90 Base) MCG/ACT inhaler Inhale 1-2 puffs into the lungs every 6 (six) hours as needed for wheezing or shortness of breath.     Marland Kitchen aspirin EC 81 MG tablet Take 81 mg by mouth daily.    . clopidogrel (PLAVIX) 75 MG tablet Take 1 tablet (75 mg total) by mouth at bedtime. 90 tablet 3  . ibuprofen (ADVIL,MOTRIN) 200 MG tablet Take 200 mg by mouth every 6 (six) hours as needed (pain).     . polyethylene glycol (MIRALAX / GLYCOLAX) packet Take 17 g by mouth daily as needed (constipation).    . traMADol (ULTRAM) 50 MG tablet Take 50 mg by mouth every 6 (six) hours as needed for severe pain.      ROS:   She denies shortness of breath or chest pain.   Physical Examination  Filed Vitals:   04/30/15 1426  BP: 124/88  Pulse: 71  Temp: 97.8 F (36.6 C)  TempSrc: Oral  Resp: 17  SpO2: 100%    There is no weight on file to calculate BMI.  General:  Alert and oriented, no acute distress HEENT: Normal Neck: No bruit or JVD Pulmonary: Clear to auscultation bilaterally Cardiac: Regular Rate and Rhythm  Skin: No rash, 5 cm area of edema near stick site antecubital region, no tense hematoma Extremity Pulses:  2+ radial, brachial right side, 2+ axillary, 1+ left radial pulse, hand pink and warm bilaterally,  Musculoskeletal: No deformity or edema other than mentioned above  Neurologic: Upper and lower extremity motor 5/5 and symmetric specifically no motor deficits subjectively no sensory deficit currently  ASSESSMENT:  Left hand intermittent numbness, small but probably not clinically relevant hematoma antecubital region.  Potentially some median nerve trauma. Less likely brachial artery thrombosis   PLAN:  Left upper extremity arterial duplex.  If no arterial thrombosis supportive care and follow up with Dr Trula Slade.   Ruta Hinds, MD [facility] Office: [phone] Pager: [phone]

## 2015-04-30 NOTE — ED Provider Notes (Signed)
CSN: [id number]     Arrival date & time 04/30/15  1417 History   First MD Initiated Contact with Patient 04/30/15 1500     Chief Complaint  Patient presents with  . Numbness      HPI  Vision presents for evaluation with complaint of her left hand dominant turning white. Had a brachial approach less subclavian stent placed by Dr. Trula Slade 5 days ago. States she feels numb in most of her left arm and her states her hand feels fatigued and white.  Past Medical History  Diagnosis Date  . Asthma   . Hyperlipidemia   . History of lead poisoning 1977  . Shortness of breath   . Peripheral vascular disease (HCC)     claudication  right greater than left  femoral artery  . Anxiety   . Blood transfusion   . Migraine   . Subclavian artery disease ([place])     left  . Small bowel obstruction, partial ([place]) 04/08/11  . Angina     March 2013  . Depression   . Uterine cancer (HCC)     Pre cervical  . Fibromyalgia   . Anemia   . Stroke ([place])   . Hypertension    Past Surgical History  Procedure Laterality Date  . Abdominal angiogram    . Aorta - bilateral femoral artery bypass graft  02/26/2011    Procedure: AORTA BIFEMORAL BYPASS GRAFT;  Surgeon: Theotis Burrow, MD;  Location: [place] OR;  Service: Vascular;  Laterality: N/A;  . Pr vein bypass graft,aorto-fem-pop  02/26/11  . Laparotomy  04/10/2011    Procedure: EXPLORATORY LAPAROTOMY;  Surgeon: Shann Medal, MD;  Location: [place];  Service: General;  Laterality: N/A;  ENTEROLYSIS OF ADHESIONS  . Endarterectomy  06/13/2011    Procedure: ENDARTERECTOMY CAROTID;  Surgeon: Serafina Mitchell, MD;  Location: [facility] OR;  Service: Vascular;  Laterality: Left;  Left carotid artery endarterectomy with vascu-guard patch angioplasty  . Lower extremity angiogram Left 06/24/12    Left stent intervention, left subclavian  . Cholecystectomy N/A 12/08/2012    Procedure: LAPAROSCOPIC CHOLECYSTECTOMY WITH INTRAOPERATIVE CHOLANGIOGRAM;  Surgeon: Haywood Lasso, MD;   Location: [place];  Service: General;  Laterality: N/A;  . Abdominal hysterectomy  2008  . Endarterectomy Right 08/13/2013    Procedure: RIGHT  CAROTID ENDARTERECTOMY CAROTID WITH BOVINE PERICARDIAL PATCH ANGIOPLASTY;  Surgeon: Serafina Mitchell, MD;  Location: [place] OR;  Service: Vascular;  Laterality: Right;  . Carotid endarterectomy Left 06/13/2011    Left  CEA  . Carotid endarterectomy Right 08-13-13    CEA  . Abdominal angiogram N/A 02/05/2011    Procedure: ABDOMINAL ANGIOGRAM;  Surgeon: Serafina Mitchell, MD;  Location: [facility] CATH LAB;  Service: Cardiovascular;  Laterality: N/A;  . Arch aortogram N/A 06/24/2012    Procedure: ARCH AORTOGRAM;  Surgeon: Serafina Mitchell, MD;  Location: [facility] CATH LAB;  Service: Cardiovascular;  Laterality: N/A;  . Bilateral upper extremity angiogram Left 08/12/2013    Procedure: UPPER EXTREMITY ANGIOGRAM;  Surgeon: Serafina Mitchell, MD;  Location: [facility] CATH LAB;  Service: Cardiovascular;  Laterality: Left;  . Esophagogastroduodenoscopy (egd) with propofol N/A 06/16/2014    Procedure: ESOPHAGOGASTRODUODENOSCOPY (EGD) WITH PROPOFOL;  Surgeon: Garlan Fair, MD;  Location: [facility];  Service: Endoscopy;  Laterality: N/A;  . Colonoscopy with propofol N/A 06/16/2014    Procedure: COLONOSCOPY WITH PROPOFOL;  Surgeon: Garlan Fair, MD;  Location: [facility];  Service: Endoscopy;  Laterality: N/A;  . Peripheral vascular  catheterization N/A 04/26/2015    Procedure: Aortic Arch Angiography;  Surgeon: Serafina Mitchell, MD;  Location: [place] CV LAB;  Service: Cardiovascular;  Laterality: N/A;  . Peripheral vascular catheterization  04/26/2015    Procedure: Peripheral Vascular Intervention;  Surgeon: Serafina Mitchell, MD;  Location: [place] CV LAB;  Service: Cardiovascular;;  lt illiac   Family History  Problem Relation Age of Onset  . Cancer Mother     breast, colon, ovarian  . Anesthesia problems Neg Hx   . Cancer Brother 50    lung  . Hyperlipidemia Brother   . Cancer Other    . Stroke Other   . Cancer Maternal Grandmother     ovarian   Social History  Substance Use Topics  . Smoking status: Current Some Day Smoker -- 1.00 packs/day for 45 years    Types: Cigarettes  . Smokeless tobacco: Never Used     Comment: pt states that she smokes about 6 cigs per day  . Alcohol Use: No   OB History    No data available     Review of Systems  Constitutional: Negative for fever, chills, diaphoresis, appetite change and fatigue.  HENT: Negative for mouth sores, sore throat and trouble swallowing.   Eyes: Negative for visual disturbance.  Respiratory: Negative for cough, chest tightness, shortness of breath and wheezing.   Cardiovascular: Negative for chest pain.  Gastrointestinal: Negative for nausea, vomiting, abdominal pain, diarrhea and abdominal distention.  Endocrine: Negative for polydipsia, polyphagia and polyuria.  Genitourinary: Negative for dysuria, frequency and hematuria.  Musculoskeletal: Negative for gait problem.       Left upper extremity pain. No swelling. She notices a color change with movement.  Skin: Negative for color change, pallor and rash.  Neurological: Negative for dizziness, syncope, light-headedness and headaches.  Hematological: Does not bruise/bleed easily.  Psychiatric/Behavioral: Negative for behavioral problems and confusion.      Allergies  Codeine; Hydrocodone; Other; Oxycodone; Percocet; and Vicodin  Home Medications   Prior to Admission medications   Medication Sig Start Date End Date Taking? Authorizing Provider  albuterol (PROVENTIL HFA;VENTOLIN HFA) 108 (90 Base) MCG/ACT inhaler Inhale 1-2 puffs into the lungs every 6 (six) hours as needed for wheezing or shortness of breath.   Yes Historical Provider, MD  aspirin EC 81 MG tablet Take 81 mg by mouth 2 (two) times daily.    Yes Historical Provider, MD  clopidogrel (PLAVIX) 75 MG tablet Take 1 tablet (75 mg total) by mouth at bedtime. Patient taking differently:  Take 75 mg by mouth daily.  04/06/15  Yes Serafina Mitchell, MD  ibuprofen (ADVIL,MOTRIN) 200 MG tablet Take 200 mg by mouth every 6 (six) hours as needed for moderate pain (pain).    Yes Historical Provider, MD   BP 107/56 mmHg  Pulse 73  Temp(Src) 97.6 F (36.4 C) (Oral)  Resp 15  Ht 5\' 4"  (1.626 m)  Wt 150 lb (68.04 kg)  BMI 25.73 kg/m2  SpO2 94% Physical Exam  Constitutional: She is oriented to person, place, and time. She appears well-developed and well-nourished. No distress.  HENT:  Head: Normocephalic.  Eyes: Conjunctivae are normal. Pupils are equal, round, and reactive to light. No scleral icterus.  Neck: Normal range of motion. Neck supple. No thyromegaly present.  Cardiovascular: Normal rate and regular rhythm.  Exam reveals no gallop and no friction rub.   No murmur heard. A small area of ecchymosis and area of the left brachial artery consistent  with a recent puncture. No swelling or tenderness to suggest hematoma or pseudoaneurysm. She has a minister left radial pulse versus right. It is palpable, but 0-1+.  Pulmonary/Chest: Effort normal and breath sounds normal. No respiratory distress. She has no wheezes. She has no rales.  Abdominal: Soft. Bowel sounds are normal. She exhibits no distension. There is no tenderness. There is no rebound.  Musculoskeletal: Normal range of motion.  Neurological: She is alert and oriented to person, place, and time.  Skin: Skin is warm and dry. No rash noted.  Psychiatric: She has a normal mood and affect. Her behavior is normal.    ED Course  Procedures (including critical care time) Labs Review Labs Reviewed  MRSA PCR SCREENING  CBC  BASIC METABOLIC PANEL    Imaging Review No results found. I have personally reviewed and evaluated these images and lab results as part of my medical decision-making.   EKG Interpretation None      MDM   Final diagnoses:  Numbness of left hand    After completion of ultrasound showing  subclavian and brachial artery thrombosis patient is taken to the emergency room by Dr. fields for thrombectomy.    Tanna Furry, MD 04/30/15 2337

## 2015-04-30 NOTE — Progress Notes (Signed)
Duplex shows brachial artery occlusion Will take to OR for thrombectomy of brachial artery and repair  Procedure details risk benefits discussed with pt  Ruta Hinds, MD [facility] [place]: [phone] Pager: [phone]

## 2015-05-01 ENCOUNTER — Encounter (HOSPITAL_COMMUNITY): Payer: Self-pay | Admitting: Vascular Surgery

## 2015-05-01 LAB — BASIC METABOLIC PANEL
Anion gap: 11 (ref 5–15)
BUN: 11 mg/dL (ref 6–20)
CO2: 23 mmol/L (ref 22–32)
Calcium: 8 mg/dL — ABNORMAL LOW (ref 8.9–10.3)
Chloride: 104 mmol/L (ref 101–111)
Creatinine, Ser: 0.77 mg/dL (ref 0.44–1.00)
GFR calc Af Amer: >60 mL/min (ref >60)
GFR calc non Af Amer: >60 mL/min (ref >60)
Glucose, Bld: 98 mg/dL (ref 65–99)
Potassium: 3.3 mmol/L — ABNORMAL LOW (ref 3.5–5.1)
Sodium: 138 mmol/L (ref 135–145)

## 2015-05-01 LAB — CBC
HCT: 37 % (ref 36.0–46.0)
Hemoglobin: 12.4 g/dL (ref 12.0–15.0)
MCH: 32.9 pg (ref 26.0–34.0)
MCHC: 33.5 g/dL (ref 30.0–36.0)
MCV: 98.1 fL (ref 78.0–100.0)
Platelets: 171 10*3/uL (ref 150–400)
RBC: 3.77 MIL/uL — ABNORMAL LOW (ref 3.87–5.11)
RDW: 13.3 % (ref 11.5–15.5)
WBC: 8.9 10*3/uL (ref 4.0–10.5)

## 2015-05-01 LAB — MRSA PCR SCREENING: MRSA by PCR: NEGATIVE

## 2015-05-01 MED ORDER — ASPIRIN EC 81 MG PO TBEC: 81.0000 mg | DELAYED_RELEASE_TABLET | Freq: Every day | ORAL | Status: DC

## 2015-05-01 MED ORDER — IBUPROFEN 200 MG PO TABS
200.0000 mg | ORAL_TABLET | Freq: Once | ORAL | Status: AC
  Administered 2015-05-01: 200 mg via ORAL
  Filled 2015-05-01: qty 1

## 2015-05-01 MED ORDER — TRAMADOL HCL 50 MG PO TABS: 50.0000 mg | ORAL_TABLET | Freq: Four times a day (QID) | ORAL | Status: DC | PRN

## 2015-05-01 NOTE — Progress Notes (Signed)
  [facility] Progress Note  Subjective  - POD #1  No further numbness left hand. Says left arm feels sore from shoulder to fingers.   Objective Filed Vitals:   05/01/15 0400 05/01/15 0536  BP: 98/56 101/52  Pulse: 73 67  Temp:    Resp: 15 17    Intake/Output Summary (Last 24 hours) at 05/01/15 0739 Last data filed at 05/01/15 0600  Gross per 24 hour  Intake 1647.5 ml  Output    325 ml  Net 1322.5 ml   2+ left radial and ulnar pulses Left arm antecubital incision c/d/i, no hematoma Left arm with diffuse tenderness to touch, arm is soft to palpation.    Assessment/Planning: 65 y.o. female is s/p: brachial, ulnar and radial thrombectomy, s/p left subclavian artery stent 04/26/15  1 Day Post-Op   Palpable left radial and ulnar pulses.  Left arm is sore. No evidence of compartment syndrome.  Ok to d/c home today. Continue Plavix at discharge.   Alvia Grove 05/01/2015 7:39 AM --  Laboratory CBC    Component Value Date/Time   WBC 8.9 05/01/2015 0224   HGB 12.4 05/01/2015 0224   HCT 37.0 05/01/2015 0224   PLT 171 05/01/2015 0224    BMET    Component Value Date/Time   NA 138 05/01/2015 0224   K 3.3* 05/01/2015 0224   CL 104 05/01/2015 0224   CO2 23 05/01/2015 0224   GLUCOSE 98 05/01/2015 0224   BUN 11 05/01/2015 0224   CREATININE 0.77 05/01/2015 0224   CREATININE 0.86 03/21/2014 1327   CALCIUM 8.0* 05/01/2015 0224   GFRNONAA >60 05/01/2015 0224   GFRNONAA 73 03/21/2014 1327   GFRAA >60 05/01/2015 0224   GFRAA 84 03/21/2014 1327    COAG Lab Results  Component Value Date   INR 1.03 08/12/2013   INR 0.98 06/11/2011   INR 0.99 04/09/2011   No results found for: PTT  Antibiotics Anti-infectives    Start     Dose/Rate Route Frequency Ordered Stop   04/30/15 2115  cefUROXime (ZINACEF) 1.5 g in dextrose 5 % 50 mL IVPB     1.5 g 100 mL/hr over 30 Minutes Intravenous Every 12 hours 04/30/15 2038 05/01/15 2114       Virgina Jock, PA-C [facility] Office: [phone] Pager: [phone] 05/01/2015 7:39 AM     I agree with the above.  Her incision is without complication.  She is a palpable left radial pulse.  She is complaining of headache which I gave her ibuprofen.  She is stable for discharge later today.  Annamarie Major

## 2015-05-01 NOTE — Progress Notes (Signed)
Discharge instructions printed and discussed with patient. Prescription given to patient to fill at pharmacy of her choice.

## 2015-05-01 NOTE — Progress Notes (Signed)
Patient is in possession of belongings. Peripheral IV has been removed. Education has been provided on when to call MD regarding incision. Patient has called loved one for transportation. Will print discharge instructions and go over with patient.

## 2015-05-02 ENCOUNTER — Telehealth: Payer: Self-pay

## 2015-05-02 ENCOUNTER — Telehealth: Payer: Self-pay | Admitting: Surgery

## 2015-05-02 DIAGNOSIS — R200 Anesthesia of skin: Secondary | ICD-10-CM

## 2015-05-02 DIAGNOSIS — R202 Paresthesia of skin: Principal | ICD-10-CM

## 2015-05-02 DIAGNOSIS — Z959 Presence of cardiac and vascular implant and graft, unspecified: Secondary | ICD-10-CM

## 2015-05-02 NOTE — Telephone Encounter (Signed)
Phone call from pt.  Reported left arm is swollen, and has redness surrounding the incision, and going down into the forearm.  Stated there was a "yellow / bloody drainage this AM, but it has stopped."  Reported the incision is intact.  Reported the left arm has a "burning" sensation.  Denied pain in hand; reported the left hand is cool, and all 4 fingers are tingling.  Stated no tingling in left thumb.  Checked the finger nailbeds; reported they look pink.  Described her left hand as "speckled pink and white."   Stated she felt warm, but couldn't find her thermometer.  Discussed with Dr. Kellie Simmering.  Recommended to schedule for left UE Arterial Duplex tomorrow AM, and office appt.  Notified pt. Of plan for appt. Tomorrow; appt. Given for 10/30 AM.  Advised to go to the ER if symptoms worsen; ie: increased cold sensation/ worsening pain in left upper extremity, increased numbness, or discoloration of nailbeds.  Verb. Understanding.

## 2015-05-02 NOTE — Telephone Encounter (Signed)
-----   Message from Mena Goes, RN sent at 05/01/2015  9:07 AM EDT ----- Regarding: schedule   ----- Message -----    From: Alvia Grove, PA-C    Sent: 05/01/2015   7:43 AM      To: [facility] Charge Pool  S/p left brachial, radial and ulnar thrombectomy 04/30/15  F/u in 2 weeks with Dr. Trula Slade  Thanks Maudie Mercury

## 2015-05-02 NOTE — Telephone Encounter (Signed)
sched appt 5/8 at 8:30. Spoke to pt to inform them of appt.

## 2015-05-03 ENCOUNTER — Ambulatory Visit (HOSPITAL_COMMUNITY)
Admission: RE | Admit: 2015-05-03 | Discharge: 2015-05-03 | Disposition: A | Discharge status: Home / Self Care | Payer: Medicare Other | Source: Ambulatory Visit | Attending: Vascular Surgery | Admitting: Vascular Surgery

## 2015-05-03 ENCOUNTER — Encounter: Payer: Self-pay | Admitting: Family

## 2015-05-03 ENCOUNTER — Ambulatory Visit (INDEPENDENT_AMBULATORY_CARE_PROVIDER_SITE_OTHER): Payer: Medicare Other | Admitting: Family

## 2015-05-03 VITALS — BP 181/84 | HR 61 | Temp 97.1°F | Ht 64.0 in | Wt 139.9 lb

## 2015-05-03 DIAGNOSIS — Z959 Presence of cardiac and vascular implant and graft, unspecified: Secondary | ICD-10-CM

## 2015-05-03 DIAGNOSIS — I779 Disorder of arteries and arterioles, unspecified: Secondary | ICD-10-CM

## 2015-05-03 DIAGNOSIS — F17200 Nicotine dependence, unspecified, uncomplicated: Secondary | ICD-10-CM

## 2015-05-03 DIAGNOSIS — I10 Essential (primary) hypertension: Secondary | ICD-10-CM | POA: Diagnosis not present

## 2015-05-03 DIAGNOSIS — R202 Paresthesia of skin: Secondary | ICD-10-CM | POA: Diagnosis not present

## 2015-05-03 DIAGNOSIS — I6523 Occlusion and stenosis of bilateral carotid arteries: Secondary | ICD-10-CM

## 2015-05-03 DIAGNOSIS — Z720 Tobacco use: Secondary | ICD-10-CM

## 2015-05-03 DIAGNOSIS — I708 Atherosclerosis of other arteries: Secondary | ICD-10-CM

## 2015-05-03 DIAGNOSIS — M7989 Other specified soft tissue disorders: Secondary | ICD-10-CM

## 2015-05-03 DIAGNOSIS — R200 Anesthesia of skin: Secondary | ICD-10-CM

## 2015-05-03 DIAGNOSIS — E785 Hyperlipidemia, unspecified: Secondary | ICD-10-CM | POA: Diagnosis not present

## 2015-05-03 DIAGNOSIS — I771 Stricture of artery: Secondary | ICD-10-CM

## 2015-05-03 DIAGNOSIS — Z9889 Other specified postprocedural states: Secondary | ICD-10-CM

## 2015-05-03 MED ORDER — CEPHALEXIN 500 MG PO CAPS: 500.0000 mg | ORAL_CAPSULE | Freq: Three times a day (TID) | ORAL | Status: DC

## 2015-05-03 NOTE — Progress Notes (Signed)
[facility] [place]  Patient name: Kimberly [name] MRN: [id number] DOB: [birth year] Sex: female  REASON FOR VISIT: 3 days post op left arm arterial thrombectomies, c/o swelling of left arm  HPI: Kimberly [name] is a 65 y.o. female patient of Dr. Trula Slade who is status post aortobifemoral bypass graft on 02/26/2011. She went back for lysis of adhesions secondary to abdominal pain in April. In June 2013 she underwent left carotid endarterectomy for a high-grade asymptomatic stenosis. She then later presented with dizziness and temporary vision loss. On 06/24/2012 she had stenting of her left subclavian artery. 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 On 08/13/2013 she underwent right carotid endarterectomy for an asymptomatic right carotid stenosis. On 04/30/15 she underwent brachial, ulnar, and radial artery thrombectomy for aute ischemia of left hand.  She returns today after triage nurse received phone call yesterday with pt c/o swollen left arm with redness surrounding the incision, and going down into the forearm. Stated there was a "yellow / bloody drainage yesterday morning, but it has stopped." Reported the incision is intact. Reported the left arm had a "burning" sensation. Denied pain in hand; reported the left hand was cool, and all 4 fingers were tingling. Stated no tingling in left thumb. Checked the finger nailbeds; reported they look pink. Described her left hand as "speckled pink and white." Stated she felt warm, but couldn't find her thermometer.  Her symptoms of stroke in the past were bilateral partial loss of vision in 2014 for a couple of months right before the left subclavian artery stenting in June, 2014.  She currently has smoked 2 cigarettes in the last week, is trying to quit, started smoking about 45 years ago. She denies a history of DM. She  takes a daily 81 mg ASA and Plavix, she does not take a statin.    Past Medical History  Diagnosis Date  . Asthma   . Hyperlipidemia   . History of lead poisoning 1977  . Shortness of breath   . Peripheral vascular disease (HCC)     claudication  right greater than left  femoral artery  . Anxiety   . Blood transfusion   . Migraine   . Subclavian artery disease ([place])     left  . Small bowel obstruction, partial ([place]) 04/08/11  . Angina     March 2013  . Depression   . Uterine cancer (HCC)     Pre cervical  . Fibromyalgia   . Anemia   . Stroke ([place])   . Hypertension     Family History  Problem Relation Age of Onset  . Cancer Mother     breast, colon, ovarian  . Anesthesia problems Neg Hx   . Cancer Brother 50    lung  . Hyperlipidemia Brother   . Cancer Other   . Stroke Other   . Cancer Maternal Grandmother     ovarian    SOCIAL HISTORY: Social History  Substance Use Topics  . Smoking status: Current Some Day Smoker -- 1.00 packs/day for 45 years    Types: Cigarettes  . Smokeless tobacco: Never Used     Comment: pt states that she smokes about 6 cigs per day  . Alcohol Use: No    Allergies  Allergen Reactions  . Codeine Nausea And Vomiting    Dizziness, headache, has passed out  . Hydrocodone Nausea And Vomiting and Other (  See Comments)    Headache and dizziness  . Other Shortness Of Breath    Allergy to cologne/perfume base  . Oxycodone Nausea And Vomiting and Other (See Comments)    Dizziness   . Percocet [Oxycodone-Acetaminophen] Nausea And Vomiting  . Vicodin [Hydrocodone-Acetaminophen] Nausea And Vomiting    Headache and dizziness    Current Outpatient Prescriptions  Medication Sig Dispense Refill  . albuterol (PROVENTIL HFA;VENTOLIN HFA) 108 (90 Base) MCG/ACT inhaler Inhale 1-2 puffs into the lungs every 6 (six) hours as needed for wheezing or shortness of breath.    Marland Kitchen aspirin EC 81 MG tablet Take 1 tablet (81 mg total) by mouth daily. 30  tablet 0  . clopidogrel (PLAVIX) 75 MG tablet Take 1 tablet (75 mg total) by mouth at bedtime. (Patient taking differently: Take 75 mg by mouth daily. ) 90 tablet 3  . ibuprofen (ADVIL,MOTRIN) 200 MG tablet Take 200 mg by mouth every 6 (six) hours as needed for moderate pain (pain).     . traMADol (ULTRAM) 50 MG tablet Take 1 tablet (50 mg total) by mouth every 6 (six) hours as needed. 15 tablet 0  . cephALEXin (KEFLEX) 500 MG capsule Take 1 capsule (500 mg total) by mouth 3 (three) times daily. 21 capsule 0   No current facility-administered medications for this visit.    REVIEW OF SYSTEMS:  [X]  denotes positive finding, [ ]  denotes negative finding Cardiac  Comments:  Chest pain or chest pressure: no   Shortness of breath upon exertion:    Short of breath when lying flat:    Irregular heart rhythm:        Vascular    Pain in calf, thigh, or hip brought on by ambulation:    Pain in feet at night that wakes you up from your sleep:     Blood clot in your veins:    Leg swelling:         Pulmonary    Oxygen at home:    Productive cough:     Wheezing:         Neurologic    Sudden weakness in arms or legs:     Sudden numbness in arms or legs:     Sudden onset of difficulty speaking or slurred speech:    Temporary loss of vision in one eye:     Problems with dizziness:         Gastrointestinal    Blood in stool:     Vomited blood:         Genitourinary    Burning when urinating:     Blood in urine:        Psychiatric    Major depression:         Hematologic    Bleeding problems:    Problems with blood clotting too easily:        Skin    Rashes or ulcers:        Constitutional    Fever or chills:      PHYSICAL EXAM: Filed Vitals:   05/03/15 1135 05/03/15 1139  BP: 144/73 181/84  Pulse: 61   Temp: 97.1 F (36.2 C)   TempSrc: Oral   Height: 5\' 4"  (1.626 m)   Weight: 139 lb 14.4 oz (63.458 kg)   SpO2: 100%     GENERAL: The patient is a well-nourished  female, in no acute distress. Poor dentition. The vital signs are documented above. CARDIAC: There is a regular rate and rhythm.  VASCULAR:  2+ palpable left radial pulse. Left hand and fingers are pink/warm/brisk capillary refill. No swelling of left upper extremity, no erythema. Left antecubital incision is healing well with no signs of infection, no drainage, no separation of incision edges.  PULMONARY: There is good air exchange bilaterally without wheezing or rales. ABDOMEN: Soft and non-tender with normal pitched bowel sounds.  MUSCULOSKELETAL: There are no major deformities or cyanosis. NEUROLOGIC: No focal weakness or paresthesias are detected. SKIN: There are no ulcers or rashes noted. PSYCHIATRIC: The patient has a normal affect.   DATA: 05/03/2015 UPPER EXTREMITY ARTERIAL DUPLEX EVALUATION    INDICATION: Left upper extremity pain, numbness/tingling of fingers    PREVIOUS INTERVENTION(S): Left Brachial,ulnar and radial artery thrombectomy 04/30/2015. Left subclavian stent 06/24/12 with subsequent in-stent stenosis and angioplasty  08/12/13.    DUPLEX EXAM:     RIGHT LOCATION LEFT   Peak Systolic Velocity (cm/s) Ratio (if abnormal) Waveform  Peak Systolic Velocity (cm/s) Ratio (if abnormal) Waveform     Subclavian Artery 172  B     Axillary Artery 136  T     Brachial Artery Proximal 104  T     Brachial Artery Mid 67  T     Brachial Artery Distal  53  T     Radial Artery Proximal 43  B     Radial Artery Distal 49  B     Ulnar Artery Proximal 49  B     Ulnar Artery Distal 54  b   Waveform:    M - Monophasic       B - Biphasic       T - Triphasic    ADDITIONAL FINDINGS: Medial left subclavian artery 280 cm/s, no plaque visualized    IMPRESSION: 1. Patent left upper extremity arterial system with no stenosis visualized. The stent in the left subclavian not visualized however the subclavian artery appears patent.      MEDICAL ISSUES:  #1: s/p  brachial, ulnar, and radial  artery thrombectomy for aute ischemia of left hand on 04/30/15. Swelling and erythema of her left arm that she described from yesterday has resolved by today. Left antecubital incision is healing well with no signs of infection, no drainage. Left radial pulse is 2+ palpable. Left hand/fingers are warm and pink. Today's left upper extremity arterial duplex indicates left upper extremity arterial system with no stenosis visualized. The stent in the left subclavian not visualized however the subclavian artery appears patent. Pt reports taking partial doses yesterday of some left over Augmentin that she had, but this upset her stomach.  Keflex 500 mg tid x 7 days.  Follow up with Dr. Trula Slade as scheduled on 05/15/15 for post op evaluation. I advised pt to notify us if she has any further concerns re her left arm.  #2: Subclavian stenosis, left, s/p stent placement #3: Status post aortobifemoral bypass  #4Carotid stenosis #5 Active smoking, her primary atherosclerotic risk factor - The patient was counseled re smoking cessation and given several free resources re smoking cessation.       Cairo Agostinelli, Sharmon Leyden, RN, MSN, FNP-C [facility]

## 2015-05-03 NOTE — Patient Instructions (Signed)
Stroke Prevention Some medical conditions and behaviors are associated with an increased chance of having a stroke. You may prevent a stroke by making healthy choices and managing medical conditions. HOW CAN I REDUCE MY RISK OF HAVING A STROKE?   Stay physically active. Get at least 30 minutes of activity on most or all days.  Do not smoke. It may also be helpful to avoid exposure to secondhand smoke.  Limit alcohol use. Moderate alcohol use is considered to be:  No more than 2 drinks per day for men.  No more than 1 drink per day for nonpregnant women.  Eat healthy foods. This involves:  Eating 5 or more servings of fruits and vegetables a day.  Making dietary changes that address high blood pressure (hypertension), high cholesterol, diabetes, or obesity.  Manage your cholesterol levels.  Making food choices that are high in fiber and low in saturated fat, trans fat, and cholesterol may control cholesterol levels.  Take any prescribed medicines to control cholesterol as directed by your health care provider.  Manage your diabetes.  Controlling your carbohydrate and sugar intake is recommended to manage diabetes.  Take any prescribed medicines to control diabetes as directed by your health care provider.  Control your hypertension.  Making food choices that are low in salt (sodium), saturated fat, trans fat, and cholesterol is recommended to manage hypertension.  Ask your health care provider if you need treatment to lower your blood pressure. Take any prescribed medicines to control hypertension as directed by your health care provider.  If you are 18-39 years of age, have your blood pressure checked every 3-5 years. If you are 40 years of age or older, have your blood pressure checked every year.  Maintain a healthy weight.  Reducing calorie intake and making food choices that are low in sodium, saturated fat, trans fat, and cholesterol are recommended to manage  weight.  Stop drug abuse.  Avoid taking birth control pills.  Talk to your health care provider about the risks of taking birth control pills if you are over 35 years old, smoke, get migraines, or have ever had a blood clot.  Get evaluated for sleep disorders (sleep apnea).  Talk to your health care provider about getting a sleep evaluation if you snore a lot or have excessive sleepiness.  Take medicines only as directed by your health care provider.  For some people, aspirin or blood thinners (anticoagulants) are helpful in reducing the risk of forming abnormal blood clots that can lead to stroke. If you have the irregular heart rhythm of atrial fibrillation, you should be on a blood thinner unless there is a good reason you cannot take them.  Understand all your medicine instructions.  Make sure that other conditions (such as anemia or atherosclerosis) are addressed. SEEK IMMEDIATE MEDICAL CARE IF:   You have sudden weakness or numbness of the face, arm, or leg, especially on one side of the body.  Your face or eyelid droops to one side.  You have sudden confusion.  You have trouble speaking (aphasia) or understanding.  You have sudden trouble seeing in one or both eyes.  You have sudden trouble walking.  You have dizziness.  You have a loss of balance or coordination.  You have a sudden, severe headache with no known cause.  You have new chest pain or an irregular heartbeat. Any of these symptoms may represent a serious problem that is an emergency. Do not wait to see if the symptoms will   go away. Get medical help at once. Call your local emergency services (911 in [place] Do not drive yourself to the hospital.   This information is not intended to replace advice given to you by your health care provider. Make sure you discuss any questions you have with your health care provider.   Document Released: 02/01/2004 Document Revised: 01/14/2014 Document Reviewed:  06/26/2012 Elsevier Interactive Patient Education 2016 Elsevier Inc.    Peripheral Vascular Disease Peripheral vascular disease (PVD) is a disease of the blood vessels that are not part of your heart and brain. A simple term for PVD is poor circulation. In most cases, PVD narrows the blood vessels that carry blood from your heart to the rest of your body. This can result in a decreased supply of blood to your arms, legs, and internal organs, like your stomach or kidneys. However, it most often affects a person's lower legs and feet. There are two types of PVD.  Organic PVD. This is the more common type. It is caused by damage to the structure of blood vessels.  Functional PVD. This is caused by conditions that make blood vessels contract and tighten (spasm). Without treatment, PVD tends to get worse over time. PVD can also lead to acute ischemic limb. This is when an arm or limb suddenly has trouble getting enough blood. This is a medical emergency. CAUSES Each type of PVD has many different causes. The most common cause of PVD is buildup of a fatty material (plaque) inside of your arteries (atherosclerosis). Small amounts of plaque can break off from the walls of the blood vessels and become lodged in a smaller artery. This blocks blood flow and can cause acute ischemic limb. Other common causes of PVD include:  Blood clots that form inside of blood vessels.  Injuries to blood vessels.  Diseases that cause inflammation of blood vessels or cause blood vessel spasms.  Health behaviors and health history that increase your risk of developing PVD. RISK FACTORS  You may have a greater risk of PVD if you:  Have a family history of PVD.  Have certain medical conditions, including:  High cholesterol.  Diabetes.  High blood pressure (hypertension).  Coronary heart disease.  Past problems with blood clots.  Past injury, such as burns or a broken bone. These may have damaged blood  vessels in your limbs.  Buerger disease. This is caused by inflamed blood vessels in your hands and feet.  Some forms of arthritis.  Rare birth defects that affect the arteries in your legs.  Use tobacco.  Do not get enough exercise.  Are obese.  Are age 50 or older. SIGNS AND SYMPTOMS  PVD may cause many different symptoms. Your symptoms depend on what part of your body is not getting enough blood. Some common signs and symptoms include:  Cramps in your lower legs. This may be a symptom of poor leg circulation (claudication).  Pain and weakness in your legs while you are physically active that goes away when you rest (intermittent claudication).  Leg pain when at rest.  Leg numbness, tingling, or weakness.  Coldness in a leg or foot, especially when compared with the other leg.  Skin or hair changes. These can include:  Hair loss.  Shiny skin.  Pale or bluish skin.  Thick toenails.  Inability to get or maintain an erection (erectile dysfunction). People with PVD are more prone to developing ulcers and sores on their toes, feet, or legs. These may take longer than   normal to heal. DIAGNOSIS Your health care provider may diagnose PVD from your signs and symptoms. The health care provider will also do a physical exam. You may have tests to find out what is causing your PVD and determine its severity. Tests may include:  Blood pressure recordings from your arms and legs and measurements of the strength of your pulses (pulse volume recordings).  Imaging studies using sound waves to take pictures of the blood flow through your blood vessels (Doppler ultrasound).  Injecting a dye into your blood vessels before having imaging studies using:  X-rays (angiogram or arteriogram).  Computer-generated X-rays (CT angiogram).  A powerful electromagnetic field and a computer (magnetic resonance angiogram or MRA). TREATMENT Treatment for PVD depends on the cause of your condition  and the severity of your symptoms. It also depends on your age. Underlying causes need to be treated and controlled. These include long-lasting (chronic) conditions, such as diabetes, high cholesterol, and high blood pressure. You may need to first try making lifestyle changes and taking medicines. Surgery may be needed if these do not work. Lifestyle changes may include:  Quitting smoking.  Exercising regularly.  Following a low-fat, low-cholesterol diet. Medicines may include:  Blood thinners to prevent blood clots.  Medicines to improve blood flow.  Medicines to improve your blood cholesterol levels. Surgical procedures may include:  A procedure that uses an inflated balloon to open a blocked artery and improve blood flow (angioplasty).  A procedure to put in a tube (stent) to keep a blocked artery open (stent implant).  Surgery to reroute blood flow around a blocked artery (peripheral bypass surgery).  Surgery to remove dead tissue from an infected wound on the affected limb.  Amputation. This is surgical removal of the affected limb. This may be necessary in cases of acute ischemic limb that are not improved through medical or surgical treatments. HOME CARE INSTRUCTIONS  Take medicines only as directed by your health care provider.  Do not use any tobacco products, including cigarettes, chewing tobacco, or electronic cigarettes. If you need help quitting, ask your health care provider.  Lose weight if you are overweight, and maintain a healthy weight as directed by your health care provider.  Eat a diet that is low in fat and cholesterol. If you need help, ask your health care provider.  Exercise regularly. Ask your health care provider to suggest some good activities for you.  Use compression stockings or other mechanical devices as directed by your health care provider.  Take good care of your feet.  Wear comfortable shoes that fit well.  Check your feet often for  any cuts or sores. SEEK MEDICAL CARE IF:  You have cramps in your legs while walking.  You have leg pain when you are at rest.  You have coldness in a leg or foot.  Your skin changes.  You have erectile dysfunction.  You have cuts or sores on your feet that are not healing. SEEK IMMEDIATE MEDICAL CARE IF:  Your arm or leg turns cold and blue.  Your arms or legs become red, warm, swollen, painful, or numb.  You have chest pain or trouble breathing.  You suddenly have weakness in your face, arm, or leg.  You become very confused or lose the ability to speak.  You suddenly have a very bad headache or lose your vision.   This information is not intended to replace advice given to you by your health care provider. Make sure you discuss any questions   you have with your health care provider.   Document Released: 02/01/2004 Document Revised: 01/14/2014 Document Reviewed: 06/03/2013 Elsevier Interactive Patient Education 2016 Elsevier Inc.     Steps to Quit Smoking  Smoking tobacco can be harmful to your health and can affect almost every organ in your body. Smoking puts you, and those around you, at risk for developing many serious chronic diseases. Quitting smoking is difficult, but it is one of the best things that you can do for your health. It is never too late to quit. WHAT ARE THE BENEFITS OF QUITTING SMOKING? When you quit smoking, you lower your risk of developing serious diseases and conditions, such as:  Lung cancer or lung disease, such as COPD.  Heart disease.  Stroke.  Heart attack.  Infertility.  Osteoporosis and bone fractures. Additionally, symptoms such as coughing, wheezing, and shortness of breath may get better when you quit. You may also find that you get sick less often because your body is stronger at fighting off colds and infections. If you are pregnant, quitting smoking can help to reduce your chances of having a baby of low birth weight. HOW DO  I GET READY TO QUIT? When you decide to quit smoking, create a plan to make sure that you are successful. Before you quit:  Pick a date to quit. Set a date within the next two weeks to give you time to prepare.  Write down the reasons why you are quitting. Keep this list in places where you will see it often, such as on your bathroom mirror or in your car or wallet.  Identify the people, places, things, and activities that make you want to smoke (triggers) and avoid them. Make sure to take these actions:  Throw away all cigarettes at home, at work, and in your car.  Throw away smoking accessories, such as ashtrays and lighters.  Clean your car and make sure to empty the ashtray.  Clean your home, including curtains and carpets.  Tell your family, friends, and coworkers that you are quitting. Support from your loved ones can make quitting easier.  Talk with your health care provider about your options for quitting smoking.  Find out what treatment options are covered by your health insurance. WHAT STRATEGIES CAN I USE TO QUIT SMOKING?  Talk with your healthcare provider about different strategies to quit smoking. Some strategies include:  Quitting smoking altogether instead of gradually lessening how much you smoke over a period of time. Research shows that quitting "cold turkey" is more successful than gradually quitting.  Attending in-person counseling to help you build problem-solving skills. You are more likely to have success in quitting if you attend several counseling sessions. Even short sessions of 10 minutes can be effective.  Finding resources and support systems that can help you to quit smoking and remain smoke-free after you quit. These resources are most helpful when you use them often. They can include:  Online chats with a counselor.  Telephone quitlines.  Printed self-help materials.  Support groups or group counseling.  Text messaging programs.  Mobile phone  applications.  Taking medicines to help you quit smoking. (If you are pregnant or breastfeeding, talk with your health care provider first.) Some medicines contain nicotine and some do not. Both types of medicines help with cravings, but the medicines that include nicotine help to relieve withdrawal symptoms. Your health care provider may recommend:  Nicotine patches, gum, or lozenges.  Nicotine inhalers or sprays.  Non-nicotine medicine that   is taken by mouth. Talk with your health care provider about combining strategies, such as taking medicines while you are also receiving in-person counseling. Using these two strategies together makes you more likely to succeed in quitting than if you used either strategy on its own. If you are pregnant or breastfeeding, talk with your health care provider about finding counseling or other support strategies to quit smoking. Do not take medicine to help you quit smoking unless told to do so by your health care provider. WHAT THINGS CAN I DO TO MAKE IT EASIER TO QUIT? Quitting smoking might feel overwhelming at first, but there is a lot that you can do to make it easier. Take these important actions:  Reach out to your family and friends and ask that they support and encourage you during this time. Call telephone quitlines, reach out to support groups, or work with a counselor for support.  Ask people who smoke to avoid smoking around you.  Avoid places that trigger you to smoke, such as bars, parties, or smoke-break areas at work.  Spend time around people who do not smoke.  Lessen stress in your life, because stress can be a smoking trigger for some people. To lessen stress, try:  Exercising regularly.  Deep-breathing exercises.  Yoga.  Meditating.  Performing a body scan. This involves closing your eyes, scanning your body from head to toe, and noticing which parts of your body are particularly tense. Purposefully relax the muscles in those  areas.  Download or purchase mobile phone or tablet apps (applications) that can help you stick to your quit plan by providing reminders, tips, and encouragement. There are many free apps, such as QuitGuide from the CDC (Centers for Disease Control and Prevention). You can find other support for quitting smoking (smoking cessation) through smokefree.gov and other websites. HOW WILL I FEEL WHEN I QUIT SMOKING? Within the first 24 hours of quitting smoking, you may start to feel some withdrawal symptoms. These symptoms are usually most noticeable 2-3 days after quitting, but they usually do not last beyond 2-3 weeks. Changes or symptoms that you might experience include:  Mood swings.  Restlessness, anxiety, or irritation.  Difficulty concentrating.  Dizziness.  Strong cravings for sugary foods in addition to nicotine.  Mild weight gain.  Constipation.  Nausea.  Coughing or a sore throat.  Changes in how your medicines work in your body.  A depressed mood.  Difficulty sleeping (insomnia). After the first 2-3 weeks of quitting, you may start to notice more positive results, such as:  Improved sense of smell and taste.  Decreased coughing and sore throat.  Slower heart rate.  Lower blood pressure.  Clearer skin.  The ability to breathe more easily.  Fewer sick days. Quitting smoking is very challenging for most people. Do not get discouraged if you are not successful the first time. Some people need to make many attempts to quit before they achieve long-term success. Do your best to stick to your quit plan, and talk with your health care provider if you have any questions or concerns.   This information is not intended to replace advice given to you by your health care provider. Make sure you discuss any questions you have with your health care provider.   Document Released: 12/18/2000 Document Revised: 05/10/2014 Document Reviewed: 05/10/2014 Elsevier Interactive Patient  Education 2016 Elsevier Inc.  

## 2015-05-05 ENCOUNTER — Encounter: Payer: Self-pay | Admitting: Surgery

## 2015-05-05 NOTE — Discharge Summary (Signed)
[facility] Discharge Summary  Kimberly [name] [birth year] 65 y.o. female  [id number]  Admission Date: 04/30/2015  Discharge Date: 05/01/2015  Physician: Annamarie Major, MD  Admission Diagnosis: Numbness of left hand [R20.8]  HPI:   This is a 65 y.o. female s/p left subclavian stent by Dr Trula Slade several days ago. This was done via left brachial approach. She complains of intermittent numbness in the left forearm into the left hand that is worse in the morning when she first wakes up. It does resolve but can recur 2-3 times per day. She was asymptomatic in the left arm prior to the procedure. Other medical problems include asthma, hyperlipidemia, anxiety, hypertension all of which have been stable. She is on plavix and aspirin. She continues to smoke. She states that currently the numbness has resolved.  Her arterial duplex showed brachial artery occlusion.   Hospital Course:  The patient was admitted to the hospital and taken to the operating room on 04/30/2015 and underwent: left brachial, ulnar and radial artery thrombectomy    The patient tolerated the procedure well and was transported to the PACU in stable condition.   POD 1: The patient had palpable left radial and ulnar pulses. She was complaining of arm soreness. Her incision was clean and intact without hematoma. She was discharged home on POD 1 in good condition.     CBC    Component Value Date/Time   WBC 8.9 05/01/2015 0224   RBC 3.77* 05/01/2015 0224   HGB 12.4 05/01/2015 0224   HCT 37.0 05/01/2015 0224   PLT 171 05/01/2015 0224   MCV 98.1 05/01/2015 0224   MCH 32.9 05/01/2015 0224   MCHC 33.5 05/01/2015 0224   RDW 13.3 05/01/2015 0224   LYMPHSABS 2.8 03/21/2014 1327   MONOABS 0.6 03/21/2014 1327   EOSABS 0.1 03/21/2014 1327   BASOSABS 0.0 03/21/2014 1327    BMET    Component Value Date/Time   NA 138 05/01/2015 0224   K 3.3* 05/01/2015 0224   CL 104 05/01/2015 0224   CO2 23  05/01/2015 0224   GLUCOSE 98 05/01/2015 0224   BUN 11 05/01/2015 0224   CREATININE 0.77 05/01/2015 0224   CREATININE 0.86 03/21/2014 1327   CALCIUM 8.0* 05/01/2015 0224   GFRNONAA >60 05/01/2015 0224   GFRNONAA 73 03/21/2014 1327   GFRAA >60 05/01/2015 0224   GFRAA 84 03/21/2014 1327     Discharge Instructions:   The patient is discharged to home with extensive instructions on wound care and progressive ambulation.  They are instructed not to drive or perform any heavy lifting until returning to see the physician in his office.  Discharge Instructions    Call MD for:  redness, tenderness, or signs of infection (pain, swelling, bleeding, redness, odor or green/yellow discharge around incision site)    Complete by:  As directed      Call MD for:  severe or increased pain, loss or decreased feeling  in affected limb(s)    Complete by:  As directed      Call MD for:  temperature >100.5    Complete by:  As directed      Discharge wound care:    Complete by:  As directed   Wash left arm wound with soap and water daily and pat dry. Do not apply any creams or ointments to incision.     Driving Restrictions    Complete by:  As directed   No driving for 2 weeks or longer  if unable to use left arm effectively.     Increase activity slowly    Complete by:  As directed      Lifting restrictions    Complete by:  As directed   No lifting for 1 week     Resume previous diet    Complete by:  As directed            Discharge Diagnosis:  Numbness of left hand [R20.8]  Secondary Diagnosis: Patient Active Problem List   Diagnosis Date Noted  . 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 Neck pain 08/28/2013  . Wound infection after surgery 08/28/2013  . Drainage from wound-Right neck 08/19/2013  . Visit for wound check 08/19/2013  . Swelling of limb-Right neck 08/19/2013  . Carotid stenosis 08/12/2013  . 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 Numbness-Bilateral arms 01/11/2013    . Cholecystitis, acute 12/05/2012  . Weakness 06/29/2012  . Pain in limb-Left neck,shoulder and arm 06/29/2012  . Aftercare following surgery of the circulatory system, [place] 12/09/2011  . 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 Small bowel obstruction due to adhesions.  Enterolysis 04/10/2011. 05/02/2011  . Pain in the abdomen 04/08/2011  . Ileus ([place]) 04/08/2011  . PVD (peripheral vascular disease) ([place]) 04/08/2011  . 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 Occlusion and stenosis of carotid artery without mention of cerebral infarction 03/25/2011  . Atherosclerosis of native arteries of the extremities with intermittent claudication 03/14/2011  . PVD (peripheral vascular disease) with claudication ([place]) 02/06/2011  . Subclavian artery stenosis ([place]) 01/28/2011  . PAP SMEAR, LGSIL, ABNORMAL 03/30/2010  . HYPERCHOLESTEROLEMIA 03/28/2010  . INTERMITTENT CLAUDICATION 03/27/2010  . CYSTITIS, ACUTE 03/27/2010  . LYMPHADENOPATHY 02/06/2010  . TOBACCO ABUSE 12/07/2009  . DENTAL PAIN 12/07/2009  . MENOPAUSE, SURGICAL 01/07/2005   Past Medical History  Diagnosis Date  . Asthma   . Hyperlipidemia   . History of lead poisoning 1977  . Shortness of breath   . Peripheral vascular disease ([facility])     claudication  right greater than left  femoral artery  . Anxiety   . Blood transfusion   . Migraine   . Subclavian artery disease ([place])     left  . Small bowel obstruction, partial ([place]) 04/08/11  . Angina     March 2013  . Depression   . Uterine cancer ([facility])     Pre cervical  . Fibromyalgia   . Anemia   . Stroke ([place])   . Hypertension        Medication List    TAKE these medications        albuterol 108 (90 Base) MCG/ACT inhaler  Commonly known as:  PROVENTIL HFA;VENTOLIN HFA  Inhale 1-2 puffs into the lungs every 6 (six) hours as needed for wheezing or shortness of breath.     aspirin EC 81 MG tablet  Take 1 tablet (81 mg total) by mouth daily.     clopidogrel  75 MG tablet  Commonly known as:  PLAVIX  Take 1 tablet (75 mg total) by mouth at bedtime.     ibuprofen 200 MG tablet  Commonly known as:  ADVIL,MOTRIN  Take 200 mg by mouth every 6 (six) hours as needed for moderate pain (pain).     traMADol 50 MG tablet  Commonly known as:  ULTRAM  Take 1 tablet (50 mg total) by mouth every 6 (six) hours as needed.        Disposition: Home  Patient's condition: is Good  Follow up: 1. Dr. Trula Slade in 2 weeks   Virgina Jock, PA-C [facility] [phone] 05/05/2015  2:30 PM

## 2015-05-07 ENCOUNTER — Emergency Department (HOSPITAL_BASED_OUTPATIENT_CLINIC_OR_DEPARTMENT_OTHER)
Admission: EM | Admit: 2015-05-07 | Discharge: 2015-05-07 | Disposition: A | Discharge status: Home / Self Care | Payer: Medicare Other | Source: Home / Self Care | Attending: Emergency Medicine | Admitting: Emergency Medicine

## 2015-05-07 ENCOUNTER — Emergency Department (HOSPITAL_COMMUNITY)
Admission: EM | Admit: 2015-05-07 | Discharge: 2015-05-07 | Disposition: A | Discharge status: Home / Self Care | Payer: Medicare Other | Attending: Emergency Medicine | Admitting: Emergency Medicine

## 2015-05-07 ENCOUNTER — Emergency Department (HOSPITAL_COMMUNITY)
Admission: EM | Admit: 2015-05-07 | Discharge: 2015-05-07 | Disposition: A | Discharge status: Home / Self Care | Payer: Medicare Other | Source: Home / Self Care

## 2015-05-07 ENCOUNTER — Encounter (HOSPITAL_COMMUNITY): Payer: Self-pay | Admitting: *Deleted

## 2015-05-07 ENCOUNTER — Encounter (HOSPITAL_COMMUNITY): Payer: Self-pay | Admitting: Emergency Medicine

## 2015-05-07 ENCOUNTER — Emergency Department (EMERGENCY_DEPARTMENT_HOSPITAL)
Admission: RE | Admit: 2015-05-07 | Discharge: 2015-05-07 | Disposition: A | Discharge status: Home / Self Care | Payer: Medicare Other | Source: Ambulatory Visit | Attending: Emergency Medicine | Admitting: Emergency Medicine

## 2015-05-07 DIAGNOSIS — I209 Angina pectoris, unspecified: Secondary | ICD-10-CM | POA: Insufficient documentation

## 2015-05-07 DIAGNOSIS — M79603 Pain in arm, unspecified: Secondary | ICD-10-CM | POA: Diagnosis not present

## 2015-05-07 DIAGNOSIS — I10 Essential (primary) hypertension: Secondary | ICD-10-CM

## 2015-05-07 DIAGNOSIS — S4992XA Unspecified injury of left shoulder and upper arm, initial encounter: Secondary | ICD-10-CM | POA: Insufficient documentation

## 2015-05-07 DIAGNOSIS — Y9289 Other specified places as the place of occurrence of the external cause: Secondary | ICD-10-CM

## 2015-05-07 DIAGNOSIS — M79609 Pain in unspecified limb: Secondary | ICD-10-CM | POA: Diagnosis not present

## 2015-05-07 DIAGNOSIS — Y998 Other external cause status: Secondary | ICD-10-CM | POA: Insufficient documentation

## 2015-05-07 DIAGNOSIS — J45909 Unspecified asthma, uncomplicated: Secondary | ICD-10-CM

## 2015-05-07 DIAGNOSIS — Y9389 Activity, other specified: Secondary | ICD-10-CM

## 2015-05-07 DIAGNOSIS — M79602 Pain in left arm: Secondary | ICD-10-CM | POA: Diagnosis not present

## 2015-05-07 DIAGNOSIS — X58XXXA Exposure to other specified factors, initial encounter: Secondary | ICD-10-CM

## 2015-05-07 DIAGNOSIS — F17210 Nicotine dependence, cigarettes, uncomplicated: Secondary | ICD-10-CM | POA: Insufficient documentation

## 2015-05-07 DIAGNOSIS — M7989 Other specified soft tissue disorders: Secondary | ICD-10-CM

## 2015-05-07 NOTE — Progress Notes (Signed)
VASCULAR LAB PRELIMINARY  PRELIMINARY  PRELIMINARY  PRELIMINARY  Left upper extremity venous duplex completed.    Preliminary report:  There is no DVT or SVT noted in the visualized veins of the left upper extremity.   Umair Rosiles, RVT 05/07/2015, 11:38 AM

## 2015-05-07 NOTE — ED Notes (Signed)
No answer

## 2015-05-07 NOTE — ED Notes (Signed)
p-t. Stated, I've had left arm pain pain since my stent on April 19. My doctor said its flowing good.

## 2015-05-07 NOTE — ED Notes (Signed)
Pt was here since 0815 and was triaged and then was called with no answer. Pt was in an ultrasound, tried to undo the discharge.  PT here with left arm pain and just had a stent placed in heart she had clots removed.  Pt states arm is still hurting.

## 2015-05-07 NOTE — ED Notes (Signed)
Treatment room became available, pt outside smoking.

## 2015-05-07 NOTE — Progress Notes (Signed)
VASCULAR LAB PRELIMINARY  PRELIMINARY  PRELIMINARY  PRELIMINARY  Left upper extremity arterial duplex completed.    Preliminary report:  There is no evidence of thrombosis in the left upper extremity arterial system.  Waveforms are normal.  Dorothia Passmore, RVT 05/07/2015, 11:36 AM

## 2015-05-07 NOTE — ED Notes (Signed)
Treatment room available for pt, pt refused and said take me out of your system, i'm going to [place], i just got off the phone with my lawyer and i'm leaving

## 2015-05-07 NOTE — ED Notes (Addendum)
Pt up at nurse first desk, explained situation to pt and informed pt that she will be going back as soon as a room was available. Pt stated that she wasn't going to wait, and that she was going to call her lawyer, then pt walked outside. Pt very rude and irate.

## 2015-05-08 ENCOUNTER — Telehealth: Payer: Self-pay

## 2015-05-08 DIAGNOSIS — M79602 Pain in left arm: Secondary | ICD-10-CM

## 2015-05-08 DIAGNOSIS — M7989 Other specified soft tissue disorders: Secondary | ICD-10-CM

## 2015-05-08 DIAGNOSIS — R200 Anesthesia of skin: Secondary | ICD-10-CM

## 2015-05-08 DIAGNOSIS — Z959 Presence of cardiac and vascular implant and graft, unspecified: Secondary | ICD-10-CM

## 2015-05-08 NOTE — Telephone Encounter (Signed)
Phone call from pt. With c/o swelling of left arm from base of fingers to the elbow.  Stated she has been in a lot of pain.  Also, c/o intermittent tingling of the fingers left hand.  Stated she went to the [facility] ER yesterday, and was not seen because when she was sent to the Radiology Dept. For an ultrasound, the nurses thought she left the ER.  Pt. Verbalized extreme frustration with the experience at the ER.  Reported if she puts any pressure on palm left hand, the pain strikes.  Stated she would like to talk to Dr. Trula Slade.  Noted a left Upper Extremity venous duplex was done in the ER 4/30; Impression:  "No evidence of deep vein or superficial thrombosis involving the left upper extremity and right subclavian vein."  The pt. stated "something is not right with my arm."   Advised will report to Dr. Trula Slade, and call back with recommendation.  Agreed.

## 2015-05-11 ENCOUNTER — Other Ambulatory Visit (HOSPITAL_COMMUNITY): Payer: Medicare Other

## 2015-05-11 ENCOUNTER — Ambulatory Visit: Payer: Medicare Other | Admitting: Vascular Surgery

## 2015-05-11 NOTE — Telephone Encounter (Addendum)
RE: need recommendation of next step  Received: Yesterday    Kimberly Mitchell, MD  Denman George, RN           Please have her come to the office on Thursday to be evaluated.     Discussed with Dr. Oneida Alar.  Recommended to schedule for a Left UE Art. Duplex with OV today.  Phone call to pt. To offer appt. Today; left voice message to call office to confirm she can come in for a 3:00 PM appt.

## 2015-05-15 ENCOUNTER — Ambulatory Visit (INDEPENDENT_AMBULATORY_CARE_PROVIDER_SITE_OTHER): Payer: Medicare Other | Admitting: Surgery

## 2015-05-15 ENCOUNTER — Encounter: Payer: Self-pay | Admitting: Surgery

## 2015-05-15 VITALS — BP 135/79 | HR 67 | Ht 64.0 in | Wt 140.2 lb

## 2015-05-15 DIAGNOSIS — I771 Stricture of artery: Secondary | ICD-10-CM

## 2015-05-15 DIAGNOSIS — I708 Atherosclerosis of other arteries: Secondary | ICD-10-CM

## 2015-05-15 NOTE — Progress Notes (Signed)
Patient name: Kimberly [name] MRN: [id number] DOB: [birth year] Sex: female  REASON FOR VISIT: follow up  HPI:  The patient is back today for follow-up. 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 She went back for lysis of adhesions secondary to abdominal pain in April. In June 2013 she underwent left carotid endarterectomy for a high-grade asymptomatic stenosis. She then later presented with dizziness and temporary vision loss. On 06/24/2012 she had stenting of her left subclavian artery. 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 On 08/13/2013 she underwent right carotid endarterectomy for an asymptomatic right carotid stenosis  She recently began having recurrent episodes of dizziness.  She went for angiography because of a left subclavian in-stent stenosis on 04/27/2015.  She was found to have a high-grade stenosis and this was stented.  On 04/30/2015 she presented with acute ischemia to the left hand.  She was taken to the operating room by Dr. fields who performed a thrombectomy.  Her symptoms have somewhat improved and she was discharged to home.  She began having pain and swelling in her arm and went to the emergency department.  She had a challenging visit to the emergency department.  Ultrasound showed no acute findings.  She is back today for further follow-up.  She still has a cool feeling in her hand and some numbness around the palm of the hand and fingers.  Current Outpatient Prescriptions  Medication Sig Dispense Refill  . albuterol (PROVENTIL HFA;VENTOLIN HFA) 108 (90 Base) MCG/ACT inhaler Inhale 1-2 puffs into the lungs every 6 (six) hours as needed for wheezing or shortness of breath.    Marland Kitchen aspirin EC 81 MG tablet Take 1 tablet (81 mg total) by mouth daily. 30 tablet 0  . clopidogrel (PLAVIX) 75 MG tablet Take 1 tablet (75 mg total) by mouth at bedtime. (Patient  taking differently: Take 75 mg by mouth daily. ) 90 tablet 3  . ibuprofen (ADVIL,MOTRIN) 200 MG tablet Take 200 mg by mouth every 6 (six) hours as needed for moderate pain (pain).     . traMADol (ULTRAM) 50 MG tablet Take 1 tablet (50 mg total) by mouth every 6 (six) hours as needed. 15 tablet 0  . cephALEXin (KEFLEX) 500 MG capsule Take 1 capsule (500 mg total) by mouth 3 (three) times daily. (Patient not taking: Reported on 05/15/2015) 21 capsule 0   No current facility-administered medications for this visit.    REVIEW OF SYSTEMS:  [X]  denotes positive finding, [ ]  denotes negative finding Cardiac  Comments:  Chest pain or chest pressure:    Shortness of breath upon exertion:    Short of breath when lying flat:    Irregular heart rhythm:    Constitutional    Fever or chills:      PHYSICAL EXAM: Filed Vitals:   05/15/15 0823  BP: 135/79  Pulse: 67  Height: 5\' 4"  (1.626 m)  Weight: 140 lb 3.2 oz (63.594 kg)  SpO2: 98%    GENERAL: The patient is a well-nourished female, in no acute distress. The vital signs are documented above. CARDIOVASCULAR: There is a regular rate and rhythm. PULMONARY: There is good air exchange bilaterally without wheezing or rales. Palpable radial and ulnar pulse. Incision has healed nicely  MEDICAL ISSUES: The patient is very frustrated about her recent events following her stenting.  Her symptoms of dizziness have resolved after stenting.  I have encouraged her to continue  using her left arm.  Hopefully her symptoms will resolve over the next several months.  She will follow-up with me in 6 months with ABIs and a carotid duplex.I gave her a prescription for 30 tramadol  Graciella Arment, [place] [facility] [place]: [phone]

## 2015-06-07 ENCOUNTER — Encounter: Payer: Self-pay | Admitting: Cardiology

## 2015-10-30 ENCOUNTER — Encounter (HOSPITAL_COMMUNITY): Payer: Medicare Other

## 2015-10-30 ENCOUNTER — Ambulatory Visit: Payer: Medicare Other | Admitting: Family

## 2015-11-02 ENCOUNTER — Encounter: Payer: Self-pay | Admitting: Family

## 2015-11-06 ENCOUNTER — Other Ambulatory Visit: Payer: Self-pay | Admitting: *Deleted

## 2015-11-06 DIAGNOSIS — I742 Embolism and thrombosis of arteries of the upper extremities: Secondary | ICD-10-CM

## 2015-11-06 DIAGNOSIS — Z48812 Encounter for surgical aftercare following surgery on the circulatory system: Secondary | ICD-10-CM

## 2015-11-07 ENCOUNTER — Ambulatory Visit (HOSPITAL_COMMUNITY)
Admission: RE | Admit: 2015-11-07 | Discharge: 2015-11-07 | Disposition: A | Discharge status: Home / Self Care | Payer: Medicare Other | Source: Ambulatory Visit | Attending: Family | Admitting: Family

## 2015-11-07 ENCOUNTER — Ambulatory Visit (INDEPENDENT_AMBULATORY_CARE_PROVIDER_SITE_OTHER)
Admission: RE | Admit: 2015-11-07 | Discharge: 2015-11-07 | Disposition: A | Discharge status: Home / Self Care | Payer: Medicare Other | Source: Ambulatory Visit | Attending: Family | Admitting: Family

## 2015-11-07 DIAGNOSIS — I771 Stricture of artery: Secondary | ICD-10-CM

## 2015-11-07 DIAGNOSIS — I742 Embolism and thrombosis of arteries of the upper extremities: Secondary | ICD-10-CM | POA: Diagnosis not present

## 2015-11-07 DIAGNOSIS — I6523 Occlusion and stenosis of bilateral carotid arteries: Secondary | ICD-10-CM

## 2015-11-07 DIAGNOSIS — Z48812 Encounter for surgical aftercare following surgery on the circulatory system: Secondary | ICD-10-CM

## 2015-11-08 ENCOUNTER — Encounter: Payer: Self-pay | Admitting: Family

## 2015-11-08 ENCOUNTER — Ambulatory Visit (INDEPENDENT_AMBULATORY_CARE_PROVIDER_SITE_OTHER): Payer: Medicare Other | Admitting: Family

## 2015-11-08 VITALS — BP 142/68 | HR 65 | Temp 97.0°F | Ht 64.0 in | Wt 140.6 lb

## 2015-11-08 DIAGNOSIS — R200 Anesthesia of skin: Secondary | ICD-10-CM | POA: Diagnosis not present

## 2015-11-08 DIAGNOSIS — I771 Stricture of artery: Secondary | ICD-10-CM | POA: Diagnosis not present

## 2015-11-08 DIAGNOSIS — Z9889 Other specified postprocedural states: Secondary | ICD-10-CM | POA: Diagnosis not present

## 2015-11-08 DIAGNOSIS — F17200 Nicotine dependence, unspecified, uncomplicated: Secondary | ICD-10-CM

## 2015-11-08 DIAGNOSIS — I6523 Occlusion and stenosis of bilateral carotid arteries: Secondary | ICD-10-CM

## 2015-11-08 DIAGNOSIS — R202 Paresthesia of skin: Secondary | ICD-10-CM

## 2015-11-08 DIAGNOSIS — I779 Disorder of arteries and arterioles, unspecified: Secondary | ICD-10-CM
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
## 2015-11-08 NOTE — Patient Instructions (Signed)
Stroke Prevention Some medical conditions and behaviors are associated with an increased chance of having a stroke. You may prevent a stroke by making healthy choices and managing medical conditions. HOW CAN I REDUCE MY RISK OF HAVING A STROKE?   Stay physically active. Get at least 30 minutes of activity on most or all days.  Do not smoke. It may also be helpful to avoid exposure to secondhand smoke.  Limit alcohol use. Moderate alcohol use is considered to be:  No more than 2 drinks per day for men.  No more than 1 drink per day for nonpregnant women.  Eat healthy foods. This involves:  Eating 5 or more servings of fruits and vegetables a day.  Making dietary changes that address high blood pressure (hypertension), high cholesterol, diabetes, or obesity.  Manage your cholesterol levels.  Making food choices that are high in fiber and low in saturated fat, trans fat, and cholesterol may control cholesterol levels.  Take any prescribed medicines to control cholesterol as directed by your health care provider.  Manage your diabetes.  Controlling your carbohydrate and sugar intake is recommended to manage diabetes.  Take any prescribed medicines to control diabetes as directed by your health care provider.  Control your hypertension.  Making food choices that are low in salt (sodium), saturated fat, trans fat, and cholesterol is recommended to manage hypertension.  Ask your health care provider if you need treatment to lower your blood pressure. Take any prescribed medicines to control hypertension as directed by your health care provider.  If you are 18-39 years of age, have your blood pressure checked every 3-5 years. If you are 40 years of age or older, have your blood pressure checked every year.  Maintain a healthy weight.  Reducing calorie intake and making food choices that are low in sodium, saturated fat, trans fat, and cholesterol are recommended to manage  weight.  Stop drug abuse.  Avoid taking birth control pills.  Talk to your health care provider about the risks of taking birth control pills if you are over 35 years old, smoke, get migraines, or have ever had a blood clot.  Get evaluated for sleep disorders (sleep apnea).  Talk to your health care provider about getting a sleep evaluation if you snore a lot or have excessive sleepiness.  Take medicines only as directed by your health care provider.  For some people, aspirin or blood thinners (anticoagulants) are helpful in reducing the risk of forming abnormal blood clots that can lead to stroke. If you have the irregular heart rhythm of atrial fibrillation, you should be on a blood thinner unless there is a good reason you cannot take them.  Understand all your medicine instructions.  Make sure that other conditions (such as anemia or atherosclerosis) are addressed. SEEK IMMEDIATE MEDICAL CARE IF:   You have sudden weakness or numbness of the face, arm, or leg, especially on one side of the body.  Your face or eyelid droops to one side.  You have sudden confusion.  You have trouble speaking (aphasia) or understanding.  You have sudden trouble seeing in one or both eyes.  You have sudden trouble walking.  You have dizziness.  You have a loss of balance or coordination.  You have a sudden, severe headache with no known cause.  You have new chest pain or an irregular heartbeat. Any of these symptoms may represent a serious problem that is an emergency. Do not wait to see if the symptoms will   go away. Get medical help at once. Call your local emergency services (911 in [place] Do not drive yourself to the hospital.   This information is not intended to replace advice given to you by your health care provider. Make sure you discuss any questions you have with your health care provider.   Document Released: 02/01/2004 Document Revised: 01/14/2014 Document Reviewed:  06/26/2012 Elsevier Interactive Patient Education 2016 Elsevier Inc.     Steps to Quit Smoking  Smoking tobacco can be harmful to your health and can affect almost every organ in your body. Smoking puts you, and those around you, at risk for developing many serious chronic diseases. Quitting smoking is difficult, but it is one of the best things that you can do for your health. It is never too late to quit. WHAT ARE THE BENEFITS OF QUITTING SMOKING? When you quit smoking, you lower your risk of developing serious diseases and conditions, such as:  Lung cancer or lung disease, such as COPD.  Heart disease.  Stroke.  Heart attack.  Infertility.  Osteoporosis and bone fractures. Additionally, symptoms such as coughing, wheezing, and shortness of breath may get better when you quit. You may also find that you get sick less often because your body is stronger at fighting off colds and infections. If you are pregnant, quitting smoking can help to reduce your chances of having a baby of low birth weight. HOW DO I GET READY TO QUIT? When you decide to quit smoking, create a plan to make sure that you are successful. Before you quit:  Pick a date to quit. Set a date within the next two weeks to give you time to prepare.  Write down the reasons why you are quitting. Keep this list in places where you will see it often, such as on your bathroom mirror or in your car or wallet.  Identify the people, places, things, and activities that make you want to smoke (triggers) and avoid them. Make sure to take these actions:  Throw away all cigarettes at home, at work, and in your car.  Throw away smoking accessories, such as ashtrays and lighters.  Clean your car and make sure to empty the ashtray.  Clean your home, including curtains and carpets.  Tell your family, friends, and coworkers that you are quitting. Support from your loved ones can make quitting easier.  Talk with your health care  provider about your options for quitting smoking.  Find out what treatment options are covered by your health insurance. WHAT STRATEGIES CAN I USE TO QUIT SMOKING?  Talk with your healthcare provider about different strategies to quit smoking. Some strategies include:  Quitting smoking altogether instead of gradually lessening how much you smoke over a period of time. Research shows that quitting "cold turkey" is more successful than gradually quitting.  Attending in-person counseling to help you build problem-solving skills. You are more likely to have success in quitting if you attend several counseling sessions. Even short sessions of 10 minutes can be effective.  Finding resources and support systems that can help you to quit smoking and remain smoke-free after you quit. These resources are most helpful when you use them often. They can include:  Online chats with a counselor.  Telephone quitlines.  Printed self-help materials.  Support groups or group counseling.  Text messaging programs.  Mobile phone applications.  Taking medicines to help you quit smoking. (If you are pregnant or breastfeeding, talk with your health care provider first.) Some   medicines contain nicotine and some do not. Both types of medicines help with cravings, but the medicines that include nicotine help to relieve withdrawal symptoms. Your health care provider may recommend:  Nicotine patches, gum, or lozenges.  Nicotine inhalers or sprays.  Non-nicotine medicine that is taken by mouth. Talk with your health care provider about combining strategies, such as taking medicines while you are also receiving in-person counseling. Using these two strategies together makes you more likely to succeed in quitting than if you used either strategy on its own. If you are pregnant or breastfeeding, talk with your health care provider about finding counseling or other support strategies to quit smoking. Do not take  medicine to help you quit smoking unless told to do so by your health care provider. WHAT THINGS CAN I DO TO MAKE IT EASIER TO QUIT? Quitting smoking might feel overwhelming at first, but there is a lot that you can do to make it easier. Take these important actions:  Reach out to your family and friends and ask that they support and encourage you during this time. Call telephone quitlines, reach out to support groups, or work with a counselor for support.  Ask people who smoke to avoid smoking around you.  Avoid places that trigger you to smoke, such as bars, parties, or smoke-break areas at work.  Spend time around people who do not smoke.  Lessen stress in your life, because stress can be a smoking trigger for some people. To lessen stress, try:  Exercising regularly.  Deep-breathing exercises.  Yoga.  Meditating.  Performing a body scan. This involves closing your eyes, scanning your body from head to toe, and noticing which parts of your body are particularly tense. Purposefully relax the muscles in those areas.  Download or purchase mobile phone or tablet apps (applications) that can help you stick to your quit plan by providing reminders, tips, and encouragement. There are many free apps, such as QuitGuide from the CDC (Centers for Disease Control and Prevention). You can find other support for quitting smoking (smoking cessation) through smokefree.gov and other websites. HOW WILL I FEEL WHEN I QUIT SMOKING? Within the first 24 hours of quitting smoking, you may start to feel some withdrawal symptoms. These symptoms are usually most noticeable 2-3 days after quitting, but they usually do not last beyond 2-3 weeks. Changes or symptoms that you might experience include:  Mood swings.  Restlessness, anxiety, or irritation.  Difficulty concentrating.  Dizziness.  Strong cravings for sugary foods in addition to nicotine.  Mild weight  gain.  Constipation.  Nausea.  Coughing or a sore throat.  Changes in how your medicines work in your body.  A depressed mood.  Difficulty sleeping (insomnia). After the first 2-3 weeks of quitting, you may start to notice more positive results, such as:  Improved sense of smell and taste.  Decreased coughing and sore throat.  Slower heart rate.  Lower blood pressure.  Clearer skin.  The ability to breathe more easily.  Fewer sick days. Quitting smoking is very challenging for most people. Do not get discouraged if you are not successful the first time. Some people need to make many attempts to quit before they achieve long-term success. Do your best to stick to your quit plan, and talk with your health care provider if you have any questions or concerns.   This information is not intended to replace advice given to you by your health care provider. Make sure you discuss any questions   you have with your health care provider.   Document Released: 12/18/2000 Document Revised: 05/10/2014 Document Reviewed: 05/10/2014 Elsevier Interactive Patient Education 2016 Elsevier Inc.  

## 2015-11-17 ENCOUNTER — Encounter: Payer: Self-pay | Admitting: Family

## 2015-11-23 ENCOUNTER — Encounter: Payer: Self-pay | Admitting: Surgery

## 2015-11-23 ENCOUNTER — Ambulatory Visit (INDEPENDENT_AMBULATORY_CARE_PROVIDER_SITE_OTHER): Payer: Medicare Other | Admitting: Surgery

## 2015-11-23 ENCOUNTER — Other Ambulatory Visit: Payer: Self-pay

## 2015-11-23 VITALS — BP 108/64 | HR 74 | Temp 97.0°F | Resp 24 | Ht 64.0 in | Wt 141.0 lb

## 2015-11-23 DIAGNOSIS — I779 Disorder of arteries and arterioles, unspecified: Secondary | ICD-10-CM | POA: Diagnosis not present

## 2015-11-23 DIAGNOSIS — I771 Stricture of artery: Secondary | ICD-10-CM

## 2015-11-23 NOTE — Progress Notes (Signed)
[facility] [place]  Patient name: Kimberly [name] MRN: [id number] DOB: 08/24/1950 Sex: female  REASON FOR VISIT: follow up  HPI: The patient is back today for follow-up. 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 She went back for lysis of adhesions secondary to abdominal pain in April. In June 2013 she underwent left carotid endarterectomy for a high-grade asymptomatic stenosis. She then later presented with dizziness and temporary vision loss. On 06/24/2012 she had stenting of her left subclavian artery. 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 On 08/13/2013 she underwent right carotid endarterectomy for an asymptomatic right carotid stenosis  She recently began having recurrent episodes of dizziness.  She went for angiography because of a left subclavian in-stent stenosis on 04/27/2015.  She was found to have a high-grade stenosis and this was stented.  On 04/30/2015 she presented with acute ischemia to the left hand.  She was taken to the operating room by Dr. fields who performed a thrombectomy.   Send her left arm from her operation.  About 3 months ago she began having a return of her headaches and dizziness.  Her ultrasound showed elevated velocities within her subclavian stent on the left as well as a brachial blood pressure discrepancy between arms.  Past Medical History:  Diagnosis Date  . Anemia   . Angina    March 2013  . Anxiety   . Asthma   . Blood transfusion   . Depression   . Fibromyalgia   . History of lead poisoning 1977  . Hyperlipidemia   . Hypertension   . Migraine   . Peripheral vascular disease (HCC)    claudication  right greater than left  femoral artery  . Shortness of breath   . Small bowel obstruction, partial 04/08/11  . Stroke ([place])   . 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 Uterine cancer (HCC)    Pre cervical    Family History  Problem Relation Age of Onset  . Cancer Mother     breast, colon, ovarian  . Anesthesia problems Neg Hx   . Cancer Brother 50    lung  . Hyperlipidemia Brother   . Cancer Other   . Stroke Other   . Cancer Maternal Grandmother     ovarian    SOCIAL HISTORY: Social History  Substance Use Topics  . Smoking status: Current Some Day Smoker    Packs/day: 0.10    Years: 45.00    Types: Cigarettes  . Smokeless tobacco: Never Used     Comment: pt states that she smokes about 6 cigs per day  . Alcohol use No    Allergies  Allergen Reactions  . Codeine Nausea And Vomiting    Dizziness, headache, has passed out  . Hydrocodone Nausea And Vomiting and Other (See Comments)    Headache and dizziness  . Other Shortness Of Breath    Allergy to cologne/perfume base  . Oxycodone Nausea And Vomiting and Other (See Comments)    Dizziness   . Percocet [Oxycodone-Acetaminophen] Nausea And Vomiting  . Vicodin [Hydrocodone-Acetaminophen] Nausea And Vomiting    Headache and dizziness    Current Outpatient Prescriptions  Medication Sig Dispense Refill  . albuterol (PROVENTIL HFA;VENTOLIN HFA) 108 (90 Base) MCG/ACT inhaler Inhale 1-2 puffs into the lungs every 6 (six) hours as needed for wheezing or shortness of breath.    Marland Kitchen aspirin  EC 81 MG tablet Take 1 tablet (81 mg total) by mouth daily. (Patient taking differently: Take 162 mg by mouth daily. ) 30 tablet 0  . clopidogrel (PLAVIX) 75 MG tablet Take 1 tablet (75 mg total) by mouth at bedtime. (Patient taking differently: Take 75 mg by mouth daily. ) 90 tablet 3  . ibuprofen (ADVIL,MOTRIN) 200 MG tablet Take 200 mg by mouth every 6 (six) hours as needed for moderate pain (pain).     . traMADol (ULTRAM) 50 MG tablet Take 1 tablet (50 mg total) by mouth every 6 (six) hours as needed. 15 tablet 0   No current facility-administered medications for this visit.     REVIEW OF SYSTEMS:    [X]  denotes positive finding, [ ]  denotes negative finding Cardiac  Comments:  Chest pain or chest pressure:    Shortness of breath upon exertion:    Short of breath when lying flat:    Irregular heart rhythm:        Vascular    Pain in calf, thigh, or hip brought on by ambulation:    Pain in feet at night that wakes you up from your sleep:     Blood clot in your veins:    Leg swelling:         Pulmonary    Oxygen at home:    Productive cough:     Wheezing:         Neurologic    Sudden weakness in arms or legs:     Sudden numbness in arms or legs:     Sudden onset of difficulty speaking or slurred speech:    Temporary loss of vision in one eye:     Problems with dizziness:  x       Gastrointestinal    Blood in stool:     Vomited blood:         Genitourinary    Burning when urinating:     Blood in urine:        Psychiatric    Major depression:         Hematologic    Bleeding problems:    Problems with blood clotting too easily:        Skin    Rashes or ulcers:        Constitutional    Fever or chills:      PHYSICAL EXAM: Vitals:   11/23/15 0844 11/23/15 0850  BP: (!) 152/76 108/64  Pulse: 74 74  Resp: (!) 24   Temp: 97 F (36.1 C)   TempSrc: Oral   SpO2: 96%   Weight: 141 lb (64 kg)   Height: 5\' 4"  (1.626 m)     GENERAL: The patient is a well-nourished female, in no acute distress. The vital signs are documented above. CARDIAC: There is a regular rate and rhythm. PULMONARY: There is good air exchange bilaterally without wheezing or rales. MUSCULOSKELETAL: There are no major deformities or cyanosis. NEUROLOGIC: No focal weakness or paresthesias are detected. SKIN: There are no ulcers or rashes noted. PSYCHIATRIC: The patient has a normal affect.  DATA:  Duplex reveals a greater than 30 point gradient between the brachial artery pressures.  There are also elevated velocities within the left subclavian artery with hyperplasia seen within her  stent  MEDICAL ISSUES: The patient will be scheduled for aortic arch angiography as well as left subclavian angiography with possible intervention.  I will plan on coming from the right groin as she has had  previous complications from a left brachial access.  This is been scheduled for Tuesday, November 28.    Annamarie Major, MD [facility] [phone] Pager [phone]

## 2015-11-27 ENCOUNTER — Encounter (HOSPITAL_COMMUNITY): Payer: Medicare Other

## 2015-11-27 ENCOUNTER — Ambulatory Visit: Payer: Medicare Other | Admitting: Family

## 2015-12-05 ENCOUNTER — Encounter (HOSPITAL_COMMUNITY)
Admission: RE | Disposition: A | Discharge status: Home / Self Care | Payer: Self-pay | Source: Ambulatory Visit | Attending: Surgery

## 2015-12-05 ENCOUNTER — Encounter (HOSPITAL_COMMUNITY): Payer: Self-pay | Admitting: *Deleted

## 2015-12-05 ENCOUNTER — Other Ambulatory Visit: Payer: Self-pay | Admitting: *Deleted

## 2015-12-05 ENCOUNTER — Ambulatory Visit (HOSPITAL_COMMUNITY)
Admission: RE | Admit: 2015-12-05 | Discharge: 2015-12-05 | Disposition: A | Discharge status: Home / Self Care | Payer: Medicare Other | Source: Ambulatory Visit | Attending: Surgery | Admitting: Surgery

## 2015-12-05 DIAGNOSIS — J45909 Unspecified asthma, uncomplicated: Secondary | ICD-10-CM | POA: Insufficient documentation

## 2015-12-05 DIAGNOSIS — F17210 Nicotine dependence, cigarettes, uncomplicated: Secondary | ICD-10-CM | POA: Insufficient documentation

## 2015-12-05 DIAGNOSIS — T82856A Stenosis of peripheral vascular stent, initial encounter: Secondary | ICD-10-CM | POA: Diagnosis not present

## 2015-12-05 DIAGNOSIS — T82858A Stenosis of vascular prosthetic devices, implants and grafts, initial encounter: Secondary | ICD-10-CM | POA: Insufficient documentation

## 2015-12-05 DIAGNOSIS — I708 Atherosclerosis of other arteries: Secondary | ICD-10-CM | POA: Insufficient documentation

## 2015-12-05 DIAGNOSIS — Z8542 Personal history of malignant neoplasm of other parts of uterus: Secondary | ICD-10-CM | POA: Diagnosis not present

## 2015-12-05 DIAGNOSIS — Z7982 Long term (current) use of aspirin: Secondary | ICD-10-CM | POA: Diagnosis not present

## 2015-12-05 DIAGNOSIS — Z8673 Personal history of transient ischemic attack (TIA), and cerebral infarction without residual deficits: Secondary | ICD-10-CM | POA: Diagnosis not present

## 2015-12-05 DIAGNOSIS — Y831 Surgical operation with implant of artificial internal device as the cause of abnormal reaction of the patient, or of later complication, without mention of misadventure at the time of the procedure: Secondary | ICD-10-CM | POA: Diagnosis not present

## 2015-12-05 DIAGNOSIS — Z7902 Long term (current) use of antithrombotics/antiplatelets: Secondary | ICD-10-CM | POA: Insufficient documentation

## 2015-12-05 DIAGNOSIS — R42 Dizziness and giddiness: Secondary | ICD-10-CM | POA: Diagnosis not present

## 2015-12-05 DIAGNOSIS — Z9862 Peripheral vascular angioplasty status: Secondary | ICD-10-CM

## 2015-12-05 DIAGNOSIS — I771 Stricture of artery: Secondary | ICD-10-CM

## 2015-12-05 HISTORY — PX: PERIPHERAL VASCULAR CATHETERIZATION: SHX172C

## 2015-12-05 LAB — POCT I-STAT, CHEM 8
BUN: 19 mg/dL (ref 6–20)
Calcium, Ion: 1.13 mmol/L — ABNORMAL LOW (ref 1.15–1.40)
Chloride: 102 mmol/L (ref 101–111)
Creatinine, Ser: 0.9 mg/dL (ref 0.44–1.00)
Glucose, Bld: 100 mg/dL — ABNORMAL HIGH (ref 65–99)
HCT: 43 % (ref 36.0–46.0)
Hemoglobin: 14.6 g/dL (ref 12.0–15.0)
Potassium: 3.9 mmol/L (ref 3.5–5.1)
Sodium: 139 mmol/L (ref 135–145)
TCO2: 26 mmol/L (ref 0–100)

## 2015-12-05 LAB — POCT ACTIVATED CLOTTING TIME
Activated Clotting Time: 274 seconds
Activated Clotting Time: 230 seconds
Activated Clotting Time: 186 seconds

## 2015-12-05 SURGERY — AORTIC ARCH ANGIOGRAPHY

## 2015-12-05 MED ORDER — FENTANYL CITRATE (PF) 100 MCG/2ML IJ SOLN
INTRAMUSCULAR | Status: AC
  Filled 2015-12-05: qty 2

## 2015-12-05 MED ORDER — HEPARIN SODIUM (PORCINE) 1000 UNIT/ML IJ SOLN
INTRAMUSCULAR | Status: DC | PRN
  Administered 2015-12-05: 6500 [IU] via INTRAVENOUS

## 2015-12-05 MED ORDER — HEPARIN SODIUM (PORCINE) 1000 UNIT/ML IJ SOLN
INTRAMUSCULAR | Status: AC
  Filled 2015-12-05: qty 1

## 2015-12-05 MED ORDER — HEPARIN (PORCINE) IN NACL 2-0.9 UNIT/ML-% IJ SOLN
INTRAMUSCULAR | Status: DC | PRN
  Administered 2015-12-05: 1000 mL via INTRA_ARTERIAL

## 2015-12-05 MED ORDER — FENTANYL CITRATE (PF) 100 MCG/2ML IJ SOLN
INTRAMUSCULAR | Status: DC | PRN
  Administered 2015-12-05: 50 ug via INTRAVENOUS

## 2015-12-05 MED ORDER — HEPARIN (PORCINE) IN NACL 2-0.9 UNIT/ML-% IJ SOLN
INTRAMUSCULAR | Status: AC
  Filled 2015-12-05: qty 1000

## 2015-12-05 MED ORDER — MIDAZOLAM HCL 2 MG/2ML IJ SOLN
INTRAMUSCULAR | Status: DC | PRN
  Administered 2015-12-05: 2 mg via INTRAVENOUS

## 2015-12-05 MED ORDER — MIDAZOLAM HCL 2 MG/2ML IJ SOLN
INTRAMUSCULAR | Status: AC
  Filled 2015-12-05: qty 2

## 2015-12-05 MED ORDER — LIDOCAINE HCL (PF) 1 % IJ SOLN
INTRAMUSCULAR | Status: AC
  Filled 2015-12-05: qty 30

## 2015-12-05 MED ORDER — SODIUM CHLORIDE 0.9 % IV SOLN: 1.0000 mL/kg/h | INTRAVENOUS | Status: DC

## 2015-12-05 MED ORDER — SODIUM CHLORIDE 0.9 % IV SOLN
INTRAVENOUS | Status: DC
  Administered 2015-12-05: 06:00:00 via INTRAVENOUS

## 2015-12-05 MED ORDER — LIDOCAINE HCL (PF) 1 % IJ SOLN
INTRAMUSCULAR | Status: DC | PRN
  Administered 2015-12-05: 12 mL

## 2015-12-05 MED ORDER — IODIXANOL 320 MG/ML IV SOLN
INTRAVENOUS | Status: DC | PRN
  Administered 2015-12-05: 105 mL via INTRA_ARTERIAL

## 2015-12-05 SURGICAL SUPPLY — 19 items
BALLN ARMADA 7X20X135 (BALLOONS) ×3
BALLN LUTONIX DCB 6X40X130 (BALLOONS) ×3
BALLOON ARMADA 7X20X135 (BALLOONS) IMPLANT
BALLOON LUTONIX DCB 6X40X130 (BALLOONS) IMPLANT
CATH ANGIO 5F BER2 100CM (CATHETERS) ×1 IMPLANT
CATH ANGIO 5F PIGTAIL 100CM (CATHETERS) ×1 IMPLANT
DEVICE CONTINUOUS FLUSH (MISCELLANEOUS) ×1 IMPLANT
DRAPE ZERO GRAVITY STERILE (DRAPES) ×1 IMPLANT
KIT ENCORE 26 ADVANTAGE (KITS) ×1 IMPLANT
KIT MICROINTRODUCER STIFF 5F (SHEATH) ×1 IMPLANT
KIT PV (KITS) ×3 IMPLANT
SHEATH GUIDING CAROTID 6FRX90 (SHEATH) ×1 IMPLANT
SHEATH PINNACLE 5F 10CM (SHEATH) ×1 IMPLANT
SHEATH PINNACLE 6F 10CM (SHEATH) ×1 IMPLANT
SYR MEDRAD MARK V 150ML (SYRINGE) ×3 IMPLANT
TRANSDUCER W/STOPCOCK (MISCELLANEOUS) ×3 IMPLANT
TRAY PV CATH (CUSTOM PROCEDURE TRAY) ×3 IMPLANT
WIRE BENTSON .035X145CM (WIRE) ×1 IMPLANT
WIRE HI TORQ VERSACORE J 260CM (WIRE) ×1 IMPLANT

## 2015-12-05 NOTE — Discharge Instructions (Signed)

## 2015-12-05 NOTE — Interval H&P Note (Signed)
History and Physical Interval Note:  12/05/2015 7:30 AM  Kimberly [name]  has presented today for surgery, with the diagnosis of dizziness - n stent stenosis  The various methods of treatment have been discussed with the patient and family. After consideration of risks, benefits and other options for treatment, the patient has consented to  Procedure(s): Aortic Arch Angiography (N/A) as a surgical intervention .  The patient's history has been reviewed, patient examined, no change in status, stable for surgery.  I have reviewed the patient's chart and labs.  Questions were answered to the patient's satisfaction.     Annamarie Major

## 2015-12-05 NOTE — H&P (View-Only) (Signed)
[facility] [place]  Patient name: Kimberly [name] MRN: [id number] DOB: 1950-10-05 Sex: female  REASON FOR VISIT: follow up  HPI: The patient is back today for follow-up. 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 She went back for lysis of adhesions secondary to abdominal pain in April. In June 2013 she underwent left carotid endarterectomy for a high-grade asymptomatic stenosis. She then later presented with dizziness and temporary vision loss. On 06/24/2012 she had stenting of her left subclavian artery. 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 On 08/13/2013 she underwent right carotid endarterectomy for an asymptomatic right carotid stenosis  She recently began having recurrent episodes of dizziness.  She went for angiography because of a left subclavian in-stent stenosis on 04/27/2015.  She was found to have a high-grade stenosis and this was stented.  On 04/30/2015 she presented with acute ischemia to the left hand.  She was taken to the operating room by Dr. fields who performed a thrombectomy.   Send her left arm from her operation.  About 3 months ago she began having a return of her headaches and dizziness.  Her ultrasound showed elevated velocities within her subclavian stent on the left as well as a brachial blood pressure discrepancy between arms.  Past Medical History:  Diagnosis Date  . Anemia   . Angina    March 2013  . Anxiety   . Asthma   . Blood transfusion   . Depression   . Fibromyalgia   . History of lead poisoning 1977  . Hyperlipidemia   . Hypertension   . Migraine   . Peripheral vascular disease (HCC)    claudication  right greater than left  femoral artery  . Shortness of breath   . Small bowel obstruction, partial 04/08/11  . Stroke ([place])   . 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 Uterine cancer (HCC)    Pre cervical    Family History  Problem Relation Age of Onset  . Cancer Mother     breast, colon, ovarian  . Anesthesia problems Neg Hx   . Cancer Brother 50    lung  . Hyperlipidemia Brother   . Cancer Other   . Stroke Other   . Cancer Maternal Grandmother     ovarian    SOCIAL HISTORY: Social History  Substance Use Topics  . Smoking status: Current Some Day Smoker    Packs/day: 0.10    Years: 45.00    Types: Cigarettes  . Smokeless tobacco: Never Used     Comment: pt states that she smokes about 6 cigs per day  . Alcohol use No    Allergies  Allergen Reactions  . Codeine Nausea And Vomiting    Dizziness, headache, has passed out  . Hydrocodone Nausea And Vomiting and Other (See Comments)    Headache and dizziness  . Other Shortness Of Breath    Allergy to cologne/perfume base  . Oxycodone Nausea And Vomiting and Other (See Comments)    Dizziness   . Percocet [Oxycodone-Acetaminophen] Nausea And Vomiting  . Vicodin [Hydrocodone-Acetaminophen] Nausea And Vomiting    Headache and dizziness    Current Outpatient Prescriptions  Medication Sig Dispense Refill  . albuterol (PROVENTIL HFA;VENTOLIN HFA) 108 (90 Base) MCG/ACT inhaler Inhale 1-2 puffs into the lungs every 6 (six) hours as needed for wheezing or shortness of breath.    Marland Kitchen aspirin  EC 81 MG tablet Take 1 tablet (81 mg total) by mouth daily. (Patient taking differently: Take 162 mg by mouth daily. ) 30 tablet 0  . clopidogrel (PLAVIX) 75 MG tablet Take 1 tablet (75 mg total) by mouth at bedtime. (Patient taking differently: Take 75 mg by mouth daily. ) 90 tablet 3  . ibuprofen (ADVIL,MOTRIN) 200 MG tablet Take 200 mg by mouth every 6 (six) hours as needed for moderate pain (pain).     . traMADol (ULTRAM) 50 MG tablet Take 1 tablet (50 mg total) by mouth every 6 (six) hours as needed. 15 tablet 0   No current facility-administered medications for this visit.     REVIEW OF SYSTEMS:    [X]  denotes positive finding, [ ]  denotes negative finding Cardiac  Comments:  Chest pain or chest pressure:    Shortness of breath upon exertion:    Short of breath when lying flat:    Irregular heart rhythm:        Vascular    Pain in calf, thigh, or hip brought on by ambulation:    Pain in feet at night that wakes you up from your sleep:     Blood clot in your veins:    Leg swelling:         Pulmonary    Oxygen at home:    Productive cough:     Wheezing:         Neurologic    Sudden weakness in arms or legs:     Sudden numbness in arms or legs:     Sudden onset of difficulty speaking or slurred speech:    Temporary loss of vision in one eye:     Problems with dizziness:  x       Gastrointestinal    Blood in stool:     Vomited blood:         Genitourinary    Burning when urinating:     Blood in urine:        Psychiatric    Major depression:         Hematologic    Bleeding problems:    Problems with blood clotting too easily:        Skin    Rashes or ulcers:        Constitutional    Fever or chills:      PHYSICAL EXAM: Vitals:   11/23/15 0844 11/23/15 0850  BP: (!) 152/76 108/64  Pulse: 74 74  Resp: (!) 24   Temp: 97 F (36.1 C)   TempSrc: Oral   SpO2: 96%   Weight: 141 lb (64 kg)   Height: 5\' 4"  (1.626 m)     GENERAL: The patient is a well-nourished female, in no acute distress. The vital signs are documented above. CARDIAC: There is a regular rate and rhythm. PULMONARY: There is good air exchange bilaterally without wheezing or rales. MUSCULOSKELETAL: There are no major deformities or cyanosis. NEUROLOGIC: No focal weakness or paresthesias are detected. SKIN: There are no ulcers or rashes noted. PSYCHIATRIC: The patient has a normal affect.  DATA:  Duplex reveals a greater than 30 point gradient between the brachial artery pressures.  There are also elevated velocities within the left subclavian artery with hyperplasia seen within her  stent  MEDICAL ISSUES: The patient will be scheduled for aortic arch angiography as well as left subclavian angiography with possible intervention.  I will plan on coming from the right groin as she has had  previous complications from a left brachial access.  This is been scheduled for Tuesday, November 28.    Annamarie Major, MD [facility] [phone] Pager [phone]

## 2015-12-05 NOTE — Op Note (Signed)
    Patient name: KAHMYA PINKHAM MRN: [id number] DOB: [birth year] Sex: female  12/05/2015 Pre-operative Diagnosis: dizziness Post-operative diagnosis:  Same Surgeon:  Annamarie Major Procedure Performed:  1.  Ultrasound-guided access, right femoral artery  2.  Aortic arch angiogram  3.  Left subclavian artery drug coated balloon angioplasty  4.  Conscious sedation 37 minutes    Indications:  The patient has had multiple previous interventions.  She is now having dizziness and ultrasound identified elevated velocities within her left subclavian stent.  She comes in today for further evaluation.  Procedure:  The patient was identified in the holding area and taken to room 8.  The patient was then placed supine on the table and prepped and draped in the usual sterile fashion.  A time out was called.  Conscious sedation was performed with the use of IV fentanyl and Versed under continuous physician and nurse monitoring.  Heart rate, blood pressure, and oxygen saturation continuously monitored.  Ultrasound was used to evaluate the right common femoral artery.  It was patent .  A digital ultrasound image was acquired.  A micropuncture needle was used to access the right common femoral artery under ultrasound guidance.  An 018 wire was advanced without resistance and a micropuncture sheath was placed.  The 018 wire was removed and a benson wire was placed.  The micropuncture sheath was exchanged for a 5 french sheath.  An omniflush catheter was advanced over the wire to the aortic arch and an aortic arch and Phillip Heal was performed Findings:   Aortic arch:  A type I aortic arch is identified.  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  Left common carotid is patent throughout it's course.  There is a stent within the left subclavian artery with in-stent stenosis measuring approximately 75%.    Intervention:  After the above images were obtained, the decision was made to  proceed with intervention.  A 6 French long sheath was inserted.  The patient was fully heparinized.  Using a Berenstein 2 catheter and a versa core wire, the left subclavian stent was cannulated.  I predilated this area with a 7 x 20 balloon taking it to 12 atm for 30 seconds.  Next, the Lutonix 6 x 40 balloon was used to perform drug coated balloon angioplasty at 10 atm for 2 minutes.  Completion angiogram revealed resolution of the stenosis.  Catheters and wires were removed.  The long sheath was exchanged out for a short 6 [place] sheath.  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  If the patient's symptoms of dizziness do not resolve, consideration for stenting of her innominate artery   V. Annamarie Major, M.D. [facility] Office: [phone] Pager:  [phone]

## 2015-12-05 NOTE — Progress Notes (Addendum)
Site area: rt groin Site Prior to Removal:  Level 0 Pressure Applied For: 30 minutes Manual:   yes Patient Status During Pull:  stable Post Pull Site:  Level 0 Post Pull Instructions Given:  yes Post Pull Pulses Present: yes Dressing Applied:   Gauze/tegaderm   Bedrest begins @ 1125 Comments:

## 2015-12-05 NOTE — Progress Notes (Signed)
Pt has a very strong cough, will take cough med at home, pt smokes.

## 2015-12-06 ENCOUNTER — Encounter (HOSPITAL_COMMUNITY): Payer: Self-pay | Admitting: Surgery

## 2016-03-05 ENCOUNTER — Encounter: Payer: Self-pay | Admitting: Family

## 2016-03-11 ENCOUNTER — Ambulatory Visit (HOSPITAL_COMMUNITY)
Admission: RE | Admit: 2016-03-11 | Discharge: 2016-03-11 | Disposition: A | Discharge status: Home / Self Care | Payer: Medicare Other | Source: Ambulatory Visit | Attending: Surgery | Admitting: Surgery

## 2016-03-11 ENCOUNTER — Ambulatory Visit (INDEPENDENT_AMBULATORY_CARE_PROVIDER_SITE_OTHER): Admit: 2016-03-11 | Discharge: 2016-03-11 | Disposition: A | Discharge status: Home / Self Care | Payer: Medicare Other

## 2016-03-11 ENCOUNTER — Encounter: Payer: Self-pay | Admitting: Family

## 2016-03-11 ENCOUNTER — Ambulatory Visit (INDEPENDENT_AMBULATORY_CARE_PROVIDER_SITE_OTHER): Payer: Medicare Other | Admitting: Family

## 2016-03-11 VITALS — BP 164/78 | HR 70 | Temp 98.0°F | Resp 16 | Ht 64.0 in | Wt 140.0 lb

## 2016-03-11 DIAGNOSIS — Z959 Presence of cardiac and vascular implant and graft, unspecified: Secondary | ICD-10-CM

## 2016-03-11 DIAGNOSIS — M7989 Other specified soft tissue disorders: Secondary | ICD-10-CM

## 2016-03-11 DIAGNOSIS — M79602 Pain in left arm: Secondary | ICD-10-CM | POA: Diagnosis not present

## 2016-03-11 DIAGNOSIS — R200 Anesthesia of skin: Secondary | ICD-10-CM

## 2016-03-11 DIAGNOSIS — I771 Stricture of artery: Secondary | ICD-10-CM | POA: Diagnosis not present

## 2016-03-11 DIAGNOSIS — Z9862 Peripheral vascular angioplasty status: Secondary | ICD-10-CM | POA: Insufficient documentation

## 2016-03-11 DIAGNOSIS — I6523 Occlusion and stenosis of bilateral carotid arteries: Secondary | ICD-10-CM

## 2016-03-11 DIAGNOSIS — F17200 Nicotine dependence, unspecified, uncomplicated: Secondary | ICD-10-CM

## 2016-03-11 DIAGNOSIS — Z9889 Other specified postprocedural states: Secondary | ICD-10-CM

## 2016-03-11 LAB — VAS US CAROTID
LEFT ECA DIAS: 36 cm/s
LEFT VERTEBRAL DIAS: 18 cm/s
Left CCA dist dias: 24 cm/s
Left CCA dist sys: 120 cm/s
Left CCA prox dias: 21 cm/s
Left CCA prox sys: 98 cm/s
Left ICA dist dias: -31 cm/s
Left ICA dist sys: -118 cm/s
Left ICA prox dias: -31 cm/s
Left ICA prox sys: -118 cm/s
RIGHT ECA DIAS: -12 cm/s
RIGHT VERTEBRAL DIAS: 36 cm/s
Right CCA prox dias: 16 cm/s
Right CCA prox sys: 90 cm/s
Right cca dist sys: -92 cm/s
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
## 2016-03-11 NOTE — Patient Instructions (Signed)
Stroke Prevention Some medical conditions and behaviors are associated with an increased chance of having a stroke. You may prevent a stroke by making healthy choices and managing medical conditions. How can I reduce my risk of having a stroke?  Stay physically active. Get at least 30 minutes of activity on most or all days.  Do not smoke. It may also be helpful to avoid exposure to secondhand smoke.  Limit alcohol use. Moderate alcohol use is considered to be:  No more than 2 drinks per day for men.  No more than 1 drink per day for nonpregnant women.  Eat healthy foods. This involves:  Eating 5 or more servings of fruits and vegetables a day.  Making dietary changes that address high blood pressure (hypertension), high cholesterol, diabetes, or obesity.  Manage your cholesterol levels.  Making food choices that are high in fiber and low in saturated fat, trans fat, and cholesterol may control cholesterol levels.  Take any prescribed medicines to control cholesterol as directed by your health care provider.  Manage your diabetes.  Controlling your carbohydrate and sugar intake is recommended to manage diabetes.  Take any prescribed medicines to control diabetes as directed by your health care provider.  Control your hypertension.  Making food choices that are low in salt (sodium), saturated fat, trans fat, and cholesterol is recommended to manage hypertension.  Ask your health care provider if you need treatment to lower your blood pressure. Take any prescribed medicines to control hypertension as directed by your health care provider.  If you are 18-39 years of age, have your blood pressure checked every 3-5 years. If you are 40 years of age or older, have your blood pressure checked every year.  Maintain a healthy weight.  Reducing calorie intake and making food choices that are low in sodium, saturated fat, trans fat, and cholesterol are recommended to manage  weight.  Stop drug abuse.  Avoid taking birth control pills.  Talk to your health care provider about the risks of taking birth control pills if you are over 35 years old, smoke, get migraines, or have ever had a blood clot.  Get evaluated for sleep disorders (sleep apnea).  Talk to your health care provider about getting a sleep evaluation if you snore a lot or have excessive sleepiness.  Take medicines only as directed by your health care provider.  For some people, aspirin or blood thinners (anticoagulants) are helpful in reducing the risk of forming abnormal blood clots that can lead to stroke. If you have the irregular heart rhythm of atrial fibrillation, you should be on a blood thinner unless there is a good reason you cannot take them.  Understand all your medicine instructions.  Make sure that other conditions (such as anemia or atherosclerosis) are addressed. Get help right away if:  You have sudden weakness or numbness of the face, arm, or leg, especially on one side of the body.  Your face or eyelid droops to one side.  You have sudden confusion.  You have trouble speaking (aphasia) or understanding.  You have sudden trouble seeing in one or both eyes.  You have sudden trouble walking.  You have dizziness.  You have a loss of balance or coordination.  You have a sudden, severe headache with no known cause.  You have new chest pain or an irregular heartbeat. Any of these symptoms may represent a serious problem that is an emergency. Do not wait to see if the symptoms will go away.   Get medical help at once. Call your local emergency services (911 in [place] Do not drive yourself to the hospital. This information is not intended to replace advice given to you by your health care provider. Make sure you discuss any questions you have with your health care provider. Document Released: 02/01/2004 Document Revised: 06/01/2015 Document Reviewed: 06/26/2012 Elsevier  Interactive Patient Education  2017 Elsevier Inc.     Preventing Cerebrovascular Disease Arteries are blood vessels that carry blood that contains oxygen from the heart to all parts of the body. Cerebrovascular disease affects arteries that supply the brain. Any condition that blocks or disrupts blood flow to the brain can cause cerebrovascular disease. Brain cells that lose blood supply start to die within minutes (stroke). Stroke is the main danger of cerebrovascular disease. Atherosclerosis and high blood pressure are common causes of cerebrovascular disease. Atherosclerosis is narrowing and hardening of an artery that results when fat, cholesterol, calcium, or other substances (plaque) build up inside an artery. Plaque reduces blood flow through the artery. High blood pressure increases the risk of bleeding inside the brain. Making diet and lifestyle changes to prevent atherosclerosis and high blood pressure lowers your risk of cerebrovascular disease. What nutrition changes can be made?  Eat more fruits, vegetables, and whole grains.  Reduce how much saturated fat you eat. To do this, eat less red meat and fewer full-fat dairy products.  Eat healthy proteins instead of red meat. Healthy proteins include:  Fish. Eat fish that contains heart-healthy omega-3 fatty acids, twice a week. Examples include salmon, albacore tuna, mackerel, and herring.  Chicken.  Nuts.  Low-fat or nonfat yogurt.  Avoid processed meats, like bacon and lunchmeat.  Avoid foods that contain:  A lot of sugar, such as sweets and drinks with added sugar.  A lot of salt (sodium). Avoid adding extra salt to your food, as told by your health care provider.  Trans fats, such as margarine and baked goods. Trans fats may be listed as "partially hydrogenated oils" on food labels.  Check food labels to see how much sodium, sugar, and trans fats are in foods.  Use vegetable oils that contain low amounts of  saturated fat, such as olive oil or canola oil. What lifestyle changes can be made?  Drink alcohol in moderation. This means no more than 1 drink a day for nonpregnant women and 2 drinks a day for men. One drink equals 12 oz of beer, 5 oz of wine, or 1 oz of hard liquor.  If you are overweight, ask your health care provider to recommend a weight-loss plan for you. Losing 5-10 lb (2.2-4.5 kg) can reduce your risk of diabetes, atherosclerosis, and high blood pressure.  Exercise for 30?60 minutes on most days, or as much as told by your health care provider.  Do moderate-intensity exercise, such as brisk walking, bicycling, and water aerobics. Ask your health care provider which activities are safe for you.  Do not use any products that contain nicotine or tobacco, such as cigarettes and e-cigarettes. If you need help quitting, ask your health care provider. Why are these changes important? Making these changes lowers your risk of many diseases that can cause cerebrovascular disease and stroke. Stroke is a leading cause of death and disability. Making these changes also improves your overall health and quality of life. What can I do to lower my risk? The following factors make you more likely to develop cerebrovascular disease:  Being overweight.  Smoking.  Being physically inactive.    Eating a high-fat diet.  Having certain health conditions, such as:  Diabetes.  High blood pressure.  Heart disease.  Atherosclerosis.  High cholesterol.  Sickle cell disease. Talk with your health care provider about your risk for cerebrovascular disease. Work with your health care provider to control diseases that you have that may contribute to cerebrovascular disease. Your health care provider may prescribe medicines to help prevent major causes of cerebrovascular disease. Where to find more information: Learn more about preventing cerebrovascular disease from:  National Heart, Lung, and  Blood Institute: www.nhlbi.nih.gov/health/health-topics/topics/stroke  Centers for Disease Control and Prevention: cdc.gov/stroke/about.htm Summary  Cerebrovascular disease can lead to a stroke.  Atherosclerosis and high blood pressure are major causes of cerebrovascular disease.  Making diet and lifestyle changes can reduce your risk of cerebrovascular disease.  Work with your health care provider to get your risk factors under control to reduce your risk of cerebrovascular disease. This information is not intended to replace advice given to you by your health care provider. Make sure you discuss any questions you have with your health care provider. Document Released: 01/08/2015 Document Revised: 07/14/2015 Document Reviewed: 01/08/2015 Elsevier Interactive Patient Education  2017 Elsevier Inc.     Steps to Quit Smoking Smoking tobacco can be bad for your health. It can also affect almost every organ in your body. Smoking puts you and people around you at risk for many serious long-lasting (chronic) diseases. Quitting smoking is hard, but it is one of the best things that you can do for your health. It is never too late to quit. What are the benefits of quitting smoking? When you quit smoking, you lower your risk for getting serious diseases and conditions. They can include:  Lung cancer or lung disease.  Heart disease.  Stroke.  Heart attack.  Not being able to have children (infertility).  Weak bones (osteoporosis) and broken bones (fractures). If you have coughing, wheezing, and shortness of breath, those symptoms may get better when you quit. You may also get sick less often. If you are pregnant, quitting smoking can help to lower your chances of having a baby of low birth weight. What can I do to help me quit smoking? Talk with your doctor about what can help you quit smoking. Some things you can do (strategies) include:  Quitting smoking totally, instead of slowly  cutting back how much you smoke over a period of time.  Going to in-person counseling. You are more likely to quit if you go to many counseling sessions.  Using resources and support systems, such as:  Online chats with a counselor.  Phone quitlines.  Printed self-help materials.  Support groups or group counseling.  Text messaging programs.  Mobile phone apps or applications.  Taking medicines. Some of these medicines may have nicotine in them. If you are pregnant or breastfeeding, do not take any medicines to quit smoking unless your doctor says it is okay. Talk with your doctor about counseling or other things that can help you. Talk with your doctor about using more than one strategy at the same time, such as taking medicines while you are also going to in-person counseling. This can help make quitting easier. What things can I do to make it easier to quit? Quitting smoking might feel very hard at first, but there is a lot that you can do to make it easier. Take these steps:  Talk to your family and friends. Ask them to support and encourage you.  Call phone   quitlines, reach out to support groups, or work with a counselor.  Ask people who smoke to not smoke around you.  Avoid places that make you want (trigger) to smoke, such as:  Bars.  Parties.  Smoke-break areas at work.  Spend time with people who do not smoke.  Lower the stress in your life. Stress can make you want to smoke. Try these things to help your stress:  Getting regular exercise.  Deep-breathing exercises.  Yoga.  Meditating.  Doing a body scan. To do this, close your eyes, focus on one area of your body at a time from head to toe, and notice which parts of your body are tense. Try to relax the muscles in those areas.  Download or buy apps on your mobile phone or tablet that can help you stick to your quit plan. There are many free apps, such as QuitGuide from the CDC (Centers for Disease Control  and Prevention). You can find more support from smokefree.gov and other websites. This information is not intended to replace advice given to you by your health care provider. Make sure you discuss any questions you have with your health care provider. Document Released: 10/20/2008 Document Revised: 08/22/2015 Document Reviewed: 05/10/2014 Elsevier Interactive Patient Education  2017 Elsevier Inc.  

## 2016-04-08 ENCOUNTER — Ambulatory Visit: Payer: Medicare Other | Admitting: Surgery

## 2016-04-11 ENCOUNTER — Other Ambulatory Visit: Payer: Self-pay | Admitting: *Deleted

## 2016-04-11 DIAGNOSIS — I739 Peripheral vascular disease, unspecified: Secondary | ICD-10-CM

## 2016-04-11 MED ORDER — CLOPIDOGREL BISULFATE 75 MG PO TABS: 75.0000 mg | ORAL_TABLET | Freq: Every day | ORAL | 3 refills | Status: DC

## 2016-04-15 ENCOUNTER — Ambulatory Visit: Payer: Medicare Other | Admitting: Surgery

## 2016-07-17 ENCOUNTER — Encounter: Payer: Self-pay | Admitting: Surgery

## 2016-07-18 ENCOUNTER — Other Ambulatory Visit: Payer: Self-pay

## 2016-07-18 DIAGNOSIS — R200 Anesthesia of skin: Secondary | ICD-10-CM

## 2016-07-18 DIAGNOSIS — I771 Stricture of artery: Secondary | ICD-10-CM

## 2016-07-29 ENCOUNTER — Ambulatory Visit (INDEPENDENT_AMBULATORY_CARE_PROVIDER_SITE_OTHER): Payer: Medicare Other | Admitting: Surgery

## 2016-07-29 ENCOUNTER — Ambulatory Visit (HOSPITAL_COMMUNITY)
Admission: RE | Admit: 2016-07-29 | Discharge: 2016-07-29 | Disposition: A | Discharge status: Home / Self Care | Payer: Medicare Other | Source: Ambulatory Visit | Attending: Surgery | Admitting: Surgery

## 2016-07-29 ENCOUNTER — Encounter: Payer: Self-pay | Admitting: Surgery

## 2016-07-29 ENCOUNTER — Ambulatory Visit (INDEPENDENT_AMBULATORY_CARE_PROVIDER_SITE_OTHER)
Admission: RE | Admit: 2016-07-29 | Discharge: 2016-07-29 | Disposition: A | Discharge status: Home / Self Care | Payer: Medicare Other | Source: Ambulatory Visit | Attending: Surgery | Admitting: Surgery

## 2016-07-29 VITALS — BP 154/70 | HR 61 | Temp 97.2°F | Resp 18 | Ht 64.0 in | Wt 141.0 lb

## 2016-07-29 DIAGNOSIS — I6523 Occlusion and stenosis of bilateral carotid arteries: Secondary | ICD-10-CM

## 2016-07-29 DIAGNOSIS — I6521 Occlusion and stenosis of right carotid artery: Secondary | ICD-10-CM | POA: Insufficient documentation

## 2016-07-29 DIAGNOSIS — R200 Anesthesia of skin: Secondary | ICD-10-CM | POA: Diagnosis not present

## 2016-07-29 DIAGNOSIS — I771 Stricture of artery: Secondary | ICD-10-CM | POA: Insufficient documentation

## 2016-07-29 DIAGNOSIS — Z9889 Other specified postprocedural states: Secondary | ICD-10-CM | POA: Diagnosis not present

## 2016-07-29 NOTE — Progress Notes (Signed)
[facility] [place]  Patient name: Kimberly [name] MRN: [id number] DOB: 28-Nov-1950 Sex: female   REASON FOR VISIT:    Follow up  [place]:    The patient is back today for follow-up. 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 She went back for lysis of adhesions secondary to abdominal pain in April. In June 2013 she underwent left carotid endarterectomy for a high-grade asymptomatic stenosis. She then later presented with dizziness and temporary vision loss. On 06/24/2012 she had stenting of her left subclavian artery. 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 On 08/13/2013 she underwent right carotid endarterectomy for an asymptomatic right carotid stenosis  She recently began having recurrent episodes of dizziness. She went for angiography because of a left subclavian in-stent stenosis on 04/27/2015. She was found to have a high-grade stenosis and this was stented. On 04/30/2015 she presented with acute ischemia to the left hand. She was taken to the operating room by Dr. fields who performed a thrombectomy. On 12/05/2015 she underwent drug coated balloon angioplasty of a left subclavian in-stent stenosis.  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  She is here today without significant complaints.  She will occasionally get numbness or left arm but otherwise she remained stable.  She continues to take aspirin and Plavix.  The patient is a smoker.  She is medically managed for hypertension which is been under good control.  She is not on cholesterol medicine for hypercholesterolemia.   PAST MEDICAL HISTORY:   Past Medical History:  Diagnosis Date  . Anemia   . Angina    March 2013  . Anxiety   . Asthma   . Blood transfusion   . Depression   . Fibromyalgia   .  History of lead poisoning 1977  . Hyperlipidemia   . Hypertension   . Migraine   . Peripheral vascular disease (HCC)    claudication  right greater than left  femoral artery  . Shortness of breath   . Small bowel obstruction, partial ([place]) 04/08/11  . Stroke ([place])   . 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 Uterine cancer ([place])    Pre cervical     FAMILY HISTORY:   Family History  Problem Relation Age of Onset  . Cancer Mother        breast, colon, ovarian  . Cancer Brother 50       lung  . Hyperlipidemia Brother   . Cancer Other   . Stroke Other   . Cancer Maternal Grandmother        ovarian  . Anesthesia problems Neg Hx     SOCIAL HISTORY:   Social History  Substance Use Topics  . Smoking status: Current Some Day Smoker    Packs/day: 0.10    Years: 45.00    Types: Cigarettes  . Smokeless tobacco: Never Used     Comment: pt states that she smokes about 6 cigs per day  . Alcohol use No     ALLERGIES:   Allergies  Allergen Reactions  . Codeine Nausea And Vomiting    Dizziness, headache, has passed out  . Hydrocodone Nausea And Vomiting and Other (See Comments)    Headache and dizziness  . Other Shortness Of Breath    Allergy to cologne/perfume base  . Oxycodone Nausea And Vomiting and Other (See Comments)  Dizziness   . Percocet [Oxycodone-Acetaminophen] Nausea And Vomiting  . Vicodin [Hydrocodone-Acetaminophen] Nausea And Vomiting    Headache and dizziness     CURRENT MEDICATIONS:   Current Outpatient Prescriptions  Medication Sig Dispense Refill  . aspirin 325 MG tablet Take 325 mg by mouth daily.    Marland Kitchen albuterol (PROVENTIL HFA;VENTOLIN HFA) 108 (90 Base) MCG/ACT inhaler Inhale 1-2 puffs into the lungs every 6 (six) hours as needed for wheezing or shortness of breath.    . clopidogrel (PLAVIX) 75 MG tablet Take 1 tablet (75 mg total) by mouth daily. 90 tablet 3  . ibuprofen (ADVIL,MOTRIN) 200 MG tablet Take 200 mg by mouth every 6 (six) hours  as needed for moderate pain (pain).      No current facility-administered medications for this visit.     REVIEW OF SYSTEMS:   [X]  denotes positive finding, [ ]  denotes negative finding Cardiac  Comments:  Chest pain or chest pressure:    Shortness of breath upon exertion:    Short of breath when lying flat:    Irregular heart rhythm:        Vascular    Pain in calf, thigh, or hip brought on by ambulation:    Pain in feet at night that wakes you up from your sleep:     Blood clot in your veins:    Leg swelling:         Pulmonary    Oxygen at home:    Productive cough:     Wheezing:         Neurologic    Sudden weakness in arms or legs:     Sudden numbness in arms or legs:     Sudden onset of difficulty speaking or slurred speech:    Temporary loss of vision in one eye:     Problems with dizziness:         Gastrointestinal    Blood in stool:     Vomited blood:         Genitourinary    Burning when urinating:     Blood in urine:        Psychiatric    Major depression:         Hematologic    Bleeding problems:    Problems with blood clotting too easily:        Skin    Rashes or ulcers:        Constitutional    Fever or chills:      PHYSICAL EXAM:   Vitals:   07/29/16 0925 07/29/16 0927  BP: 123/72 (!) 154/70  Pulse: 62 61  Resp: 18   Temp: (!) 97.2 F (36.2 C)   SpO2: 99%   Weight: 141 lb (64 kg)   Height: 5\' 4"  (1.626 m)     GENERAL: The patient is a well-nourished female, in no acute distress. The vital signs are documented above. CARDIAC: There is a regular rate and rhythm.  VASCULAR: Palpable left upper arm brachial pulse.  The left radial pulse is not palpable.  She has a palpable right radial pulse PULMONARY: Non-labored respirations MUSCULOSKELETAL: There are no major deformities or cyanosis. NEUROLOGIC: No focal weakness or paresthesias are detected. SKIN: There are no ulcers or rashes noted. PSYCHIATRIC: The patient has a normal  affect.  STUDIES:   I have ordered and reviewed her vascular lab studies.  There has been progression of her right carotid stenosis now on the 40-59 percent category.  The left  remains 1-39 percent.  Elevated velocities within the distal brachial artery with peak velocity of 402.  MEDICAL ISSUES:   Carotid: Slight progression of the stenosis within the right carotid artery at the distal patch.  This will be followed up in 6 months with a repeat ultrasound  Subclavian/brachial artery: Elevated velocities within the left brachial artery and possibly within the left subclavian stent.  The patient has a palpable upper arm brachial pulse but not a left radial pulse.  Her symptoms remain minimal at this time.  Therefore I will repeat her ultrasound in 6 months.  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  This will need to be followed closely.  Hypercholesterolemia: The patient is currently not on any medications.  We'll consider starting a statin.  Annamarie Major, MD [facility] [phone] Pager [phone]

## 2016-08-01 NOTE — Addendum Note (Signed)
Addended by: Lianne Cure A on: 08/01/2016 12:15 PM   Modules accepted: Orders

## 2017-01-29 ENCOUNTER — Other Ambulatory Visit (HOSPITAL_COMMUNITY): Payer: Medicare Other

## 2017-01-29 ENCOUNTER — Ambulatory Visit: Payer: Medicare Other | Admitting: Family

## 2017-01-29 ENCOUNTER — Encounter (HOSPITAL_COMMUNITY): Payer: Medicare Other

## 2017-01-30 ENCOUNTER — Encounter: Payer: Self-pay | Admitting: Family

## 2017-01-30 ENCOUNTER — Other Ambulatory Visit: Payer: Self-pay

## 2017-01-30 ENCOUNTER — Ambulatory Visit (HOSPITAL_COMMUNITY)
Admission: RE | Admit: 2017-01-30 | Discharge: 2017-01-30 | Disposition: A | Discharge status: Home / Self Care | Payer: Medicare Other | Source: Ambulatory Visit | Attending: Surgery | Admitting: Surgery

## 2017-01-30 ENCOUNTER — Ambulatory Visit (INDEPENDENT_AMBULATORY_CARE_PROVIDER_SITE_OTHER)
Admission: RE | Admit: 2017-01-30 | Discharge: 2017-01-30 | Disposition: A | Discharge status: Home / Self Care | Payer: Medicare Other | Source: Ambulatory Visit | Attending: Surgery | Admitting: Surgery

## 2017-01-30 ENCOUNTER — Ambulatory Visit (INDEPENDENT_AMBULATORY_CARE_PROVIDER_SITE_OTHER): Payer: Medicare Other | Admitting: Family

## 2017-01-30 VITALS — BP 141/67 | HR 73 | Temp 98.0°F | Resp 18 | Ht 64.0 in | Wt 141.8 lb

## 2017-01-30 DIAGNOSIS — Z959 Presence of cardiac and vascular implant and graft, unspecified: Secondary | ICD-10-CM | POA: Diagnosis not present

## 2017-01-30 DIAGNOSIS — Z9889 Other specified postprocedural states: Secondary | ICD-10-CM

## 2017-01-30 DIAGNOSIS — F17200 Nicotine dependence, unspecified, uncomplicated: Secondary | ICD-10-CM | POA: Insufficient documentation

## 2017-01-30 DIAGNOSIS — I70208 Unspecified atherosclerosis of native arteries of extremities, other extremity: Secondary | ICD-10-CM | POA: Diagnosis not present

## 2017-01-30 DIAGNOSIS — I6523 Occlusion and stenosis of bilateral carotid arteries: Secondary | ICD-10-CM | POA: Insufficient documentation

## 2017-01-30 DIAGNOSIS — I779 Disorder of arteries and arterioles, unspecified: Secondary | ICD-10-CM | POA: Diagnosis not present

## 2017-01-30 DIAGNOSIS — I771 Stricture of artery: Secondary | ICD-10-CM

## 2017-01-30 LAB — VAS US CAROTID
LEFT ECA DIAS: -17 cm/s
LEFT VERTEBRAL DIAS: 14 cm/s
Left CCA dist dias: 26 cm/s
Left CCA dist sys: 114 cm/s
Left CCA prox dias: 21 cm/s
Left CCA prox sys: 95 cm/s
Left ICA dist dias: -32 cm/s
Left ICA dist sys: -115 cm/s
Left ICA prox dias: -21 cm/s
Left ICA prox sys: -90 cm/s
RIGHT CCA MID DIAS: 15 cm/s
RIGHT ECA DIAS: -16 cm/s
RIGHT VERTEBRAL DIAS: 30 cm/s
Right CCA prox dias: 14 cm/s
Right CCA prox sys: 102 cm/s
Right cca dist sys: -95 cm/s

## 2017-01-30 NOTE — Patient Instructions (Signed)
Steps to Quit Smoking Smoking tobacco can be bad for your health. It can also affect almost every organ in your body. Smoking puts you and people around you at risk for many serious long-lasting (chronic) diseases. Quitting smoking is hard, but it is one of the best things that you can do for your health. It is never too late to quit. What are the benefits of quitting smoking? When you quit smoking, you lower your risk for getting serious diseases and conditions. They can include:  Lung cancer or lung disease.  Heart disease.  Stroke.  Heart attack.  Not being able to have children (infertility).  Weak bones (osteoporosis) and broken bones (fractures).  If you have coughing, wheezing, and shortness of breath, those symptoms may get better when you quit. You may also get sick less often. If you are pregnant, quitting smoking can help to lower your chances of having a baby of low birth weight. What can I do to help me quit smoking? Talk with your doctor about what can help you quit smoking. Some things you can do (strategies) include:  Quitting smoking totally, instead of slowly cutting back how much you smoke over a period of time.  Going to in-person counseling. You are more likely to quit if you go to many counseling sessions.  Using resources and support systems, such as: ? Online chats with a counselor. ? Phone quitlines. ? Printed self-help materials. ? Support groups or group counseling. ? Text messaging programs. ? Mobile phone apps or applications.  Taking medicines. Some of these medicines may have nicotine in them. If you are pregnant or breastfeeding, do not take any medicines to quit smoking unless your doctor says it is okay. Talk with your doctor about counseling or other things that can help you.  Talk with your doctor about using more than one strategy at the same time, such as taking medicines while you are also going to in-person counseling. This can help make  quitting easier. What things can I do to make it easier to quit? Quitting smoking might feel very hard at first, but there is a lot that you can do to make it easier. Take these steps:  Talk to your family and friends. Ask them to support and encourage you.  Call phone quitlines, reach out to support groups, or work with a counselor.  Ask people who smoke to not smoke around you.  Avoid places that make you want (trigger) to smoke, such as: ? Bars. ? Parties. ? Smoke-break areas at work.  Spend time with people who do not smoke.  Lower the stress in your life. Stress can make you want to smoke. Try these things to help your stress: ? Getting regular exercise. ? Deep-breathing exercises. ? Yoga. ? Meditating. ? Doing a body scan. To do this, close your eyes, focus on one area of your body at a time from head to toe, and notice which parts of your body are tense. Try to relax the muscles in those areas.  Download or buy apps on your mobile phone or tablet that can help you stick to your quit plan. There are many free apps, such as QuitGuide from the CDC (Centers for Disease Control and Prevention). You can find more support from smokefree.gov and other websites.  This information is not intended to replace advice given to you by your health care provider. Make sure you discuss any questions you have with your health care provider. Document Released: 10/20/2008 Document   Revised: 08/22/2015 Document Reviewed: 05/10/2014 Elsevier Interactive Patient Education  2018 Elsevier Inc.   Stroke Prevention Some health problems and behaviors may make it more likely for you to have a stroke. Below are ways to lessen your risk of having a stroke.  Be active for at least 30 minutes on most or all days.  Do not smoke. Try not to be around others who smoke.  Do not drink too much alcohol. ? Do not have more than 2 drinks a day if you are a man. ? Do not have more than 1 drink a day if you are a  woman and are not pregnant.  Eat healthy foods, such as fruits and vegetables. If you were put on a specific diet, follow the diet as told.  Keep your cholesterol levels under control through diet and medicines. Look for foods that are low in saturated fat, trans fat, cholesterol, and are high in fiber.  If you have diabetes, follow all diet plans and take your medicine as told.  Ask your doctor if you need treatment to lower your blood pressure. If you have high blood pressure (hypertension), follow all diet plans and take your medicine as told by your doctor.  If you are 18-39 years old, have your blood pressure checked every 3-5 years. If you are age 40 or older, have your blood pressure checked every year.  Keep a healthy weight. Eat foods that are low in calories, salt, saturated fat, trans fat, and cholesterol.  Do not take drugs.  Avoid birth control pills, if this applies. Talk to your doctor about the risks of taking birth control pills.  Talk to your doctor if you have sleep problems (sleep apnea).  Take all medicine as told by your doctor. ? You may be told to take aspirin or blood thinner medicine. Take this medicine as told by your doctor. ? Understand your medicine instructions.  Make sure any other conditions you have are being taken care of.  Get help right away if:  You suddenly lose feeling (you feel numb) or have weakness in your face, arm, or leg.  Your face or eyelid hangs down to one side.  You suddenly feel confused.  You have trouble talking (aphasia) or understanding what people are saying.  You suddenly have trouble seeing in one or both eyes.  You suddenly have trouble walking.  You are dizzy.  You lose your balance or your movements are clumsy (uncoordinated).  You suddenly have a very bad headache and you do not know the cause.  You have new chest pain.  Your heart feels like it is fluttering or skipping a beat (irregular heartbeat). Do  not wait to see if the symptoms above go away. Get help right away. Call your local emergency services (911 in [place] Do not drive yourself to the hospital. This information is not intended to replace advice given to you by your health care provider. Make sure you discuss any questions you have with your health care provider. Document Released: 06/25/2011 Document Revised: 06/01/2015 Document Reviewed: 06/26/2012 Elsevier Interactive Patient Education  2018 Elsevier Inc.     Peripheral Vascular Disease Peripheral vascular disease (PVD) is a disease of the blood vessels that are not part of your heart and brain. A simple term for PVD is poor circulation. In most cases, PVD narrows the blood vessels that carry blood from your heart to the rest of your body. This can result in a decreased supply of blood to   your arms, legs, and internal organs, like your stomach or kidneys. However, it most often affects a person's lower legs and feet. There are two types of PVD.  Organic PVD. This is the more common type. It is caused by damage to the structure of blood vessels.  Functional PVD. This is caused by conditions that make blood vessels contract and tighten (spasm).  Without treatment, PVD tends to get worse over time. PVD can also lead to acute ischemic limb. This is when an arm or limb suddenly has trouble getting enough blood. This is a medical emergency. Follow these instructions at home:  Take medicines only as told by your doctor.  Do not use any tobacco products, including cigarettes, chewing tobacco, or electronic cigarettes. If you need help quitting, ask your doctor.  Lose weight if you are overweight, and maintain a healthy weight as told by your doctor.  Eat a diet that is low in fat and cholesterol. If you need help, ask your doctor.  Exercise regularly. Ask your doctor for some good activities for you.  Take good care of your feet. ? Wear comfortable shoes that fit  well. ? Check your feet often for any cuts or sores. Contact a doctor if:  You have cramps in your legs while walking.  You have leg pain when you are at rest.  You have coldness in a leg or foot.  Your skin changes.  You are unable to get or have an erection (erectile dysfunction).  You have cuts or sores on your feet that are not healing. Get help right away if:  Your arm or leg turns cold and blue.  Your arms or legs become red, warm, swollen, painful, or numb.  You have chest pain or trouble breathing.  You suddenly have weakness in your face, arm, or leg.  You become very confused or you cannot speak.  You suddenly have a very bad headache.  You suddenly cannot see. This information is not intended to replace advice given to you by your health care provider. Make sure you discuss any questions you have with your health care provider. Document Released: 03/20/2009 Document Revised: 06/01/2015 Document Reviewed: 06/03/2013 Elsevier Interactive Patient Education  2017 Elsevier Inc.  

## 2017-01-30 NOTE — Progress Notes (Signed)
Chief Complaint: Follow up Extracranial Carotid Artery Stenosis   History of Present Illness  Kimberly [name] is a 67 y.o. female who is status post aortobifemoral bypass graft on 02/26/2011. She went back for lysis of adhesions secondary to abdominal pain in April 2013  She is also s/p brachial, ulnar, and radial artery thrombectomy for aute ischemia of left hand on 04/30/15. Postoperatively she had a 2+ palpable left radial pulse. Her left radial, ulnar, and brachial pulses are not palpable today, but brachial pressure in left arm is 109/68, in right arm is 158/73.  Re left subclavian stenosis, she iss/p stent placement on 06/24/2012, then subsequent drug coated balloon angioplasty on 08/12/2013 and 12-05-15 for in-stent stenosis within her left subclavian artery.  She is also s/p left carotid endarterectomy for a high-grade asymptomatic stenosis in June 2013, and right carotid endarterectomy for an asymptomatic right carotid artery stenosis on 08/13/2013.   She had a bilateral occular TIA about 2015, denies speech difficulties, denies hemiparesis other than the ongoing left upper arm numbness.   Dr. Trula Slade last evaluated pt on 07-29-16. At that time there had been progression of her right carotid stenosis to 40-59 percent category.  The left remained 1-39 percent. Elevated velocities within the distal brachial artery with peak velocity of 402. Carotid: Slight progression of the stenosis within the right carotid artery at the distal patch.  This was to be be followed up in 6 months with a repeat ultrasound Subclavian/brachial artery: Elevated velocities within the left brachial artery and possibly within the left subclavian stent.  The patient had a palpable upper arm brachial pulse but not a left radial pulse.  Her symptoms remained minimal at that time.  Therefore Dr. Trula Slade advised to repeat her ultrasound in 6 months. 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  This will need to be followed closely. Hypercholesterolemia: The patient is currently not on any medications.  We'll consider starting a statin. (pt has no PCP).  She has chronic low back and posterior neck pain, states she has not had this evaluated.   She has no PCP, states she cannot afford one. She relates her financial situation to the Medicaid status of her grandchildren.   Left arm is hurting, unclear when this started, was not bothersome at her 07-29-16 visit.  She also states her right hand feels numb x 2 months.    Pt Diabetic: no Pt smoker: smoker (1/3ppd x 40+yrs)  Pt meds include: Statin : no, states was advised for her but she declined ASA: yes Other anticoagulants/antiplatelets: Plavix  Past Medical History:  Diagnosis Date  . Anemia   . Angina    March 2013  . Anxiety   . Asthma   . Blood transfusion   . Depression   . Fibromyalgia   . History of lead poisoning 1977  . Hyperlipidemia   . Hypertension   . Migraine   . Peripheral vascular disease (HCC)    claudication  right greater than left  femoral artery  . Shortness of breath   . Small bowel obstruction, partial ([place]) 04/08/11  . Stroke ([place])   . 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 Uterine cancer ([place])    Pre cervical    Social History Social History   Tobacco Use  . Smoking status: Current Some Day Smoker    Packs/day: 0.10    Years: 45.00    Pack years: 4.50  Types: Cigarettes  . Smokeless tobacco: Never Used  . Tobacco comment: pt states that she smokes about 6 cigs per day  Substance Use Topics  . Alcohol use: No    Alcohol/week: 0.0 oz  . Drug use: No    Comment: History of Cocaine and marijuana abuse: "not in many years" (04/08/11    Family History Family History  Problem Relation Age of Onset  . Cancer Mother        breast, colon, ovarian  . Cancer Brother 50       lung  . Hyperlipidemia Brother   . Cancer Other   . Stroke Other   .  Cancer Maternal Grandmother        ovarian  . Anesthesia problems Neg Hx     Surgical History Past Surgical History:  Procedure Laterality Date  . abdominal angiogram    . ABDOMINAL ANGIOGRAM N/A 02/05/2011   Procedure: ABDOMINAL ANGIOGRAM;  Surgeon: Serafina Mitchell, MD;  Location: [facility] CATH LAB;  Service: Cardiovascular;  Laterality: N/A;  . ABDOMINAL HYSTERECTOMY  2008  . AORTA - BILATERAL FEMORAL ARTERY BYPASS GRAFT  02/26/2011   Procedure: AORTA BIFEMORAL BYPASS GRAFT;  Surgeon: Theotis Burrow, MD;  Location: [place];  Service: Vascular;  Laterality: N/A;  . ARCH AORTOGRAM N/A 06/24/2012   Procedure: ARCH AORTOGRAM;  Surgeon: Serafina Mitchell, MD;  Location: [place] CATH LAB;  Service: Cardiovascular;  Laterality: N/A;  . BILATERAL UPPER EXTREMITY ANGIOGRAM Left 08/12/2013   Procedure: UPPER EXTREMITY ANGIOGRAM;  Surgeon: Serafina Mitchell, MD;  Location: [facility] CATH LAB;  Service: Cardiovascular;  Laterality: Left;  . CAROTID ENDARTERECTOMY Left 06/13/2011   Left  CEA  . CAROTID ENDARTERECTOMY Right 08-13-13   CEA  . CHOLECYSTECTOMY N/A 12/08/2012   Procedure: LAPAROSCOPIC CHOLECYSTECTOMY WITH INTRAOPERATIVE CHOLANGIOGRAM;  Surgeon: Haywood Lasso, MD;  Location: [place];  Service: General;  Laterality: N/A;  . COLONOSCOPY WITH PROPOFOL N/A 06/16/2014   Procedure: COLONOSCOPY WITH PROPOFOL;  Surgeon: Garlan Fair, MD;  Location: [facility] ENDOSCOPY;  Service: Endoscopy;  Laterality: N/A;  . EMBOLECTOMY Left 04/30/2015   Procedure: EMBOLECTOMY BRACHIAL, Radial and ulnar arteries with patch angioplasty of brachial artery.;  Surgeon: Elam Dutch, MD;  Location: [facility] OR;  Service: Vascular;  Laterality: Left;  . ENDARTERECTOMY  06/13/2011   Procedure: ENDARTERECTOMY CAROTID;  Surgeon: Serafina Mitchell, MD;  Location: [facility] OR;  Service: Vascular;  Laterality: Left;  Left carotid artery endarterectomy with vascu-guard patch angioplasty  . ENDARTERECTOMY Right 08/13/2013   Procedure: RIGHT  CAROTID ENDARTERECTOMY  CAROTID WITH BOVINE PERICARDIAL PATCH ANGIOPLASTY;  Surgeon: Serafina Mitchell, MD;  Location: [place] OR;  Service: Vascular;  Laterality: Right;  . ESOPHAGOGASTRODUODENOSCOPY (EGD) WITH PROPOFOL N/A 06/16/2014   Procedure: ESOPHAGOGASTRODUODENOSCOPY (EGD) WITH PROPOFOL;  Surgeon: Garlan Fair, MD;  Location: [facility] ENDOSCOPY;  Service: Endoscopy;  Laterality: N/A;  . LAPAROTOMY  04/10/2011   Procedure: EXPLORATORY LAPAROTOMY;  Surgeon: Shann Medal, MD;  Location: [place];  Service: General;  Laterality: N/A;  ENTEROLYSIS OF ADHESIONS  . LOWER EXTREMITY ANGIOGRAM Left 06/24/12   Left stent intervention, left subclavian  . PERIPHERAL VASCULAR CATHETERIZATION N/A 04/26/2015   Procedure: Aortic Arch Angiography;  Surgeon: Serafina Mitchell, MD;  Location: [place] CV LAB;  Service: Cardiovascular;  Laterality: N/A;  . PERIPHERAL VASCULAR CATHETERIZATION  04/26/2015   Procedure: Peripheral Vascular Intervention;  Surgeon: Serafina Mitchell, MD;  Location: [place] CV LAB;  Service: Cardiovascular;;  lt illiac  .  PERIPHERAL VASCULAR CATHETERIZATION N/A 12/05/2015   Procedure: Aortic Arch Angiography;  Surgeon: Serafina Mitchell, MD;  Location: [place] CV LAB;  Service: Cardiovascular;  Laterality: N/A;  . PERIPHERAL VASCULAR CATHETERIZATION Left 12/05/2015   Procedure: Peripheral Vascular Intervention;  Surgeon: Serafina Mitchell, MD;  Location: [place] CV LAB;  Service: Cardiovascular;  Laterality: Left;  subclavian  . PR VEIN BYPASS GRAFT,AORTO-FEM-POP  02/26/11    Allergies  Allergen Reactions  . Codeine Nausea And Vomiting    Dizziness, headache, has passed out  . Hydrocodone Nausea And Vomiting and Other (See Comments)    Headache and dizziness  . Other Shortness Of Breath    Allergy to cologne/perfume base  . Oxycodone Nausea And Vomiting and Other (See Comments)    Dizziness   . Percocet [Oxycodone-Acetaminophen] Nausea And Vomiting  . Vicodin [Hydrocodone-Acetaminophen] Nausea And Vomiting     Headache and dizziness    Current Outpatient Medications  Medication Sig Dispense Refill  . aspirin 325 MG tablet Take 325 mg by mouth daily.    . clopidogrel (PLAVIX) 75 MG tablet Take 1 tablet (75 mg total) by mouth daily. 90 tablet 3  . ibuprofen (ADVIL,MOTRIN) 200 MG tablet Take 200 mg by mouth every 6 (six) hours as needed for moderate pain (pain).     Marland Kitchen albuterol (PROVENTIL HFA;VENTOLIN HFA) 108 (90 Base) MCG/ACT inhaler Inhale 1-2 puffs into the lungs every 6 (six) hours as needed for wheezing or shortness of breath.     No current facility-administered medications for this visit.     Review of Systems : See HPI for pertinent positives and negatives.  Physical Examination  Vitals:   01/30/17 1316 01/30/17 1319  BP: (!) 141/71 (!) 141/67  Pulse: 73   Resp: 18   Temp: 98 F (36.7 C)   TempSrc: Oral   SpO2: 96%   Weight: 141 lb 12.8 oz (64.3 kg)   Height: 5\' 4"  (1.626 m)    Body mass index is 24.34 kg/m.  General: WDWN femalein NAD GAIT:normal HENT:  Poor dentition  Eyes: PERRLA Pulmonary: Respirations are non-labored, fairair movement in all fields, CTAB, occasional moist cough Cardiac: regularrhythm, nodetected murmur.  VASCULAR EXAM Carotid Bruits Right Left   Positive Positive   Abdominal aortic pulse is notpalpable. Radial pulses: right is 2+.Left radial and ulnar pulses arenot palpable. Left brachial pulse is faintly palpable.   LE Pulses Right Left  FEMORAL 2+palpable 2+palpable   POPLITEAL notpalpable  notpalpable  POSTERIOR TIBIAL 2+palpable  1+palpable   DORSALIS PEDIS ANTERIOR TIBIAL 2+palpable  2+palpable     Gastrointestinal: soft, nontender, BS WNL, no r/g, nopalpable masses. Musculoskeletal: Nomuscle atrophy/wasting. M/S 3/5 throughout, extremities without ischemic changes.Painful SLR on the right. Painful to moderate touch at bilateral popliteal  spaces, bilateral ankles.  Skin: No rash, no cellulitis, no ulcers noted. Neurologic: A&O X 3; appropriate affect, speech is normal, CN 2-12 intact, left hand is less sensitive to touch than right, Motor exam as listed above Psychiatric: Normal thought content, mood appropriate to clinical situation.     Assessment: Kimberly [name] is a 67 y.o. female who is status post aortobifemoral bypass graft on 02/26/2011. She went back for lysis of adhesions secondary to abdominal pain in April of 2013.   She is also s/p brachial, ulnar, and radial artery thrombectomy for aute ischemia of left hand on 04/30/15. Postoperatively she had a 2+ palpable left radial pulse. Bilateral brachial pressures are equal today.   Re left subclavian stenosis,  she iss/p stent placement on 06/24/2012, then subsequent drug coated balloon angioplasty on 08/12/2013 and 12-05-15 for in-stent stenosis within her left subclavian artery.  She is also s/p left carotid endarterectomy for a high-grade asymptomatic stenosis in June 2013, and right carotid endarterectomy for an asymptomatic right carotid artery stenosis on 08/13/2013.   Active smoking, her primary atherosclerotic risk factor - The patient was again counseled re smoking cessation and given several free resources re smoking cessation.  I discussed with Dr. Trula Slade (03-11-16 visit) the increased velocity in the proximal left subclavian stent (421 cm/s, compared to 356 cm/s on 11-07-15), lower blood pressure in left arm compared to right, non palpable left radial, ulnar, and brachial pulses, and continued tingling and numbness in left upper arm.   Right radial pulse is 2+ palpable.Left radial and ulnar pulses arenot palpable. Left brachial pulse is faintly palpable.    DATA Carotid Duplex (01/30/17): Right ICA: 40-59% stenosis. Left ICA: 1-39% stenosis.  >50% stenosis noted in the left upper extremity.  Obstruction noted in the left brachial artery (353 cm/s  today, 355 cm/s on 07-29-16) and subclavian artery (458 cm/s today, 402 cm/s on 07-29-16). No significant change compared to the exam on 07-29-16.    Plan: Follow-up in 2-4 weeks with Dr. Trula Slade and discuss left arm pain and right hand numbness, no change in left brachial artery stenosis, has not had her c-spine nor L-spine evaluated, she does not have PCP. [facility] to [facility]CP.   The patient was counseled re smoking cessation and given several free resources re smoking cessation.   I discussed in depth with the patient the nature of atherosclerosis, and emphasized the importance of maximal medical management including strict control of blood pressure, blood glucose, and lipid levels, obtaining regular exercise, and cessation of smoking.  The patient is aware that without maximal medical management the underlying atherosclerotic disease process will progress, limiting the benefit of any interventions. The patient was given information about stroke prevention and what symptoms should prompt the patient to seek immediate medical care. Thank you for allowing [place] to participate in this patient's care.  Clemon Chambers, RN, MSN, FNP-C [facility] Office: [phone]  Clinic Physician: Oneida Alar  01/30/17 1:23 PM

## 2017-02-17 ENCOUNTER — Other Ambulatory Visit: Payer: Self-pay | Admitting: Surgery

## 2017-02-17 MED ORDER — CLOPIDOGREL BISULFATE 75 MG PO TABS: 75.0000 mg | ORAL_TABLET | Freq: Every day | ORAL | 3 refills | Status: DC

## 2017-02-24 ENCOUNTER — Encounter: Payer: Self-pay | Admitting: Surgery

## 2017-02-24 ENCOUNTER — Ambulatory Visit (INDEPENDENT_AMBULATORY_CARE_PROVIDER_SITE_OTHER): Payer: Medicare Other | Admitting: Surgery

## 2017-02-24 ENCOUNTER — Other Ambulatory Visit: Payer: Self-pay

## 2017-02-24 VITALS — BP 131/77 | HR 71 | Temp 97.1°F | Resp 16 | Ht 64.0 in | Wt 138.0 lb

## 2017-02-24 DIAGNOSIS — I771 Stricture of artery: Secondary | ICD-10-CM | POA: Diagnosis not present

## 2017-02-24 DIAGNOSIS — I6523 Occlusion and stenosis of bilateral carotid arteries: Secondary | ICD-10-CM | POA: Diagnosis not present

## 2017-02-24 DIAGNOSIS — I779 Disorder of arteries and arterioles, unspecified: Secondary | ICD-10-CM

## 2017-02-24 NOTE — Progress Notes (Signed)
[facility] of [place]  Patient name: Kimberly [name] MRN: [id number] DOB: [birth year] Sex: female   REASON FOR VISIT:    Follow up  [place]:   The patient is back today for follow-up. 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 She went back for lysis of adhesions secondary to abdominal pain in April. In June 2013 she underwent left carotid endarterectomy for a high-grade asymptomatic stenosis. She then later presented with dizziness and temporary vision loss. On 06/24/2012 she had stenting of her left subclavian artery. 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 On 08/13/2013 she underwent right carotid endarterectomy for an asymptomatic right carotid stenosis  She recently began having recurrent episodes of dizziness. She went for angiography because of a left subclavian in-stent stenosis on 04/27/2015. She was found to have a high-grade stenosis and this was stented. On 04/30/2015 she presented with acute ischemia to the left hand. She was taken to the operating room by Dr. fields who performed a thrombectomy. On 12/05/2015 she underwent drug coated balloon angioplasty of a left subclavian in-stent stenosis.  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  She is here today without significant complaints.  She will occasionally get numbness or left arm but otherwise she remained stable.  She continues to take aspirin and Plavix, but has cut her aspirin back to 81 mg.  She states that she will get cramps at night in her legs.  She denies any symptoms of claudication.  The patient is a smoker.  She is medically managed for hypertension which is been under good control.  She is not on cholesterol medicine for hypercholesterolemia   PAST MEDICAL HISTORY:   Past Medical History:    Diagnosis Date  . Anemia   . Angina    March 2013  . Anxiety   . Asthma   . Blood transfusion   . Depression   . Fibromyalgia   . History of lead poisoning 1977  . Hyperlipidemia   . Hypertension   . Migraine   . Peripheral vascular disease (HCC)    claudication  right greater than left  femoral artery  . Shortness of breath   . Small bowel obstruction, partial ([place]) 04/08/11  . Stroke ([place])   . 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 Uterine cancer ([place])    Pre cervical     FAMILY HISTORY:   Family History  Problem Relation Age of Onset  . Cancer Mother        breast, colon, ovarian  . Cancer Brother 50       lung  . Hyperlipidemia Brother   . Cancer Other   . Stroke Other   . Cancer Maternal Grandmother        ovarian  . Anesthesia problems Neg Hx     SOCIAL HISTORY:   Social History   Tobacco Use  . Smoking status: Current Some Day Smoker    Packs/day: 0.10    Years: 45.00    Pack years: 4.50    Types: Cigarettes  . Smokeless tobacco: Never Used  . Tobacco comment: pt states that she smokes about 6 cigs per day  Substance Use Topics  . Alcohol use: No    Alcohol/week: 0.0 oz     ALLERGIES:   Allergies  Allergen Reactions  . Codeine Nausea And Vomiting  Dizziness, headache, has passed out  . Hydrocodone Nausea And Vomiting and Other (See Comments)    Headache and dizziness  . Other Shortness Of Breath    Allergy to cologne/perfume base  . Oxycodone Nausea And Vomiting and Other (See Comments)    Dizziness   . Percocet [Oxycodone-Acetaminophen] Nausea And Vomiting  . Vicodin [Hydrocodone-Acetaminophen] Nausea And Vomiting    Headache and dizziness     CURRENT MEDICATIONS:   Current Outpatient Medications  Medication Sig Dispense Refill  . albuterol (PROVENTIL HFA;VENTOLIN HFA) 108 (90 Base) MCG/ACT inhaler Inhale 1-2 puffs into the lungs every 6 (six) hours as needed for wheezing or shortness of breath.    Marland Kitchen aspirin 325 MG tablet  Take 325 mg by mouth daily.    . clopidogrel (PLAVIX) 75 MG tablet Take 1 tablet (75 mg total) by mouth daily. 90 tablet 3  . ibuprofen (ADVIL,MOTRIN) 200 MG tablet Take 200 mg by mouth every 6 (six) hours as needed for moderate pain (pain).      No current facility-administered medications for this visit.     REVIEW OF SYSTEMS:   [X]  denotes positive finding, [ ]  denotes negative finding Cardiac  Comments:  Chest pain or chest pressure:    Shortness of breath upon exertion:    Short of breath when lying flat:    Irregular heart rhythm:        Vascular    Pain in calf, thigh, or hip brought on by ambulation:    Pain in feet at night that wakes you up from your sleep:  x   Blood clot in your veins:    Leg swelling:         Pulmonary    Oxygen at home:    Productive cough:     Wheezing:         Neurologic    Sudden weakness in arms or legs:  x   Sudden numbness in arms or legs:  x   Sudden onset of difficulty speaking or slurred speech:    Temporary loss of vision in one eye:     Problems with dizziness:         Gastrointestinal    Blood in stool:     Vomited blood:         Genitourinary    Burning when urinating:     Blood in urine:        Psychiatric    Major depression:         Hematologic    Bleeding problems:    Problems with blood clotting too easily:        Skin    Rashes or ulcers:        Constitutional    Fever or chills:      PHYSICAL EXAM:   There were no vitals filed for this visit.  GENERAL: The patient is a well-nourished female, in no acute distress. The vital signs are documented above. CARDIAC: There is a regular rate and rhythm.  VASCULAR: Palpable bilateral dorsalis pedis pulses.  Palpable left brachial pulse PULMONARY: Non-labored respirations ABDOMEN: Soft and non-tender with normal pitched bowel sounds.  MUSCULOSKELETAL: There are no major deformities or cyanosis. NEUROLOGIC: No focal weakness or paresthesias are detected. SKIN:  There are no ulcers or rashes noted. PSYCHIATRIC: The patient has a normal affect.  STUDIES:   I have ordered and reviewed her vascular lab studies with the following results  Upper extremity arterial: Greater than 50% stenosis within the  left upper extremity obstruction noted in the brachial artery and subclavian artery.  Subclavian velocities are 458.  Brachial velocities are 353  Carotid duplex: Right equals 40-59%.  Left equals 1-39%.  MEDICAL ISSUES:   Carotid stenosis: Follow-up ultrasound in 6 months  Subclavian stenosis: The patient's symptoms have remained stable over the course of the past 6 months.  We discussed repeating the study in 6 months.  If her velocity profile has increased, we will proceed with angiography via a femoral approach.  Leg cramps: These are not related to arterial insufficiency as she has palpable pedal pulses.    Annamarie Major, MD [facility] Specialists of [facility] [phone] Pager [phone]

## 2017-02-24 NOTE — Progress Notes (Signed)
Vitals:   02/24/17 0833 02/24/17 0836  BP: 127/69 (!) 142/78  Pulse: 71 71  Resp: 16   Temp: (!) 97.1 F (36.2 C)   TempSrc: Oral   SpO2: 99%   Weight: 138 lb (62.6 kg)   Height: 5\' 4"  (1.626 m)

## 2017-02-25 ENCOUNTER — Other Ambulatory Visit: Payer: Self-pay

## 2017-02-25 DIAGNOSIS — I771 Stricture of artery: Secondary | ICD-10-CM

## 2017-02-25 DIAGNOSIS — I6523 Occlusion and stenosis of bilateral carotid arteries: Secondary | ICD-10-CM

## 2017-02-25 DIAGNOSIS — I742 Embolism and thrombosis of arteries of the upper extremities: Secondary | ICD-10-CM

## 2017-05-27 ENCOUNTER — Other Ambulatory Visit: Payer: Self-pay | Admitting: *Deleted

## 2017-05-27 MED ORDER — CLOPIDOGREL BISULFATE 75 MG PO TABS: 75.0000 mg | ORAL_TABLET | Freq: Every day | ORAL | 3 refills | Status: DC

## 2017-08-18 ENCOUNTER — Ambulatory Visit (INDEPENDENT_AMBULATORY_CARE_PROVIDER_SITE_OTHER)
Admission: RE | Admit: 2017-08-18 | Discharge: 2017-08-18 | Disposition: A | Discharge status: Home / Self Care | Payer: Medicare Other | Source: Ambulatory Visit | Attending: Surgery | Admitting: Surgery

## 2017-08-18 ENCOUNTER — Ambulatory Visit: Payer: Medicare Other | Admitting: Surgery

## 2017-08-18 ENCOUNTER — Ambulatory Visit (HOSPITAL_COMMUNITY)
Admission: RE | Admit: 2017-08-18 | Discharge: 2017-08-18 | Disposition: A | Discharge status: Home / Self Care | Payer: Medicare Other | Source: Ambulatory Visit | Attending: Surgery | Admitting: Surgery

## 2017-08-18 DIAGNOSIS — I742 Embolism and thrombosis of arteries of the upper extremities: Secondary | ICD-10-CM

## 2017-08-18 DIAGNOSIS — I771 Stricture of artery: Secondary | ICD-10-CM | POA: Insufficient documentation

## 2017-08-18 DIAGNOSIS — I6523 Occlusion and stenosis of bilateral carotid arteries: Secondary | ICD-10-CM | POA: Insufficient documentation

## 2017-08-25 ENCOUNTER — Ambulatory Visit (INDEPENDENT_AMBULATORY_CARE_PROVIDER_SITE_OTHER): Payer: Medicare Other | Admitting: Surgery

## 2017-08-25 ENCOUNTER — Encounter: Payer: Self-pay | Admitting: Surgery

## 2017-08-25 ENCOUNTER — Other Ambulatory Visit: Payer: Self-pay

## 2017-08-25 VITALS — BP 134/73 | HR 68 | Temp 96.7°F | Resp 14 | Ht 64.0 in | Wt 130.0 lb

## 2017-08-25 DIAGNOSIS — I779 Disorder of arteries and arterioles, unspecified: Secondary | ICD-10-CM

## 2017-08-25 DIAGNOSIS — I771 Stricture of artery: Secondary | ICD-10-CM

## 2017-08-25 NOTE — Progress Notes (Signed)
Vascular and Vein Specialist of [place]  Patient name: Kimberly [name] MRN: [id number] DOB: 1950/10/26 Sex: female   REASON FOR VISIT:    Follow up  [place]:   The patient is back today for follow-up. 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 She went back for lysis of adhesions secondary to abdominal pain in April. In June 2013 she underwent left carotid endarterectomy for a high-grade asymptomatic stenosis. She then later presented with dizziness and temporary vision loss. On 06/24/2012 she had stenting of her left subclavian artery. 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 On 08/13/2013 she underwent right carotid endarterectomy for an asymptomatic right carotid stenosis  She recently began having recurrent episodes of dizziness. She went for angiography because of a left subclavian in-stent stenosis on 04/27/2015. She was found to have a high-grade stenosis and this was stented. On 04/30/2015 she presented with acute ischemia to the left hand. She was taken to the operating room by Dr. fields who performed a thrombectomy. On 12/05/2015 she underwent drug coated balloon angioplasty of a left subclavian in-stent stenosis. 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  She still gets numbness on her left arm  The patient is a smoker. She is medically managed for hypertension which is been under good control. She is not on cholesterol medicine for hypercholesterolemia   PAST MEDICAL HISTORY:   Past Medical History:  Diagnosis Date  . Anemia   . Angina    March 2013  . Anxiety   . Asthma   . Blood transfusion   . Depression   . Fibromyalgia   . History of lead poisoning 1977  . Hyperlipidemia   . Hypertension   . Migraine   . Peripheral vascular disease (HCC)    claudication   right greater than left  femoral artery  . Shortness of breath   . Small bowel obstruction, partial ([place]) 04/08/11  . Stroke ([place])   . 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 Uterine cancer ([place])    Pre cervical     FAMILY HISTORY:   Family History  Problem Relation Age of Onset  . Cancer Mother        breast, colon, ovarian  . Cancer Brother 50       lung  . Hyperlipidemia Brother   . Cancer Other   . Stroke Other   . Cancer Maternal Grandmother        ovarian  . Anesthesia problems Neg Hx     SOCIAL HISTORY:   Social History   Tobacco Use  . Smoking status: Current Some Day Smoker    Packs/day: 0.10    Years: 45.00    Pack years: 4.50    Types: Cigarettes  . Smokeless tobacco: Never Used  . Tobacco comment: pt states that she smokes about 6 cigs per day  Substance Use Topics  . Alcohol use: No    Alcohol/week: 0.0 standard drinks     ALLERGIES:   Allergies  Allergen Reactions  . Codeine Nausea And Vomiting    Dizziness, headache, has passed out  . Hydrocodone Nausea And Vomiting and Other (See Comments)    Headache and dizziness  . Other Shortness Of Breath    Allergy to cologne/perfume base  . Oxycodone Nausea And Vomiting and Other (See Comments)    Dizziness   . Percocet [Oxycodone-Acetaminophen]  Nausea And Vomiting  . Vicodin [Hydrocodone-Acetaminophen] Nausea And Vomiting    Headache and dizziness     CURRENT MEDICATIONS:   Current Outpatient Medications  Medication Sig Dispense Refill  . albuterol (PROVENTIL HFA;VENTOLIN HFA) 108 (90 Base) MCG/ACT inhaler Inhale 1-2 puffs into the lungs every 6 (six) hours as needed for wheezing or shortness of breath.    Marland Kitchen aspirin 325 MG tablet Take 325 mg by mouth daily.     . clopidogrel (PLAVIX) 75 MG tablet Take 1 tablet (75 mg total) by mouth daily. 90 tablet 3  . ibuprofen (ADVIL,MOTRIN) 200 MG tablet Take 200 mg by mouth every 6 (six) hours as needed for moderate pain (pain).      No current  facility-administered medications for this visit.     REVIEW OF SYSTEMS:   [X]  denotes positive finding, [ ]  denotes negative finding Cardiac  Comments:  Chest pain or chest pressure:    Shortness of breath upon exertion:    Short of breath when lying flat:    Irregular heart rhythm:        Vascular    Pain in calf, thigh, or hip brought on by ambulation:    Pain in feet at night that wakes you up from your sleep:     Blood clot in your veins:    Leg swelling:         Pulmonary    Oxygen at home:    Productive cough:     Wheezing:         Neurologic    Sudden weakness in arms or legs:     Sudden numbness in arms or legs:     Sudden onset of difficulty speaking or slurred speech:    Temporary loss of vision in one eye:     Problems with dizziness:         Gastrointestinal    Blood in stool:     Vomited blood:         Genitourinary    Burning when urinating:     Blood in urine:        Psychiatric    Major depression:         Hematologic    Bleeding problems:    Problems with blood clotting too easily:        Skin    Rashes or ulcers:        Constitutional    Fever or chills:      PHYSICAL EXAM:   Vitals:   08/25/17 1326 08/25/17 1329 08/25/17 1330  BP: 127/67 (!) 146/73 134/73  Pulse: 68 68 68  Resp: 14    Temp: (!) 96.7 F (35.9 C)    TempSrc: Oral    SpO2: 99%    Weight: 130 lb (59 kg)    Height: 5\' 4"  (1.626 m)      GENERAL: The patient is a well-nourished female, in no acute distress. The vital signs are documented above. CARDIAC: There is a regular rate and rhythm.  VASCULAR: non-palpable left radial pulse, palpable left brachial PULMONARY: Non-labored respirations MUSCULOSKELETAL: There are no major deformities or cyanosis. NEUROLOGIC: No focal weakness or paresthesias are detected. SKIN: There are no ulcers or rashes noted. PSYCHIATRIC: The patient has a normal affect.  STUDIES:   I have ordered and reviewed her vascular lab studies  with the following findings:  Greater than 50% stenosis noted in the subclavian and left brachial artery velocities in the subclavian artery with 379.  In the brachial artery they were 525  Carotid duplex: Bilateral 40-59% stenosis  MEDICAL ISSUES:   Carotid stenosis: She will need follow-up ultrasound in 6 months  Left subclavian stenosis: The patient's velocities in the left subclavian and left brachial artery have increased since I last saw her.  We are now at the point where I think these need to be further evaluated with angiography.  This will need to be done through a femoral approach.  I will plan on evaluating her left subclavian artery as well as her left brachial artery and intervening as needed.  The patient will contact me to have this scheduled in September.    Annamarie Major, MD Vascular and Vein Specialists of [facility] [phone] Pager [phone]

## 2017-08-26 ENCOUNTER — Other Ambulatory Visit: Payer: Self-pay | Admitting: *Deleted

## 2017-08-26 NOTE — Progress Notes (Signed)
Phone call to patient instructed to be at [facility] hospital admitting department at 8:30 am for procedure. NPO past MN night prior. Must have a driver and caregiver for discharge. Continue Plavix and ASA (takes in pm). AM of use inhalers prn. Hold all other medication until after this procedure. Verbalized understanding.

## 2017-09-01 ENCOUNTER — Other Ambulatory Visit (HOSPITAL_COMMUNITY): Payer: Medicare Other

## 2017-09-01 ENCOUNTER — Ambulatory Visit: Payer: Medicare Other | Admitting: Surgery

## 2017-09-01 ENCOUNTER — Encounter (HOSPITAL_COMMUNITY): Payer: Medicare Other

## 2017-09-16 ENCOUNTER — Ambulatory Visit (HOSPITAL_COMMUNITY)
Admission: RE | Admit: 2017-09-16 | Discharge: 2017-09-16 | Disposition: A | Discharge status: Home / Self Care | Payer: Medicare Other | Source: Ambulatory Visit | Attending: Surgery | Admitting: Surgery

## 2017-09-16 ENCOUNTER — Telehealth: Payer: Self-pay | Admitting: Surgery

## 2017-09-16 ENCOUNTER — Encounter (HOSPITAL_COMMUNITY)
Admission: RE | Disposition: A | Discharge status: Home / Self Care | Payer: Self-pay | Source: Ambulatory Visit | Attending: Surgery

## 2017-09-16 ENCOUNTER — Other Ambulatory Visit: Payer: Self-pay

## 2017-09-16 DIAGNOSIS — Z7902 Long term (current) use of antithrombotics/antiplatelets: Secondary | ICD-10-CM | POA: Diagnosis not present

## 2017-09-16 DIAGNOSIS — F419 Anxiety disorder, unspecified: Secondary | ICD-10-CM | POA: Diagnosis not present

## 2017-09-16 DIAGNOSIS — Z7982 Long term (current) use of aspirin: Secondary | ICD-10-CM | POA: Insufficient documentation

## 2017-09-16 DIAGNOSIS — F17210 Nicotine dependence, cigarettes, uncomplicated: Secondary | ICD-10-CM | POA: Diagnosis not present

## 2017-09-16 DIAGNOSIS — I6529 Occlusion and stenosis of unspecified carotid artery: Secondary | ICD-10-CM | POA: Diagnosis not present

## 2017-09-16 DIAGNOSIS — J45909 Unspecified asthma, uncomplicated: Secondary | ICD-10-CM | POA: Diagnosis not present

## 2017-09-16 DIAGNOSIS — M797 Fibromyalgia: Secondary | ICD-10-CM | POA: Insufficient documentation

## 2017-09-16 DIAGNOSIS — T82856A Stenosis of peripheral vascular stent, initial encounter: Secondary | ICD-10-CM | POA: Insufficient documentation

## 2017-09-16 DIAGNOSIS — F329 Major depressive disorder, single episode, unspecified: Secondary | ICD-10-CM | POA: Diagnosis not present

## 2017-09-16 DIAGNOSIS — I771 Stricture of artery: Secondary | ICD-10-CM

## 2017-09-16 DIAGNOSIS — I70208 Unspecified atherosclerosis of native arteries of extremities, other extremity: Secondary | ICD-10-CM | POA: Diagnosis not present

## 2017-09-16 DIAGNOSIS — Z885 Allergy status to narcotic agent status: Secondary | ICD-10-CM | POA: Insufficient documentation

## 2017-09-16 DIAGNOSIS — E7800 Pure hypercholesterolemia, unspecified: Secondary | ICD-10-CM | POA: Diagnosis not present

## 2017-09-16 DIAGNOSIS — Z8673 Personal history of transient ischemic attack (TIA), and cerebral infarction without residual deficits: Secondary | ICD-10-CM | POA: Diagnosis not present

## 2017-09-16 DIAGNOSIS — Y831 Surgical operation with implant of artificial internal device as the cause of abnormal reaction of the patient, or of later complication, without mention of misadventure at the time of the procedure: Secondary | ICD-10-CM | POA: Insufficient documentation

## 2017-09-16 DIAGNOSIS — I10 Essential (primary) hypertension: Secondary | ICD-10-CM | POA: Insufficient documentation

## 2017-09-16 DIAGNOSIS — G43909 Migraine, unspecified, not intractable, without status migrainosus: Secondary | ICD-10-CM | POA: Insufficient documentation

## 2017-09-16 HISTORY — PX: AORTIC ARCH ANGIOGRAPHY: CATH118224

## 2017-09-16 HISTORY — PX: PERIPHERAL VASCULAR INTERVENTION: CATH118257

## 2017-09-16 HISTORY — PX: PERIPHERAL VASCULAR BALLOON ANGIOPLASTY: CATH118281

## 2017-09-16 HISTORY — PX: UPPER EXTREMITY ANGIOGRAPHY: CATH118270

## 2017-09-16 LAB — POCT ACTIVATED CLOTTING TIME
Activated Clotting Time: 274 seconds
Activated Clotting Time: 235 seconds
Activated Clotting Time: 180 seconds
Activated Clotting Time: 197 seconds

## 2017-09-16 LAB — POCT I-STAT, CHEM 8
BUN: 13 mg/dL (ref 8–23)
Calcium, Ion: 1.08 mmol/L — ABNORMAL LOW (ref 1.15–1.40)
Chloride: 104 mmol/L (ref 98–111)
Creatinine, Ser: 0.9 mg/dL (ref 0.44–1.00)
Glucose, Bld: 96 mg/dL (ref 70–99)
HCT: 45 % (ref 36.0–46.0)
Hemoglobin: 15.3 g/dL — ABNORMAL HIGH (ref 12.0–15.0)
Potassium: 3.9 mmol/L (ref 3.5–5.1)
Sodium: 139 mmol/L (ref 135–145)
TCO2: 28 mmol/L (ref 22–32)

## 2017-09-16 SURGERY — AORTIC ARCH ANGIOGRAPHY
Anesthesia: LOCAL

## 2017-09-16 MED ORDER — IODIXANOL 320 MG/ML IV SOLN
INTRAVENOUS | Status: DC | PRN
  Administered 2017-09-16: 120 mL via INTRA_ARTERIAL

## 2017-09-16 MED ORDER — MIDAZOLAM HCL 2 MG/2ML IJ SOLN
INTRAMUSCULAR | Status: DC | PRN
  Administered 2017-09-16: 1 mg via INTRAVENOUS
  Administered 2017-09-16: 2 mg via INTRAVENOUS

## 2017-09-16 MED ORDER — SODIUM CHLORIDE 0.9 % IV SOLN
INTRAVENOUS | Status: DC
  Administered 2017-09-16: 09:00:00 via INTRAVENOUS

## 2017-09-16 MED ORDER — LIDOCAINE HCL (PF) 1 % IJ SOLN
INTRAMUSCULAR | Status: AC
  Filled 2017-09-16: qty 30

## 2017-09-16 MED ORDER — LABETALOL HCL 5 MG/ML IV SOLN: 10.0000 mg | INTRAVENOUS | Status: DC | PRN

## 2017-09-16 MED ORDER — ONDANSETRON HCL 4 MG/2ML IJ SOLN: 4.0000 mg | Freq: Four times a day (QID) | INTRAMUSCULAR | Status: DC | PRN

## 2017-09-16 MED ORDER — HYDRALAZINE HCL 20 MG/ML IJ SOLN: 5.0000 mg | INTRAMUSCULAR | Status: DC | PRN

## 2017-09-16 MED ORDER — MORPHINE SULFATE (PF) 2 MG/ML IV SOLN
2.0000 mg | INTRAVENOUS | Status: DC | PRN
  Administered 2017-09-16 (×2): 2 mg via INTRAVENOUS
  Filled 2017-09-16: qty 1

## 2017-09-16 MED ORDER — ACETAMINOPHEN 325 MG PO TABS: 650.0000 mg | ORAL_TABLET | ORAL | Status: DC | PRN

## 2017-09-16 MED ORDER — HEPARIN (PORCINE) IN NACL 1000-0.9 UT/500ML-% IV SOLN
INTRAVENOUS | Status: AC
  Filled 2017-09-16: qty 1000

## 2017-09-16 MED ORDER — LIDOCAINE HCL (PF) 1 % IJ SOLN
INTRAMUSCULAR | Status: DC | PRN
  Administered 2017-09-16: 15 mL via INTRADERMAL

## 2017-09-16 MED ORDER — MIDAZOLAM HCL 2 MG/2ML IJ SOLN
INTRAMUSCULAR | Status: AC
  Filled 2017-09-16: qty 2

## 2017-09-16 MED ORDER — SODIUM CHLORIDE 0.9 % WEIGHT BASED INFUSION: 1.0000 mL/kg/h | INTRAVENOUS | Status: DC

## 2017-09-16 MED ORDER — FENTANYL CITRATE (PF) 100 MCG/2ML IJ SOLN
INTRAMUSCULAR | Status: DC | PRN
  Administered 2017-09-16: 50 ug via INTRAVENOUS
  Administered 2017-09-16: 25 ug via INTRAVENOUS

## 2017-09-16 MED ORDER — MORPHINE SULFATE (PF) 2 MG/ML IV SOLN
INTRAVENOUS | Status: AC
  Filled 2017-09-16: qty 1

## 2017-09-16 MED ORDER — HEPARIN SODIUM (PORCINE) 1000 UNIT/ML IJ SOLN
INTRAMUSCULAR | Status: DC | PRN
  Administered 2017-09-16: 7000 [IU] via INTRAVENOUS

## 2017-09-16 MED ORDER — HEPARIN SODIUM (PORCINE) 1000 UNIT/ML IJ SOLN
INTRAMUSCULAR | Status: AC
  Filled 2017-09-16: qty 1

## 2017-09-16 MED ORDER — HEPARIN (PORCINE) IN NACL 1000-0.9 UT/500ML-% IV SOLN
INTRAVENOUS | Status: DC | PRN
  Administered 2017-09-16 (×2): 500 mL

## 2017-09-16 MED ORDER — FENTANYL CITRATE (PF) 100 MCG/2ML IJ SOLN
INTRAMUSCULAR | Status: AC
  Filled 2017-09-16: qty 2

## 2017-09-16 SURGICAL SUPPLY — 19 items
BALLN COYOTE ES OTW 2.5X20X143 (BALLOONS) ×3
BALLOON CYTE ES OTW 2.5X20X143 (BALLOONS) IMPLANT
CATH ANGIO 5F BER2 100CM (CATHETERS) ×1 IMPLANT
CATH ANGIO 5F PIGTAIL 100CM (CATHETERS) ×1 IMPLANT
DEVICE CONTINUOUS FLUSH (MISCELLANEOUS) ×1 IMPLANT
KIT ENCORE 26 ADVANTAGE (KITS) ×1 IMPLANT
KIT MICROPUNCTURE NIT STIFF (SHEATH) ×1 IMPLANT
KIT PV (KITS) ×3 IMPLANT
SHEATH PINNACLE 5F 10CM (SHEATH) ×1 IMPLANT
SHEATH PINNACLE 7F 10CM (SHEATH) ×1 IMPLANT
SHEATH PROBE COVER 6X72 (BAG) ×1 IMPLANT
SHEATH SHUTTLE 7FR (SHEATH) ×1 IMPLANT
STENT VIABAHNBX 7X29X135 (Permanent Stent) ×1 IMPLANT
SYR MEDRAD MARK V 150ML (SYRINGE) ×3 IMPLANT
TRANSDUCER W/STOPCOCK (MISCELLANEOUS) ×3 IMPLANT
TRAY PV CATH (CUSTOM PROCEDURE TRAY) ×3 IMPLANT
WIRE BENTSON .035X145CM (WIRE) ×1 IMPLANT
WIRE ROSEN-J .035X260CM (WIRE) ×1 IMPLANT
WIRE SPARTACORE .014X300CM (WIRE) ×1 IMPLANT

## 2017-09-16 NOTE — Discharge Instructions (Signed)

## 2017-09-16 NOTE — Progress Notes (Addendum)
Site area: Right groin a 7 french arterial sheath was removed  Site Prior to Removal:  Level 1  Pressure Applied For 30 MINUTES    Bedrest  Beginning at 1640pm  Manual:   Yes.    Patient Status During Pull:  stable  Post Pull Groin Site:  Level 1, groin tender to touch/ bruise   Post Pull Instructions Given:  Yes.    Post Pull Pulses Present:  Yes.    Dressing Applied:  Yes.    Comments:  VS remain stable  Morphine 2 mg IV given for groin pain

## 2017-09-16 NOTE — Progress Notes (Signed)
Attempt to raise head of bed for client to drink soda and c/o 10/10 right groin pain and c/o 10/10 right groin pain with coughing

## 2017-09-16 NOTE — Progress Notes (Signed)
Patient developed a right groin hematoma after attempting to ambulate.  Manual pressure was held for an additional 15 minutes.  Her groin is now soft.  Is still somewhat tender.  She will continue with additional bedrest.  She will be reevaluated at 9:00 tonight.  If she ambulates without difficulty she will be stable for discharge.  If not, she will be admitted for observation  Kimberly [name]

## 2017-09-16 NOTE — Op Note (Addendum)
  Patient name: Ysabel C Obi MRN: [id number] DOB: 03/21/1950 Sex: female  09/16/2017 Pre-operative Diagnosis: in stent stenosis L SCA Post-operative diagnosis:  Same Surgeon:  Wells Brabham Procedure Performed:  1.  U/s guided access, right femoral artery  2.  Aortic Arch angiogram  3.  Left arm angiogram  4.  Stent, left subclavian artery  5.  Angioplasty, left brachial artery  6.  Conscious sedation (92 minutes)     Indications: The patient has undergone multiple interventions.  She now has in-stent stenosis in her left subclavian artery.  There is also a stenosis in her left brachial artery.  She is here for further evaluation and possible intervention.  Procedure:  The patient was identified in the holding area and taken to room 8.  The patient was then placed supine on the table and prepped and draped in the usual sterile fashion.  A time out was called.  Conscious sedation was administered with use of IV fentanyl Versed under continuous physician and nurse monitoring.  Heart rate, blood pressure, and oxygen saturations were continuously monitored.  Ultrasound was used to evaluate the right common femoral artery.  It was patent .  A digital ultrasound image was acquired.  A micropuncture needle was used to access the right common femoral artery under ultrasound guidance.  An 018 wire was advanced without resistance and a micropuncture sheath was placed.  The 018 wire was removed and a benson wire was placed.  The micropuncture sheath was exchanged for a 5 french sheath.  An omniflush catheter was advanced over the wire into the aortic arch and aortic arteriogram was performed.  Next using a Berenstein 2 catheter and a Bentson wire the left subclavian artery was selected and the catheter was placed in the left axillary artery and left arm angiogram was performed.  Findings:   Aortic arch: A type I aortic arch is identified.  The right innominate and left common carotid origins appear to  be widely patent.  There is a stent within the left subclavian artery which is patent however there is a 20 mm pressure gradient across the stent.  There is about a 50% stenosis of the subclavian artery beyond the vertebral artery.  Left upper extremity: The left axillary and brachial artery are patent with no stenosis but very small down to just above the antecubital crease.  Just proximal to the antecubital crease there is a 70% stenosis of the brachial artery which is about a 2.5 mm artery.  The radial and ulnar artery felt as does the palmar arch  Intervention: After the above images were acquired the decision was made to proceed with intervention.  Over a Rosen wire, a 7 French 90 cm sheath was advanced into the left subclavian stent.  Patient was fully heparinized.  I selected a 7 x 29 VBX stent.  I put this into position just beyond the existing stent took multiple pictures of multiple obliquities to identify the origin of the vertebral artery so that we did not cover this with the covered stent.  Once I was satisfied with the position of the stent, it was deployed, taking it to 15 atm.  Completion imaging showed that the subclavian artery is widely patent however there was no opacification of the vertebral artery.  It took several images but could not identify the origin of the vertebral artery.  I did not think that the stent had crossed the origin of the vertebral artery but rather a plaque shift caused   its occlusion.  Because she was a right vertebral artery dominant patient I did not see the need to try and pull the stents back and create further damage.  She appeared to be neurologically intact at this time.  At this point I went ahead with intervention of the left brachial artery.  I advanced a 014 [place] core easily across the brachial artery stenosis and selected 2.5 x 20 coyote balloon and performed balloon angioplasty at this level.  Follow-up imaging revealed no residual stenosis.  I then  withdrew the sheath back in the subclavian artery and took additional pictures.  Again the vertebral artery did not opacify and there appeared to be a increase in stenosis at the level of the vertebral artery within the subclavian artery, confirming that this was likely a significant plaque after stenting.  Catheters and wires were removed at this point and a long sheath was exchanged out for a short 7 French sheath.  The patient was neurologically intact at the end of the procedure  Impression:  #1  20 mm gradient across the left subclavian stent successfully treated using a 7 x 29 VBX stent.  There appeared to be a plaque shift after stent deployment which resulted in loss of the left vertebral artery and a increase in the stenosis in the subclavian artery distal to the vertebral artery origin.  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    V. Wells Brabham, M.D. [facility] of [place] Office: [phone] Pager:  [phone] 

## 2017-09-16 NOTE — Progress Notes (Signed)
Client up and walked and tolerated well; right groin site client states is tender; no bleeding right groin and right groin soft and bruised

## 2017-09-16 NOTE — Progress Notes (Signed)
Client c/o right groin pain; hematoma noted right groin about 5"x 3"; notified Dr Trula Slade; called cath lab and pressure held by Dorenda from cath lab x 20 min and right groin soft and tender

## 2017-09-16 NOTE — Progress Notes (Signed)
Reports loose tooth to lower front tooth.

## 2017-09-16 NOTE — H&P (Signed)
[facility] [place]  Patient name: Kimberly [name]        MRN: [id number]        DOB: 1951-01-07          Sex: female   REASON FOR VISIT:    Follow up  [place]:   The patient is back today for follow-up. 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 She went back for lysis of adhesions secondary to abdominal pain in April. In June 2013 she underwent left carotid endarterectomy for a high-grade asymptomatic stenosis. She then later presented with dizziness and temporary vision loss. On 06/24/2012 she had stenting of her left subclavian artery. 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 On 08/13/2013 she underwent right carotid endarterectomy for an asymptomatic right carotid stenosis  She recently began having recurrent episodes of dizziness. She went for angiography because of a left subclavian in-stent stenosis on 04/27/2015. She was found to have a high-grade stenosis and this was stented. On 04/30/2015 she presented with acute ischemia to the left hand. She was taken to the operating room by Dr. fields who performed a thrombectomy. On 12/05/2015 she underwent drug coated balloon angioplasty of a left subclavian in-stent stenosis. 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  She still gets numbness on her left arm  The patient is a smoker. She is medically managed for hypertension which is been under good control. She is not on cholesterol medicine for hypercholesterolemia   PAST MEDICAL HISTORY:       Past Medical History:  Diagnosis Date  . Anemia   . Angina    March 2013  . Anxiety   . Asthma   . Blood transfusion   . Depression   . Fibromyalgia   . History of lead poisoning 1977  . Hyperlipidemia   . Hypertension   . Migraine   . Peripheral vascular disease  (HCC)    claudication  right greater than left  femoral artery  . Shortness of breath   . Small bowel obstruction, partial ([place]) 04/08/11  . Stroke ([place])   . 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 Uterine cancer ([place])    Pre cervical     FAMILY HISTORY:        Family History  Problem Relation Age of Onset  . Cancer Mother        breast, colon, ovarian  . Cancer Brother 50       lung  . Hyperlipidemia Brother   . Cancer Other   . Stroke Other   . Cancer Maternal Grandmother        ovarian  . Anesthesia problems Neg Hx     SOCIAL HISTORY:   Social History        Tobacco Use  . Smoking status: Current Some Day Smoker    Packs/day: 0.10    Years: 45.00    Pack years: 4.50    Types: Cigarettes  . Smokeless tobacco: Never Used  . Tobacco comment: pt states that she smokes about 6 cigs per day  Substance Use Topics  . Alcohol use: No    Alcohol/week: 0.0 standard drinks     ALLERGIES:        Allergies  Allergen Reactions  . Codeine Nausea And Vomiting    Dizziness, headache, has passed out  . Hydrocodone Nausea And Vomiting  and Other (See Comments)    Headache and dizziness  . Other Shortness Of Breath    Allergy to cologne/perfume base  . Oxycodone Nausea And Vomiting and Other (See Comments)    Dizziness   . Percocet [Oxycodone-Acetaminophen] Nausea And Vomiting  . Vicodin [Hydrocodone-Acetaminophen] Nausea And Vomiting    Headache and dizziness     CURRENT MEDICATIONS:         Current Outpatient Medications  Medication Sig Dispense Refill  . albuterol (PROVENTIL HFA;VENTOLIN HFA) 108 (90 Base) MCG/ACT inhaler Inhale 1-2 puffs into the lungs every 6 (six) hours as needed for wheezing or shortness of breath.    Marland Kitchen aspirin 325 MG tablet Take 325 mg by mouth daily.     . clopidogrel (PLAVIX) 75 MG tablet Take 1 tablet (75 mg total) by mouth daily. 90 tablet 3  . ibuprofen (ADVIL,MOTRIN) 200 MG  tablet Take 200 mg by mouth every 6 (six) hours as needed for moderate pain (pain).      No current facility-administered medications for this visit.     REVIEW OF SYSTEMS:   [X]  denotes positive finding, [ ]  denotes negative finding Cardiac  Comments:  Chest pain or chest pressure:    Shortness of breath upon exertion:    Short of breath when lying flat:    Irregular heart rhythm:        Vascular    Pain in calf, thigh, or hip brought on by ambulation:    Pain in feet at night that wakes you up from your sleep:     Blood clot in your veins:    Leg swelling:         Pulmonary    Oxygen at home:    Productive cough:     Wheezing:         Neurologic    Sudden weakness in arms or legs:     Sudden numbness in arms or legs:     Sudden onset of difficulty speaking or slurred speech:    Temporary loss of vision in one eye:     Problems with dizziness:         Gastrointestinal    Blood in stool:     Vomited blood:         Genitourinary    Burning when urinating:     Blood in urine:        Psychiatric    Major depression:         Hematologic    Bleeding problems:    Problems with blood clotting too easily:        Skin    Rashes or ulcers:        Constitutional    Fever or chills:      PHYSICAL EXAM:        Vitals:   08/25/17 1326 08/25/17 1329 08/25/17 1330  BP: 127/67 (!) 146/73 134/73  Pulse: 68 68 68  Resp: 14    Temp: (!) 96.7 F (35.9 C)    TempSrc: Oral    SpO2: 99%    Weight: 130 lb (59 kg)    Height: 5\' 4"  (1.626 m)      GENERAL: The patient is a well-nourished female, in no acute distress. The vital signs are documented above. CARDIAC: There is a regular rate and rhythm.  VASCULAR: non-palpable left radial pulse, palpable left brachial PULMONARY: Non-labored respirations MUSCULOSKELETAL: There are no major deformities or  cyanosis. NEUROLOGIC: No focal weakness or paresthesias are detected.  SKIN: There are no ulcers or rashes noted. PSYCHIATRIC: The patient has a normal affect.  STUDIES:   I have ordered and reviewed her vascular lab studies with the following findings:  Greater than 50% stenosis noted in the subclavian and left brachial artery velocities in the subclavian artery with 379.  In the brachial artery they were 525  Carotid duplex: Bilateral 40-59% stenosis  MEDICAL ISSUES:   Carotid stenosis: She will need follow-up ultrasound in 6 months  Left subclavian stenosis: The patient's velocities in the left subclavian and left brachial artery have increased since I last saw her.  We are now at the point where I think these need to be further evaluated with angiography.  This will need to be done through a femoral approach.  I will plan on evaluating her left subclavian artery as well as her left brachial artery and intervening as needed.  The patient will contact me to have this scheduled in September.    Annamarie Major, MD [facility] [phone] Pager [phone]

## 2017-09-16 NOTE — Telephone Encounter (Signed)
sch appt spk to pt mld ltr 10/27/17 3pm carotid 4pm p/o MD

## 2017-09-17 ENCOUNTER — Encounter (HOSPITAL_COMMUNITY): Payer: Self-pay | Admitting: Surgery

## 2017-10-27 ENCOUNTER — Encounter: Payer: Medicare Other | Admitting: Surgery

## 2017-10-27 ENCOUNTER — Encounter (HOSPITAL_COMMUNITY): Payer: Medicare Other

## 2017-11-03 ENCOUNTER — Ambulatory Visit (HOSPITAL_COMMUNITY)
Admission: RE | Admit: 2017-11-03 | Discharge: 2017-11-03 | Disposition: A | Discharge status: Home / Self Care | Payer: Medicare Other | Source: Ambulatory Visit | Attending: Surgery | Admitting: Surgery

## 2017-11-03 ENCOUNTER — Encounter: Payer: Self-pay | Admitting: Surgery

## 2017-11-03 ENCOUNTER — Other Ambulatory Visit: Payer: Self-pay

## 2017-11-03 ENCOUNTER — Ambulatory Visit (INDEPENDENT_AMBULATORY_CARE_PROVIDER_SITE_OTHER): Payer: Medicare Other | Admitting: Surgery

## 2017-11-03 VITALS — BP 150/69 | HR 63 | Ht 64.0 in | Wt 131.0 lb

## 2017-11-03 DIAGNOSIS — I771 Stricture of artery: Secondary | ICD-10-CM | POA: Insufficient documentation

## 2017-11-03 DIAGNOSIS — I779 Disorder of arteries and arterioles, unspecified: Secondary | ICD-10-CM | POA: Diagnosis not present

## 2017-11-03 NOTE — Progress Notes (Signed)
[facility] and [facility]in Specialist of [place]  Patient name: Kimberly [name] MRN: [id number] DOB: July 12, 1950 Sex: female   REASON FOR VISIT:    Follow up  [place] ILLNESS:    Kimberly [name] is a 67 y.o. female who returns today for follow-up.  On 09/16/2017 she underwent stenting of her left subclavian artery and angioplasty of her left brachial artery.  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  She remained asymptomatic and was discharged home.  She no longer has numbness in her left arm following angioplasty.  She has not had any neurologic symptoms.  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 She went back for lysis of adhesions secondary to abdominal pain in April. In June 2013 she underwent left carotid endarterectomy for a high-grade asymptomatic stenosis. She then later presented with dizziness and temporary vision loss. On 06/24/2012 she had stenting of her left subclavian artery. 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 On 08/13/2013 she underwent right carotid endarterectomy for an asymptomatic right carotid stenosis  She recently began having recurrent episodes of dizziness. She went for angiography because of a left subclavian in-stent stenosis on 04/27/2015. She was found to have a high-grade stenosis and this was stented. On 04/30/2015 she presented with acute ischemia to the left hand. She was taken to the operating room by Dr. fields who performed a thrombectomy. On 12/05/2015 she underwent drug coated balloon angioplasty of a left subclavian in-stent stenosis. 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  The patient is a smoker. She is medically managed for hypertension which is been under good control.  She is not on cholesterol medicine for hypercholesterolemia    PAST MEDICAL HISTORY:   Past Medical History:  Diagnosis Date  . Anemia   . Angina    March 2013  . Anxiety   . Asthma   . Blood transfusion   . Depression   . Fibromyalgia   . History of lead poisoning 1977  . Hyperlipidemia   . Hypertension   . Migraine   . Peripheral vascular disease (HCC)    claudication  right greater than left  femoral artery  . Shortness of breath   . Small bowel obstruction, partial ([place]) 04/08/11  . Stroke ([place])   . 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 Uterine cancer ([place])    Pre cervical     FAMILY HISTORY:   Family History  Problem Relation Age of Onset  . Cancer Mother        breast, colon, ovarian  . Cancer Brother 50       lung  . Hyperlipidemia Brother   . Cancer Other   . Stroke Other   . Cancer Maternal Grandmother        ovarian  . Anesthesia problems Neg Hx     SOCIAL HISTORY:   Social History   Tobacco Use  . Smoking status: Current Some Day Smoker    Packs/day: 0.10    Years: 45.00    Pack years: 4.50    Types: Cigarettes  . Smokeless tobacco: Never Used  . Tobacco comment: pt states that she smokes about 6 cigs per day  Substance Use Topics  . Alcohol use: No    Alcohol/week: 0.0 standard drinks     ALLERGIES:  Allergies  Allergen Reactions  . Codeine Nausea And Vomiting    Dizziness, headache, has passed out  . Hydrocodone Nausea And Vomiting and Other (See Comments)    Headache and dizziness  . Other Shortness Of Breath    Allergy to cologne/perfume base  . Oxycodone Nausea And Vomiting and Other (See Comments)    Dizziness      CURRENT MEDICATIONS:   Current Outpatient Medications  Medication Sig Dispense Refill  . acetaminophen (TYLENOL) 500 MG tablet Take 1,000 mg by mouth daily as needed.    Marland Kitchen albuterol (PROVENTIL HFA;VENTOLIN HFA) 108 (90 Base) MCG/ACT inhaler Inhale 1-2 puffs into the lungs every 6 (six) hours as  needed for wheezing or shortness of breath.    Marland Kitchen aspirin 325 MG tablet Take 325 mg by mouth daily.     . clopidogrel (PLAVIX) 75 MG tablet Take 1 tablet (75 mg total) by mouth daily. (Patient taking differently: Take 75 mg by mouth every evening. ) 90 tablet 3  . diphenhydrAMINE-zinc acetate (BENADRYL) cream Apply 1 application topically daily as needed for itching.    Marland Kitchen ibuprofen (ADVIL,MOTRIN) 200 MG tablet Take 800 mg by mouth daily as needed for moderate pain.     No current facility-administered medications for this visit.     REVIEW OF SYSTEMS:   [X]  denotes positive finding, [ ]  denotes negative finding Cardiac  Comments:  Chest pain or chest pressure:    Shortness of breath upon exertion:    Short of breath when lying flat:    Irregular heart rhythm:        Vascular    Pain in calf, thigh, or hip brought on by ambulation:    Pain in feet at night that wakes you up from your sleep:     Blood clot in your veins:    Leg swelling:         Pulmonary    Oxygen at home:    Productive cough:     Wheezing:         Neurologic    Sudden weakness in arms or legs:     Sudden numbness in arms or legs:     Sudden onset of difficulty speaking or slurred speech:    Temporary loss of vision in one eye:     Problems with dizziness:         Gastrointestinal    Blood in stool:     Vomited blood:         Genitourinary    Burning when urinating:     Blood in urine:        Psychiatric    Major depression:         Hematologic    Bleeding problems:    Problems with blood clotting too easily:        Skin    Rashes or ulcers:        Constitutional    Fever or chills:      PHYSICAL EXAM:   Vitals:   11/03/17 1204 11/03/17 1207  BP: (!) 145/73 (!) 150/69  Pulse: 63   Weight: 131 lb (59.4 kg)     GENERAL: The patient is a well-nourished female, in no acute distress. The vital signs are documented above. CARDIAC: There is a regular rate and rhythm.  VASCULAR: I cannot  palpate a left radial pulse PULMONARY: Non-labored respirations ABDOMEN: Soft and non-tender with normal pitched bowel sounds.  MUSCULOSKELETAL: There are no major deformities or cyanosis. NEUROLOGIC:  No focal weakness or paresthesias are detected. SKIN: There are no ulcers or rashes noted. PSYCHIATRIC: The patient has a normal affect.  STUDIES:   I have ordered and reviewed her vascular lab studies with the following findings: Right Carotid: Velocities in the right ICA are consistent with a 40-59%        stenosis. Non-hemodynamically significant plaque <50% noted in        the CCA. The ECA appears <50% stenosed. Patent RIGHT carotid        endarterectomy with hyperplasia and evidence of restenosis at the        distal patch site.  Left Carotid: Velocities in the left ICA are consistent with a 40-59% stenosis.       Non-hemodynamically significant plaque noted in the CCA. The ECA       appears >50% stenosed. Patent LEFT carotid endarterectomy with       hyperplasia without evidence of restenosis. Unable to definitively       identifty LEFT subclavian artery stent.  Vertebrals: Right vertebral artery demonstrates antegrade flow. Left vertebral       artery demonstrates an occlusion. Subclavians: Bilateral subclavian arteries were stenotic. Bilateral subclavian       artery flow was disturbed. 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  This was not a well evaluated with ultrasound today.  She will come back in for repeat ultrasound in 6 months  Lower extremity: Repeat ABIs in 6 months.    Annamarie Major, MD [facility] Specialists of [facility] [phone] Pager [phone]

## 2018-02-26 ENCOUNTER — Encounter (HOSPITAL_COMMUNITY): Payer: Self-pay

## 2018-02-26 ENCOUNTER — Ambulatory Visit (INDEPENDENT_AMBULATORY_CARE_PROVIDER_SITE_OTHER): Payer: Medicare Other

## 2018-02-26 ENCOUNTER — Ambulatory Visit (HOSPITAL_COMMUNITY)
Admission: EM | Admit: 2018-02-26 | Discharge: 2018-02-26 | Disposition: A | Discharge status: Home / Self Care | Payer: Medicare Other | Attending: Family Medicine | Admitting: Family Medicine

## 2018-02-26 DIAGNOSIS — R05 Cough: Secondary | ICD-10-CM

## 2018-02-26 DIAGNOSIS — R0602 Shortness of breath: Secondary | ICD-10-CM

## 2018-02-26 DIAGNOSIS — R059 Cough, unspecified: Secondary | ICD-10-CM

## 2018-02-26 DIAGNOSIS — R062 Wheezing: Secondary | ICD-10-CM

## 2018-02-26 MED ORDER — ALBUTEROL SULFATE HFA 108 (90 BASE) MCG/ACT IN AERS: 1.0000 | INHALATION_SPRAY | Freq: Four times a day (QID) | RESPIRATORY_TRACT | 0 refills | Status: AC | PRN

## 2018-02-26 MED ORDER — PREDNISONE 10 MG (21) PO TBPK: ORAL_TABLET | Freq: Every day | ORAL | 0 refills | Status: DC

## 2018-02-26 MED ORDER — AZITHROMYCIN 250 MG PO TABS: 250.0000 mg | ORAL_TABLET | Freq: Every day | ORAL | 0 refills | Status: DC

## 2018-02-26 NOTE — ED Triage Notes (Signed)
Pt presents with productive cough with thick milky mucus, shortness of breath, flank & rib pain, chills, and generalized body aches.

## 2018-02-26 NOTE — ED Provider Notes (Signed)
[place]   [id number] 02/26/18 Arrival Time: 1401  ASSESSMENT & PLAN:  1. Cough   2. Wheezing   3. SOB (shortness of breath)    Given duration of symptoms and significant smoking history will treat with: Meds ordered this encounter  Medications  . azithromycin (ZITHROMAX) 250 MG tablet    Sig: Take 1 tablet (250 mg total) by mouth daily. Take first 2 tablets together, then 1 every day until finished.    Dispense:  6 tablet    Refill:  0  . predniSONE (STERAPRED UNI-PAK 21 TAB) 10 MG (21) TBPK tablet    Sig: Take by mouth daily. Take as directed.    Dispense:  21 tablet    Refill:  0  . albuterol (PROVENTIL HFA;VENTOLIN HFA) 108 (90 Base) MCG/ACT inhaler    Sig: Inhale 1-2 puffs into the lungs every 6 (six) hours as needed for wheezing or shortness of breath.    Dispense:  1 Inhaler    Refill:  0   I have personally viewed the imaging studies ordered this visit. No infiltrates or signs of PNA seen.  OTC symptom care as needed. Ensure adequate fluid intake and rest. May f/u with PCP or here as needed.  Reviewed expectations re: course of current medical issues. Questions answered. Outlined signs and symptoms indicating need for more acute intervention. Patient verbalized understanding. After Visit Summary given.   SUBJECTIVE: History from: patient.  Kimberly [name] is a 68 y.o. female who presents with complaint of nasal congestion, post-nasal drainage, and a persistent cough; without sore throat. Onset abrupt, approx one week ago; with fatigue and with body aches initially, less now. SOB: at times, esp with coughing. Wheezing: questions at times. No CP. Fever: questions subjective with chills, more at onset, less now. Overall normal PO intake without n/v. Known sick contacts: no. No specific or significant aggravating or alleviating factors reported. Coughing bothering her the most; affecting sleep. Significant tobacco abuse and multiple medical problems. OTC  treatment: DayQuil and NyQuil and Tessalon Perles without much help.   Social History   Tobacco Use  Smoking Status Current Some Day Smoker  . Packs/day: 0.10  . Years: 45.00  . Pack years: 4.50  . Types: Cigarettes  Smokeless Tobacco Never Used  Tobacco Comment   pt states that she smokes about 6 cigs per day    ROS: As per HPI. All other systems negative.   OBJECTIVE:  Vitals:   02/26/18 1423  BP: 128/65  Pulse: 72  Resp: 17  Temp: 98.6 F (37 C)  TempSrc: Oral  SpO2: 96%    General appearance: alert; appears fatigued HEENT: nasal congestion; clear runny nose; throat irritation secondary to post-nasal drainage Neck: supple without LAD CV: RRR Lungs: unlabored respirations, symmetrical air entry with mild expiratory wheezing; cough: moderate; able to speak full sentences Abd: soft Ext: no LE edema Skin: warm and dry Psychological: alert and cooperative; normal mood and affect  Imaging: Dg Chest 2 View  Result Date: 02/26/2018 CLINICAL DATA:  Shortness of breath and cough. EXAM: CHEST - 2 VIEW COMPARISON:  03/16/2015 FINDINGS: Again noted is a left subclavian artery stent. Both lungs are clear. Heart size is normal. Atherosclerotic calcifications at the aortic arch. Surgical clips in the right neck. No large pleural effusions. IMPRESSION: No active cardiopulmonary disease. Electronically Signed   By: Markus Daft M.D.   On: 02/26/2018 15:21    Allergies  Allergen Reactions  . Codeine Nausea And Vomiting  Dizziness, headache, has passed out  . Hydrocodone Nausea And Vomiting and Other (See Comments)    Headache and dizziness  . Other Shortness Of Breath    Allergy to cologne/perfume base  . Oxycodone Nausea And Vomiting and Other (See Comments)    Dizziness     Past Medical History:  Diagnosis Date  . Anemia   . Angina    March 2013  . Anxiety   . Asthma   . Blood transfusion   . Depression   . Fibromyalgia   . History of lead poisoning 1977  .  Hyperlipidemia   . Hypertension   . Migraine   . Peripheral vascular disease (HCC)    claudication  right greater than left  femoral artery  . Shortness of breath   . Small bowel obstruction, partial ([place]) 04/08/11  . Stroke ([place])   . 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 Uterine cancer (HCC)    Pre cervical   Family History  Problem Relation Age of Onset  . Cancer Mother        breast, colon, ovarian  . Cancer Brother 50       lung  . Hyperlipidemia Brother   . Cancer Other   . Stroke Other   . Cancer Maternal Grandmother        ovarian  . Anesthesia problems Neg Hx    Social History   Socioeconomic History  . Marital status: Legally Separated    Spouse name: Not on file  . Number of children: Not on file  . Years of education: Not on file  . Highest education level: Not on file  Occupational History  . Not on file  Social Needs  . Financial resource strain: Not on file  . Food insecurity:    Worry: Not on file    Inability: Not on file  . Transportation needs:    Medical: Not on file    Non-medical: Not on file  Tobacco Use  . Smoking status: Current Some Day Smoker    Packs/day: 0.10    Years: 45.00    Pack years: 4.50    Types: Cigarettes  . Smokeless tobacco: Never Used  . Tobacco comment: pt states that she smokes about 6 cigs per day  Substance and Sexual Activity  . Alcohol use: No    Alcohol/week: 0.0 standard drinks  . Drug use: No    Comment: History of Cocaine and marijuana abuse: "not in many years" (04/08/11  . Sexual activity: Not Currently  Lifestyle  . Physical activity:    Days per week: Not on file    Minutes per session: Not on file  . Stress: Not on file  Relationships  . Social connections:    Talks on phone: Not on file    Gets together: Not on file    Attends religious service: Not on file    Active member of club or organization: Not on file    Attends meetings of clubs or organizations: Not on file    Relationship  status: Not on file  . Intimate partner violence:    Fear of current or ex partner: Not on file    Emotionally abused: Not on file    Physically abused: Not on file    Forced sexual activity: Not on file  Other Topics Concern  . Not on file  Social History Narrative   Pt lives at home with her 2 grandsons. She has been separated for  32yrs, has two children, and has a 12th grade education level. She drinks 3 cups of coffee daily.           Vanessa Kick, MD 02/26/18 1539

## 2018-06-07 ENCOUNTER — Other Ambulatory Visit: Payer: Self-pay | Admitting: Surgery

## 2018-09-10 ENCOUNTER — Encounter (HOSPITAL_COMMUNITY): Payer: Self-pay | Admitting: Emergency Medicine

## 2018-09-10 ENCOUNTER — Ambulatory Visit (HOSPITAL_COMMUNITY)
Admission: EM | Admit: 2018-09-10 | Discharge: 2018-09-10 | Disposition: A | Discharge status: Home / Self Care | Payer: Medicare Other

## 2018-09-10 ENCOUNTER — Other Ambulatory Visit: Payer: Self-pay

## 2018-09-10 DIAGNOSIS — M79605 Pain in left leg: Secondary | ICD-10-CM | POA: Diagnosis not present

## 2018-09-10 NOTE — ED Triage Notes (Signed)
Le leg pain that started 1 - 1 1/2 weeks ago.  Patient points to anterior, mid-lower leg as area of pain.  Patient has visible veins in the area, but no injury noted.  Denies injuries.  Pedal pulse 2 + in left foot.    Reports elevated blood pressure yesterday 173/79  Patient concerned for "another blockage"  Patient called her provider, but has not heard back per patient

## 2018-09-10 NOTE — Progress Notes (Signed)
HISTORY AND PHYSICAL     CC:  follow up. Requesting Provider:  No ref. provider found  HPI: This is a 68 y.o. female who is status post aortobifemoral bypass graft on 02/26/2011 By Dr. Trula Slade. She went back for lysis of adhesions secondary to abdominal pain.   In June 2013 she underwent left carotid endarterectomy for a high-grade asymptomatic stenosis.  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 Trula Slade.   She had an in stent stenosis and in August 2015, she underwent drug coated balloon angioplasty of the left SCA by Dr. Trula Slade.  In April 2017, she was found to have elevated velocities and underwent stenting of the left SCA by Dr. Trula Slade.   On 04/30/2015 she presented with acute ischemia to the left hand. She was taken to the operating room by Dr. fields who performed a thrombectomy.   On 12/05/2015 she underwent drug coated balloon angioplasty of a left subclavian in-stent stenosis. 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  On August 13, 2013, she underwent right CEA for asymptomatic carotid stenosis.  She was discharged home the next day.   The pt returns today with complaints of pain in her left shin.  She describes this as sharp shooting pains that last a couple minutes at a time.  She states that it can happen when she is sitting, walking, standing, etc.  She does not have any wounds on her feet. She does not have claudication type sx.  She states that she has recently developed some surface veins in the left foot.  She has similar veins in the right foot.  She has not had an injury that she can recall.   She states that she is having dizziness a couple of times a week similar to when she had a stenosis of her SCA stent.  She denies any type of stroke sx.  She states back in January, she was bed bound with a virus for 18 days.  She is unsure if this was Corona virus but has been in touch with  health dept to try to determine if she has antibodies.  This is still in progress.   The pt is not on a statin for cholesterol management.    The pt is on an aspirin.    Other AC:  Plavix The pt is not on medication for hypertension.  The pt does not have diabetes. Tobacco hx:  Current.  She states she has cut down to about a 1/2 ppd, which is down from more than 2 ppd.    Past Medical History:  Diagnosis Date  . Anemia   . Angina    March 2013  . Anxiety   . Asthma   . Blood transfusion   . Depression   . Fibromyalgia   . History of lead poisoning 1977  . Hyperlipidemia   . Hypertension   . Migraine   . Peripheral vascular disease (HCC)    claudication  right greater than left  femoral artery  . Shortness of breath   . Small bowel obstruction, partial ([place]) 04/08/11  . Stroke ([place])   . 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 Uterine cancer [facility])    Pre cervical    Past Surgical History:  Procedure Laterality Date  . abdominal angiogram    . ABDOMINAL ANGIOGRAM N/A 02/05/2011   Procedure: ABDOMINAL ANGIOGRAM;  Surgeon:  Serafina Mitchell, MD;  Location: [facility] CATH LAB;  Service: Cardiovascular;  Laterality: N/A;  . ABDOMINAL HYSTERECTOMY  2008  . AORTA - BILATERAL FEMORAL ARTERY BYPASS GRAFT  02/26/2011   Procedure: AORTA BIFEMORAL BYPASS GRAFT;  Surgeon: Theotis Burrow, MD;  Location: [place];  Service: Vascular;  Laterality: N/A;  . AORTIC ARCH ANGIOGRAPHY N/A 09/16/2017   Procedure: AORTIC ARCH ANGIOGRAPHY;  Surgeon: Serafina Mitchell, MD;  Location: [place] CV LAB;  Service: Cardiovascular;  Laterality: N/A;  . ARCH AORTOGRAM N/A 06/24/2012   Procedure: ARCH AORTOGRAM;  Surgeon: Serafina Mitchell, MD;  Location: [facility] CATH LAB;  Service: Cardiovascular;  Laterality: N/A;  . BILATERAL UPPER EXTREMITY ANGIOGRAM Left 08/12/2013   Procedure: UPPER EXTREMITY ANGIOGRAM;  Surgeon: Serafina Mitchell, MD;  Location: [facility] CATH LAB;  Service: Cardiovascular;  Laterality: Left;  . CAROTID  ENDARTERECTOMY Left 06/13/2011   Left  CEA  . CAROTID ENDARTERECTOMY Right 08-13-13   CEA  . CHOLECYSTECTOMY N/A 12/08/2012   Procedure: LAPAROSCOPIC CHOLECYSTECTOMY WITH INTRAOPERATIVE CHOLANGIOGRAM;  Surgeon: Haywood Lasso, MD;  Location: [place];  Service: General;  Laterality: N/A;  . COLONOSCOPY WITH PROPOFOL N/A 06/16/2014   Procedure: COLONOSCOPY WITH PROPOFOL;  Surgeon: Garlan Fair, MD;  Location: [facility] END[facility]COPY;  Service: Endoscopy;  Laterality: N/A;  . EMBOLECTOMY Left 04/30/2015   Procedure: EMBOLECTOMY BRACHIAL, Radial and ulnar arteries with patch angioplasty of brachial artery.;  Surgeon: Elam Dutch, MD;  Location: [facility] OR;  Service: Vascular;  Laterality: Left;  . ENDARTERECTOMY  06/13/2011   Procedure: ENDARTERECTOMY CAROTID;  Surgeon: Serafina Mitchell, MD;  Location: [facility] OR;  Service: Vascular;  Laterality: Left;  Left carotid artery endarterectomy with vascu-guard patch angioplasty  . ENDARTERECTOMY Right 08/13/2013   Procedure: RIGHT  CAROTID ENDARTERECTOMY CAROTID WITH BOVINE PERICARDIAL PATCH ANGIOPLASTY;  Surgeon: Serafina Mitchell, MD;  Location: [place] OR;  Service: Vascular;  Laterality: Right;  . ESOPHAGOGASTRODUODENOSCOPY (EGD) WITH PROPOFOL N/A 06/16/2014   Procedure: ESOPHAGOGASTRODUODENOSCOPY (EGD) WITH PROPOFOL;  Surgeon: Garlan Fair, MD;  Location: [facility];  Service: Endoscopy;  Laterality: N/A;  . LAPAROTOMY  04/10/2011   Procedure: EXPLORATORY LAPAROTOMY;  Surgeon: Shann Medal, MD;  Location: [place];  Service: General;  Laterality: N/A;  ENTEROLYSIS OF ADHESIONS  . LOWER EXTREMITY ANGIOGRAM Left 06/24/12   Left stent intervention, left subclavian  . PERIPHERAL VASCULAR BALLOON ANGIOPLASTY Left 09/16/2017   Procedure: PERIPHERAL VASCULAR BALLOON ANGIOPLASTY;  Surgeon: Serafina Mitchell, MD;  Location: [place] CV LAB;  Service: Cardiovascular;  Laterality: Left;  brachial artery  . PERIPHERAL VASCULAR CATHETERIZATION N/A 04/26/2015   Procedure: Aortic Arch  Angiography;  Surgeon: Serafina Mitchell, MD;  Location: [place] CV LAB;  Service: Cardiovascular;  Laterality: N/A;  . PERIPHERAL VASCULAR CATHETERIZATION  04/26/2015   Procedure: Peripheral Vascular Intervention;  Surgeon: Serafina Mitchell, MD;  Location: [place] CV LAB;  Service: Cardiovascular;;  lt illiac  . PERIPHERAL VASCULAR CATHETERIZATION N/A 12/05/2015   Procedure: Aortic Arch Angiography;  Surgeon: Serafina Mitchell, MD;  Location: [place] CV LAB;  Service: Cardiovascular;  Laterality: N/A;  . PERIPHERAL VASCULAR CATHETERIZATION Left 12/05/2015   Procedure: Peripheral Vascular Intervention;  Surgeon: Serafina Mitchell, MD;  Location: [place] CV LAB;  Service: Cardiovascular;  Laterality: Left;  subclavian  . PERIPHERAL VASCULAR INTERVENTION Left 09/16/2017   Procedure: PERIPHERAL VASCULAR INTERVENTION;  Surgeon: Serafina Mitchell, MD;  Location: [place] CV LAB;  Service: Cardiovascular;  Laterality: Left;  subclavian  artery  . PR VEIN BYPASS GRAFT,AORTO-FEM-POP  02/26/11  . UPPER EXTREMITY ANGIOGRAPHY Left 09/16/2017   Procedure: Upper Extremity Angiography;  Surgeon: Serafina Mitchell, MD;  Location: [place] CV LAB;  Service: Cardiovascular;  Laterality: Left;    Allergies  Allergen Reactions  . Codeine Nausea And Vomiting    Dizziness, headache, has passed out  . Hydrocodone Nausea And Vomiting and Other (See Comments)    Headache and dizziness  . Other Shortness Of Breath    Allergy to cologne/perfume base  . Oxycodone Nausea And Vomiting and Other (See Comments)    Dizziness     Current Outpatient Medications  Medication Sig Dispense Refill  . acetaminophen (TYLENOL) 500 MG tablet Take 1,000 mg by mouth daily as needed.    Marland Kitchen albuterol (PROVENTIL HFA;VENTOLIN HFA) 108 (90 Base) MCG/ACT inhaler Inhale 1-2 puffs into the lungs every 6 (six) hours as needed for wheezing or shortness of breath. 1 Inhaler 0  . aspirin 325 MG tablet Take 325 mg by mouth daily.     .  clopidogrel (PLAVIX) 75 MG tablet Take 1 tablet (75 mg total) by mouth every evening. 90 tablet 2  . diphenhydrAMINE-zinc acetate (BENADRYL) cream Apply 1 application topically daily as needed for itching.    Marland Kitchen ibuprofen (ADVIL,MOTRIN) 200 MG tablet Take 800 mg by mouth daily as needed for moderate pain.     No current facility-administered medications for this visit.     Family History  Problem Relation Age of Onset  . Cancer Mother        breast, colon, ovarian  . Cancer Brother 50       lung  . Hyperlipidemia Brother   . Cancer Other   . Stroke Other   . Cancer Maternal Grandmother        ovarian  . Anesthesia problems Neg Hx     Social History   Socioeconomic History  . Marital status: Legally Separated    Spouse name: Not on file  . Number of children: Not on file  . Years of education: Not on file  . Highest education level: Not on file  Occupational History  . Not on file  Social Needs  . Financial resource strain: Not on file  . Food insecurity    Worry: Not on file    Inability: Not on file  . Transportation needs    Medical: Not on file    Non-medical: Not on file  Tobacco Use  . Smoking status: Current Some Day Smoker    Packs/day: 0.10    Years: 45.00    Pack years: 4.50    Types: Cigarettes  . Smokeless tobacco: Never Used  . Tobacco comment: pt states that she smokes about 6 cigs per day  Substance and Sexual Activity  . Alcohol use: No    Alcohol/week: 0.0 standard drinks  . Drug use: No    Comment: History of Cocaine and marijuana abuse: "not in many years" (04/08/11  . Sexual activity: Not Currently  Lifestyle  . Physical activity    Days per week: Not on file    Minutes per session: Not on file  . Stress: Not on file  Relationships  . Social Herbalist on phone: Not on file    Gets together: Not on file    Attends religious service: Not on file    Active member of club or organization: Not on file    Attends meetings of clubs  or  organizations: Not on file    Relationship status: Not on file  . Intimate partner violence    Fear of current or ex partner: Not on file    Emotionally abused: Not on file    Physically abused: Not on file    Forced sexual activity: Not on file  Other Topics Concern  . Not on file  Social History Narrative   Pt lives at home with her 2 grandsons. She has been separated for 18yrs, has two children, and has a 12th grade education level. She drinks 3 cups of coffee daily.     REVIEW OF SYSTEMS:   [X]  denotes positive finding, [ ]  denotes negative finding Cardiac  Comments:  Chest pain or chest pressure:    Shortness of breath upon exertion: x   Short of breath when lying flat:    Irregular heart rhythm:        Vascular    Pain in calf, thigh, or hip brought on by ambulation: x See HPI-left leg  Pain in feet at night that wakes you up from your sleep:  x Left leg  Blood clot in your veins:    Leg swelling:         Pulmonary    Oxygen at home:    Productive cough:  x   Wheezing:         Neurologic    Sudden weakness in arms or legs:     Sudden numbness in arms or legs:     Sudden onset of difficulty speaking or slurred speech:    Temporary loss of vision in one eye:     Problems with dizziness:         Gastrointestinal    Blood in stool:     Vomited blood:         Genitourinary    Burning when urinating:     Blood in urine:        Psychiatric    Major depression:         Hematologic    Bleeding problems:    Problems with blood clotting too easily:        Skin    Rashes or ulcers:        Constitutional    Fever or chills:      PHYSICAL EXAMINATION:  Today's Vitals   09/11/18 1050 09/11/18 1057  BP: (!) 118/58 (!) 146/60  Pulse: 63 63  Resp: 14   Temp: (!) 97.4 F (36.3 C)   TempSrc: Temporal   SpO2: 100%   Weight: 123 lb (55.8 kg)   Height: 5\' 4"  (1.626 m)   PainSc: 9     Body mass index is 21.11 kg/m.   General:  WDWN in NAD; vital signs  documented above Gait: Not observed HENT: WNL, normocephalic Pulmonary: normal non-labored breathing , without Rales, rhonchi,  wheezing Cardiac: regular HR, without  Murmurs; with carotid bruits bilaterally Abdomen: soft, NT, no masses Skin: without rashes Vascular Exam/Pulses:  Right Left  Radial 2+ (normal) 1-2+ slightly decreased from right  Ulnar 2+ (normal) 1+ (weak)  Femoral 2+ (normal) 2+ (normal)  Popliteal Unable to palpate  Unable to palpate   DP 2+ normal monophasic doppler signal  PT Biphasic doppler signal Biphasic doppler signal   Extremities: without ischemic changes, without Gangrene , without cellulitis; without open wounds;  Musculoskeletal: no muscle wasting or atrophy  Neurologic: A&O X 3;  No focal weakness or paresthesias are detected Psychiatric:  The pt  has Normal affect.   Non-Invasive Vascular Imaging:   ABI's/TBI's on 09/11/2018: Right:  1.02 Left:  0.73  Previous ABI's/TBI's on 11/07/15: Right:  1.01 Left:  0.93  Carotid duplex 11/03/2017: Summary: Right Carotid: Velocities in the right ICA are consistent with a 40-59%                stenosis. Non-hemodynamically significant plaque <50% noted in the CCA. The ECA appears <50% stenosed. Patent RIGHT carotid endarterectomy with hyperplasia and evidence of restenosis at the distal patch site.  Left Carotid: Velocities in the left ICA are consistent with a 40-59% stenosis. Non-hemodynamically significant plaque noted in the CCA. The ECA appears >50% stenosed. Patent LEFT carotid endarterectomy with hyperplasia without evidence of restenosis. Unable to definitively identifty LEFT subclavian artery stent.  Vertebrals:  Right vertebral artery demonstrates antegrade flow. Left vertebral artery demonstrates an occlusion. Subclavians: Bilateral subclavian arteries were stenotic. Bilateral subclavian artery flow was disturbed  ASSESSMENT/PLAN:: 68 y.o. female here for follow up for one week shooting pains  in left shin.    -pt's ABI on the left is decreased from previous exam, however, her sx do not appear to be related to a blood flow issue.  She does not describe claudication type sx and she does not have any non healing wounds.  -pt will need to return for carotid duplex and LUE arterial duplex given these were supposeed to be done in April 2020.   She does have bilateral carotid bruits and she is symptomatic and having dizziness that is similar to when she developed a stenosis in the past of the left SCA stent.  -she will f/u with these studies and see Dr. Trula Slade at his next available appt.   -discussed importance of smoking cessation with pt.  Praised her as she has decreased her smoking to 1/2 ppd from more than 2ppd.  -she will call sooner should she develop any issues before her visit with Dr. Trula Slade.    Leontine Locket, PA-C [facility] [phone]  Clinic MD:   Donzetta Matters

## 2018-09-10 NOTE — Discharge Instructions (Addendum)
You have an appointment at 11:15 tomorrow with at the vascular office. Be there at 11:00 and make sure to wear your mask.

## 2018-09-10 NOTE — ED Provider Notes (Signed)
[place]    CSN: [id number] Arrival date & time: 09/10/18  0841      History   Chief Complaint Chief Complaint  Patient presents with  . Leg Pain    HPI Kimberly [name] is a 68 y.o. female.   Patient is a 68 year old female with past medical history of anemia, angina, anxiety, asthma, depression, fibromyalgia, hyperlipidemia, hypertension, peripheral vascular disease, small bowel obstruction, stroke, subclavian artery disease, uterine cancer.  She presents today with approximately 1 week of left lower extremity discomfort across the shin bone.  This is been intermittent.  Reporting mildly enlarged veins in that area.  No color change, temperature change.  Nothing makes the pain worse or better.  Reporting also that she has had some elevated blood pressures at the highest 173/79 yesterday.  She took her Plavix and aspirin last night.  She is concerned for another blockage due to her past medical history.  Denies any increased walking, standing, running, injuries or falls. No fever.   ROS per HPI      Past Medical History:  Diagnosis Date  . Anemia   . Angina    March 2013  . Anxiety   . Asthma   . Blood transfusion   . Depression   . Fibromyalgia   . History of lead poisoning 1977  . Hyperlipidemia   . Hypertension   . Migraine   . Peripheral vascular disease (HCC)    claudication  right greater than left  femoral artery  . Shortness of breath   . Small bowel obstruction, partial ([place]) 04/08/11  . Stroke ([place])   . Subclavian artery disease ([place])    left  . Uterine cancer [facility])    Pre cervical    Patient Active Problem List   Diagnosis Date Noted  . 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 Neck pain 08/28/2013  . Wound infection after surgery 08/28/2013  . Drainage from wound-Right neck 08/19/2013  . Visit for wound check 08/19/2013  . Swelling of limb-Right neck 08/19/2013  . Carotid stenosis 08/12/2013  . Subclavian steal syndrome  08/12/2013  . Numbness-Bilateral arms 01/11/2013  . Cholecystitis, acute 12/05/2012  . Weakness 06/29/2012  . Pain in limb-Left neck,shoulder and arm 06/29/2012  . Aftercare following surgery of the circulatory system, [place] 12/09/2011  . Subclavian arterial stenosis ([place]) 05/27/2011  . Small bowel obstruction due to adhesions.  Enterolysis 04/10/2011. 05/02/2011  . Pain in the abdomen 04/08/2011  . Ileus ([place]) 04/08/2011  . PVD (peripheral vascular disease) ([place]) 04/08/2011  . 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 Occlusion and stenosis of carotid artery without mention of cerebral infarction 03/25/2011  . Atherosclerosis of native arteries of the extremities with intermittent claudication 03/14/2011  . PVD (peripheral vascular disease) with claudication ([place]) 02/06/2011  . Subclavian artery stenosis ([place]) 01/28/2011  . PAP SMEAR, LGSIL, ABNORMAL 03/30/2010  . HYPERCHOLESTEROLEMIA 03/28/2010  . INTERMITTENT CLAUDICATION 03/27/2010  . CYSTITIS, ACUTE 03/27/2010  . LYMPHADENOPATHY 02/06/2010  . TOBACCO ABUSE 12/07/2009  . DENTAL PAIN 12/07/2009  . MENOPAUSE, SURGICAL 01/07/2005    Past Surgical History:  Procedure Laterality Date  . abdominal angiogram    . ABDOMINAL ANGIOGRAM N/A 02/05/2011   Procedure: ABDOMINAL ANGIOGRAM;  Surgeon: Serafina Mitchell, MD;  Location[facility] CATH LAB;  Service: Cardiovascular;  Laterality: N/A;  . ABDOMINAL HYSTERECTOMY  2008  . AORTA - BILATERAL FEMORAL ARTERY BYPASS GRAFT  02/26/2011   Procedure: AORTA BIFEMORAL BYPASS GRAFT;  Surgeon: Theotis Burrow, MD;  Location: [facility] OR;  Service: Vascular;  Laterality: N/A;  . AORTIC ARCH ANGIOGRAPHY N/A 09/16/2017   Procedure: AORTIC ARCH ANGIOGRAPHY;  Surgeon: Serafina Mitchell, MD;  Location: [place] CV LAB;  Service: Cardiovascular;  Laterality: N/A;  . ARCH AORTOGRAM N/A 06/24/2012   Procedure: ARCH AORTOGRAM;  Surgeon: Serafina Mitchell, MD;  Location: [facility] CATH LAB;  Service: Cardiovascular;  Laterality:  N/A;  . BILATERAL UPPER EXTREMITY ANGIOGRAM Left 08/12/2013   Procedure: UPPER EXTREMITY ANGIOGRAM;  Surgeon: Serafina Mitchell, MD;  Location: [facility] CATH LAB;  Service: Cardiovascular;  Laterality: Left;  . CAROTID ENDARTERECTOMY Left 06/13/2011   Left  CEA  . CAROTID ENDARTERECTOMY Right 08-13-13   CEA  . CHOLECYSTECTOMY N/A 12/08/2012   Procedure: LAPAROSCOPIC CHOLECYSTECTOMY WITH INTRAOPERATIVE CHOLANGIOGRAM;  Surgeon: Haywood Lasso, MD;  Location: [place];  Service: General;  Laterality: N/A;  . COLONOSCOPY WITH PROPOFOL N/A 06/16/2014   Procedure: COLONOSCOPY WITH PROPOFOL;  Surgeon: Garlan Fair, MD;  Location: [facility] ENDOSCOPY;  Service: Endoscopy;  Laterality: N/A;  . EMBOLECTOMY Left 04/30/2015   Procedure: EMBOLECTOMY BRACHIAL, Radial and ulnar arteries with patch angioplasty of brachial artery.;  Surgeon: Elam Dutch, MD;  Location[facility] OR;  Service: Vascular;  Laterality: Left;  . ENDARTERECTOMY  06/13/2011   Procedure: ENDARTERECTOMY CAROTID;  Surgeon: Serafina Mitchell, MD;  Location: [facility] OR;  Service: Vascular;  Laterality: Left;  Left carotid artery endarterectomy with vascu-guard patch angioplasty  . ENDARTERECTOMY Right 08/13/2013   Procedure: RIGHT  CAROTID ENDARTERECTOMY CAROTID WITH BOVINE PERICARDIAL PATCH ANGIOPLASTY;  Surgeon: Serafina Mitchell, MD;  Location: [place] OR;  Service: Vascular;  Laterality: Right;  . ESOPHAGOGASTRODUODENOSCOPY (EGD) WITH PROPOFOL N/A 06/16/2014   Procedure: ESOPHAGOGASTRODUODENOSCOPY (EGD) WITH PROPOFOL;  Surgeon: Garlan Fair, MD;  Location: [facility] ENDOSCOPY;  Service: Endoscopy;  Laterality: N/A;  . LAPAROTOMY  04/10/2011   Procedure: EXPLORATORY LAPAROTOMY;  Surgeon: Shann Medal, MD;  Location: [place];  Service: General;  Laterality: N/A;  ENTEROLYSIS OF ADHESIONS  . LOWER EXTREMITY ANGIOGRAM Left 06/24/12   Left stent intervention, left subclavian  . PERIPHERAL VASCULAR BALLOON ANGIOPLASTY Left 09/16/2017   Procedure: PERIPHERAL VASCULAR BALLOON ANGIOPLASTY;   Surgeon: Serafina Mitchell, MD;  Location: [place] CV LAB;  Service: Cardiovascular;  Laterality: Left;  brachial artery  . PERIPHERAL VASCULAR CATHETERIZATION N/A 04/26/2015   Procedure: Aortic Arch Angiography;  Surgeon: Serafina Mitchell, MD;  Location: [place] CV LAB;  Service: Cardiovascular;  Laterality: N/A;  . PERIPHERAL VASCULAR CATHETERIZATION  04/26/2015   Procedure: Peripheral Vascular Intervention;  Surgeon: Serafina Mitchell, MD;  Location: [place] CV LAB;  Service: Cardiovascular;;  lt illiac  . PERIPHERAL VASCULAR CATHETERIZATION N/A 12/05/2015   Procedure: Aortic Arch Angiography;  Surgeon: Serafina Mitchell, MD;  Location: [place] CV LAB;  Service: Cardiovascular;  Laterality: N/A;  . PERIPHERAL VASCULAR CATHETERIZATION Left 12/05/2015   Procedure: Peripheral Vascular Intervention;  Surgeon: Serafina Mitchell, MD;  Location: [place] CV LAB;  Service: Cardiovascular;  Laterality: Left;  subclavian  . PERIPHERAL VASCULAR INTERVENTION Left 09/16/2017   Procedure: PERIPHERAL VASCULAR INTERVENTION;  Surgeon: Serafina Mitchell, MD;  Location: [place] CV LAB;  Service: Cardiovascular;  Laterality: Left;  subclavian artery  . PR VEIN BYPASS GRAFT,AORTO-FEM-POP  02/26/11  . UPPER EXTREMITY ANGIOGRAPHY Left 09/16/2017   Procedure: Upper Extremity Angiography;  Surgeon: Serafina Mitchell, MD;  Location: [place] CV LAB;  Service: Cardiovascular;  Laterality: Left;    OB History  No obstetric history on file.      Home Medications    Prior to Admission medications   Medication Sig Start Date End Date Taking? Authorizing Provider  clopidogrel (PLAVIX) 75 MG tablet Take 1 tablet (75 mg total) by mouth every evening. 06/08/18  Yes Serafina Mitchell, MD  acetaminophen (TYLENOL) 500 MG tablet Take 1,000 mg by mouth daily as needed.    [provider]  albuterol (PROVENTIL HFA;VENTOLIN HFA) 108 (90 Base) MCG/ACT inhaler Inhale 1-2 puffs into the lungs every 6 (six) hours  as needed for wheezing or shortness of breath. 02/26/18   Vanessa Kick, MD  aspirin 325 MG tablet Take 325 mg by mouth daily.     [provider]  diphenhydrAMINE-zinc acetate (BENADRYL) cream Apply 1 application topically daily as needed for itching.    [provider]  ibuprofen (ADVIL,MOTRIN) 200 MG tablet Take 800 mg by mouth daily as needed for moderate pain.    [provider]    Family History Family History  Problem Relation Age of Onset  . Cancer Mother        breast, colon, ovarian  . Cancer Brother 50       lung  . Hyperlipidemia Brother   . Cancer Other   . Stroke Other   . Cancer Maternal Grandmother        ovarian  . Anesthesia problems Neg Hx     Social History Social History   Tobacco Use  . Smoking status: Current Some Day Smoker    Packs/day: 0.10    Years: 45.00    Pack years: 4.50    Types: Cigarettes  . Smokeless tobacco: Never Used  . Tobacco comment: pt states that she smokes about 6 cigs per day  Substance Use Topics  . Alcohol use: No    Alcohol/week: 0.0 standard drinks  . Drug use: No    Comment: History of Cocaine and marijuana abuse: "not in many years" (04/08/11     Allergies   Codeine, Hydrocodone, Other, and Oxycodone   Review of Systems Review of Systems   Physical Exam Triage Vital Signs ED Triage Vitals  Enc Vitals Group     BP 09/10/18 0912 (!) 148/72     Pulse Rate 09/10/18 0912 65     Resp 09/10/18 0912 18     Temp --      Temp src --      SpO2 09/10/18 0912 95 %     Weight --      Height --      Head Circumference --      Peak Flow --      Pain Score 09/10/18 0906 10     Pain Loc --      Pain Edu? --      Excl. in [place]? --    No data found.  Updated Vital Signs BP (!) 148/72 (BP Location: Right Arm)   Pulse 65   Resp 18   SpO2 95%   Visual Acuity Right Eye Distance:   Left Eye Distance:   Bilateral Distance:    Right Eye Near:   Left Eye Near:    Bilateral Near:      Physical Exam Vitals signs and nursing note reviewed.  Constitutional:      General: She is not in acute distress.    Appearance: Normal appearance. She is not ill-appearing, toxic-appearing or diaphoretic.  HENT:     Head: Normocephalic and atraumatic.  Nose: Nose normal.  Eyes:     Conjunctiva/sclera: Conjunctivae normal.  Neck:     Musculoskeletal: Normal range of motion.  Cardiovascular:     Rate and Rhythm: Normal rate and regular rhythm.  Pulmonary:     Effort: Pulmonary effort is normal.     Breath sounds: Normal breath sounds.  Musculoskeletal: Normal range of motion.        General: No swelling.     Comments: Nontender to palpation of the entire left lower extremity.  No calf swelling. Lower extremity color and temperature normal. Pedal pulse 2+ Good range of motion  Skin:    General: Skin is warm and dry.     Coloration: Skin is not jaundiced or pale.     Findings: No bruising, erythema, lesion or rash.  Neurological:     Mental Status: She is alert.  Psychiatric:        Mood and Affect: Mood normal.      UC Treatments / Results  Labs (all labs ordered are listed, but only abnormal results are displayed) Labs Reviewed - No data to display  EKG   Radiology No results found.  Procedures Procedures (including critical care time)  Medications Ordered in UC Medications - No data to display  Initial Impression / Assessment and Plan / UC Course  I have reviewed the triage vital signs and the nursing notes.  Pertinent labs & imaging results that were available during my care of the patient were reviewed by me and considered in my medical decision making (see chart for details).     Patient with history of peripheral vascular disease and claudication.  She has been having intermittent left lower extremity pain.  Concerned  for claudication.  Otherwise patient appears well on exam and extremity appears to be normal without vascular compromise. Spoke  with vascular and vein specialist and patient has an appointment at 1115 tomorrow. Patient agreed to follow-up with then .  No need to send to the ER today.  Final Clinical Impressions(s) / UC Diagnoses   Final diagnoses:  Pain of left lower extremity     Discharge Instructions     You have an appointment at 11:15 tomorrow with at the vascular office. Be there at 11:00 and make sure to wear your mask.       ED Prescriptions    None     Controlled Substance Prescriptions [place] Controlled Substance Registry consulted? Not Applicable   Orvan July, NP 09/10/18 [phone]

## 2018-09-11 ENCOUNTER — Other Ambulatory Visit: Payer: Self-pay

## 2018-09-11 ENCOUNTER — Ambulatory Visit (INDEPENDENT_AMBULATORY_CARE_PROVIDER_SITE_OTHER): Payer: Medicare Other | Admitting: Physician Assistant

## 2018-09-11 ENCOUNTER — Ambulatory Visit (HOSPITAL_COMMUNITY)
Admission: RE | Admit: 2018-09-11 | Discharge: 2018-09-11 | Disposition: A | Discharge status: Home / Self Care | Payer: Medicare Other | Source: Ambulatory Visit | Attending: Family | Admitting: Family

## 2018-09-11 VITALS — BP 146/60 | HR 63 | Temp 97.4°F | Resp 14 | Ht 64.0 in | Wt 123.0 lb

## 2018-09-11 DIAGNOSIS — M79605 Pain in left leg: Secondary | ICD-10-CM | POA: Diagnosis not present

## 2018-09-11 DIAGNOSIS — F17200 Nicotine dependence, unspecified, uncomplicated: Secondary | ICD-10-CM | POA: Diagnosis not present

## 2018-09-11 DIAGNOSIS — M79606 Pain in leg, unspecified: Secondary | ICD-10-CM

## 2018-09-11 DIAGNOSIS — I779 Disorder of arteries and arterioles, unspecified: Secondary | ICD-10-CM

## 2018-10-07 ENCOUNTER — Other Ambulatory Visit: Payer: Self-pay

## 2018-10-07 DIAGNOSIS — I779 Disorder of arteries and arterioles, unspecified: Secondary | ICD-10-CM

## 2018-10-07 DIAGNOSIS — I6523 Occlusion and stenosis of bilateral carotid arteries: Secondary | ICD-10-CM

## 2018-10-09 ENCOUNTER — Telehealth (HOSPITAL_COMMUNITY): Payer: Self-pay

## 2018-10-09 NOTE — Telephone Encounter (Signed)
Voicemail was left for patient including appointment time and information and instructions regarding COVID-19 safety procedures including mask usage, rescheduling if sympotmatic, and limited visitors in testing area.  

## 2018-10-12 ENCOUNTER — Ambulatory Visit (HOSPITAL_COMMUNITY)
Admission: RE | Admit: 2018-10-12 | Discharge: 2018-10-12 | Disposition: A | Discharge status: Home / Self Care | Payer: Medicare Other | Source: Ambulatory Visit | Attending: Family | Admitting: Family

## 2018-10-12 ENCOUNTER — Ambulatory Visit (INDEPENDENT_AMBULATORY_CARE_PROVIDER_SITE_OTHER)
Admission: RE | Admit: 2018-10-12 | Discharge: 2018-10-12 | Disposition: A | Discharge status: Home / Self Care | Payer: Medicare Other | Source: Ambulatory Visit | Attending: Family | Admitting: Family

## 2018-10-12 ENCOUNTER — Ambulatory Visit (INDEPENDENT_AMBULATORY_CARE_PROVIDER_SITE_OTHER): Payer: Medicare Other | Admitting: Surgery

## 2018-10-12 ENCOUNTER — Other Ambulatory Visit: Payer: Self-pay

## 2018-10-12 ENCOUNTER — Encounter: Payer: Self-pay | Admitting: Surgery

## 2018-10-12 VITALS — BP 133/59 | HR 66 | Temp 97.3°F | Resp 20 | Ht 64.0 in | Wt 125.3 lb

## 2018-10-12 DIAGNOSIS — I779 Disorder of arteries and arterioles, unspecified: Secondary | ICD-10-CM | POA: Diagnosis not present

## 2018-10-12 DIAGNOSIS — I6523 Occlusion and stenosis of bilateral carotid arteries: Secondary | ICD-10-CM

## 2018-10-12 DIAGNOSIS — I771 Stricture of artery: Secondary | ICD-10-CM | POA: Diagnosis not present

## 2018-10-12 NOTE — Progress Notes (Signed)
[facility] [place]  Patient name: Kimberly [name] MRN: [id number] DOB: June 29, 1950 Sex: female   REASON FOR VISIT:    Follow up  [place] ILLNESS:    Kimberly [name] is a 68 y.o. female who returns today for follow-up.  On 09/16/2017 she underwent stenting of her left subclavian artery and angioplasty of her left brachial artery.  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  She remained asymptomatic and was discharged home.  She no longer has numbness in her left arm following angioplasty.  She has not had any neurologic symptoms.  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 She went back for lysis of adhesions secondary to abdominal pain in April. In June 2013 she underwent left carotid endarterectomy for a high-grade asymptomatic stenosis. She then later presented with dizziness and temporary vision loss. On 06/24/2012 she had stenting of her left subclavian artery. 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 On 08/13/2013 she underwent right carotid endarterectomy for an asymptomatic right carotid stenosis  She recently began having recurrent episodes of dizziness. She went for angiography because of a left subclavian in-stent stenosis on 04/27/2015. She was found to have a high-grade stenosis and this was stented. On 04/30/2015 she presented with acute ischemia to the left hand. She was taken to the operating room by Dr. fields who performed a thrombectomy. On 12/05/2015 she underwent drug coated balloon angioplasty of a left subclavian in-stent stenosis. 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   Today, she is having several episodes per week of dizziness and lightheadedness.  She gets  numbness and pain in her legs when she sits down and hangs them over a chair.  Her left hand is not bothering her much today.   The patient is a smoker. She is medically managed for hypertension which is been under good control. She is not on cholesterol medicine for hypercholesterolemia   PAST MEDICAL HISTORY:   Past Medical History:  Diagnosis Date  . Anemia   . Angina    March 2013  . Anxiety   . Asthma   . Blood transfusion   . Depression   . Fibromyalgia   . History of lead poisoning 1977  . Hyperlipidemia   . Hypertension   . Migraine   . Peripheral vascular disease (HCC)    claudication  right greater than left  femoral artery  . Shortness of breath   . Small bowel obstruction, partial ([place]) 04/08/11  . Stroke ([place])   . Subclavian artery disease ([place])    left  . Uterine cancer ([place])    Pre cervical     FAMILY HISTORY:   Family History  Problem Relation Age of Onset  . Cancer Mother        breast, colon, ovarian  . Cancer Brother 50       lung  . Hyperlipidemia Brother   . Cancer Other   . Stroke Other   . Cancer Maternal Grandmother        ovarian  . Anesthesia problems Neg Hx     SOCIAL HISTORY:   Social History   Tobacco Use  . Smoking status: Current Some Day Smoker    Packs/day: 0.10    Years: 45.00    Pack years: 4.50    Types:  Cigarettes  . Smokeless tobacco: Never Used  . Tobacco comment: pt states that she smokes about 6 cigs per day  Substance Use Topics  . Alcohol use: No    Alcohol/week: 0.0 standard drinks     ALLERGIES:   Allergies  Allergen Reactions  . Codeine Nausea And Vomiting    Dizziness, headache, has passed out  . Hydrocodone Nausea And Vomiting and Other (See Comments)    Headache and dizziness  . Other Shortness Of Breath    Allergy to cologne/perfume base  . Oxycodone Nausea And Vomiting and Other (See Comments)    Dizziness      CURRENT MEDICATIONS:   Current Outpatient Medications  Medication Sig  Dispense Refill  . acetaminophen (TYLENOL) 500 MG tablet Take 1,000 mg by mouth daily as needed.    Marland Kitchen albuterol (PROVENTIL HFA;VENTOLIN HFA) 108 (90 Base) MCG/ACT inhaler Inhale 1-2 puffs into the lungs every 6 (six) hours as needed for wheezing or shortness of breath. 1 Inhaler 0  . aspirin 325 MG tablet Take 325 mg by mouth daily.     . clopidogrel (PLAVIX) 75 MG tablet Take 1 tablet (75 mg total) by mouth every evening. 90 tablet 2  . diphenhydrAMINE-zinc acetate (BENADRYL) cream Apply 1 application topically daily as needed for itching.    Marland Kitchen ibuprofen (ADVIL,MOTRIN) 200 MG tablet Take 800 mg by mouth daily as needed for moderate pain.    Marland Kitchen loratadine (CLARITIN) 10 MG tablet Take 10 mg by mouth daily.     No current facility-administered medications for this visit.     REVIEW OF SYSTEMS:   [X]  denotes positive finding, [ ]  denotes negative finding Cardiac  Comments:  Chest pain or chest pressure:    Shortness of breath upon exertion:    Short of breath when lying flat:    Irregular heart rhythm:        Vascular    Pain in calf, thigh, or hip brought on by ambulation:    Pain in feet at night that wakes you up from your sleep:     Blood clot in your veins:    Leg swelling:         Pulmonary    Oxygen at home:    Productive cough:     Wheezing:         Neurologic    Sudden weakness in arms or legs:     Sudden numbness in arms or legs:     Sudden onset of difficulty speaking or slurred speech:    Temporary loss of vision in one eye:     Problems with dizziness:         Gastrointestinal    Blood in stool:     Vomited blood:         Genitourinary    Burning when urinating:     Blood in urine:        Psychiatric    Major depression:         Hematologic    Bleeding problems:    Problems with blood clotting too easily:        Skin    Rashes or ulcers:        Constitutional    Fever or chills:      PHYSICAL EXAM:   Vitals:   10/12/18 1450 10/12/18 1454  BP:  116/64 (!) 133/59  Pulse: 66   Resp: 20   Temp: (!) 97.3 F (36.3 C)   SpO2: 100%  Weight: 125 lb 4.8 oz (56.8 kg)   Height: 5\' 4"  (1.626 m)     GENERAL: The patient is a well-nourished female, in no acute distress. The vital signs are documented above. CARDIAC: There is a regular rate and rhythm.  PULMONARY: Non-labored respirations MUSCULOSKELETAL: There are no major deformities or cyanosis. NEUROLOGIC: No focal weakness or paresthesias are detected. SKIN: There are no ulcers or rashes noted. PSYCHIATRIC: The patient has a normal affect.  STUDIES:   I have reviewed the following: CAROTID: Right Carotid: Velocities in the right ICA are consistent with a 40-59%                stenosis.  Left Carotid: Velocities in the left ICA are consistent with a 40-59% stenosis.  Vertebrals:  Right vertebral artery demonstrates antegrade flow. Left vertebral              artery demonstrates an occlusion. Subclavians: Left subclavian artery was stenotic. Normal flow hemodynamics were              seen in the right subclavian artery.  LEFT ARM: Left: >50% stenosis visualized in the left upper extremity.       Obstruction noted in the subclavian artery and brachial       artery.   +-------+-----------+-----------+------------+------------+ ABI/TBIToday's ABIToday's TBIPrevious ABIPrevious TBI +-------+-----------+-----------+------------+------------+ Right  1.02       0.66       1.01        0.72         +-------+-----------+-----------+------------+------------+ Left   0.73       0.53       0.93        0.77         +-------+-----------+-----------+------------+------------+  MEDICAL ISSUES:   Subclavian stent stenosis: I suspect this has a role in her dizziness.  I have recommended proceeding with angiogram via a femoral approach to evaluate this and intervene.  In addition I would evaluate her left brachial artery and repeat angioplasty if indicated.   Unfortunately the patient's significant other has just been diagnosed with lung cancer and she is having difficulty with this.  Because her symptoms have been stable for several months we will delay her angiogram until after Christmas.  I would like to get her on a statin.    Leia Alf, MD, FACS [facility] [phone] Pager [phone]

## 2018-12-02 DIAGNOSIS — I739 Peripheral vascular disease, unspecified: Secondary | ICD-10-CM | POA: Diagnosis not present

## 2018-12-02 DIAGNOSIS — F17200 Nicotine dependence, unspecified, uncomplicated: Secondary | ICD-10-CM | POA: Diagnosis not present

## 2018-12-02 DIAGNOSIS — Z1331 Encounter for screening for depression: Secondary | ICD-10-CM | POA: Diagnosis not present

## 2018-12-02 DIAGNOSIS — E7849 Other hyperlipidemia: Secondary | ICD-10-CM | POA: Diagnosis not present

## 2018-12-02 DIAGNOSIS — R87619 Unspecified abnormal cytological findings in specimens from cervix uteri: Secondary | ICD-10-CM | POA: Diagnosis not present

## 2018-12-02 DIAGNOSIS — Z8719 Personal history of other diseases of the digestive system: Secondary | ICD-10-CM | POA: Diagnosis not present

## 2018-12-02 DIAGNOSIS — E785 Hyperlipidemia, unspecified: Secondary | ICD-10-CM | POA: Insufficient documentation

## 2018-12-02 DIAGNOSIS — I771 Stricture of artery: Secondary | ICD-10-CM | POA: Diagnosis not present

## 2018-12-08 ENCOUNTER — Other Ambulatory Visit: Payer: Self-pay | Admitting: Internal Medicine

## 2018-12-08 DIAGNOSIS — Z1231 Encounter for screening mammogram for malignant neoplasm of breast: Secondary | ICD-10-CM

## 2018-12-10 DIAGNOSIS — Z23 Encounter for immunization: Secondary | ICD-10-CM | POA: Diagnosis not present

## 2018-12-18 ENCOUNTER — Encounter: Payer: Self-pay | Admitting: *Deleted

## 2018-12-18 NOTE — Progress Notes (Signed)
Call to patient to schedule procedure with Dr. Trula Slade. Patient requested 01/19/2019. Instructed to have nasal swab at [facility] on 01/15/2019 @ 9 am. To be at [facility] admitting at 5:30 am on 01/19/19. NPO past MN night prior. Inhalers as needed morning of and hold all other medications until after procedure. Must have a driver and caregiver for discharge. Verbalized understanding. All information for nasal swab and procedure mailed to patient also.

## 2019-01-16 ENCOUNTER — Other Ambulatory Visit (HOSPITAL_COMMUNITY)
Admission: RE | Admit: 2019-01-16 | Discharge: 2019-01-16 | Disposition: A | Discharge status: Home / Self Care | Payer: Medicare HMO | Source: Ambulatory Visit | Attending: Surgery | Admitting: Surgery

## 2019-01-16 DIAGNOSIS — Z20822 Contact with and (suspected) exposure to covid-19: Secondary | ICD-10-CM | POA: Diagnosis not present

## 2019-01-16 DIAGNOSIS — Z01812 Encounter for preprocedural laboratory examination: Secondary | ICD-10-CM | POA: Insufficient documentation

## 2019-01-16 LAB — SARS CORONAVIRUS 2 (TAT 6-24 HRS): SARS Coronavirus 2: NEGATIVE

## 2019-01-19 ENCOUNTER — Encounter (HOSPITAL_COMMUNITY)
Admission: RE | Disposition: A | Discharge status: Home / Self Care | Payer: Self-pay | Source: Home / Self Care | Attending: Surgery

## 2019-01-19 ENCOUNTER — Other Ambulatory Visit: Payer: Self-pay

## 2019-01-19 ENCOUNTER — Ambulatory Visit (HOSPITAL_COMMUNITY)
Admission: RE | Admit: 2019-01-19 | Discharge: 2019-01-19 | Disposition: A | Discharge status: Home / Self Care | Payer: Medicare HMO | Attending: Surgery | Admitting: Surgery

## 2019-01-19 DIAGNOSIS — Y839 Surgical procedure, unspecified as the cause of abnormal reaction of the patient, or of later complication, without mention of misadventure at the time of the procedure: Secondary | ICD-10-CM | POA: Diagnosis not present

## 2019-01-19 DIAGNOSIS — Z79899 Other long term (current) drug therapy: Secondary | ICD-10-CM | POA: Insufficient documentation

## 2019-01-19 DIAGNOSIS — Z7902 Long term (current) use of antithrombotics/antiplatelets: Secondary | ICD-10-CM | POA: Insufficient documentation

## 2019-01-19 DIAGNOSIS — T82858A Stenosis of vascular prosthetic devices, implants and grafts, initial encounter: Secondary | ICD-10-CM | POA: Insufficient documentation

## 2019-01-19 DIAGNOSIS — M797 Fibromyalgia: Secondary | ICD-10-CM | POA: Insufficient documentation

## 2019-01-19 DIAGNOSIS — M79602 Pain in left arm: Secondary | ICD-10-CM | POA: Insufficient documentation

## 2019-01-19 DIAGNOSIS — R42 Dizziness and giddiness: Secondary | ICD-10-CM | POA: Diagnosis not present

## 2019-01-19 DIAGNOSIS — F419 Anxiety disorder, unspecified: Secondary | ICD-10-CM | POA: Insufficient documentation

## 2019-01-19 DIAGNOSIS — F329 Major depressive disorder, single episode, unspecified: Secondary | ICD-10-CM | POA: Diagnosis not present

## 2019-01-19 DIAGNOSIS — Z7982 Long term (current) use of aspirin: Secondary | ICD-10-CM | POA: Diagnosis not present

## 2019-01-19 DIAGNOSIS — I10 Essential (primary) hypertension: Secondary | ICD-10-CM | POA: Diagnosis not present

## 2019-01-19 DIAGNOSIS — J45909 Unspecified asthma, uncomplicated: Secondary | ICD-10-CM | POA: Insufficient documentation

## 2019-01-19 DIAGNOSIS — Z8673 Personal history of transient ischemic attack (TIA), and cerebral infarction without residual deficits: Secondary | ICD-10-CM | POA: Insufficient documentation

## 2019-01-19 DIAGNOSIS — Z8542 Personal history of malignant neoplasm of other parts of uterus: Secondary | ICD-10-CM | POA: Diagnosis not present

## 2019-01-19 DIAGNOSIS — I739 Peripheral vascular disease, unspecified: Secondary | ICD-10-CM | POA: Insufficient documentation

## 2019-01-19 DIAGNOSIS — Z885 Allergy status to narcotic agent status: Secondary | ICD-10-CM | POA: Insufficient documentation

## 2019-01-19 DIAGNOSIS — F17210 Nicotine dependence, cigarettes, uncomplicated: Secondary | ICD-10-CM | POA: Insufficient documentation

## 2019-01-19 DIAGNOSIS — E785 Hyperlipidemia, unspecified: Secondary | ICD-10-CM | POA: Diagnosis not present

## 2019-01-19 DIAGNOSIS — E7800 Pure hypercholesterolemia, unspecified: Secondary | ICD-10-CM | POA: Insufficient documentation

## 2019-01-19 HISTORY — PX: PERIPHERAL VASCULAR INTERVENTION: CATH118257

## 2019-01-19 HISTORY — PX: AORTIC ARCH ANGIOGRAPHY: CATH118224

## 2019-01-19 HISTORY — PX: PERIPHERAL VASCULAR BALLOON ANGIOPLASTY: CATH118281

## 2019-01-19 LAB — POCT I-STAT, CHEM 8
BUN: 13 mg/dL (ref 8–23)
Calcium, Ion: 1.1 mmol/L — ABNORMAL LOW (ref 1.15–1.40)
Chloride: 102 mmol/L (ref 98–111)
Creatinine, Ser: 0.9 mg/dL (ref 0.44–1.00)
Glucose, Bld: 95 mg/dL (ref 70–99)
HCT: 42 % (ref 36.0–46.0)
Hemoglobin: 14.3 g/dL (ref 12.0–15.0)
Potassium: 3.6 mmol/L (ref 3.5–5.1)
Sodium: 140 mmol/L (ref 135–145)
TCO2: 28 mmol/L (ref 22–32)

## 2019-01-19 LAB — POCT ACTIVATED CLOTTING TIME
Activated Clotting Time: 235 seconds
Activated Clotting Time: 268 seconds
Activated Clotting Time: 202 seconds
Activated Clotting Time: 158 seconds

## 2019-01-19 SURGERY — AORTIC ARCH ANGIOGRAPHY
Anesthesia: LOCAL

## 2019-01-19 MED ORDER — MIDAZOLAM HCL 2 MG/2ML IJ SOLN
INTRAMUSCULAR | Status: AC
  Filled 2019-01-19: qty 2

## 2019-01-19 MED ORDER — HEPARIN (PORCINE) IN NACL 1000-0.9 UT/500ML-% IV SOLN
INTRAVENOUS | Status: AC
  Filled 2019-01-19: qty 1000

## 2019-01-19 MED ORDER — HEPARIN SODIUM (PORCINE) 1000 UNIT/ML IJ SOLN
INTRAMUSCULAR | Status: DC | PRN
  Administered 2019-01-19: 6000 [IU] via INTRAVENOUS

## 2019-01-19 MED ORDER — LABETALOL HCL 5 MG/ML IV SOLN: 10.0000 mg | INTRAVENOUS | Status: DC | PRN

## 2019-01-19 MED ORDER — MIDAZOLAM HCL 2 MG/2ML IJ SOLN
INTRAMUSCULAR | Status: DC | PRN
  Administered 2019-01-19: 1 mg via INTRAVENOUS
  Administered 2019-01-19: 0.5 mg via INTRAVENOUS

## 2019-01-19 MED ORDER — LIDOCAINE HCL (PF) 1 % IJ SOLN
INTRAMUSCULAR | Status: AC
  Filled 2019-01-19: qty 30

## 2019-01-19 MED ORDER — SODIUM CHLORIDE 0.9 % IV SOLN: INTRAVENOUS | Status: DC

## 2019-01-19 MED ORDER — ACETAMINOPHEN 325 MG PO TABS: 650.0000 mg | ORAL_TABLET | ORAL | Status: DC | PRN

## 2019-01-19 MED ORDER — FENTANYL CITRATE (PF) 100 MCG/2ML IJ SOLN
INTRAMUSCULAR | Status: AC
  Filled 2019-01-19: qty 2

## 2019-01-19 MED ORDER — LIDOCAINE HCL (PF) 1 % IJ SOLN
INTRAMUSCULAR | Status: DC | PRN
  Administered 2019-01-19: 15 mL via INTRADERMAL

## 2019-01-19 MED ORDER — HEPARIN SODIUM (PORCINE) 1000 UNIT/ML IJ SOLN
INTRAMUSCULAR | Status: AC
  Filled 2019-01-19: qty 1

## 2019-01-19 MED ORDER — SODIUM CHLORIDE 0.9 % WEIGHT BASED INFUSION: 1.0000 mL/kg/h | INTRAVENOUS | Status: DC

## 2019-01-19 MED ORDER — NITROGLYCERIN 1 MG/10 ML FOR IR/CATH LAB
INTRA_ARTERIAL | Status: DC | PRN
  Administered 2019-01-19 (×2): 200 ug via INTRA_ARTERIAL

## 2019-01-19 MED ORDER — IODIXANOL 320 MG/ML IV SOLN
INTRAVENOUS | Status: DC | PRN
  Administered 2019-01-19: 130 mL via INTRA_ARTERIAL

## 2019-01-19 MED ORDER — SODIUM CHLORIDE 0.9% FLUSH: 3.0000 mL | Freq: Two times a day (BID) | INTRAVENOUS | Status: DC

## 2019-01-19 MED ORDER — ONDANSETRON HCL 4 MG/2ML IJ SOLN: 4.0000 mg | Freq: Four times a day (QID) | INTRAMUSCULAR | Status: DC | PRN

## 2019-01-19 MED ORDER — SODIUM CHLORIDE 0.9 % IV SOLN: 250.0000 mL | INTRAVENOUS | Status: DC | PRN

## 2019-01-19 MED ORDER — FENTANYL CITRATE (PF) 100 MCG/2ML IJ SOLN
INTRAMUSCULAR | Status: DC | PRN
  Administered 2019-01-19 (×3): 25 ug via INTRAVENOUS

## 2019-01-19 MED ORDER — HEPARIN (PORCINE) IN NACL 1000-0.9 UT/500ML-% IV SOLN
INTRAVENOUS | Status: DC | PRN
  Administered 2019-01-19 (×2): 500 mL

## 2019-01-19 MED ORDER — NITROGLYCERIN 1 MG/10 ML FOR IR/CATH LAB
INTRA_ARTERIAL | Status: AC
  Filled 2019-01-19: qty 10

## 2019-01-19 MED ORDER — SODIUM CHLORIDE 0.9% FLUSH: 3.0000 mL | INTRAVENOUS | Status: DC | PRN

## 2019-01-19 MED ORDER — HYDRALAZINE HCL 20 MG/ML IJ SOLN: 5.0000 mg | INTRAMUSCULAR | Status: DC | PRN

## 2019-01-19 SURGICAL SUPPLY — 19 items
BALLN COYOTE ES OTW 2.5X20X143 (BALLOONS) ×3
BALLOON CYTE ES OTW 2.5X20X143 (BALLOONS) IMPLANT
CATH ANGIO 5F BER2 100CM (CATHETERS) ×1 IMPLANT
CATH ANGIO 5F PIGTAIL 100CM (CATHETERS) ×1 IMPLANT
KIT ENCORE 26 ADVANTAGE (KITS) ×1 IMPLANT
KIT MICROPUNCTURE NIT STIFF (SHEATH) ×1 IMPLANT
KIT PV (KITS) ×3 IMPLANT
SHEATH PINNACLE 5F 10CM (SHEATH) ×1 IMPLANT
SHEATH PINNACLE 7F 10CM (SHEATH) ×1 IMPLANT
SHEATH PROBE COVER 6X72 (BAG) ×1 IMPLANT
SHEATH SHUTTLE 7FR (SHEATH) ×1 IMPLANT
SHIELD RADPAD SCOOP 12X17 (MISCELLANEOUS) ×1 IMPLANT
STENT OMNILINK ELITE 8X19X135 (Permanent Stent) ×1 IMPLANT
SYR MEDRAD MARK V 150ML (SYRINGE) ×1 IMPLANT
TRANSDUCER W/STOPCOCK (MISCELLANEOUS) ×3 IMPLANT
TRAY PV CATH (CUSTOM PROCEDURE TRAY) ×3 IMPLANT
WIRE BENTSON .035X145CM (WIRE) ×1 IMPLANT
WIRE ROSEN-J .035X260CM (WIRE) ×1 IMPLANT
WIRE SPARTACORE .014X300CM (WIRE) ×1 IMPLANT

## 2019-01-19 NOTE — Progress Notes (Signed)
No bleeding or hematoma noted after ambulation 

## 2019-01-19 NOTE — Op Note (Signed)
Patient name: Kimberly [name] MRN: [id number] DOB: 1950-11-14 Sex: female  01/19/2019 Pre-operative Diagnosis: Left arm pain and dizziness Post-operative diagnosis:  Same Surgeon:  Annamarie Major Procedure Performed:  1.  Ultrasound-guided access, right femoral artery  2.  Aortic arch angiogram  3.  Left upper extremity runoff  4.  Stent, left subclavian artery (Herculink 8 x 19)  5.  Angioplasty, left brachial artery  6.  Conscious sedation 65 minutes  7.  Intra-arterial administration of nitroglycerin     Indications: Patient is well-known to me having undergone multiple interventions including carotid surgery and aortobifemoral bypass graft she has also had subclavian artery stenting.  She is having recurrent dizziness and left arm pain.  Ultrasound indicated that there was a stenosis proximally.  She comes in for further evaluation and possible treatment.  Procedure:  The patient was identified in the holding area and taken to room 8.  The patient was then placed supine on the table and prepped and draped in the usual sterile fashion.  A time out was called.  Conscious sedation was administered with the use of IV fentanyl and Versed under continuous physician and nurse monitoring.  Heart rate, blood pressure, and oxygen saturation were continuously monitored.  Total sedation time was 65 minutes.  Ultrasound was used to evaluate the right common femoral artery.  It was patent .  A digital ultrasound image was acquired.  A micropuncture needle was used to access the right common femoral artery under ultrasound guidance.  An 018 wire was advanced without resistance and a micropuncture sheath was placed.  The 018 wire was removed and a benson wire was placed.  The micropuncture sheath was exchanged for a 5 french sheath.  A pigtail catheter was advanced into the ascending aorta and an aortic arch angiogram was performed.  Next using a Berenstein 2 catheter, the subclavian artery was selected and  the catheter was placed in the axillary artery and left arm runoff was performed. Findings:   Aortic arch: A type I aortic arch is identified.  The origin of the innominate and left carotid arteries are calcified but widely patent.  A stent is visualized within the subclavian artery which is patent however there is approximately a 90% stenosis at its origin which is not covered with a stent.  Left arm: The subclavian artery just beyond the stent at 8 arch point has approximately a 40% stenosis.  The axillary artery is widely patent.  The brachial artery is patent throughout its course however just proximal to the elbow, there is a 80% focal lesion.  The dominant runoff to the hand is the radial artery.  The ulnar artery does cross the wrist joint.   Intervention: After the above images were acquired the decision was made to proceed with intervention.  A Rosen wire was inserted through the Boston 2 catheter which was removed and a 7 French 90 cm sheath was advanced out into the axillary artery.  I then advanced a [place] core wire across the lesion and perform primary balloon angioplasty using a 2.5 x 20 balloon.  400 mcg of nitroglycerin were also administered through the sheath into the artery.  After 1/92 inflation a completion arteriogram was performed which showed resolution of the stenosis with residual stenosis less than 10%.  At this point Sparta core wire was removed and exchanged out for a Rosen wire.  I elected to treat the origin of the subclavian artery.  I selected a 8 x 19  Herculink stent and deployed this back to the origin of the aorta.  It was taken to 11 atm.  Completion imaging revealed resolution of the stenosis.  Catheters wires were removed.  The long sheath was exchanged out for short 7 [place] sheath.  There are no complications.  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 Annamarie Major, M.D., [facility] [place] Office: [phone] Pager:  [phone]

## 2019-01-19 NOTE — H&P (Signed)
[facility] [place]  Patient name: Kimberly [name] MRN: [id number] DOB: 03-May-1950 Sex: female  REASON FOR VISIT:  Follow up  Izard ILLNESS:  Kimberly [name] is a 69 y.o. female who returns today for follow-up. On 09/16/2017 she underwent stenting of her left subclavian artery and angioplasty of her left brachial artery. 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 She remained asymptomatic and was discharged home.  She no longer has numbness in her left arm following angioplasty. She has not had any neurologic symptoms.  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 She went back for lysis of adhesions secondary to abdominal pain in April. In June 2013 she underwent left carotid endarterectomy for a high-grade asymptomatic stenosis. She then later presented with dizziness and temporary vision loss. On 06/24/2012 she had stenting of her left subclavian artery. 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 On 08/13/2013 she underwent right carotid endarterectomy for an asymptomatic right carotid stenosis  She recently began having recurrent episodes of dizziness. She went for angiography because of a left subclavian in-stent stenosis on 04/27/2015. She was found to have a high-grade stenosis and this was stented. On 04/30/2015 she presented with acute ischemia to the left hand. She was taken to the operating room by Dr. fields who performed a thrombectomy. On 12/05/2015 she underwent drug coated balloon angioplasty of a left subclavian in-stent stenosis. 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  Today, she is having several episodes per week of dizziness and lightheadedness. She gets numbness and pain in her legs when she sits down and hangs them over a chair. Her  left hand is not bothering her much today.  The patient is a smoker. She is medically managed for hypertension which is been under good control. She is not on cholesterol medicine for hypercholesterolemia  PAST MEDICAL HISTORY:       Past Medical History:  Diagnosis Date  . Anemia   . Angina    March 2013  . Anxiety   . Asthma   . Blood transfusion   . Depression   . Fibromyalgia   . History of lead poisoning 1977  . Hyperlipidemia   . Hypertension   . Migraine   . Peripheral vascular disease (HCC)    claudication right greater than left femoral artery  . Shortness of breath   . Small bowel obstruction, partial ([place]) 04/08/11  . Stroke ([place])   . 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 Uterine cancer ([place])    Pre cervical   FAMILY HISTORY:        Family History  Problem Relation Age of Onset  . Cancer Mother    breast, colon, ovarian  . Cancer Brother 50   lung  . Hyperlipidemia Brother   . Cancer Other   . Stroke Other   . Cancer Maternal Grandmother    ovarian  . Anesthesia problems Neg Hx    SOCIAL HISTORY:   Social History        Tobacco Use  . Smoking status: Current Some Day Smoker    Packs/day: 0.10    Years: 45.00    Pack years: 4.50    Types: Cigarettes  . Smokeless tobacco: Never Used  . Tobacco comment: pt states that she smokes about 6  cigs per day  Substance Use Topics  . Alcohol use: No    Alcohol/week: 0.0 standard drinks   ALLERGIES:        Allergies  Allergen Reactions  . Codeine Nausea And Vomiting    Dizziness, headache, has passed out  . Hydrocodone Nausea And Vomiting and Other (See Comments)    Headache and dizziness  . Other Shortness Of Breath    Allergy to cologne/perfume base  . Oxycodone Nausea And Vomiting and Other (See Comments)    Dizziness    CURRENT MEDICATIONS:         Current Outpatient Medications  Medication Sig Dispense Refill  . acetaminophen (TYLENOL) 500 MG tablet Take 1,000 mg by mouth daily as  needed.    Marland Kitchen albuterol (PROVENTIL HFA;VENTOLIN HFA) 108 (90 Base) MCG/ACT inhaler Inhale 1-2 puffs into the lungs every 6 (six) hours as needed for wheezing or shortness of breath. 1 Inhaler 0  . aspirin 325 MG tablet Take 325 mg by mouth daily.     . clopidogrel (PLAVIX) 75 MG tablet Take 1 tablet (75 mg total) by mouth every evening. 90 tablet 2  . diphenhydrAMINE-zinc acetate (BENADRYL) cream Apply 1 application topically daily as needed for itching.    Marland Kitchen ibuprofen (ADVIL,MOTRIN) 200 MG tablet Take 800 mg by mouth daily as needed for moderate pain.    Marland Kitchen loratadine (CLARITIN) 10 MG tablet Take 10 mg by mouth daily.     No current facility-administered medications for this visit.    REVIEW OF SYSTEMS:  [X]  denotes positive finding, [ ]  denotes negative finding  Cardiac  Comments:  Chest pain or chest pressure:    Shortness of breath upon exertion:    Short of breath when lying flat:    Irregular heart rhythm:        Vascular    Pain in calf, thigh, or hip brought on by ambulation:    Pain in feet at night that wakes you up from your sleep:     Blood clot in your veins:    Leg swelling:         Pulmonary    Oxygen at home:    Productive cough:     Wheezing:         Neurologic    Sudden weakness in arms or legs:     Sudden numbness in arms or legs:     Sudden onset of difficulty speaking or slurred speech:    Temporary loss of vision in one eye:     Problems with dizziness:         Gastrointestinal    Blood in stool:     Vomited blood:         Genitourinary    Burning when urinating:     Blood in urine:        Psychiatric    Major depression:         Hematologic    Bleeding problems:    Problems with blood clotting too easily:        Skin    Rashes or ulcers:        Constitutional    Fever or chills:     PHYSICAL EXAM:       Vitals:   10/12/18 1450 10/12/18 1454  BP: 116/64 (!) 133/59  Pulse: 66   Resp: 20   Temp: (!) 97.3 F (36.3 C)   SpO2: 100%     Weight: 125 lb 4.8 oz (56.8 kg)  Height: 5\' 4"  (1.626 m)    GENERAL: The patient is a well-nourished female, in no acute distress. The vital signs are documented above.  CARDIAC: There is a regular rate and rhythm.  PULMONARY: Non-labored respirations  MUSCULOSKELETAL: There are no major deformities or cyanosis.  NEUROLOGIC: No focal weakness or paresthesias are detected.  SKIN: There are no ulcers or rashes noted.  PSYCHIATRIC: The patient has a normal affect.  STUDIES:  I have reviewed the following:  CAROTID:  Right Carotid: Velocities in the right ICA are consistent with a 40-59%  stenosis.  Left Carotid: Velocities in the left ICA are consistent with a 40-59% stenosis.  Vertebrals: Right vertebral artery demonstrates antegrade flow. Left vertebral  artery demonstrates an occlusion.  Subclavians: Left subclavian artery was stenotic. Normal flow hemodynamics were  seen in the right subclavian artery.  LEFT ARM:  Left: >50% stenosis visualized in the left upper extremity.  Obstruction noted in the subclavian artery and brachial  artery.  +-------+-----------+-----------+------------+------------+  ABI/TBIToday's ABIToday's TBIPrevious ABIPrevious TBI  +-------+-----------+-----------+------------+------------+  Right 1.02 0.66 1.01 0.72   +-------+-----------+-----------+------------+------------+  Left 0.73 0.53 0.93 0.77   +-------+-----------+-----------+------------+------------+  MEDICAL ISSUES:  Subclavian stent stenosis: I suspect this has a role in her dizziness. I have recommended proceeding with angiogram via a femoral approach to evaluate this and intervene. In addition I would evaluate her left brachial artery and repeat angioplasty if indicated. Unfortunately the patient's significant other has just been diagnosed with lung cancer and she is having difficulty with this. Because her symptoms have been stable for several months we will delay her  angiogram until after Christmas. I would like to get her on a statin.  Leia Alf, MD, FACS  [facility] [phone]  Pager [phone]   Plan remains arch angio with left arm evaluation and possible intervention.  She has had no change in her symptoms.   Annamarie Major

## 2019-01-19 NOTE — Progress Notes (Signed)
Site area: Right groin a  7 french arterial sheath ws removed by [facility]  Site Prior to Removal:  Level 0  Pressure Applied For20 MINUTES    Bedrest  Beginning at 1235am  Manual:   Yes.    Patient Status During Pull:  stable  Post Pull Groin Site:  Level 0  Post Pull Instructions Given:  Yes.    Post Pull Pulses Present:  Yes.    Dressing Applied:  Yes.    Comments:

## 2019-01-19 NOTE — Discharge Instructions (Signed)
Femoral Site Care This sheet gives you information about how to care for yourself after your procedure. Your health care provider may also give you more specific instructions. If you have problems or questions, contact your health care provider. What can I expect after the procedure? After the procedure, it is common to have:  Bruising that usually fades within 1-2 weeks.  Tenderness at the site. Follow these instructions at home: Wound care  Follow instructions from your health care provider about how to take care of your insertion site. Make sure you: ? Wash your hands with soap and water before you change your bandage (dressing). If soap and water are not available, use hand sanitizer. ? Change your dressing as told by your health care provider. ? Leave stitches (sutures), skin glue, or adhesive strips in place. These skin closures may need to stay in place for 2 weeks or longer. If adhesive strip edges start to loosen and curl up, you may trim the loose edges. Do not remove adhesive strips completely unless your health care provider tells you to do that.  Do not take baths, swim, or use a hot tub until your health care provider approves.  You may shower 24-48 hours after the procedure or as told by your health care provider. ? Gently wash the site with plain soap and water. ? Pat the area dry with a clean towel. ? Do not rub the site. This may cause bleeding.  Do not apply powder or lotion to the site. Keep the site clean and dry.  Check your femoral site every day for signs of infection. Check for: ? Redness, swelling, or pain. ? Fluid or blood. ? Warmth. ? Pus or a bad smell. Activity  For the first 2-3 days after your procedure, or as long as directed: ? Avoid climbing stairs as much as possible. ? Do not squat.  Do not lift anything that is heavier than 10 lb (4.5 kg), or the limit that you are told, until your health care provider says that it is safe.  Rest as  directed. ? Avoid sitting for a long time without moving. Get up to take short walks every 1-2 hours.  Do not drive for 24 hours if you were given a medicine to help you relax (sedative). General instructions  Take over-the-counter and prescription medicines only as told by your health care provider.  Keep all follow-up visits as told by your health care provider. This is important. Contact a health care provider if you have:  A fever or chills.  You have redness, swelling, or pain around your insertion site. Get help right away if:  The catheter insertion area swells very fast.  You pass out.  You suddenly start to sweat or your skin gets clammy.  The catheter insertion area is bleeding, and the bleeding does not stop when you hold steady pressure on the area.  The area near or just beyond the catheter insertion site becomes pale, cool, tingly, or numb. These symptoms may represent a serious problem that is an emergency. Do not wait to see if the symptoms will go away. Get medical help right away. Call your local emergency services (911 in the [place] Do not drive yourself to the hospital. Summary  After the procedure, it is common to have bruising that usually fades within 1-2 weeks.  Check your femoral site every day for signs of infection.  Do not lift anything that is heavier than 10 lb (4.5 kg), or the   limit that you are told, until your health care provider says that it is safe. This information is not intended to replace advice given to you by your health care provider. Make sure you discuss any questions you have with your health care provider. Document Revised: 01/06/2017 Document Reviewed: 01/06/2017 Elsevier Patient Education  2020 [employer]vier Inc.  

## 2019-02-04 ENCOUNTER — Other Ambulatory Visit: Payer: Self-pay

## 2019-02-04 ENCOUNTER — Ambulatory Visit
Admission: RE | Admit: 2019-02-04 | Discharge: 2019-02-04 | Disposition: A | Discharge status: Home / Self Care | Payer: Medicare HMO | Source: Ambulatory Visit | Attending: Internal Medicine | Admitting: Internal Medicine

## 2019-02-04 DIAGNOSIS — Z1231 Encounter for screening mammogram for malignant neoplasm of breast: Secondary | ICD-10-CM | POA: Diagnosis not present

## 2019-02-28 ENCOUNTER — Ambulatory Visit: Payer: Medicare HMO | Attending: Internal Medicine

## 2019-02-28 DIAGNOSIS — Z23 Encounter for immunization: Secondary | ICD-10-CM | POA: Insufficient documentation

## 2019-02-28 NOTE — Progress Notes (Signed)
   Covid-19 Vaccination Clinic  Name:  Kimberly [name]    MRN: [id number] DOB: [birth year]  02/28/2019  Kimberly [name] was observed post Covid-19 immunization for 15 minutes without incidence. She was provided with Vaccine Information Sheet and instruction to access the V-Safe system.   Kimberly [name] was instructed to call 911 with any severe reactions post vaccine: Marland Kitchen Difficulty breathing  . Swelling of your face and throat  . A fast heartbeat  . A bad rash all over your body  . Dizziness and weakness    Immunizations Administered    Name Date Dose VIS Date Route   Pfizer COVID-19 Vaccine 02/28/2019 12:37 PM 0.3 mL 12/18/2018 Intramuscular   Manufacturer: [place]   Lot: [id number]   [place]: [id number]

## 2019-03-17 ENCOUNTER — Other Ambulatory Visit: Payer: Self-pay | Admitting: Surgery

## 2019-03-24 ENCOUNTER — Ambulatory Visit: Payer: Medicare HMO | Attending: Internal Medicine

## 2019-03-24 DIAGNOSIS — Z23 Encounter for immunization: Secondary | ICD-10-CM

## 2019-03-24 NOTE — Progress Notes (Signed)
   Covid-19 Vaccination Clinic  Name:  [name]    MRN: [id number] DOB: 18-Feb-1950  03/24/2019  [name] was observed post Covid-19 immunization for 15 minutes without incident. She was provided with Vaccine Information Sheet and instruction to access the V-Safe system.   [name] was instructed to call 911 with any severe reactions post vaccine: Marland Kitchen Difficulty breathing  . Swelling of face and throat  . A fast heartbeat  . A bad rash all over body  . Dizziness and weakness   Immunizations Administered    Name Date Dose VIS Date Route   Pfizer COVID-19 Vaccine 03/24/2019 11:09 AM 0.3 mL 12/18/2018 Intramuscular   Manufacturer: [place]   Lot: [id number]   [place]: [id number]

## 2019-06-16 ENCOUNTER — Other Ambulatory Visit: Payer: Self-pay | Admitting: Vascular Surgery

## 2019-08-13 ENCOUNTER — Telehealth: Payer: Self-pay | Admitting: *Deleted

## 2019-08-13 NOTE — Telephone Encounter (Signed)
Pt called complaining of dizziness and blurred vision. Symptoms similar to before subclavian stent placement. Pt specifically requested PA. Added to PA's schedule 8/9. Pt advised to go to ED if symptoms worsen.

## 2019-08-16 ENCOUNTER — Other Ambulatory Visit: Payer: Self-pay

## 2019-08-16 ENCOUNTER — Ambulatory Visit: Payer: Medicare HMO | Admitting: Physician Assistant

## 2019-08-16 ENCOUNTER — Other Ambulatory Visit (HOSPITAL_COMMUNITY): Payer: Self-pay | Admitting: Surgery

## 2019-08-16 ENCOUNTER — Ambulatory Visit (HOSPITAL_COMMUNITY)
Admission: RE | Admit: 2019-08-16 | Discharge: 2019-08-16 | Disposition: A | Discharge status: Home / Self Care | Payer: Medicare HMO | Source: Ambulatory Visit | Attending: Surgery | Admitting: Surgery

## 2019-08-16 VITALS — BP 121/67 | HR 58 | Temp 98.1°F | Resp 20 | Ht 64.0 in | Wt 121.6 lb

## 2019-08-16 DIAGNOSIS — F17200 Nicotine dependence, unspecified, uncomplicated: Secondary | ICD-10-CM

## 2019-08-16 DIAGNOSIS — I779 Disorder of arteries and arterioles, unspecified: Secondary | ICD-10-CM

## 2019-08-16 DIAGNOSIS — R42 Dizziness and giddiness: Secondary | ICD-10-CM

## 2019-08-16 DIAGNOSIS — I771 Stricture of artery: Secondary | ICD-10-CM

## 2019-08-16 DIAGNOSIS — Z09 Encounter for follow-up examination after completed treatment for conditions other than malignant neoplasm: Secondary | ICD-10-CM

## 2019-08-16 DIAGNOSIS — I6523 Occlusion and stenosis of bilateral carotid arteries: Secondary | ICD-10-CM | POA: Insufficient documentation

## 2019-08-16 NOTE — Progress Notes (Signed)
Office Note     CC:  follow up Requesting Provider:  Sueanne Margarita, DO  HPI: Kimberly [name] is a 69 y.o. (October 27, 1950) female who presents with approximately 1-1/2-week history of daily dizzy spells that last approximate 5 to 10 minutes.  She says there are no precipitating factors.  She also notices intermittent global vision disturbance that she describes as her vision "going out".  This does not affect one eye or the other.  These to last for 5 to 10 seconds.  These episodes are not accompanied by hemiplegia, monocular blindness or headache.  She denies upper extremity pain, numbness or slurred speech.    She describes chronic left buttock and calf cramps that will occur without provocation.  These will occur sometimes at night and she gets up and walks around with relief.  She states she does not necessarily get these with walking certain distances.  01/19/2019: left subclavian artery stent placement and left brachial artery balloon angioplasty secondary to dizziness and left arm pain. 04/26/2015: right brachial artery embolectomy 08/12/2013: right brachial artery drug coated balloon angioplasty 06/14/2012: Left subclavian artery stent placement 08/13/2013: left CEA 02/26/2011: aortobifemoral bypass graft  She takes aspirin 325 mg every other day.  She takes her Plavix daily.  She takes her statin approximately once a week due to side effects that include nausea and headache. Not diabetic, no hypertension Tobacco: 6 cigarettes/day (45 years)   Past Medical History:  Diagnosis Date  . Anemia   . Angina    March 2013  . Anxiety   . Asthma   . Blood transfusion   . Depression   . Fibromyalgia   . History of lead poisoning 1977  . Hyperlipidemia   . Hypertension   . Migraine   . Peripheral vascular disease (HCC)    claudication  right greater than left  femoral artery  . Shortness of breath   . Small bowel obstruction, partial ([place]) 04/08/11  . Stroke ([place])   . 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 Uterine cancer [facility])    Pre cervical    Past Surgical History:  Procedure Laterality Date  . abdominal angiogram    . ABDOMINAL ANGIOGRAM N/A 02/05/2011   Procedure: ABDOMINAL ANGIOGRAM;  Surgeon: Serafina Mitchell, MD;  Location: [facility] CATH LAB;  Service: Cardiovascular;  Laterality: N/A;  . ABDOMINAL HYSTERECTOMY  2008  . AORTA - BILATERAL FEMORAL ARTERY BYPASS GRAFT  02/26/2011   Procedure: AORTA BIFEMORAL BYPASS GRAFT;  Surgeon: Theotis Burrow, MD;  Location: [place];  Service: Vascular;  Laterality: N/A;  . AORTIC ARCH ANGIOGRAPHY N/A 09/16/2017   Procedure: AORTIC ARCH ANGIOGRAPHY;  Surgeon: Serafina Mitchell, MD;  Location: [place] CV LAB;  Service: Cardiovascular;  Laterality: N/A;  . AORTIC ARCH ANGIOGRAPHY N/A 01/19/2019   Procedure: AORTIC ARCH ANGIOGRAPHY;  Surgeon: Serafina Mitchell, MD;  Location: [place] CV LAB;  Service: Cardiovascular;  Laterality: N/A;  Left upper extremity angiography  . ARCH AORTOGRAM N/A 06/24/2012   Procedure: ARCH AORTOGRAM;  Surgeon: Serafina Mitchell, MD;  Location: [facility] CATH LAB;  Service: Cardiovascular;  Laterality: N/A;  . BILATERAL UPPER EXTREMITY ANGIOGRAM Left 08/12/2013   Procedure: UPPER EXTREMITY ANGIOGRAM;  Surgeon: Serafina Mitchell, MD;  Location: [facility] CATH LAB;  Service: Cardiovascular;  Laterality: Left;  . CAROTID ENDARTERECTOMY Left 06/13/2011   Left  CEA  . CAROTID ENDARTERECTOMY Right 08-13-13   CEA  . CHOLECYSTECTOMY N/A 12/08/2012   Procedure: LAPAROSCOPIC CHOLECYSTECTOMY  WITH INTRAOPERATIVE CHOLANGIOGRAM;  Surgeon: Haywood Lasso, MD;  Location: [place];  Service: General;  Laterality: N/A;  . COLONOSCOPY WITH PROPOFOL N/A 06/16/2014   Procedure: COLONOSCOPY WITH PROPOFOL;  Surgeon: Garlan Fair, MD;  Location: [facility] ENDOSCOPY;  Service: Endoscopy;  Laterality: N/A;  . EMBOLECTOMY Left 04/30/2015   Procedure: EMBOLECTOMY BRACHIAL, Radial and ulnar arteries with patch angioplasty of brachial artery.;  Surgeon: Elam Dutch, MD;  Location: [facility] OR;  Service: Vascular;  Laterality: Left;  . ENDARTERECTOMY  06/13/2011   Procedure: ENDARTERECTOMY CAROTID;  Surgeon: Serafina Mitchell, MD;  Location: [facility] OR;  Service: Vascular;  Laterality: Left;  Left carotid artery endarterectomy with vascu-guard patch angioplasty  . ENDARTERECTOMY Right 08/13/2013   Procedure: RIGHT  CAROTID ENDARTERECTOMY CAROTID WITH BOVINE PERICARDIAL PATCH ANGIOPLASTY;  Surgeon: Serafina Mitchell, MD;  Location: [place] OR;  Service: Vascular;  Laterality: Right;  . ESOPHAGOGASTRODUODENOSCOPY (EGD) WITH PROPOFOL N/A 06/16/2014   Procedure: ESOPHAGOGASTRODUODENOSCOPY (EGD) WITH PROPOFOL;  Surgeon: Garlan Fair, MD;  Location: [facility] ENDOSCOPY;  Service: Endoscopy;  Laterality: N/A;  . LAPAROTOMY  04/10/2011   Procedure: EXPLORATORY LAPAROTOMY;  Surgeon: Shann Medal, MD;  Location: [place];  Service: General;  Laterality: N/A;  ENTEROLYSIS OF ADHESIONS  . LOWER EXTREMITY ANGIOGRAM Left 06/24/12   Left stent intervention, left subclavian  . PERIPHERAL VASCULAR BALLOON ANGIOPLASTY Left 09/16/2017   Procedure: PERIPHERAL VASCULAR BALLOON ANGIOPLASTY;  Surgeon: Serafina Mitchell, MD;  Location: [place] CV LAB;  Service: Cardiovascular;  Laterality: Left;  brachial artery  . PERIPHERAL VASCULAR BALLOON ANGIOPLASTY Left 01/19/2019   Procedure: PERIPHERAL VASCULAR BALLOON ANGIOPLASTY;  Surgeon: Serafina Mitchell, MD;  Location: [place] CV LAB;  Service: Cardiovascular;  Laterality: Left;  brachial artery  . PERIPHERAL VASCULAR CATHETERIZATION N/A 04/26/2015   Procedure: Aortic Arch Angiography;  Surgeon: Serafina Mitchell, MD;  Location: [place] CV LAB;  Service: Cardiovascular;  Laterality: N/A;  . PERIPHERAL VASCULAR CATHETERIZATION  04/26/2015   Procedure: Peripheral Vascular Intervention;  Surgeon: Serafina Mitchell, MD;  Location: [place] CV LAB;  Service: Cardiovascular;;  lt illiac  . PERIPHERAL VASCULAR CATHETERIZATION N/A 12/05/2015   Procedure: Aortic  Arch Angiography;  Surgeon: Serafina Mitchell, MD;  Location: [place] CV LAB;  Service: Cardiovascular;  Laterality: N/A;  . PERIPHERAL VASCULAR CATHETERIZATION Left 12/05/2015   Procedure: Peripheral Vascular Intervention;  Surgeon: Serafina Mitchell, MD;  Location: [place] CV LAB;  Service: Cardiovascular;  Laterality: Left;  subclavian  . PERIPHERAL VASCULAR INTERVENTION Left 09/16/2017   Procedure: PERIPHERAL VASCULAR INTERVENTION;  Surgeon: Serafina Mitchell, MD;  Location: [place] CV LAB;  Service: Cardiovascular;  Laterality: Left;  subclavian artery  . PERIPHERAL VASCULAR INTERVENTION Left 01/19/2019   Procedure: PERIPHERAL VASCULAR INTERVENTION;  Surgeon: Serafina Mitchell, MD;  Location: [place] CV LAB;  Service: Cardiovascular;  Laterality: Left;  subclavian stent  . PR VEIN BYPASS GRAFT,AORTO-FEM-POP  02/26/11  . UPPER EXTREMITY ANGIOGRAPHY Left 09/16/2017   Procedure: Upper Extremity Angiography;  Surgeon: Serafina Mitchell, MD;  Location: [place] CV LAB;  Service: Cardiovascular;  Laterality: Left;    Social History   Socioeconomic History  . Marital status: Legally Separated    Spouse name: Not on file  . Number of children: Not on file  . Years of education: Not on file  . Highest education level: Not on file  Occupational History  . Not on file  Tobacco Use  . Smoking status: Current Some Day  Smoker    Packs/day: 0.10    Years: 45.00    Pack years: 4.50    Types: Cigarettes  . Smokeless tobacco: Never Used  . Tobacco comment: pt states that she smokes about 6 cigs per day  Vaping Use  . Vaping Use: Never used  Substance and Sexual Activity  . Alcohol use: No    Alcohol/week: 0.0 standard drinks  . Drug use: No    Comment: History of Cocaine and marijuana abuse: "not in many years" (04/08/11  . Sexual activity: Not Currently  Other Topics Concern  . Not on file  Social History Narrative   Pt lives at home with her 2 grandsons. She has been separated for  51yrs, has two children, and has a 12th grade education level. She drinks 3 cups of coffee daily.   Social Determinants of Health   Financial Resource Strain:   . Difficulty of Paying Living Expenses:   Food Insecurity:   . Worried About Charity fundraiser in the Last Year:   . Arboriculturist in the Last Year:   Transportation Needs:   . Film/video editor (Medical):   Marland Kitchen Lack of Transportation (Non-Medical):   Physical Activity:   . Days of Exercise per Week:   . Minutes of Exercise per Session:   Stress:   . Feeling of Stress :   Social Connections:   . Frequency of Communication with Friends and Family:   . Frequency of Social Gatherings with Friends and Family:   . Attends Religious Services:   . Active Member of Clubs or Organizations:   . Attends Archivist Meetings:   Marland Kitchen Marital Status:   Intimate Partner Violence:   . Fear of Current or Ex-Partner:   . Emotionally Abused:   Marland Kitchen Physically Abused:   . Sexually Abused:    Family History  Problem Relation Age of Onset  . Cancer Mother        breast, colon, ovarian  . Cancer Brother 50       lung  . Hyperlipidemia Brother   . Cancer Other   . Stroke Other   . Cancer Maternal Grandmother        ovarian  . Anesthesia problems Neg Hx     Current Outpatient Medications  Medication Sig Dispense Refill  . acetaminophen (TYLENOL) 650 MG CR tablet Take 1,300 mg by mouth every 8 (eight) hours as needed for pain.    Marland Kitchen albuterol (PROVENTIL HFA;VENTOLIN HFA) 108 (90 Base) MCG/ACT inhaler Inhale 1-2 puffs into the lungs every 6 (six) hours as needed for wheezing or shortness of breath. 1 Inhaler 0  . Aromatic Inhalants (VICKS VAPOINHALER IN) Inhale 1 puff into the lungs daily as needed (congestion).    . Ascorbic Acid (VITAMIN C) 1000 MG tablet Take 1,000 mg by mouth daily.    . ASPERCREME LIDOCAINE EX Apply 1 application topically daily as needed (back pain).    Marland Kitchen aspirin 325 MG tablet Take 325 mg by mouth daily.      Marland Kitchen atorvastatin (LIPITOR) 20 MG tablet Take 20 mg by mouth at bedtime.    . clopidogrel (PLAVIX) 75 MG tablet TAKE ONE TABLET BY MOUTH AT BEDTIME 90 tablet 2  . diphenhydrAMINE-zinc acetate (BENADRYL) cream Apply 1 application topically daily as needed for itching.    . ferrous sulfate 325 (65 FE) MG tablet Take 325 mg by mouth every other day.    . ibuprofen (ADVIL,MOTRIN) 200 MG tablet Take  200-400 mg by mouth daily as needed for moderate pain.     Marland Kitchen loratadine (CLARITIN) 10 MG tablet Take 10 mg by mouth daily as needed for allergies.     Marland Kitchen OVER THE COUNTER MEDICATION Take 1 tablet by mouth every other day. MK-7    . vitamin B-12 (CYANOCOBALAMIN) 1000 MCG tablet Take 1,000 mcg by mouth daily.     No current facility-administered medications for this visit.    Allergies  Allergen Reactions  . Codeine Nausea And Vomiting    Dizziness, headache, has passed out  . Hydrocodone Nausea And Vomiting and Other (See Comments)    Headache and dizziness  . Other Shortness Of Breath    Allergy to cologne/perfume base  . Oxycodone Nausea And Vomiting and Other (See Comments)    Dizziness      REVIEW OF SYSTEMS:   [X]  denotes positive finding, [ ]  denotes negative finding Cardiac  Comments:  Chest pain or chest pressure:    Shortness of breath upon exertion:    Short of breath when lying flat:    Irregular heart rhythm:        Vascular    Pain in calf, thigh, or hip brought on by ambulation:    Pain in feet at night that wakes you up from your sleep:     Blood clot in your veins:    Leg swelling:         Pulmonary    Oxygen at home:    Productive cough:     Wheezing:         Neurologic    Sudden weakness in arms or legs:     Sudden numbness in arms or legs:     Sudden onset of difficulty speaking or slurred speech:    Temporary loss of vision in one eye:     Problems with dizziness:  x       Gastrointestinal    Blood in stool:     Vomited blood:         Genitourinary     Burning when urinating:     Blood in urine:        Psychiatric    Major depression:         Hematologic    Bleeding problems:    Problems with blood clotting too easily:        Skin    Rashes or ulcers:        Constitutional    Fever or chills:      PHYSICAL EXAMINATION:  Vitals:   08/16/19 0916 08/16/19 0918  BP: 128/64 121/67  Pulse: (!) 58   Resp: 20   Temp: 98.1 F (36.7 C)   SpO2: 99%    General:  WDWN in NAD; vital signs documented above Gait: No ataxia, unaided HENT: WNL, normocephalic Pulmonary: normal non-labored breathing , without Rales, rhonchi,  wheezing Cardiac: regular HR, with systolic murmur; with bilateral carotid bruits Abdomen: soft, NT, no masses Skin: without rashes.  Multiple  ecchymotic areas of the upper extremities she says that are due to mosquito bites Vascular Exam/Pulses: She has 2+ and equal brachial and radial pulses bilaterally.  2+ femoral pulses bilaterally.  1+ dorsalis pedis pulses bilaterally Extremities: without ischemic changes, without Gangrene , without cellulitis; without open wounds;  Musculoskeletal: no muscle wasting or atrophy  Neurologic: A&O X 3;  No focal weakness or paresthesias are detected Psychiatric:  The pt has Normal affect.   Non-Invasive Vascular Imaging:  Right Carotid: Velocities in the right ICA are consistent with a 40-59%  stenosis.   Left Carotid: Velocities in the left ICA are consistent with a 40-59%  Stenosis. The ECA appears >50% stenosed.   Subclavians: Normal flow hemodynamics were seen in bilateral subclavian  arteries.   Small velocity of the left subclavian artery is 204 cm/s.  The ostial stent is not well visualized, however the artery is multiphasic and patent.  ASSESSMENT/PLAN:: 69 y.o. female here for follow up for 1-1/2 weeks duration of daily dizzy spells and intermittent vision disturbances.  Her duplex today does not reveal subclavian stenosis.  Her blood pressure readings are  equal in both upper extremities.  I discussed the case today with Dr. Trula Slade who went over the results of her duplex and conclusions of her visit today.  He recommended a neurological consultation and the patient desires to discuss this consultation with her primary care physician and obtain assistance in the referral.  She has a follow-up appointment in the near future.  She is due for ABIs and lower extremity arterial duplex in 2 to 3 months.  We will make arrangements for this office visit today.  Barbie Banner, PA-C [facility] [phone]  Clinic MD:   Trula Slade

## 2019-08-18 ENCOUNTER — Other Ambulatory Visit: Payer: Self-pay | Admitting: *Deleted

## 2019-08-18 DIAGNOSIS — I779 Disorder of arteries and arterioles, unspecified: Secondary | ICD-10-CM

## 2019-08-20 ENCOUNTER — Ambulatory Visit (HOSPITAL_COMMUNITY)
Admission: EM | Admit: 2019-08-20 | Discharge: 2019-08-20 | Disposition: A | Discharge status: Home / Self Care | Payer: Medicare HMO | Attending: Family Medicine | Admitting: Family Medicine

## 2019-08-20 ENCOUNTER — Encounter (HOSPITAL_COMMUNITY): Payer: Self-pay

## 2019-08-20 ENCOUNTER — Other Ambulatory Visit: Payer: Self-pay

## 2019-08-20 DIAGNOSIS — S60511A Abrasion of right hand, initial encounter: Secondary | ICD-10-CM | POA: Diagnosis not present

## 2019-08-20 DIAGNOSIS — W5503XA Scratched by cat, initial encounter: Secondary | ICD-10-CM

## 2019-08-20 MED ORDER — AMOXICILLIN-POT CLAVULANATE 875-125 MG PO TABS: 1.0000 | ORAL_TABLET | Freq: Two times a day (BID) | ORAL | 0 refills | Status: DC

## 2019-08-20 NOTE — ED Triage Notes (Signed)
Pt presents with cat scratch to top of right hand ( cat is up to date on vaccines)from yesterday, Pt is on blood thinners.  Pt right hand is swollen and bruised.

## 2019-08-20 NOTE — ED Provider Notes (Addendum)
[place]   [id number] 08/20/19 Arrival Time: 6546  ASSESSMENT & PLAN:  1. Cat scratch of right hand, initial encounter     Meds ordered this encounter  Medications  . amoxicillin-clavulanate (AUGMENTIN) 875-125 MG tablet    Sig: Take 1 tablet by mouth every 12 (twelve) hours.    Dispense:  20 tablet    Refill:  0     Follow-up Information    [place].   Specialty: Emergency Medicine Why: If symptoms worsen in any way. Contact information: [address] [phone] [place] [phone]               Reviewed expectations re: course of current medical issues. Questions answered. Outlined signs and symptoms indicating need for more acute intervention. Patient verbalized understanding. After Visit Summary given.   SUBJECTIVE:  Kimberly [name] is a 69 y.o. 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 She feels cat has had vaccines and it UTD. Reports R hand swelling; improved today but still present. Significant erythema and warmth. No extremity sensation changes or weakness. Questions subj fever yesterday. Washed wound well after injury.  Feels Td is UTD.    OBJECTIVE: Vitals:   08/20/19 1313  BP: (!) 157/61  Pulse: 64  Resp: 17  Temp: 98.1 F (36.7 C)  TempSrc: Oral  SpO2: 98%    General appearance: alert; no distress HEENT: [place]; AT Neck: supple with FROM Lungs: clear to auscultation bilaterally Heart: regular rate and rhythm Extremities: no edema; moves all extremities normally Skin: warm and dry; scratch wound to dorsal R hand; no bleeding; surrounding erythema; warm to touch; moderate swelling of hand; FROM of all fingers Psychological: alert and cooperative; normal mood and affect  Allergies  Allergen Reactions  . Codeine Nausea And Vomiting    Dizziness, headache, has  passed out  . Hydrocodone Nausea And Vomiting and Other (See Comments)    Headache and dizziness  . Other Shortness Of Breath    Allergy to cologne/perfume base  . Oxycodone Nausea And Vomiting and Other (See Comments)    Dizziness     Past Medical History:  Diagnosis Date  . Anemia   . Angina    March 2013  . Anxiety   . Asthma   . Blood transfusion   . Depression   . Fibromyalgia   . History of lead poisoning 1977  . Hyperlipidemia   . Hypertension   . Migraine   . Peripheral vascular disease (HCC)    claudication  right greater than left  femoral artery  . Shortness of breath   . Small bowel obstruction, partial ([place]) 04/08/11  . Stroke ([place])   . Subclavian artery disease ([place])    left  . Uterine cancer ([place])    Pre cervical   Social History   Socioeconomic History  . Marital status: Legally Separated    Spouse name: Not on file  . Number of children: Not on file  . Years of education: Not on file  . Highest education level: Not on file  Occupational History  . Not on file  Tobacco Use  . Smoking status: Current Some Day Smoker    Packs/day: 0.10    Years: 45.00    Pack years: 4.50    Types: Cigarettes  . Smokeless tobacco: Never Used  . Tobacco comment: pt states that she smokes about  6 cigs per day  Vaping Use  . Vaping Use: Never used  Substance and Sexual Activity  . Alcohol use: No    Alcohol/week: 0.0 standard drinks  . Drug use: No    Comment: History of Cocaine and marijuana abuse: "not in many years" (04/08/11  . Sexual activity: Not Currently  Other Topics Concern  . Not on file  Social History Narrative   Pt lives at home with her 2 grandsons. She has been separated for 87yrs, has two children, and has a 12th grade education level. She drinks 3 cups of coffee daily.   Social Determinants of Health   Financial Resource Strain:   . Difficulty of Paying Living Expenses:   Food Insecurity:   . Worried About Charity fundraiser in the Last  Year:   . Arboriculturist in the Last Year:   Transportation Needs:   . Film/video editor (Medical):   Marland Kitchen Lack of Transportation (Non-Medical):   Physical Activity:   . Days of Exercise per Week:   . Minutes of Exercise per Session:   Stress:   . Feeling of Stress :   Social Connections:   . Frequency of Communication with Friends and Family:   . Frequency of Social Gatherings with Friends and Family:   . Attends Religious Services:   . Active Member of Clubs or Organizations:   . Attends Archivist Meetings:   Marland Kitchen Marital Status:   Intimate Partner Violence:   . Fear of Current or Ex-Partner:   . Emotionally Abused:   Marland Kitchen Physically Abused:   . Sexually Abused:    Family History  Problem Relation Age of Onset  . Cancer Mother        breast, colon, ovarian  . Cancer Brother 50       lung  . Hyperlipidemia Brother   . Cancer Other   . Stroke Other   . Cancer Maternal Grandmother        ovarian  . Anesthesia problems Neg Hx    Past Surgical History:  Procedure Laterality Date  . abdominal angiogram    . ABDOMINAL ANGIOGRAM N/A 02/05/2011   Procedure: ABDOMINAL ANGIOGRAM;  Surgeon: Serafina Mitchell, MD;  Location: [facility] CATH LAB;  Service: Cardiovascular;  Laterality: N/A;  . ABDOMINAL HYSTERECTOMY  2008  . AORTA - BILATERAL FEMORAL ARTERY BYPASS GRAFT  02/26/2011   Procedure: AORTA BIFEMORAL BYPASS GRAFT;  Surgeon: Theotis Burrow, MD;  Location: [place];  Service: Vascular;  Laterality: N/A;  . AORTIC ARCH ANGIOGRAPHY N/A 09/16/2017   Procedure: AORTIC ARCH ANGIOGRAPHY;  Surgeon: Serafina Mitchell, MD;  Location: [place] CV LAB;  Service: Cardiovascular;  Laterality: N/A;  . AORTIC ARCH ANGIOGRAPHY N/A 01/19/2019   Procedure: AORTIC ARCH ANGIOGRAPHY;  Surgeon: Serafina Mitchell, MD;  Location: [place] CV LAB;  Service: Cardiovascular;  Laterality: N/A;  Left upper extremity angiography  . ARCH AORTOGRAM N/A 06/24/2012   Procedure: ARCH AORTOGRAM;  Surgeon: Serafina Mitchell, MD;  Location: [facility] CATH LAB;  Service: Cardiovascular;  Laterality: N/A;  . BILATERAL UPPER EXTREMITY ANGIOGRAM Left 08/12/2013   Procedure: UPPER EXTREMITY ANGIOGRAM;  Surgeon: Serafina Mitchell, MD;  Location: [facility] CATH LAB;  Service: Cardiovascular;  Laterality: Left;  . CAROTID ENDARTERECTOMY Left 06/13/2011   Left  CEA  . CAROTID ENDARTERECTOMY Right 08-13-13   CEA  . CHOLECYSTECTOMY N/A 12/08/2012   Procedure: LAPAROSCOPIC CHOLECYSTECTOMY WITH INTRAOPERATIVE CHOLANGIOGRAM;  Surgeon: Haywood Lasso, MD;  Location: [facility] OR;  Service: General;  Laterality: N/A;  . COLONOSCOPY WITH PROPOFOL N/A 06/16/2014   Procedure: COLONOSCOPY WITH PROPOFOL;  Surgeon: Garlan Fair, MD;  Location: [facility] ENDOSCOPY;  Service: Endoscopy;  Laterality: N/A;  . EMBOLECTOMY Left 04/30/2015   Procedure: EMBOLECTOMY BRACHIAL, Radial and ulnar arteries with patch angioplasty of brachial artery.;  Surgeon: Elam Dutch, MD;  Location: [facility] OR;  Service: Vascular;  Laterality: Left;  . ENDARTERECTOMY  06/13/2011   Procedure: ENDARTERECTOMY CAROTID;  Surgeon: Serafina Mitchell, MD;  Location: [facility] OR;  Service: Vascular;  Laterality: Left;  Left carotid artery endarterectomy with vascu-guard patch angioplasty  . ENDARTERECTOMY Right 08/13/2013   Procedure: RIGHT  CAROTID ENDARTERECTOMY CAROTID WITH BOVINE PERICARDIAL PATCH ANGIOPLASTY;  Surgeon: Serafina Mitchell, MD;  Location: [place] OR;  Service: Vascular;  Laterality: Right;  . ESOPHAGOGASTRODUODENOSCOPY (EGD) WITH PROPOFOL N/A 06/16/2014   Procedure: ESOPHAGOGASTRODUODENOSCOPY (EGD) WITH PROPOFOL;  Surgeon: Garlan Fair, MD;  [facility];  Service: Endoscopy;  Laterality: N/A;  . LAPAROTOMY  04/10/2011   Procedure: EXPLORATORY LAPAROTOMY;  Surgeon: Shann Medal, MD;  Location: [place];  Service: General;  Laterality: N/A;  ENTEROLYSIS OF ADHESIONS  . LOWER EXTREMITY ANGIOGRAM Left 06/24/12   Left stent intervention, left subclavian  . PERIPHERAL VASCULAR BALLOON  ANGIOPLASTY Left 09/16/2017   Procedure: PERIPHERAL VASCULAR BALLOON ANGIOPLASTY;  Surgeon: Serafina Mitchell, MD;  Location: [place] CV LAB;  Service: Cardiovascular;  Laterality: Left;  brachial artery  . PERIPHERAL VASCULAR BALLOON ANGIOPLASTY Left 01/19/2019   Procedure: PERIPHERAL VASCULAR BALLOON ANGIOPLASTY;  Surgeon: Serafina Mitchell, MD;  Location: [place] CV LAB;  Service: Cardiovascular;  Laterality: Left;  brachial artery  . PERIPHERAL VASCULAR CATHETERIZATION N/A 04/26/2015   Procedure: Aortic Arch Angiography;  Surgeon: Serafina Mitchell, MD;  Location: [place] CV LAB;  Service: Cardiovascular;  Laterality: N/A;  . PERIPHERAL VASCULAR CATHETERIZATION  04/26/2015   Procedure: Peripheral Vascular Intervention;  Surgeon: Serafina Mitchell, MD;  Location: [place] CV LAB;  Service: Cardiovascular;;  lt illiac  . PERIPHERAL VASCULAR CATHETERIZATION N/A 12/05/2015   Procedure: Aortic Arch Angiography;  Surgeon: Serafina Mitchell, MD;  Location: [place] CV LAB;  Service: Cardiovascular;  Laterality: N/A;  . PERIPHERAL VASCULAR CATHETERIZATION Left 12/05/2015   Procedure: Peripheral Vascular Intervention;  Surgeon: Serafina Mitchell, MD;  Location: [place] CV LAB;  Service: Cardiovascular;  Laterality: Left;  subclavian  . PERIPHERAL VASCULAR INTERVENTION Left 09/16/2017   Procedure: PERIPHERAL VASCULAR INTERVENTION;  Surgeon: Serafina Mitchell, MD;  Location: [place] CV LAB;  Service: Cardiovascular;  Laterality: Left;  subclavian artery  . PERIPHERAL VASCULAR INTERVENTION Left 01/19/2019   Procedure: PERIPHERAL VASCULAR INTERVENTION;  Surgeon: Serafina Mitchell, MD;  Location: [place] CV LAB;  Service: Cardiovascular;  Laterality: Left;  subclavian stent  . PR VEIN BYPASS GRAFT,AORTO-FEM-POP  02/26/11  . UPPER EXTREMITY ANGIOGRAPHY Left 09/16/2017   Procedure: Upper Extremity Angiography;  Surgeon: Serafina Mitchell, MD;  Location: [place] CV LAB;  Service: Cardiovascular;   Laterality: Left;     Vanessa Kick, MD 08/20/19 [place], MD 08/20/19 [phone]

## 2019-10-08 ENCOUNTER — Other Ambulatory Visit: Payer: Self-pay

## 2019-10-08 ENCOUNTER — Ambulatory Visit (HOSPITAL_COMMUNITY)
Admission: EM | Admit: 2019-10-08 | Discharge: 2019-10-08 | Disposition: A | Discharge status: Home / Self Care | Payer: Medicare HMO | Attending: Family Medicine | Admitting: Family Medicine

## 2019-10-08 DIAGNOSIS — R6884 Jaw pain: Secondary | ICD-10-CM | POA: Diagnosis not present

## 2019-10-08 MED ORDER — AMOXICILLIN-POT CLAVULANATE 875-125 MG PO TABS: 1.0000 | ORAL_TABLET | Freq: Two times a day (BID) | ORAL | 0 refills | Status: DC

## 2019-10-08 NOTE — ED Provider Notes (Signed)
[place]    CSN: [id number] Arrival date & time: 10/08/19  1624      History   Chief Complaint Chief Complaint  Patient presents with  . Jaw Pain    HPI Kimberly [name] is a 69 y.o. female.   69 year old smoker with known widespread vascular disease.  Here for jaw pain.  She states she has bad teeth.  She has swelling on the left side of her jaw.  No pain or TMJ or ear.  The swelling in her jaw has redness, warmth, and pain.  Has been present since yesterday.  She thinks is from her teeth.  She has not taken any medicine for pain.      Past Medical History:  Diagnosis Date  . Anemia   . Angina    March 2013  . Anxiety   . Asthma   . Blood transfusion   . Depression   . Fibromyalgia   . History of lead poisoning 1977  . Hyperlipidemia   . Hypertension   . Migraine   . Peripheral vascular disease (HCC)    claudication  right greater than left  femoral artery  . Shortness of breath   . Small bowel obstruction, partial ([place]) 04/08/11  . Stroke ([place])   . Subclavian artery disease ([place])    left  . Uterine cancer [facility])    Pre cervical    Patient Active Problem List   Diagnosis Date Noted  . Neck pain 08/28/2013  . Wound infection after surgery 08/28/2013  . Swelling of limb-Right neck 08/19/2013  . Weakness 06/29/2012  . Small bowel obstruction due to adhesions.  Enterolysis 04/10/2011. 05/02/2011  . Ileus ([place]) 04/08/2011  . PVD (peripheral vascular disease) ([place]) 04/08/2011  . 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 Occlusion and stenosis of carotid artery without mention of cerebral infarction 03/25/2011  . Atherosclerosis of native arteries of the extremities with intermittent claudication 03/14/2011  . PVD (peripheral vascular disease) with claudication ([place]) 02/06/2011  . Subclavian artery stenosis ([place]) 01/28/2011  . PAP SMEAR, LGSIL, ABNORMAL 03/30/2010  . HYPERCHOLESTEROLEMIA 03/28/2010  . INTERMITTENT CLAUDICATION 03/27/2010  .  LYMPHADENOPATHY 02/06/2010  . TOBACCO ABUSE 12/07/2009  . DENTAL PAIN 12/07/2009  . MENOPAUSE, SURGICAL 01/07/2005    Past Surgical History:  Procedure Laterality Date  . abdominal angiogram    . ABDOMINAL ANGIOGRAM N/A 02/05/2011   Procedure: ABDOMINAL ANGIOGRAM;  Surgeon: Serafina Mitchell, MD;  Location: [facility] CATH LAB;  Service: Cardiovascular;  Laterality: N/A;  . ABDOMINAL HYSTERECTOMY  2008  . AORTA - BILATERAL FEMORAL ARTERY BYPASS GRAFT  02/26/2011   Procedure: AORTA BIFEMORAL BYPASS GRAFT;  Surgeon: Theotis Burrow, MD;  Location: [place];  Service: Vascular;  Laterality: N/A;  . AORTIC ARCH ANGIOGRAPHY N/A 09/16/2017   Procedure: AORTIC ARCH ANGIOGRAPHY;  Surgeon: Serafina Mitchell, MD;  Location: [place] CV LAB;  Service: Cardiovascular;  Laterality: N/A;  . AORTIC ARCH ANGIOGRAPHY N/A 01/19/2019   Procedure: AORTIC ARCH ANGIOGRAPHY;  Surgeon: Serafina Mitchell, MD;  Location: [place] CV LAB;  Service: Cardiovascular;  Laterality: N/A;  Left upper extremity angiography  . ARCH AORTOGRAM N/A 06/24/2012   Procedure: ARCH AORTOGRAM;  Surgeon: Serafina Mitchell, MD;  Location: [facility] CATH LAB;  Service: Cardiovascular;  Laterality: N/A;  . BILATERAL UPPER EXTREMITY ANGIOGRAM Left 08/12/2013   Procedure: UPPER EXTREMITY ANGIOGRAM;  Surgeon: Serafina Mitchell, MD;  Location: [facility] CATH LAB;  Service: Cardiovascular;  Laterality: Left;  . CAROTID ENDARTERECTOMY  Left 06/13/2011   Left  CEA  . CAROTID ENDARTERECTOMY Right 08-13-13   CEA  . CHOLECYSTECTOMY N/A 12/08/2012   Procedure: LAPAROSCOPIC CHOLECYSTECTOMY WITH INTRAOPERATIVE CHOLANGIOGRAM;  Surgeon: Haywood Lasso, MD;  Location: [place];  Service: General;  Laterality: N/A;  . COLONOSCOPY WITH PROPOFOL N/A 06/16/2014   Procedure: COLONOSCOPY WITH PROPOFOL;  Surgeon: Garlan Fair, MD;  [facility];  Service: Endoscopy;  Laterality: N/A;  . EMBOLECTOMY Left 04/30/2015   Procedure: EMBOLECTOMY BRACHIAL, Radial and ulnar arteries with  patch angioplasty of brachial artery.;  Surgeon: Elam Dutch, MD;  Location: [facility] OR;  Service: Vascular;  Laterality: Left;  . ENDARTERECTOMY  06/13/2011   Procedure: ENDARTERECTOMY CAROTID;  Surgeon: Serafina Mitchell, MD;  Location: [facility] OR;  Service: Vascular;  Laterality: Left;  Left carotid artery endarterectomy with vascu-guard patch angioplasty  . ENDARTERECTOMY Right 08/13/2013   Procedure: RIGHT  CAROTID ENDARTERECTOMY CAROTID WITH BOVINE PERICARDIAL PATCH ANGIOPLASTY;  Surgeon: Serafina Mitchell, MD;  Location: [place] OR;  Service: Vascular;  Laterality: Right;  . ESOPHAGOGASTRODUODENOSCOPY (EGD) WITH PROPOFOL N/A 06/16/2014   Procedure: ESOPHAGOGASTRODUODENOSCOPY (EGD) WITH PROPOFOL;  Surgeon: Garlan Fair, MD;  [facility];  Service: Endoscopy;  Laterality: N/A;  . LAPAROTOMY  04/10/2011   Procedure: EXPLORATORY LAPAROTOMY;  Surgeon: Shann Medal, MD;  Location: [place];  Service: General;  Laterality: N/A;  ENTEROLYSIS OF ADHESIONS  . LOWER EXTREMITY ANGIOGRAM Left 06/24/12   Left stent intervention, left subclavian  . PERIPHERAL VASCULAR BALLOON ANGIOPLASTY Left 09/16/2017   Procedure: PERIPHERAL VASCULAR BALLOON ANGIOPLASTY;  Surgeon: Serafina Mitchell, MD;  Location: [place] CV LAB;  Service: Cardiovascular;  Laterality: Left;  brachial artery  . PERIPHERAL VASCULAR BALLOON ANGIOPLASTY Left 01/19/2019   Procedure: PERIPHERAL VASCULAR BALLOON ANGIOPLASTY;  Surgeon: Serafina Mitchell, MD;  Location: [place] CV LAB;  Service: Cardiovascular;  Laterality: Left;  brachial artery  . PERIPHERAL VASCULAR CATHETERIZATION N/A 04/26/2015   Procedure: Aortic Arch Angiography;  Surgeon: Serafina Mitchell, MD;  Location: [place] CV LAB;  Service: Cardiovascular;  Laterality: N/A;  . PERIPHERAL VASCULAR CATHETERIZATION  04/26/2015   Procedure: Peripheral Vascular Intervention;  Surgeon: Serafina Mitchell, MD;  Location: [place] CV LAB;  Service: Cardiovascular;;  lt illiac  . PERIPHERAL  VASCULAR CATHETERIZATION N/A 12/05/2015   Procedure: Aortic Arch Angiography;  Surgeon: Serafina Mitchell, MD;  Location: [place] CV LAB;  Service: Cardiovascular;  Laterality: N/A;  . PERIPHERAL VASCULAR CATHETERIZATION Left 12/05/2015   Procedure: Peripheral Vascular Intervention;  Surgeon: Serafina Mitchell, MD;  Location: [place] CV LAB;  Service: Cardiovascular;  Laterality: Left;  subclavian  . PERIPHERAL VASCULAR INTERVENTION Left 09/16/2017   Procedure: PERIPHERAL VASCULAR INTERVENTION;  Surgeon: Serafina Mitchell, MD;  Location: [place] CV LAB;  Service: Cardiovascular;  Laterality: Left;  subclavian artery  . PERIPHERAL VASCULAR INTERVENTION Left 01/19/2019   Procedure: PERIPHERAL VASCULAR INTERVENTION;  Surgeon: Serafina Mitchell, MD;  Location: [place] CV LAB;  Service: Cardiovascular;  Laterality: Left;  subclavian stent  . PR VEIN BYPASS GRAFT,AORTO-FEM-POP  02/26/11  . UPPER EXTREMITY ANGIOGRAPHY Left 09/16/2017   Procedure: Upper Extremity Angiography;  Surgeon: Serafina Mitchell, MD;  Location: [place] CV LAB;  Service: Cardiovascular;  Laterality: Left;    OB History   No obstetric history on file.      Home Medications    Prior to Admission medications   Medication Sig Start Date End Date Taking? Authorizing Provider  acetaminophen (  TYLENOL) 650 MG CR tablet Take 1,300 mg by mouth every 8 (eight) hours as needed for pain.    [provider]  albuterol (PROVENTIL HFA;VENTOLIN HFA) 108 (90 Base) MCG/ACT inhaler Inhale 1-2 puffs into the lungs every 6 (six) hours as needed for wheezing or shortness of breath. 02/26/18   Vanessa Kick, MD  amoxicillin-clavulanate (AUGMENTIN) 875-125 MG tablet Take 1 tablet by mouth every 12 (twelve) hours. 10/08/19   Raylene Everts, MD  Aromatic Inhalants (VICKS VAPOINHALER IN) Inhale 1 puff into the lungs daily as needed (congestion).    [provider]  Ascorbic Acid (VITAMIN C) 1000 MG tablet Take 1,000 mg by  mouth daily.    [provider]  ASPERCREME LIDOCAINE EX Apply 1 application topically daily as needed (back pain).    [provider]  aspirin 325 MG tablet Take 325 mg by mouth daily.     [provider]  atorvastatin (LIPITOR) 20 MG tablet Take 20 mg by mouth at bedtime.    [provider]  Cholecalciferol (VITAMIN D3 PO) Take by mouth.    [provider]  clopidogrel (PLAVIX) 75 MG tablet TAKE ONE TABLET BY MOUTH AT BEDTIME 06/16/19   Waynetta Sandy, MD  diphenhydrAMINE-zinc acetate (BENADRYL) cream Apply 1 application topically daily as needed for itching.    [provider]  ferrous sulfate 325 (65 FE) MG tablet Take 325 mg by mouth every other day.    [provider]  ibuprofen (ADVIL,MOTRIN) 200 MG tablet Take 200-400 mg by mouth daily as needed for moderate pain.     [provider]  loratadine (CLARITIN) 10 MG tablet Take 10 mg by mouth daily as needed for allergies.     [provider]  OVER THE COUNTER MEDICATION Take 1 tablet by mouth every other day. MK-7    [provider]  vitamin B-12 (CYANOCOBALAMIN) 1000 MCG tablet Take 1,000 mcg by mouth daily.    [provider]    Family History Family History  Problem Relation Age of Onset  . Cancer Mother        breast, colon, ovarian  . Cancer Brother 50       lung  . Hyperlipidemia Brother   . Cancer Other   . Stroke Other   . Cancer Maternal Grandmother        ovarian  . Anesthesia problems Neg Hx     Social History Social History   Tobacco Use  . Smoking status: Current Some Day Smoker    Packs/day: 0.10    Years: 45.00    Pack years: 4.50    Types: Cigarettes  . Smokeless tobacco: Never Used  . Tobacco comment: pt states that she smokes about 6 cigs per day  Vaping Use  . Vaping Use: Never used  Substance Use Topics  . Alcohol use: No    Alcohol/week: 0.0 standard drinks  . Drug use: No    Comment:  History of Cocaine and marijuana abuse: "not in many years" (04/08/11     Allergies   Codeine, Hydrocodone, Other, and Oxycodone   Review of Systems Review of Systems  See HPI Physical Exam Triage Vital Signs ED Triage Vitals  Enc Vitals Group     BP 10/08/19 1805 (!) 158/84     Pulse Rate 10/08/19 1805 63     Resp 10/08/19 1805 20     Temp 10/08/19 1805 (!) 97.4 F (36.3 C)     Temp src --  SpO2 10/08/19 1805 100 %     Weight --      Height --      Head Circumference --      Peak Flow --      Pain Score 10/08/19 1804 7     Pain Loc --      Pain Edu? --      Excl. in [place]? --    No data found.  Updated Vital Signs BP (!) 158/84 (BP Location: Right Arm)   Pulse 63   Temp (!) 97.4 F (36.3 C)   Resp 20   SpO2 100%      Physical Exam Constitutional:      General: She is not in acute distress.    Appearance: She is well-developed. She is ill-appearing.  HENT:     Head: Normocephalic and atraumatic.      Comments: No cervical adenopathy.  No palpable salivary glands    Right Ear: Tympanic membrane, ear canal and external ear normal.     Left Ear: Tympanic membrane, ear canal and external ear normal.     Nose: Nose normal.     Mouth/Throat:     Mouth: Mucous membranes are moist.     Comments: Many absent teeth.  Some atrophy of gums.  Left lower oral cavity appears normal. Eyes:     Conjunctiva/sclera: Conjunctivae normal.     Pupils: Pupils are equal, round, and reactive to light.  Cardiovascular:     Rate and Rhythm: Normal rate.  Pulmonary:     Effort: Pulmonary effort is normal. No respiratory distress.  Abdominal:     General: There is no distension.     Palpations: Abdomen is soft.  Musculoskeletal:        General: Normal range of motion.     Cervical back: Normal range of motion.  Skin:    General: Skin is warm and dry.  Neurological:     Mental Status: She is alert.      UC Treatments / Results  Labs (all labs ordered are listed, but  only abnormal results are displayed) Labs Reviewed - No data to display  EKG   Radiology No results found.  Procedures Procedures (including critical care time)  Medications Ordered in UC Medications - No data to display  Initial Impression / Assessment and Plan / UC Course  I have reviewed the triage vital signs and the nursing notes.  Pertinent labs & imaging results that were available during my care of the patient were reviewed by me and considered in my medical decision making (see chart for details).     Clearly has an infection, however, source is not identified.  We will treat her with broad-spectrum antibiotics.  Return if not improving in a couple days.  Follow-up with dentist Final Clinical Impressions(s) / UC Diagnoses   Final diagnoses:  Pain in bone of jaw     Discharge Instructions     Take antibiotic 2 times a day. Take tramadol as needed for pain.  May take with Tylenol Consider taking a probiotic while you are on the antibiotic.  This helps prevent stomach upset See dentist if this pain recurs   ED Prescriptions    Medication Sig Dispense Auth. Provider   amoxicillin-clavulanate (AUGMENTIN) 875-125 MG tablet Take 1 tablet by mouth every 12 (twelve) hours. 20 tablet Raylene Everts, MD     PDMP not reviewed this encounter.   Raylene Everts, MD 10/08/19 2124

## 2019-10-08 NOTE — ED Triage Notes (Addendum)
Pt present jaw pain on the left side, symptoms started two days ago. Pt states the pain starts from jaw to her chin. Pt feels she has an infection in her jaw.

## 2019-10-08 NOTE — Discharge Instructions (Signed)
Take antibiotic 2 times a day. Take tramadol as needed for pain.  May take with Tylenol Consider taking a probiotic while you are on the antibiotic.  This helps prevent stomach upset See dentist if this pain recurs

## 2019-10-27 ENCOUNTER — Encounter (HOSPITAL_COMMUNITY): Payer: Self-pay

## 2019-10-27 ENCOUNTER — Emergency Department (HOSPITAL_COMMUNITY): Payer: Medicare HMO

## 2019-10-27 ENCOUNTER — Emergency Department (HOSPITAL_COMMUNITY)
Admission: EM | Admit: 2019-10-27 | Discharge: 2019-10-28 | Disposition: A | Discharge status: Home / Self Care | Payer: Medicare HMO | Attending: Emergency Medicine | Admitting: Emergency Medicine

## 2019-10-27 ENCOUNTER — Other Ambulatory Visit: Payer: Self-pay

## 2019-10-27 DIAGNOSIS — R0789 Other chest pain: Secondary | ICD-10-CM | POA: Diagnosis not present

## 2019-10-27 DIAGNOSIS — Z8542 Personal history of malignant neoplasm of other parts of uterus: Secondary | ICD-10-CM | POA: Diagnosis not present

## 2019-10-27 DIAGNOSIS — M79602 Pain in left arm: Secondary | ICD-10-CM | POA: Diagnosis not present

## 2019-10-27 DIAGNOSIS — Z7982 Long term (current) use of aspirin: Secondary | ICD-10-CM | POA: Insufficient documentation

## 2019-10-27 DIAGNOSIS — J45909 Unspecified asthma, uncomplicated: Secondary | ICD-10-CM | POA: Insufficient documentation

## 2019-10-27 DIAGNOSIS — J439 Emphysema, unspecified: Secondary | ICD-10-CM | POA: Diagnosis not present

## 2019-10-27 DIAGNOSIS — F17210 Nicotine dependence, cigarettes, uncomplicated: Secondary | ICD-10-CM | POA: Diagnosis not present

## 2019-10-27 DIAGNOSIS — I10 Essential (primary) hypertension: Secondary | ICD-10-CM | POA: Diagnosis not present

## 2019-10-27 DIAGNOSIS — R079 Chest pain, unspecified: Secondary | ICD-10-CM | POA: Diagnosis not present

## 2019-10-27 LAB — BASIC METABOLIC PANEL
Anion gap: 9 (ref 5–15)
BUN: 15 mg/dL (ref 8–23)
CO2: 27 mmol/L (ref 22–32)
Calcium: 8.7 mg/dL — ABNORMAL LOW (ref 8.9–10.3)
Chloride: 101 mmol/L (ref 98–111)
Creatinine, Ser: 1.03 mg/dL — ABNORMAL HIGH (ref 0.44–1.00)
GFR, Estimated: 56 mL/min — ABNORMAL LOW (ref >60)
Glucose, Bld: 123 mg/dL — ABNORMAL HIGH (ref 70–99)
Potassium: 3.6 mmol/L (ref 3.5–5.1)
Sodium: 137 mmol/L (ref 135–145)

## 2019-10-27 LAB — CBC
HCT: 40.7 % (ref 36.0–46.0)
Hemoglobin: 13.8 g/dL (ref 12.0–15.0)
MCH: 34.3 pg — ABNORMAL HIGH (ref 26.0–34.0)
MCHC: 33.9 g/dL (ref 30.0–36.0)
MCV: 101.2 fL — ABNORMAL HIGH (ref 80.0–100.0)
Platelets: 203 10*3/uL (ref 150–400)
RBC: 4.02 MIL/uL (ref 3.87–5.11)
RDW: 12.6 % (ref 11.5–15.5)
WBC: 10.5 10*3/uL (ref 4.0–10.5)
nRBC: 0 % (ref 0.0–0.2)

## 2019-10-27 LAB — TROPONIN I (HIGH SENSITIVITY): Troponin I (High Sensitivity): 4 ng/L (ref <18)

## 2019-10-27 NOTE — ED Triage Notes (Signed)
Pt arrives to ED w/ c/o 8/10 chest pressure that radiates down her arm. Pt also endorses tingling sensation in L arm. Pt states symptoms lasted approx 5 mins and then subsided. Pt also endorses sob during this episode.

## 2019-10-28 LAB — TROPONIN I (HIGH SENSITIVITY): Troponin I (High Sensitivity): 6 ng/L (ref <18)

## 2019-10-28 NOTE — Discharge Instructions (Addendum)
You were seen in the emergency department today and were evaluated for your chest pain.  Given your symptoms we were concerned for problems with your heart.  Your blood work, EKG, chest x-ray were reassuring.  Given your history of significant vascular problems, we offered admission to the hospital for further work-up and monitoring, however you declined admission and expressed your wishes to be discharged home.   Please call your vascular surgeon first thing today and schedule follow-up appointment this week.  Additionally will refer you to a cardiologist.  You will find their contact information below.  Please call them today to schedule an appointment for emergency room follow-up.  Please return immediately to the emergency department should you develop any new chest pain, shortness of breath, numbness/ tingling, palpitations, dizziness, lightheadedness, other new severe symptoms.

## 2019-10-28 NOTE — ED Provider Notes (Signed)
[facility] EMERGENCY DEPARTMENT Provider Note   CSN: [id number] Arrival date & time: 10/27/19  2156     History Chief Complaint  Patient presents with  . Chest Pain    Kimberly [name] is a 69 y.o. female who presents following episode of chest pain radiating to her left arm, with associated numbness and tingling in her left arm and shortness of breath.  She states that the pain came on suddenly while she was walking to her car, she states that the pain lasted for approximately 5 minutes, only resolving after she sat down.  She reports she had 1 similar episode last week while she was working around her home.  She states she has never had any symptoms like this prior to this week.  At the time of my interview with this patient she is asymptomatic.  She denies chest pain, shortness of breath, numbness or tingling in her left arm, dizziness, lightheadedness at this time.  I personally reviewed this patient's medical records.  She is well-known to vascular surgery.  She has history of aortofemoral bifemoral bypass graft, subclavian artery stenting, aortic stenosis with balloon angioplasty, left brachial angioplasty. She is on clopidogrel and aspirin. She states the pain felt today is very different from prior episodes of vascular blockage. She states she has had symptoms of dizziness with each vascular blockage in the past, and did not experience dizziness today. She feels strongly that "this is not a blockage".   Duplex ultrasound of bilateral carotids 08/2019 revealed 40-59% stenosis of the left carotid; 40-59% stenosis right carotid.   HPI  HPI: A 69 year old patient with a history of peripheral artery disease, hypertension and hypercholesterolemia presents for evaluation of chest pain. Initial onset of pain was approximately 3-6 hours ago. The patient's chest pain is sharp and is worse with exertion. The patient's chest pain is middle- or left-sided, is not well-localized,  is not described as heaviness/pressure/tightness and does radiate to the arms/jaw/neck. The patient does not complain of nausea and denies diaphoresis. The patient has no history of stroke, has not smoked in the past 90 days, denies any history of treated diabetes, has no relevant family history of coronary artery disease (first degree relative at less than age 59) and does not have an elevated BMI (>=30).   Past Medical History:  Diagnosis Date  . Anemia   . Angina    March 2013  . Anxiety   . Asthma   . Blood transfusion   . Depression   . Fibromyalgia   . History of lead poisoning 1977  . Hyperlipidemia   . Hypertension   . Migraine   . Peripheral vascular disease (HCC)    claudication  right greater than left  femoral artery  . Shortness of breath   . Small bowel obstruction, partial ([place]) 04/08/11  . Stroke ([place])   . 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 Uterine cancer [facility])    Pre cervical    Patient Active Problem List   Diagnosis Date Noted  . Neck pain 08/28/2013  . Wound infection after surgery 08/28/2013  . Swelling of limb-Right neck 08/19/2013  . Weakness 06/29/2012  . Small bowel obstruction due to adhesions.  Enterolysis 04/10/2011. 05/02/2011  . Ileus ([place]) 04/08/2011  . PVD (peripheral vascular disease) ([place]) 04/08/2011  . 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 Occlusion and stenosis of carotid artery without mention of cerebral infarction 03/25/2011  . Atherosclerosis of native arteries of  the extremities with intermittent claudication 03/14/2011  . PVD (peripheral vascular disease) with claudication ([place]) 02/06/2011  . Subclavian artery stenosis ([place]) 01/28/2011  . PAP SMEAR, LGSIL, ABNORMAL 03/30/2010  . HYPERCHOLESTEROLEMIA 03/28/2010  . INTERMITTENT CLAUDICATION 03/27/2010  . LYMPHADENOPATHY 02/06/2010  . TOBACCO ABUSE 12/07/2009  . DENTAL PAIN 12/07/2009  . MENOPAUSE, SURGICAL 01/07/2005    Past Surgical History:  Procedure Laterality  Date  . abdominal angiogram    . ABDOMINAL ANGIOGRAM N/A 02/05/2011   Procedure: ABDOMINAL ANGIOGRAM;  Surgeon: Serafina Mitchell, MD;  Location: [facility] CATH LAB;  Service: Cardiovascular;  Laterality: N/A;  . ABDOMINAL HYSTERECTOMY  2008  . AORTA - BILATERAL FEMORAL ARTERY BYPASS GRAFT  02/26/2011   Procedure: AORTA BIFEMORAL BYPASS GRAFT;  Surgeon: Theotis Burrow, MD;  Location: [place];  Service: Vascular;  Laterality: N/A;  . AORTIC ARCH ANGIOGRAPHY N/A 09/16/2017   Procedure: AORTIC ARCH ANGIOGRAPHY;  Surgeon: Serafina Mitchell, MD;  Location: [place] CV LAB;  Service: Cardiovascular;  Laterality: N/A;  . AORTIC ARCH ANGIOGRAPHY N/A 01/19/2019   Procedure: AORTIC ARCH ANGIOGRAPHY;  Surgeon: Serafina Mitchell, MD;  Location: [place] CV LAB;  Service: Cardiovascular;  Laterality: N/A;  Left upper extremity angiography  . ARCH AORTOGRAM N/A 06/24/2012   Procedure: ARCH AORTOGRAM;  Surgeon: Serafina Mitchell, MD;  Location[facility] LAB;  Service: Cardiovascular;  Laterality: N/A;  . BILATERAL UPPER EXTREMITY ANGIOGRAM Left 08/12/2013   Procedure: UPPER EXTREMITY ANGIOGRAM;  Surgeon: Serafina Mitchell, MD;  Location: [facility] CATH LAB;  Service: Cardiovascular;  Laterality: Left;  . CAROTID ENDARTERECTOMY Left 06/13/2011   Left  CEA  . CAROTID ENDARTERECTOMY Right 08-13-13   CEA  . CHOLECYSTECTOMY N/A 12/08/2012   Procedure: LAPAROSCOPIC CHOLECYSTECTOMY WITH INTRAOPERATIVE CHOLANGIOGRAM;  Surgeon: Haywood Lasso, MD;  Location: [place];  Service: General;  Laterality: N/A;  . COLONOSCOPY WITH PROPOFOL N/A 06/16/2014   Procedure: COLONOSCOPY WITH PROPOFOL;  Surgeon: Garlan Fair, MD;  Location: [facility] ENDOSCOPY;  Service: Endoscopy;  Laterality: N/A;  . EMBOLECTOMY Left 04/30/2015   Procedure: EMBOLECTOMY BRACHIAL, Radial and ulnar arteries with patch angioplasty of brachial artery.;  Surgeon: Elam Dutch, MD;  Location: [facility] OR;  Service: Vascular;  Laterality: Left;  . ENDARTERECTOMY  06/13/2011   Procedure:  ENDARTERECTOMY CAROTID;  Surgeon: Serafina Mitchell, MD;  Location: [facility] OR;  Service: Vascular;  Laterality: Left;  Left carotid artery endarterectomy with vascu-guard patch angioplasty  . ENDARTERECTOMY Right 08/13/2013   Procedure: RIGHT  CAROTID ENDARTERECTOMY CAROTID WITH BOVINE PERICARDIAL PATCH ANGIOPLASTY;  Surgeon: Serafina Mitchell, MD;  Location: [place] OR;  Service: Vascular;  Laterality: Right;  . ESOPHAGOGASTRODUODENOSCOPY (EGD) WITH PROPOFOL N/A 06/16/2014   Procedure: ESOPHAGOGASTRODUODENOSCOPY (EGD) WITH PROPOFOL;  Surgeon: Garlan Fair, MD;  Location: [facility] ENDOSCOPY;  Service: Endoscopy;  Laterality: N/A;  . LAPAROTOMY  04/10/2011   Procedure: EXPLORATORY LAPAROTOMY;  Surgeon: Shann Medal, MD;  Location: [place];  Service: General;  Laterality: N/A;  ENTEROLYSIS OF ADHESIONS  . LOWER EXTREMITY ANGIOGRAM Left 06/24/12   Left stent intervention, left subclavian  . PERIPHERAL VASCULAR BALLOON ANGIOPLASTY Left 09/16/2017   Procedure: PERIPHERAL VASCULAR BALLOON ANGIOPLASTY;  Surgeon: Serafina Mitchell, MD;  Location: [place] CV LAB;  Service: Cardiovascular;  Laterality: Left;  brachial artery  . PERIPHERAL VASCULAR BALLOON ANGIOPLASTY Left 01/19/2019   Procedure: PERIPHERAL VASCULAR BALLOON ANGIOPLASTY;  Surgeon: Serafina Mitchell, MD;  Location: [place] CV LAB;  Service: Cardiovascular;  Laterality: Left;  brachial artery  .  PERIPHERAL VASCULAR CATHETERIZATION N/A 04/26/2015   Procedure: Aortic Arch Angiography;  Surgeon: Serafina Mitchell, MD;  Location: [place] CV LAB;  Service: Cardiovascular;  Laterality: N/A;  . PERIPHERAL VASCULAR CATHETERIZATION  04/26/2015   Procedure: Peripheral Vascular Intervention;  Surgeon: Serafina Mitchell, MD;  Location: [place] CV LAB;  Service: Cardiovascular;;  lt illiac  . PERIPHERAL VASCULAR CATHETERIZATION N/A 12/05/2015   Procedure: Aortic Arch Angiography;  Surgeon: Serafina Mitchell, MD;  Location: [place] CV LAB;  Service: Cardiovascular;   Laterality: N/A;  . PERIPHERAL VASCULAR CATHETERIZATION Left 12/05/2015   Procedure: Peripheral Vascular Intervention;  Surgeon: Serafina Mitchell, MD;  Location: [place] CV LAB;  Service: Cardiovascular;  Laterality: Left;  subclavian  . PERIPHERAL VASCULAR INTERVENTION Left 09/16/2017   Procedure: PERIPHERAL VASCULAR INTERVENTION;  Surgeon: Serafina Mitchell, MD;  Location: [place] CV LAB;  Service: Cardiovascular;  Laterality: Left;  subclavian artery  . PERIPHERAL VASCULAR INTERVENTION Left 01/19/2019   Procedure: PERIPHERAL VASCULAR INTERVENTION;  Surgeon: Serafina Mitchell, MD;  Location: [place] CV LAB;  Service: Cardiovascular;  Laterality: Left;  subclavian stent  . PR VEIN BYPASS GRAFT,AORTO-FEM-POP  02/26/11  . UPPER EXTREMITY ANGIOGRAPHY Left 09/16/2017   Procedure: Upper Extremity Angiography;  Surgeon: Serafina Mitchell, MD;  Location: [place] CV LAB;  Service: Cardiovascular;  Laterality: Left;     OB History   No obstetric history on file.     Family History  Problem Relation Age of Onset  . Cancer Mother        breast, colon, ovarian  . Cancer Brother 50       lung  . Hyperlipidemia Brother   . Cancer Other   . Stroke Other   . Cancer Maternal Grandmother        ovarian  . Anesthesia problems Neg Hx     Social History   Tobacco Use  . Smoking status: Current Some Day Smoker    Packs/day: 0.10    Years: 45.00    Pack years: 4.50    Types: Cigarettes  . Smokeless tobacco: Never Used  . Tobacco comment: pt states that she smokes about 6 cigs per day  Vaping Use  . Vaping Use: Never used  Substance Use Topics  . Alcohol use: No    Alcohol/week: 0.0 standard drinks  . Drug use: No    Comment: History of Cocaine and marijuana abuse: "not in many years" (04/08/11    Home Medications Prior to Admission medications   Medication Sig Start Date End Date Taking? Authorizing Provider  acetaminophen (TYLENOL) 650 MG CR tablet Take 1,300 mg by mouth every 8  (eight) hours as needed for pain.    [provider]  albuterol (PROVENTIL HFA;VENTOLIN HFA) 108 (90 Base) MCG/ACT inhaler Inhale 1-2 puffs into the lungs every 6 (six) hours as needed for wheezing or shortness of breath. 02/26/18   Vanessa Kick, MD  amoxicillin-clavulanate (AUGMENTIN) 875-125 MG tablet Take 1 tablet by mouth every 12 (twelve) hours. 10/08/19   Raylene Everts, MD  Aromatic Inhalants (VICKS VAPOINHALER IN) Inhale 1 puff into the lungs daily as needed (congestion).    [provider]  Ascorbic Acid (VITAMIN C) 1000 MG tablet Take 1,000 mg by mouth daily.    [provider]  ASPERCREME LIDOCAINE EX Apply 1 application topically daily as needed (back pain).    [provider]  aspirin 325 MG tablet Take 325 mg by mouth daily.  [provider]  atorvastatin (LIPITOR) 20 MG tablet Take 20 mg by mouth at bedtime.    [provider]  Cholecalciferol (VITAMIN D3 PO) Take by mouth.    [provider]  clopidogrel (PLAVIX) 75 MG tablet TAKE ONE TABLET BY MOUTH AT BEDTIME 06/16/19   Waynetta Sandy, MD  diphenhydrAMINE-zinc acetate (BENADRYL) cream Apply 1 application topically daily as needed for itching.    [provider]  ferrous sulfate 325 (65 FE) MG tablet Take 325 mg by mouth every other day.    [provider]  ibuprofen (ADVIL,MOTRIN) 200 MG tablet Take 200-400 mg by mouth daily as needed for moderate pain.     [provider]  loratadine (CLARITIN) 10 MG tablet Take 10 mg by mouth daily as needed for allergies.     [provider]  OVER THE COUNTER MEDICATION Take 1 tablet by mouth every other day. MK-7    [provider]  vitamin B-12 (CYANOCOBALAMIN) 1000 MCG tablet Take 1,000 mcg by mouth daily.    [provider]    Allergies    Codeine, Hydrocodone, Other, and Oxycodone  Review of Systems   Review of Systems  Constitutional: Negative for  chills, diaphoresis, fatigue and fever.  Respiratory: Positive for shortness of breath.   Cardiovascular: Positive for chest pain. Negative for palpitations and leg swelling.  Gastrointestinal: Negative for abdominal pain, nausea and vomiting.  Musculoskeletal: Negative.   Skin: Negative.   Neurological: Negative for dizziness, syncope, facial asymmetry, weakness and light-headedness.  Hematological: Negative.    Physical Exam Updated Vital Signs BP 128/76   Pulse (!) 58   Temp 98.6 F (37 C) (Oral)   Resp 16   SpO2 98%   Physical Exam Vitals and nursing note reviewed.  HENT:     Head: Normocephalic and atraumatic.     Mouth/Throat:     Mouth: Mucous membranes are moist.     Pharynx: No oropharyngeal exudate or posterior oropharyngeal erythema.  Eyes:     General:        Right eye: No discharge.        Left eye: No discharge.     Conjunctiva/sclera: Conjunctivae normal.     Pupils: Pupils are equal, round, and reactive to light.  Cardiovascular:     Rate and Rhythm: Normal rate and regular rhythm.     Pulses: Normal pulses.     Heart sounds: Murmur heard.  Systolic murmur is present with a grade of 2/6.      Comments: Pulses are symmetric Pulmonary:     Effort: Pulmonary effort is normal. No respiratory distress.     Breath sounds: Normal breath sounds. No wheezing, rhonchi or rales.  Chest:     Chest wall: No mass, tenderness, crepitus or edema.  Abdominal:     General: There is no distension.     Palpations: Abdomen is soft.     Tenderness: There is no abdominal tenderness.  Musculoskeletal:        General: No deformity.     Cervical back: Neck supple.     Right lower leg: No edema.     Left lower leg: No edema.  Skin:    General: Skin is warm and dry.     Capillary Refill: Capillary refill takes less than 2 seconds.  Neurological:     General: No focal deficit present.     Mental Status: She is alert and oriented to person, place, and time. Mental status  is  at baseline.     Sensory: Sensation is intact.     Motor: Motor function is intact.  Psychiatric:        Mood and Affect: Mood normal.     ED Results / Procedures / Treatments   Labs (all labs ordered are listed, but only abnormal results are displayed) Labs Reviewed  BASIC METABOLIC PANEL - Abnormal; Notable for the following components:      Result Value   Glucose, Bld 123 (*)    Creatinine, Ser 1.03 (*)    Calcium 8.7 (*)    GFR, Estimated 56 (*)    All other components within normal limits  CBC - Abnormal; Notable for the following components:   MCV 101.2 (*)    MCH 34.3 (*)    All other components within normal limits  TROPONIN I (HIGH SENSITIVITY)  TROPONIN I (HIGH SENSITIVITY)    EKG EKG Interpretation  Date/Time:  Thursday October 28 2019 02:26:06 EDT Ventricular Rate:  57 PR Interval:  144 QRS Duration: 96 QT Interval:  430 QTC Calculation: 418 R Axis:   74 Text Interpretation: Sinus bradycardia Incomplete right bundle branch block Cannot rule out Anterior infarct , age undetermined Abnormal ECG Confirmed by Thayer Jew [phone]) on 10/28/2019 2:31:38 AM   Radiology DG Chest 2 View  Result Date: 10/27/2019 CLINICAL DATA:  Chest pain radiating to the left arm. Shortness of breath. Current smoker. EXAM: CHEST - 2 VIEW COMPARISON:  02/26/2018 FINDINGS: Emphysematous changes in the lungs. Scattered fibrosis and peribronchial thickening consistent with chronic bronchitis. No airspace disease or consolidation in the lungs. No pleural effusions. No pneumothorax. Normal heart size. Mediastinal contours appear intact. Calcification of the aorta. Vascular stent in the mediastinum. Surgical clips in the base of the neck. IMPRESSION: Emphysematous and chronic bronchitic changes in the lungs. No evidence of active pulmonary disease. Electronically Signed   By: Lucienne Capers M.D.   On: 10/27/2019 22:27    Procedures Procedures (including critical care  time)  Medications Ordered in ED Medications - No data to display  ED Course  I have reviewed the triage vital signs and the nursing notes.  Pertinent labs & imaging results that were available during my care of the patient were reviewed by me and considered in my medical decision making (see chart for details).    MDM Rules/Calculators/A&P HEAR Score: 6                       Patient with extensive vascular history, presenting with left chest pain radiating to her left arm with associated left arm numbness, shortness of breath.  Episode resolved spontaneously after 5 minutes.  Concern for ACS versus vascular etiology in this patient with exertional chest pain and extensive vasculopathic history.   EKG with sinus bradycardia, incomplete right bundle branch block, possible anterior infarct, age undetermined.   Chest x-ray revealed emphysematous and chronic bronchitic changes in the lungs, but no evidence of active cardiopulmonary disease.  CBC unremarkable, BMP with mild increase in creatinine 1.03, previously 0.9.  Initial troponin was 4, second troponin was 6.  Heart score is 6 recommendation observation versus close outpatient follow-up.  Troponin profile is very reassuring.  Physical exam without any acute objective finding. Active ACS unlikely at this time. Patient's history suggestive of stable angina.  Shared decision making with this patient regarding disposition.  Offered and encouraged admission to the hospital multiple times, however patient is adamant that she wants to go  home.  "Just let me go home and follow-up with my vascular doctor."  She states "Just get me out of here.  If I leave and have a heart attack at home and I die, then I die. If I make it back to the hospital, then I make it back to the hospital."  Patient's vital signs have remained stable throughout her stay in the emergency department.  Will discharge with close follow-up to cardiology, vascular surgery.  We  again offered admission to the hospital for further evaluation of possible vascular etiology of patient symptoms; she declined and adamantly requested to be discharged home.  Will discharge patient at this time.  Strict return precautions were given.  Final Clinical Impression(s) / ED Diagnoses Final diagnoses:  Other chest pain    Rx / DC Orders ED Discharge Orders    None       Aura Dials 10/28/19 0606    Merryl Hacker, MD 10/31/19 [phone]

## 2019-11-15 ENCOUNTER — Ambulatory Visit (HOSPITAL_COMMUNITY)
Admission: RE | Admit: 2019-11-15 | Discharge: 2019-11-15 | Disposition: A | Discharge status: Home / Self Care | Payer: Medicare HMO | Source: Ambulatory Visit | Attending: Physician Assistant | Admitting: Physician Assistant

## 2019-11-15 ENCOUNTER — Encounter: Payer: Self-pay | Admitting: Physician Assistant

## 2019-11-15 ENCOUNTER — Other Ambulatory Visit: Payer: Self-pay

## 2019-11-15 ENCOUNTER — Ambulatory Visit (INDEPENDENT_AMBULATORY_CARE_PROVIDER_SITE_OTHER)
Admission: RE | Admit: 2019-11-15 | Discharge: 2019-11-15 | Disposition: A | Discharge status: Home / Self Care | Payer: Medicare HMO | Source: Ambulatory Visit | Attending: Physician Assistant | Admitting: Physician Assistant

## 2019-11-15 ENCOUNTER — Ambulatory Visit: Payer: Medicare HMO | Admitting: Physician Assistant

## 2019-11-15 VITALS — BP 141/68 | HR 61 | Temp 98.4°F | Resp 20 | Ht 64.0 in | Wt 117.8 lb

## 2019-11-15 DIAGNOSIS — I779 Disorder of arteries and arterioles, unspecified: Secondary | ICD-10-CM

## 2019-11-15 NOTE — Progress Notes (Signed)
History of Present Illness:  Patient is a 69 y.o. year old female who presents for evaluation of PAD.   01/19/2019: left subclavian artery stent placement and left brachial artery balloon angioplasty secondary to dizziness and left arm pain. 04/26/2015: right brachial artery embolectomy 08/12/2013: right brachial artery drug coated balloon angioplasty 06/14/2012: Left subclavian artery stent placement 08/13/2013: left CEA 02/26/2011: aortobifemoral bypass graft  She was last seen 08/16/19 for f/u CEA duplex.  Stent in subclavians were patent and carotid stenosis was stable at < 59%.    She is here today for f/u s/p aortobifemoral bypass graft 02/26/2011 By Dr. Trula Slade.  She has symptoms of anterior thigh pain, but not symptoms of true claudication.  She denise rest pain and non healing wounds.    She was seen in the ED for complainants of left chest pain and numbness in her left arm.     EKG Text Interpretation:      Sinus bradycardia Incomplete right bundle branch block Cannot rule out Anterior infarct , age undetermined Abnormal ECG.  She was supposed to f/u with cardiology and has not.  She continues to have a filling of fullness that radiates into her left arm and causes her arm to go numb.       Past Medical History:  Diagnosis Date  . Anemia   . Angina    March 2013  . Anxiety   . Asthma   . Blood transfusion   . Depression   . Fibromyalgia   . History of lead poisoning 1977  . Hyperlipidemia   . Hypertension   . Migraine   . Peripheral vascular disease (HCC)    claudication  right greater than left  femoral artery  . Shortness of breath   . Small bowel obstruction, partial ([place]) 04/08/11  . Stroke ([place])   . 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 Uterine cancer [facility])    Pre cervical    Past Surgical History:  Procedure Laterality Date  . abdominal angiogram    . ABDOMINAL ANGIOGRAM N/A 02/05/2011   Procedure: ABDOMINAL ANGIOGRAM;  Surgeon: Serafina Mitchell, MD;   Location: [facility] CATH LAB;  Service: Cardiovascular;  Laterality: N/A;  . ABDOMINAL HYSTERECTOMY  2008  . AORTA - BILATERAL FEMORAL ARTERY BYPASS GRAFT  02/26/2011   Procedure: AORTA BIFEMORAL BYPASS GRAFT;  Surgeon: Theotis Burrow, MD;  Location: [place];  Service: Vascular;  Laterality: N/A;  . AORTIC ARCH ANGIOGRAPHY N/A 09/16/2017   Procedure: AORTIC ARCH ANGIOGRAPHY;  Surgeon: Serafina Mitchell, MD;  Location: [place] CV LAB;  Service: Cardiovascular;  Laterality: N/A;  . AORTIC ARCH ANGIOGRAPHY N/A 01/19/2019   Procedure: AORTIC ARCH ANGIOGRAPHY;  Surgeon: Serafina Mitchell, MD;  Location: [place] CV LAB;  Service: Cardiovascular;  Laterality: N/A;  Left upper extremity angiography  . ARCH AORTOGRAM N/A 06/24/2012   Procedure: ARCH AORTOGRAM;  Surgeon: Serafina Mitchell, MD;  Location: [facility] CATH LAB;  Service: Cardiovascular;  Laterality: N/A;  . BILATERAL UPPER EXTREMITY ANGIOGRAM Left 08/12/2013   Procedure: UPPER EXTREMITY ANGIOGRAM;  Surgeon: Serafina Mitchell, MD;  Location: [facility] CATH LAB;  Service: Cardiovascular;  Laterality: Left;  . CAROTID ENDARTERECTOMY Left 06/13/2011   Left  CEA  . CAROTID ENDARTERECTOMY Right 08-13-13   CEA  . CHOLECYSTECTOMY N/A 12/08/2012   Procedure: LAPAROSCOPIC CHOLECYSTECTOMY WITH INTRAOPERATIVE CHOLANGIOGRAM;  Surgeon: Haywood Lasso, MD;  Location: [place];  Service: General;  Laterality: N/A;  .  COLONOSCOPY WITH PROPOFOL N/A 06/16/2014   Procedure: COLONOSCOPY WITH PROPOFOL;  Surgeon: Garlan Fair, MD;  Location: [facility] ENDOSCOPY;  Service: Endoscopy;  Laterality: N/A;  . EMBOLECTOMY Left 04/30/2015   Procedure: EMBOLECTOMY BRACHIAL, Radial and ulnar arteries with patch angioplasty of brachial artery.;  Surgeon: Elam Dutch, MD;  Location: [facility] OR;  Service: Vascular;  Laterality: Left;  . ENDARTERECTOMY  06/13/2011   Procedure: ENDARTERECTOMY CAROTID;  Surgeon: Serafina Mitchell, MD;  Location: [facility] OR;  Service: Vascular;  Laterality: Left;  Left carotid artery  endarterectomy with vascu-guard patch angioplasty  . ENDARTERECTOMY Right 08/13/2013   Procedure: RIGHT  CAROTID ENDARTERECTOMY CAROTID WITH BOVINE PERICARDIAL PATCH ANGIOPLASTY;  Surgeon: Serafina Mitchell, MD;  Location: [place] OR;  Service: Vascular;  Laterality: Right;  . ESOPHAGOGASTRODUODENOSCOPY (EGD) WITH PROPOFOL N/A 06/16/2014   Procedure: ESOPHAGOGASTRODUODENOSCOPY (EGD) WITH PROPOFOL;  Surgeon: Garlan Fair, MD;  Location: [facility] ENDOSCOPY;  Service: Endoscopy;  Laterality: N/A;  . LAPAROTOMY  04/10/2011   Procedure: EXPLORATORY LAPAROTOMY;  Surgeon: Shann Medal, MD;  Location: [place];  Service: General;  Laterality: N/A;  ENTEROLYSIS OF ADHESIONS  . LOWER EXTREMITY ANGIOGRAM Left 06/24/12   Left stent intervention, left subclavian  . PERIPHERAL VASCULAR BALLOON ANGIOPLASTY Left 09/16/2017   Procedure: PERIPHERAL VASCULAR BALLOON ANGIOPLASTY;  Surgeon: Serafina Mitchell, MD;  Location: [place] CV LAB;  Service: Cardiovascular;  Laterality: Left;  brachial artery  . PERIPHERAL VASCULAR BALLOON ANGIOPLASTY Left 01/19/2019   Procedure: PERIPHERAL VASCULAR BALLOON ANGIOPLASTY;  Surgeon: Serafina Mitchell, MD;  Location: [place] CV LAB;  Service: Cardiovascular;  Laterality: Left;  brachial artery  . PERIPHERAL VASCULAR CATHETERIZATION N/A 04/26/2015   Procedure: Aortic Arch Angiography;  Surgeon: Serafina Mitchell, MD;  Location: [place] CV LAB;  Service: Cardiovascular;  Laterality: N/A;  . PERIPHERAL VASCULAR CATHETERIZATION  04/26/2015   Procedure: Peripheral Vascular Intervention;  Surgeon: Serafina Mitchell, MD;  Location: [place] CV LAB;  Service: Cardiovascular;;  lt illiac  . PERIPHERAL VASCULAR CATHETERIZATION N/A 12/05/2015   Procedure: Aortic Arch Angiography;  Surgeon: Serafina Mitchell, MD;  Location: [place] CV LAB;  Service: Cardiovascular;  Laterality: N/A;  . PERIPHERAL VASCULAR CATHETERIZATION Left 12/05/2015   Procedure: Peripheral Vascular Intervention;  Surgeon: Serafina Mitchell, MD;  Location: [place] CV LAB;  Service: Cardiovascular;  Laterality: Left;  subclavian  . PERIPHERAL VASCULAR INTERVENTION Left 09/16/2017   Procedure: PERIPHERAL VASCULAR INTERVENTION;  Surgeon: Serafina Mitchell, MD;  Location: [place] CV LAB;  Service: Cardiovascular;  Laterality: Left;  subclavian artery  . PERIPHERAL VASCULAR INTERVENTION Left 01/19/2019   Procedure: PERIPHERAL VASCULAR INTERVENTION;  Surgeon: Serafina Mitchell, MD;  Location: [place] CV LAB;  Service: Cardiovascular;  Laterality: Left;  subclavian stent  . PR VEIN BYPASS GRAFT,AORTO-FEM-POP  02/26/11  . UPPER EXTREMITY ANGIOGRAPHY Left 09/16/2017   Procedure: Upper Extremity Angiography;  Surgeon: Serafina Mitchell, MD;  Location: [place] CV LAB;  Service: Cardiovascular;  Laterality: Left;    ROS:   General:  No weight loss, Fever, chills  HEENT: No recent headaches, no nasal bleeding, no visual changes, no sore throat  Neurologic: No dizziness, blackouts, seizures. No recent symptoms of stroke or mini- stroke. No recent episodes of slurred speech, or temporary blindness.  Cardiac: No recent episodes of chest pain/pressure, no shortness of breath at rest.  No shortness of breath with exertion.  Denies history of atrial fibrillation or irregular heartbeat  Vascular: No history of rest  pain in feet.  No history of claudication.  No history of non-healing ulcer, No history of DVT   Pulmonary: No home oxygen, no productive cough, no hemoptysis,  No asthma or wheezing  Musculoskeletal:  [ ]  Arthritis, [ ]  Low back pain,  [ ]  Joint pain  Hematologic:No history of hypercoagulable state.  No history of easy bleeding.  No history of anemia  Gastrointestinal: No hematochezia or melena,  No gastroesophageal reflux, no trouble swallowing  Urinary: [ ]  chronic Kidney disease, [ ]  on HD - [ ]  MWF or [ ]  TTHS, [ ]  Burning with urination, [ ]  Frequent urination, [ ]  Difficulty urinating;   Skin: No  rashes  Psychological: No history of anxiety,  No history of depression  Social History Social History   Tobacco Use  . Smoking status: Current Some Day Smoker    Packs/day: 0.10    Years: 45.00    Pack years: 4.50    Types: Cigarettes  . Smokeless tobacco: Never Used  . Tobacco comment: pt states that she smokes about 6 cigs per day  Vaping Use  . Vaping Use: Never used  Substance Use Topics  . Alcohol use: No    Alcohol/week: 0.0 standard drinks  . Drug use: No    Comment: History of Cocaine and marijuana abuse: "not in many years" (04/08/11    Family History Family History  Problem Relation Age of Onset  . Cancer Mother        breast, colon, ovarian  . Cancer Brother 50       lung  . Hyperlipidemia Brother   . Cancer Other   . Stroke Other   . Cancer Maternal Grandmother        ovarian  . Anesthesia problems Neg Hx     Allergies  Allergies  Allergen Reactions  . Codeine Nausea And Vomiting    Dizziness, headache, has passed out  . Hydrocodone Nausea And Vomiting and Other (See Comments)    Headache and dizziness  . Other Shortness Of Breath    Allergy to cologne/perfume base  . Oxycodone Nausea And Vomiting and Other (See Comments)    Dizziness      Current Outpatient Medications  Medication Sig Dispense Refill  . acetaminophen (TYLENOL) 650 MG CR tablet Take 1,300 mg by mouth every 8 (eight) hours as needed for pain.    Marland Kitchen albuterol (PROVENTIL HFA;VENTOLIN HFA) 108 (90 Base) MCG/ACT inhaler Inhale 1-2 puffs into the lungs every 6 (six) hours as needed for wheezing or shortness of breath. 1 Inhaler 0  . amoxicillin-clavulanate (AUGMENTIN) 875-125 MG tablet Take 1 tablet by mouth every 12 (twelve) hours. 20 tablet 0  . Aromatic Inhalants (VICKS VAPOINHALER IN) Inhale 1 puff into the lungs daily as needed (congestion).    . Ascorbic Acid (VITAMIN C) 1000 MG tablet Take 1,000 mg by mouth daily.    . ASPERCREME LIDOCAINE EX Apply 1 application topically daily  as needed (back pain).    Marland Kitchen aspirin 325 MG tablet Take 325 mg by mouth daily.     Marland Kitchen atorvastatin (LIPITOR) 20 MG tablet Take 20 mg by mouth at bedtime.    . Cholecalciferol (VITAMIN D3 PO) Take by mouth.    . clopidogrel (PLAVIX) 75 MG tablet TAKE ONE TABLET BY MOUTH AT BEDTIME 90 tablet 2  . diphenhydrAMINE-zinc acetate (BENADRYL) cream Apply 1 application topically daily as needed for itching.    . ferrous sulfate 325 (65 FE) MG tablet Take 325 mg  by mouth every other day.    . ibuprofen (ADVIL,MOTRIN) 200 MG tablet Take 200-400 mg by mouth daily as needed for moderate pain.     Marland Kitchen loratadine (CLARITIN) 10 MG tablet Take 10 mg by mouth daily as needed for allergies.     Marland Kitchen OVER THE COUNTER MEDICATION Take 1 tablet by mouth every other day. MK-7    . vitamin B-12 (CYANOCOBALAMIN) 1000 MCG tablet Take 1,000 mcg by mouth daily.     No current facility-administered medications for this visit.    Physical Examination  Vitals:   11/15/19 0913  BP: (!) 143/66  Pulse: 60  Resp: 20  Temp: 98.4 F (36.9 C)  TempSrc: Temporal  SpO2: 100%  Weight: 117 lb 12.8 oz (53.4 kg)  Height: 5\' 4"  (1.626 m)    Body mass index is 20.22 kg/m.  General:  Alert and oriented, no acute distress HEENT: Normal Neck: No bruit or JVD Pulmonary: left UL wheezing right clear to auscultation  Cardiac: Regular Rate and Rhythm with  murmur Abdomen: Soft, non-tender, non-distended, no mass, no scars Skin: No rash Extremity Pulses:  2+ radial, brachial, femoral, unable to exam LEs patient did not want to remove her shoes and socks. Musculoskeletal: No deformity or edema  Neurologic: Upper and lower extremity motor 5/5 and symmetric  DATA:    ABI Findings:  +---------+------------------+-----+---------+--------+  Right  Rt Pressure (mmHg)IndexWaveform Comment   +---------+------------------+-----+---------+--------+  Brachial 167                       +---------+------------------+-----+---------+--------+  PTA   134        0.80 biphasic       +---------+------------------+-----+---------+--------+  DP    152        0.91 triphasic      +---------+------------------+-----+---------+--------+  Great Toe105        0.63            +---------+------------------+-----+---------+--------+   +---------+------------------+-----+--------+-------+  Left   Lt Pressure (mmHg)IndexWaveformComment  +---------+------------------+-----+--------+-------+  Brachial 152                     +---------+------------------+-----+--------+-------+  PTA   102        0.61 biphasic      +---------+------------------+-----+--------+-------+  DP    99        0.59 biphasic      +---------+------------------+-----+--------+-------+  Great Toe89        0.53           +---------+------------------+-----+--------+-------+   +-------+-----------+-----------+------------+------------+  ABI/TBIToday's ABIToday's TBIPrevious ABIPrevious TBI  +-------+-----------+-----------+------------+------------+  Right 0.91    0.63    1.02    0.66      +-------+-----------+-----------+------------+------------+  Left  0.61    0.53    0.73    0.53      +-------+-----------+-----------+------------+------------+     Summary:  Right: Resting right ankle-brachial index indicates mild right lower  extremity arterial disease. The right toe-brachial index is abnormal.   Left: Resting left ankle-brachial index indicates moderate left lower  extremity arterial disease. The left toe-brachial index is abnormal.   Graft #1:  +-----------------------------+--------+--------+--------+--------+  Aortobifemoral (to right CFA)PSV cm/sStenosisWaveformComments   +-----------------------------+--------+--------+--------+--------+  Inflow            106       biphasic      +-----------------------------+--------+--------+--------+--------+  Prox Anastomosis       98       biphasic      +-----------------------------+--------+--------+--------+--------+  Proximal Graft        93       biphasic      +-----------------------------+--------+--------+--------+--------+  Mid Graft          112       biphasic      +-----------------------------+--------+--------+--------+--------+  Distal Graft         87       biphasic      +-----------------------------+--------+--------+--------+--------+  Distal Anastomosis      205       biphasic      +-----------------------------+--------+--------+--------+--------+  Outflow           216       biphasic      +-----------------------------+--------+--------+--------+--------+   Patent   Graft #1:  +----------------------------+--------+--------+--------+--------+  Aortobifemoral (to left CFA)PSV cm/sStenosisWaveformComments  +----------------------------+--------+--------+--------+--------+  Inflow                             +----------------------------+--------+--------+--------+--------+  Proximal Anastomosis                      +----------------------------+--------+--------+--------+--------+  Proximal Graft                         +----------------------------+--------+--------+--------+--------+  Mid Graft                            +----------------------------+--------+--------+--------+--------+  Distal Graft        104       biphasic       +----------------------------+--------+--------+--------+--------+  Distal Anastamosis     189       biphasic      +----------------------------+--------+--------+--------+--------+  Outflow           147       biphasic      +----------------------------+--------+--------+--------+--------+   Patent   Summary:   Abdominal Aorta: The aortobifemoral bypass graft is patent without  evidence of significant stenosis.     ASSESSMENT:   Aortobifemoral bypass graft  She is here today for f/u s/p aortobifemoral bypass graft 02/26/2011 By Dr. Trula Slade.  She has symptoms of anterior thigh pain, but not symptoms of true claudication.  She denise rest pain and non healing wounds.    He aorta duplex shows a patent graft with biphasic flow in B LE without true claudication symptoms.  She has anterior thigh pain at times if she walks far, but she thinks it's her lumbar.     Chest pain/tightness with left arm numbness.  She has palpable radial and brachial on the left UE withy patent stent seen on duplex 08/16/19 visit.  She was seen in the ED 10/28/19 with these same symptoms and advised to f/u with Cardiology.  I could not find an appt.  Our office will work on a Cardiology appt.     PLAN:   She will f/u with [place] in 6 months for repeat carotid duplex and subclavian stents.  She will f/u in 1 year for aorto/illiac/ivc duplex and ABI's.  If she develops symptoms related to claudication, rest pain or non healing wounds she will call sooner.  She should try to increase her walking and start a walking program as tolerates.  I also suggested she stop smoking.  Continue ASA, Plavix and Statin daily.    Roxy Horseman PA-C [facility] Office: [phone]  MD in clinic [place]

## 2019-11-17 ENCOUNTER — Encounter: Payer: Self-pay | Admitting: Cardiology

## 2019-11-17 ENCOUNTER — Other Ambulatory Visit (HOSPITAL_COMMUNITY)
Admission: RE | Admit: 2019-11-17 | Discharge: 2019-11-17 | Disposition: A | Discharge status: Home / Self Care | Payer: Medicare HMO | Source: Ambulatory Visit | Attending: Cardiovascular Disease | Admitting: Cardiovascular Disease

## 2019-11-17 ENCOUNTER — Ambulatory Visit: Payer: Medicare HMO | Admitting: Cardiology

## 2019-11-17 ENCOUNTER — Other Ambulatory Visit: Payer: Self-pay | Admitting: *Deleted

## 2019-11-17 VITALS — BP 132/64 | HR 73 | Ht 64.0 in | Wt 117.6 lb

## 2019-11-17 DIAGNOSIS — I2510 Atherosclerotic heart disease of native coronary artery without angina pectoris: Secondary | ICD-10-CM | POA: Diagnosis not present

## 2019-11-17 DIAGNOSIS — Z90710 Acquired absence of both cervix and uterus: Secondary | ICD-10-CM | POA: Diagnosis not present

## 2019-11-17 DIAGNOSIS — Z7982 Long term (current) use of aspirin: Secondary | ICD-10-CM | POA: Diagnosis not present

## 2019-11-17 DIAGNOSIS — I200 Unstable angina: Secondary | ICD-10-CM | POA: Diagnosis not present

## 2019-11-17 DIAGNOSIS — M797 Fibromyalgia: Secondary | ICD-10-CM | POA: Diagnosis present

## 2019-11-17 DIAGNOSIS — I2584 Coronary atherosclerosis due to calcified coronary lesion: Secondary | ICD-10-CM | POA: Diagnosis present

## 2019-11-17 DIAGNOSIS — Z8542 Personal history of malignant neoplasm of other parts of uterus: Secondary | ICD-10-CM | POA: Diagnosis not present

## 2019-11-17 DIAGNOSIS — R072 Precordial pain: Secondary | ICD-10-CM

## 2019-11-17 DIAGNOSIS — I4510 Unspecified right bundle-branch block: Secondary | ICD-10-CM | POA: Diagnosis present

## 2019-11-17 DIAGNOSIS — Z8673 Personal history of transient ischemic attack (TIA), and cerebral infarction without residual deficits: Secondary | ICD-10-CM | POA: Diagnosis not present

## 2019-11-17 DIAGNOSIS — I2583 Coronary atherosclerosis due to lipid rich plaque: Secondary | ICD-10-CM | POA: Diagnosis not present

## 2019-11-17 DIAGNOSIS — E785 Hyperlipidemia, unspecified: Secondary | ICD-10-CM | POA: Diagnosis present

## 2019-11-17 DIAGNOSIS — R079 Chest pain, unspecified: Secondary | ICD-10-CM | POA: Diagnosis not present

## 2019-11-17 DIAGNOSIS — Z885 Allergy status to narcotic agent status: Secondary | ICD-10-CM | POA: Diagnosis not present

## 2019-11-17 DIAGNOSIS — Z888 Allergy status to other drugs, medicaments and biological substances status: Secondary | ICD-10-CM | POA: Diagnosis not present

## 2019-11-17 DIAGNOSIS — Z7902 Long term (current) use of antithrombotics/antiplatelets: Secondary | ICD-10-CM | POA: Diagnosis not present

## 2019-11-17 DIAGNOSIS — I358 Other nonrheumatic aortic valve disorders: Secondary | ICD-10-CM | POA: Diagnosis present

## 2019-11-17 DIAGNOSIS — F17200 Nicotine dependence, unspecified, uncomplicated: Secondary | ICD-10-CM | POA: Diagnosis not present

## 2019-11-17 DIAGNOSIS — J439 Emphysema, unspecified: Secondary | ICD-10-CM | POA: Diagnosis present

## 2019-11-17 DIAGNOSIS — E7800 Pure hypercholesterolemia, unspecified: Secondary | ICD-10-CM | POA: Diagnosis present

## 2019-11-17 DIAGNOSIS — I361 Nonrheumatic tricuspid (valve) insufficiency: Secondary | ICD-10-CM | POA: Diagnosis not present

## 2019-11-17 DIAGNOSIS — Z01812 Encounter for preprocedural laboratory examination: Secondary | ICD-10-CM | POA: Insufficient documentation

## 2019-11-17 DIAGNOSIS — I25110 Atherosclerotic heart disease of native coronary artery with unstable angina pectoris: Secondary | ICD-10-CM | POA: Diagnosis present

## 2019-11-17 DIAGNOSIS — Z9049 Acquired absence of other specified parts of digestive tract: Secondary | ICD-10-CM | POA: Diagnosis not present

## 2019-11-17 DIAGNOSIS — Z823 Family history of stroke: Secondary | ICD-10-CM | POA: Diagnosis not present

## 2019-11-17 DIAGNOSIS — Z20822 Contact with and (suspected) exposure to covid-19: Secondary | ICD-10-CM | POA: Diagnosis present

## 2019-11-17 DIAGNOSIS — Z79899 Other long term (current) drug therapy: Secondary | ICD-10-CM | POA: Diagnosis not present

## 2019-11-17 DIAGNOSIS — F17210 Nicotine dependence, cigarettes, uncomplicated: Secondary | ICD-10-CM | POA: Diagnosis present

## 2019-11-17 DIAGNOSIS — Z8041 Family history of malignant neoplasm of ovary: Secondary | ICD-10-CM | POA: Diagnosis not present

## 2019-11-17 DIAGNOSIS — Z801 Family history of malignant neoplasm of trachea, bronchus and lung: Secondary | ICD-10-CM | POA: Diagnosis not present

## 2019-11-17 DIAGNOSIS — I739 Peripheral vascular disease, unspecified: Secondary | ICD-10-CM | POA: Diagnosis present

## 2019-11-17 DIAGNOSIS — I10 Essential (primary) hypertension: Secondary | ICD-10-CM | POA: Diagnosis present

## 2019-11-17 LAB — SARS CORONAVIRUS 2 (TAT 6-24 HRS): SARS Coronavirus 2: NEGATIVE

## 2019-11-17 MED ORDER — SODIUM CHLORIDE 0.9% FLUSH: 3.0000 mL | Freq: Two times a day (BID) | INTRAVENOUS | Status: DC

## 2019-11-17 NOTE — H&P (View-Only) (Signed)
Referring-Kimberly Skakle DO Reason for referral-chest pain  HPI: 69 year old female for evaluation of chest pain at request of Sueanne Margarita DO.  Nuclear study January 2013 normal with ejection fraction 79%.  Patient does have a history of peripheral vascular disease and has had previous left subclavian artery stent and left brachial artery angioplasty, right brachial artery embolectomy, previous left carotid endarterectomy and aortobifemoral bypass graft.  Most recent carotid Dopplers August 2021 showed 40 to 59% bilateral stenosis.  Dopplers November 2021 showed patent aortobifemoral bypass graft.  ABIs mild on the right and moderate on the left.  Seen in the emergency room October 27, 2019 with chest pain.  Hemoglobin 13.8 and creatinine 1.03.  Troponins normal.  Chest x-ray showed emphysema.  No prior cardiac history.  Patient states that for the past 2 weeks she has had occasional chest pain.  Her first episode occurred while she was walking her dog.  She described a pressure in her chest that radiated to her shoulder and left arm.  There was also a numb sensation in her left arm.  It resolved after 2 to 3 minutes spontaneously.  The pain was not pleuritic or positional.  There was dyspnea but no diaphoresis.  Minimal nausea.  She has had this occasionally since that time lasting minutes.  It occurs both with exertion and at rest.  She has some dyspnea on exertion but no orthopnea, PND or pedal edema.  Cardiology now asked to evaluate.  Current Outpatient Medications  Medication Sig Dispense Refill  . acetaminophen (TYLENOL) 650 MG CR tablet Take 1,300 mg by mouth every 8 (eight) hours as needed for pain.    Marland Kitchen albuterol (PROVENTIL HFA;VENTOLIN HFA) 108 (90 Base) MCG/ACT inhaler Inhale 1-2 puffs into the lungs every 6 (six) hours as needed for wheezing or shortness of breath. 1 Inhaler 0  . amoxicillin-clavulanate (AUGMENTIN) 875-125 MG tablet Take 1 tablet by mouth every 12 (twelve) hours. 20  tablet 0  . Aromatic Inhalants (VICKS VAPOINHALER IN) Inhale 1 puff into the lungs daily as needed (congestion).    . Ascorbic Acid (VITAMIN C) 1000 MG tablet Take 1,000 mg by mouth daily.    . ASPERCREME LIDOCAINE EX Apply 1 application topically daily as needed (back pain).    Marland Kitchen aspirin 325 MG tablet Take 325 mg by mouth daily.     Marland Kitchen atorvastatin (LIPITOR) 20 MG tablet Take 20 mg by mouth at bedtime.    . Cholecalciferol (VITAMIN D3 PO) Take by mouth.    . clopidogrel (PLAVIX) 75 MG tablet TAKE ONE TABLET BY MOUTH AT BEDTIME 90 tablet 2  . diphenhydrAMINE-zinc acetate (BENADRYL) cream Apply 1 application topically daily as needed for itching.    . ferrous sulfate 325 (65 FE) MG tablet Take 325 mg by mouth every other day.    . ibuprofen (ADVIL,MOTRIN) 200 MG tablet Take 200-400 mg by mouth daily as needed for moderate pain.     Marland Kitchen loratadine (CLARITIN) 10 MG tablet Take 10 mg by mouth daily as needed for allergies.     Marland Kitchen OVER THE COUNTER MEDICATION Take 1 tablet by mouth every other day. MK-7    . vitamin B-12 (CYANOCOBALAMIN) 1000 MCG tablet Take 1,000 mcg by mouth daily.     No current facility-administered medications for this visit.    Allergies  Allergen Reactions  . Codeine Nausea And Vomiting    Dizziness, headache, has passed out  . Hydrocodone Nausea And Vomiting and Other (See Comments)  Headache and dizziness  . Other Shortness Of Breath    Allergy to cologne/perfume base  . Oxycodone Nausea And Vomiting and Other (See Comments)    Dizziness      Past Medical History:  Diagnosis Date  . Anemia   . Angina    March 2013  . Anxiety   . Asthma   . Blood transfusion   . Depression   . Fibromyalgia   . History of lead poisoning 1977  . Hyperlipidemia   . Hypertension   . Migraine   . Peripheral vascular disease (HCC)    claudication  right greater than left  femoral artery  . Small bowel obstruction, partial ([place]) 04/08/11  . Stroke ([place])   . 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 Uterine cancer [facility])    Pre cervical    Past Surgical History:  Procedure Laterality Date  . abdominal angiogram    . ABDOMINAL ANGIOGRAM N/A 02/05/2011   Procedure: ABDOMINAL ANGIOGRAM;  Surgeon: Serafina Mitchell, MD;  Location: [facility] CATH LAB;  Service: Cardiovascular;  Laterality: N/A;  . ABDOMINAL HYSTERECTOMY  2008  . AORTA - BILATERAL FEMORAL ARTERY BYPASS GRAFT  02/26/2011   Procedure: AORTA BIFEMORAL BYPASS GRAFT;  Surgeon: Theotis Burrow, MD;  Location: [place];  Service: Vascular;  Laterality: N/A;  . AORTIC ARCH ANGIOGRAPHY N/A 09/16/2017   Procedure: AORTIC ARCH ANGIOGRAPHY;  Surgeon: Serafina Mitchell, MD;  Location: [place] CV LAB;  Service: Cardiovascular;  Laterality: N/A;  . AORTIC ARCH ANGIOGRAPHY N/A 01/19/2019   Procedure: AORTIC ARCH ANGIOGRAPHY;  Surgeon: Serafina Mitchell, MD;  Location: [place] CV LAB;  Service: Cardiovascular;  Laterality: N/A;  Left upper extremity angiography  . ARCH AORTOGRAM N/A 06/24/2012   Procedure: ARCH AORTOGRAM;  Surgeon: Serafina Mitchell, MD;  Location: [facility] CATH LAB;  Service: Cardiovascular;  Laterality: N/A;  . BILATERAL UPPER EXTREMITY ANGIOGRAM Left 08/12/2013   Procedure: UPPER EXTREMITY ANGIOGRAM;  Surgeon: Serafina Mitchell, MD;  Location: [facility] CATH LAB;  Service: Cardiovascular;  Laterality: Left;  . CAROTID ENDARTERECTOMY Left 06/13/2011   Left  CEA  . CAROTID ENDARTERECTOMY Right 08-13-13   CEA  . CHOLECYSTECTOMY N/A 12/08/2012   Procedure: LAPAROSCOPIC CHOLECYSTECTOMY WITH INTRAOPERATIVE CHOLANGIOGRAM;  Surgeon: Haywood Lasso, MD;  Location: [place];  Service: General;  Laterality: N/A;  . COLONOSCOPY WITH PROPOFOL N/A 06/16/2014   Procedure: COLONOSCOPY WITH PROPOFOL;  Surgeon: Garlan Fair, MD;  Location: [facility] ENDOSCOPY;  Service: Endoscopy;  Laterality: N/A;  . EMBOLECTOMY Left 04/30/2015   Procedure: EMBOLECTOMY BRACHIAL, Radial and ulnar arteries with patch angioplasty of brachial artery.;  Surgeon: Elam Dutch, MD;  [facility];  Service: Vascular;  Laterality: Left;  . ENDARTERECTOMY  06/13/2011   Procedure: ENDARTERECTOMY CAROTID;  Surgeon: Serafina Mitchell, MD;  Location[facility] OR;  Service: Vascular;  Laterality: Left;  Left carotid artery endarterectomy with vascu-guard patch angioplasty  . ENDARTERECTOMY Right 08/13/2013   Procedure: RIGHT  CAROTID ENDARTERECTOMY CAROTID WITH BOVINE PERICARDIAL PATCH ANGIOPLASTY;  Surgeon: Serafina Mitchell, MD;  Location: [place] OR;  Service: Vascular;  Laterality: Right;  . ESOPHAGOGASTRODUODENOSCOPY (EGD) WITH PROPOFOL N/A 06/16/2014   Procedure: ESOPHAGOGASTRODUODENOSCOPY (EGD) WITH PROPOFOL;  Surgeon: Garlan Fair, MD;  Location: [facility] ENDOSCOPY;  Service: Endoscopy;  Laterality: N/A;  . LAPAROTOMY  04/10/2011   Procedure: EXPLORATORY LAPAROTOMY;  Surgeon: Shann Medal, MD;  Location: [place];  Service: General;  Laterality: N/A;  ENTEROLYSIS OF ADHESIONS  .  LOWER EXTREMITY ANGIOGRAM Left 06/24/12   Left stent intervention, left subclavian  . PERIPHERAL VASCULAR BALLOON ANGIOPLASTY Left 09/16/2017   Procedure: PERIPHERAL VASCULAR BALLOON ANGIOPLASTY;  Surgeon: Serafina Mitchell, MD;  Location: [place] CV LAB;  Service: Cardiovascular;  Laterality: Left;  brachial artery  . PERIPHERAL VASCULAR BALLOON ANGIOPLASTY Left 01/19/2019   Procedure: PERIPHERAL VASCULAR BALLOON ANGIOPLASTY;  Surgeon: Serafina Mitchell, MD;  Location: [place] CV LAB;  Service: Cardiovascular;  Laterality: Left;  brachial artery  . PERIPHERAL VASCULAR CATHETERIZATION N/A 04/26/2015   Procedure: Aortic Arch Angiography;  Surgeon: Serafina Mitchell, MD;  Location: [place] CV LAB;  Service: Cardiovascular;  Laterality: N/A;  . PERIPHERAL VASCULAR CATHETERIZATION  04/26/2015   Procedure: Peripheral Vascular Intervention;  Surgeon: Serafina Mitchell, MD;  Location: [place] CV LAB;  Service: Cardiovascular;;  lt illiac  . PERIPHERAL VASCULAR CATHETERIZATION N/A 12/05/2015   Procedure: Aortic  Arch Angiography;  Surgeon: Serafina Mitchell, MD;  Location: [place] CV LAB;  Service: Cardiovascular;  Laterality: N/A;  . PERIPHERAL VASCULAR CATHETERIZATION Left 12/05/2015   Procedure: Peripheral Vascular Intervention;  Surgeon: Serafina Mitchell, MD;  Location: [place] CV LAB;  Service: Cardiovascular;  Laterality: Left;  subclavian  . PERIPHERAL VASCULAR INTERVENTION Left 09/16/2017   Procedure: PERIPHERAL VASCULAR INTERVENTION;  Surgeon: Serafina Mitchell, MD;  Location: [place] CV LAB;  Service: Cardiovascular;  Laterality: Left;  subclavian artery  . PERIPHERAL VASCULAR INTERVENTION Left 01/19/2019   Procedure: PERIPHERAL VASCULAR INTERVENTION;  Surgeon: Serafina Mitchell, MD;  Location: [place] CV LAB;  Service: Cardiovascular;  Laterality: Left;  subclavian stent  . PR VEIN BYPASS GRAFT,AORTO-FEM-POP  02/26/11  . UPPER EXTREMITY ANGIOGRAPHY Left 09/16/2017   Procedure: Upper Extremity Angiography;  Surgeon: Serafina Mitchell, MD;  Location: [place] CV LAB;  Service: Cardiovascular;  Laterality: Left;    Social History   Socioeconomic History  . Marital status: Legally Separated    Spouse name: Not on file  . Number of children: 2  . Years of education: Not on file  . Highest education level: Not on file  Occupational History  . Not on file  Tobacco Use  . Smoking status: Current Some Day Smoker    Packs/day: 0.10    Years: 45.00    Pack years: 4.50    Types: Cigarettes  . Smokeless tobacco: Never Used  . Tobacco comment: pt states that she smokes about 6 cigs per day  Vaping Use  . Vaping Use: Never used  Substance and Sexual Activity  . Alcohol use: No    Alcohol/week: 0.0 standard drinks  . Drug use: No    Comment: History of Cocaine and marijuana abuse: "not in many years" (04/08/11  . Sexual activity: Not Currently  Other Topics Concern  . Not on file  Social History Narrative   Pt lives at home with her 2 grandsons. She has been separated for 12yrs, has  two children, and has a 12th grade education level. She drinks 3 cups of coffee daily.   Social Determinants of Health   Financial Resource Strain:   . Difficulty of Paying Living Expenses: Not on file  Food Insecurity:   . Worried About Charity fundraiser in the Last Year: Not on file  . Ran Out of Food in the Last Year: Not on file  Transportation Needs:   . Lack of Transportation (Medical): Not on file  . Lack of Transportation (Non-Medical): Not on file  Physical  Activity:   . Days of Exercise per Week: Not on file  . Minutes of Exercise per Session: Not on file  Stress:   . Feeling of Stress : Not on file  Social Connections:   . Frequency of Communication with Friends and Family: Not on file  . Frequency of Social Gatherings with Friends and Family: Not on file  . Attends Religious Services: Not on file  . Active Member of Clubs or Organizations: Not on file  . Attends Archivist Meetings: Not on file  . Marital Status: Not on file  Intimate Partner Violence:   . Fear of Current or Ex-Partner: Not on file  . Emotionally Abused: Not on file  . Physically Abused: Not on file  . Sexually Abused: Not on file    Family History  Problem Relation Age of Onset  . Cancer Mother        breast, colon, ovarian  . Cancer Brother 50       lung  . Hyperlipidemia Brother   . Cancer Other   . Stroke Other   . Cancer Maternal Grandmother        ovarian  . Anesthesia problems Neg Hx     ROS: no fevers or chills, productive cough, hemoptysis, dysphasia, odynophagia, melena, hematochezia, dysuria, hematuria, rash, seizure activity, orthopnea, PND, pedal edema, claudication. Remaining systems are negative.  Physical Exam:   Blood pressure 132/64, pulse 73, height 5\' 4"  (1.626 m), weight 117 lb 9.6 oz (53.3 kg), SpO2 99 %.  General:  Well developed/well nourished in NAD Skin warm/dry Patient not depressed No peripheral clubbing Back-normal HEENT-normal/normal  eyelids Neck supple/normal carotid upstroke bilaterally; no bruits; no JVD; no thyromegaly chest - CTA/ normal expansion CV - RRR/normal S1 and S2; no murmurs, rubs or gallops;  PMI nondisplaced Abdomen -NT/ND, no HSM, no mass, + bowel sounds, no bruit 2+ femoral pulses, no bruits Ext-no edema, chords, 2+ DP Neuro-grossly nonfocal  ECG -October 27, 2019-sinus bradycardia, incomplete right bundle branch block, cannot rule out septal infarct.  Personally reviewed  A/P  1 chest pain-symptoms with both typical and atypical features.  Previous electrocardiogram showed no ST changes and troponins normal.  I discussed options including stress test versus cardiac catheterization.  I do not think a cardiac CT would be helpful as I think she would have significant calcification given her history of vascular disease.  She has severe vascular disease and I therefore feel that definitive evaluation is warranted.  Plan cardiac catheterization.  The risks and benefits including myocardial infarction, CVA and death discussed and she agrees to proceed.  Continue Plavix and statin.  2 peripheral vascular disease-followed by vascular surgery.  Continue plavix and statin.  3 hyperlipidemia-she is on lower dose statin as she has not tolerated other statins in the past.  We will check lipids and if LDL greater than 70 add Zetia or Repatha.  4 hypertension-patient's blood pressure is controlled.  Continue present medications and follow.  5 tobacco abuse-patient counseled on discontinuing.  Kirk Ruths, MD

## 2019-11-17 NOTE — Progress Notes (Signed)
Referring-Austin Skakle DO Reason for referral-chest pain  HPI: 69 year old female for evaluation of chest pain at request of Sueanne Margarita DO.  Nuclear study January 2013 normal with ejection fraction 79%.  Patient does have a history of peripheral vascular disease and has had previous left subclavian artery stent and left brachial artery angioplasty, right brachial artery embolectomy, previous left carotid endarterectomy and aortobifemoral bypass graft.  Most recent carotid Dopplers August 2021 showed 40 to 59% bilateral stenosis.  Dopplers November 2021 showed patent aortobifemoral bypass graft.  ABIs mild on the right and moderate on the left.  Seen in the emergency room October 27, 2019 with chest pain.  Hemoglobin 13.8 and creatinine 1.03.  Troponins normal.  Chest x-ray showed emphysema.  No prior cardiac history.  Patient states that for the past 2 weeks she has had occasional chest pain.  Her first episode occurred while she was walking her dog.  She described a pressure in her chest that radiated to her shoulder and left arm.  There was also a numb sensation in her left arm.  It resolved after 2 to 3 minutes spontaneously.  The pain was not pleuritic or positional.  There was dyspnea but no diaphoresis.  Minimal nausea.  She has had this occasionally since that time lasting minutes.  It occurs both with exertion and at rest.  She has some dyspnea on exertion but no orthopnea, PND or pedal edema.  Cardiology now asked to evaluate.  Current Outpatient Medications  Medication Sig Dispense Refill  . acetaminophen (TYLENOL) 650 MG CR tablet Take 1,300 mg by mouth every 8 (eight) hours as needed for pain.    Marland Kitchen albuterol (PROVENTIL HFA;VENTOLIN HFA) 108 (90 Base) MCG/ACT inhaler Inhale 1-2 puffs into the lungs every 6 (six) hours as needed for wheezing or shortness of breath. 1 Inhaler 0  . amoxicillin-clavulanate (AUGMENTIN) 875-125 MG tablet Take 1 tablet by mouth every 12 (twelve) hours. 20  tablet 0  . Aromatic Inhalants (VICKS VAPOINHALER IN) Inhale 1 puff into the lungs daily as needed (congestion).    . Ascorbic Acid (VITAMIN C) 1000 MG tablet Take 1,000 mg by mouth daily.    . ASPERCREME LIDOCAINE EX Apply 1 application topically daily as needed (back pain).    Marland Kitchen aspirin 325 MG tablet Take 325 mg by mouth daily.     Marland Kitchen atorvastatin (LIPITOR) 20 MG tablet Take 20 mg by mouth at bedtime.    . Cholecalciferol (VITAMIN D3 PO) Take by mouth.    . clopidogrel (PLAVIX) 75 MG tablet TAKE ONE TABLET BY MOUTH AT BEDTIME 90 tablet 2  . diphenhydrAMINE-zinc acetate (BENADRYL) cream Apply 1 application topically daily as needed for itching.    . ferrous sulfate 325 (65 FE) MG tablet Take 325 mg by mouth every other day.    . ibuprofen (ADVIL,MOTRIN) 200 MG tablet Take 200-400 mg by mouth daily as needed for moderate pain.     Marland Kitchen loratadine (CLARITIN) 10 MG tablet Take 10 mg by mouth daily as needed for allergies.     Marland Kitchen OVER THE COUNTER MEDICATION Take 1 tablet by mouth every other day. MK-7    . vitamin B-12 (CYANOCOBALAMIN) 1000 MCG tablet Take 1,000 mcg by mouth daily.     No current facility-administered medications for this visit.    Allergies  Allergen Reactions  . Codeine Nausea And Vomiting    Dizziness, headache, has passed out  . Hydrocodone Nausea And Vomiting and Other (See Comments)  Headache and dizziness  . Other Shortness Of Breath    Allergy to cologne/perfume base  . Oxycodone Nausea And Vomiting and Other (See Comments)    Dizziness      Past Medical History:  Diagnosis Date  . Anemia   . Angina    March 2013  . Anxiety   . Asthma   . Blood transfusion   . Depression   . Fibromyalgia   . History of lead poisoning 1977  . Hyperlipidemia   . Hypertension   . Migraine   . Peripheral vascular disease (HCC)    claudication  right greater than left  femoral artery  . Small bowel obstruction, partial ([place]) 04/08/11  . Stroke ([place])   . 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 Uterine cancer [facility])    Pre cervical    Past Surgical History:  Procedure Laterality Date  . abdominal angiogram    . ABDOMINAL ANGIOGRAM N/A 02/05/2011   Procedure: ABDOMINAL ANGIOGRAM;  Surgeon: Serafina Mitchell, MD;  Location: [facility] CATH LAB;  Service: Cardiovascular;  Laterality: N/A;  . ABDOMINAL HYSTERECTOMY  2008  . AORTA - BILATERAL FEMORAL ARTERY BYPASS GRAFT  02/26/2011   Procedure: AORTA BIFEMORAL BYPASS GRAFT;  Surgeon: Theotis Burrow, MD;  Location: [place];  Service: Vascular;  Laterality: N/A;  . AORTIC ARCH ANGIOGRAPHY N/A 09/16/2017   Procedure: AORTIC ARCH ANGIOGRAPHY;  Surgeon: Serafina Mitchell, MD;  Location: [place] CV LAB;  Service: Cardiovascular;  Laterality: N/A;  . AORTIC ARCH ANGIOGRAPHY N/A 01/19/2019   Procedure: AORTIC ARCH ANGIOGRAPHY;  Surgeon: Serafina Mitchell, MD;  Location: [place] CV LAB;  Service: Cardiovascular;  Laterality: N/A;  Left upper extremity angiography  . ARCH AORTOGRAM N/A 06/24/2012   Procedure: ARCH AORTOGRAM;  Surgeon: Serafina Mitchell, MD;  Location: [facility] CATH LAB;  Service: Cardiovascular;  Laterality: N/A;  . BILATERAL UPPER EXTREMITY ANGIOGRAM Left 08/12/2013   Procedure: UPPER EXTREMITY ANGIOGRAM;  Surgeon: Serafina Mitchell, MD;  Location: [facility] CATH LAB;  Service: Cardiovascular;  Laterality: Left;  . CAROTID ENDARTERECTOMY Left 06/13/2011   Left  CEA  . CAROTID ENDARTERECTOMY Right 08-13-13   CEA  . CHOLECYSTECTOMY N/A 12/08/2012   Procedure: LAPAROSCOPIC CHOLECYSTECTOMY WITH INTRAOPERATIVE CHOLANGIOGRAM;  Surgeon: Haywood Lasso, MD;  Location: [place];  Service: General;  Laterality: N/A;  . COLONOSCOPY WITH PROPOFOL N/A 06/16/2014   Procedure: COLONOSCOPY WITH PROPOFOL;  Surgeon: Garlan Fair, MD;  Location: [facility] ENDOSCOPY;  Service: Endoscopy;  Laterality: N/A;  . EMBOLECTOMY Left 04/30/2015   Procedure: EMBOLECTOMY BRACHIAL, Radial and ulnar arteries with patch angioplasty of brachial artery.;  Surgeon: Elam Dutch, MD;  Location[facility] OR;  Service: Vascular;  Laterality: Left;  . ENDARTERECTOMY  06/13/2011   Procedure: ENDARTERECTOMY CAROTID;  Surgeon: Serafina Mitchell, MD;  Location: [facility] OR;  Service: Vascular;  Laterality: Left;  Left carotid artery endarterectomy with vascu-guard patch angioplasty  . ENDARTERECTOMY Right 08/13/2013   Procedure: RIGHT  CAROTID ENDARTERECTOMY CAROTID WITH BOVINE PERICARDIAL PATCH ANGIOPLASTY;  Surgeon: Serafina Mitchell, MD;  Location: [place] OR;  Service: Vascular;  Laterality: Right;  . ESOPHAGOGASTRODUODENOSCOPY (EGD) WITH PROPOFOL N/A 06/16/2014   Procedure: ESOPHAGOGASTRODUODENOSCOPY (EGD) WITH PROPOFOL;  Surgeon: Garlan Fair, MD;  Location: [facility] ENDOSCOPY;  Service: Endoscopy;  Laterality: N/A;  . LAPAROTOMY  04/10/2011   Procedure: EXPLORATORY LAPAROTOMY;  Surgeon: Shann Medal, MD;  Location: [place];  Service: General;  Laterality: N/A;  ENTEROLYSIS OF ADHESIONS  .  LOWER EXTREMITY ANGIOGRAM Left 06/24/12   Left stent intervention, left subclavian  . PERIPHERAL VASCULAR BALLOON ANGIOPLASTY Left 09/16/2017   Procedure: PERIPHERAL VASCULAR BALLOON ANGIOPLASTY;  Surgeon: Serafina Mitchell, MD;  Location: [place] CV LAB;  Service: Cardiovascular;  Laterality: Left;  brachial artery  . PERIPHERAL VASCULAR BALLOON ANGIOPLASTY Left 01/19/2019   Procedure: PERIPHERAL VASCULAR BALLOON ANGIOPLASTY;  Surgeon: Serafina Mitchell, MD;  Location: [place] CV LAB;  Service: Cardiovascular;  Laterality: Left;  brachial artery  . PERIPHERAL VASCULAR CATHETERIZATION N/A 04/26/2015   Procedure: Aortic Arch Angiography;  Surgeon: Serafina Mitchell, MD;  Location: [place] CV LAB;  Service: Cardiovascular;  Laterality: N/A;  . PERIPHERAL VASCULAR CATHETERIZATION  04/26/2015   Procedure: Peripheral Vascular Intervention;  Surgeon: Serafina Mitchell, MD;  Location: [place] CV LAB;  Service: Cardiovascular;;  lt illiac  . PERIPHERAL VASCULAR CATHETERIZATION N/A 12/05/2015   Procedure: Aortic  Arch Angiography;  Surgeon: Serafina Mitchell, MD;  Location: [place] CV LAB;  Service: Cardiovascular;  Laterality: N/A;  . PERIPHERAL VASCULAR CATHETERIZATION Left 12/05/2015   Procedure: Peripheral Vascular Intervention;  Surgeon: Serafina Mitchell, MD;  Location: [place] CV LAB;  Service: Cardiovascular;  Laterality: Left;  subclavian  . PERIPHERAL VASCULAR INTERVENTION Left 09/16/2017   Procedure: PERIPHERAL VASCULAR INTERVENTION;  Surgeon: Serafina Mitchell, MD;  Location: [place] CV LAB;  Service: Cardiovascular;  Laterality: Left;  subclavian artery  . PERIPHERAL VASCULAR INTERVENTION Left 01/19/2019   Procedure: PERIPHERAL VASCULAR INTERVENTION;  Surgeon: Serafina Mitchell, MD;  Location: [place] CV LAB;  Service: Cardiovascular;  Laterality: Left;  subclavian stent  . PR VEIN BYPASS GRAFT,AORTO-FEM-POP  02/26/11  . UPPER EXTREMITY ANGIOGRAPHY Left 09/16/2017   Procedure: Upper Extremity Angiography;  Surgeon: Serafina Mitchell, MD;  Location: [place] CV LAB;  Service: Cardiovascular;  Laterality: Left;    Social History   Socioeconomic History  . Marital status: Legally Separated    Spouse name: Not on file  . Number of children: 2  . Years of education: Not on file  . Highest education level: Not on file  Occupational History  . Not on file  Tobacco Use  . Smoking status: Current Some Day Smoker    Packs/day: 0.10    Years: 45.00    Pack years: 4.50    Types: Cigarettes  . Smokeless tobacco: Never Used  . Tobacco comment: pt states that she smokes about 6 cigs per day  Vaping Use  . Vaping Use: Never used  Substance and Sexual Activity  . Alcohol use: No    Alcohol/week: 0.0 standard drinks  . Drug use: No    Comment: History of Cocaine and marijuana abuse: "not in many years" (04/08/11  . Sexual activity: Not Currently  Other Topics Concern  . Not on file  Social History Narrative   Pt lives at home with her 2 grandsons. She has been separated for 39yrs, has  two children, and has a 12th grade education level. She drinks 3 cups of coffee daily.   Social Determinants of Health   Financial Resource Strain:   . Difficulty of Paying Living Expenses: Not on file  Food Insecurity:   . Worried About Charity fundraiser in the Last Year: Not on file  . Ran Out of Food in the Last Year: Not on file  Transportation Needs:   . Lack of Transportation (Medical): Not on file  . Lack of Transportation (Non-Medical): Not on file  Physical  Activity:   . Days of Exercise per Week: Not on file  . Minutes of Exercise per Session: Not on file  Stress:   . Feeling of Stress : Not on file  Social Connections:   . Frequency of Communication with Friends and Family: Not on file  . Frequency of Social Gatherings with Friends and Family: Not on file  . Attends Religious Services: Not on file  . Active Member of Clubs or Organizations: Not on file  . Attends Archivist Meetings: Not on file  . Marital Status: Not on file  Intimate Partner Violence:   . Fear of Current or Ex-Partner: Not on file  . Emotionally Abused: Not on file  . Physically Abused: Not on file  . Sexually Abused: Not on file    Family History  Problem Relation Age of Onset  . Cancer Mother        breast, colon, ovarian  . Cancer Brother 50       lung  . Hyperlipidemia Brother   . Cancer Other   . Stroke Other   . Cancer Maternal Grandmother        ovarian  . Anesthesia problems Neg Hx     ROS: no fevers or chills, productive cough, hemoptysis, dysphasia, odynophagia, melena, hematochezia, dysuria, hematuria, rash, seizure activity, orthopnea, PND, pedal edema, claudication. Remaining systems are negative.  Physical Exam:   Blood pressure 132/64, pulse 73, height 5\' 4"  (1.626 m), weight 117 lb 9.6 oz (53.3 kg), SpO2 99 %.  General:  Well developed/well nourished in NAD Skin warm/dry Patient not depressed No peripheral clubbing Back-normal HEENT-normal/normal  eyelids Neck supple/normal carotid upstroke bilaterally; no bruits; no JVD; no thyromegaly chest - CTA/ normal expansion CV - RRR/normal S1 and S2; no murmurs, rubs or gallops;  PMI nondisplaced Abdomen -NT/ND, no HSM, no mass, + bowel sounds, no bruit 2+ femoral pulses, no bruits Ext-no edema, chords, 2+ DP Neuro-grossly nonfocal  ECG -October 27, 2019-sinus bradycardia, incomplete right bundle branch block, cannot rule out septal infarct.  Personally reviewed  A/P  1 chest pain-symptoms with both typical and atypical features.  Previous electrocardiogram showed no ST changes and troponins normal.  I discussed options including stress test versus cardiac catheterization.  I do not think a cardiac CT would be helpful as I think she would have significant calcification given her history of vascular disease.  She has severe vascular disease and I therefore feel that definitive evaluation is warranted.  Plan cardiac catheterization.  The risks and benefits including myocardial infarction, CVA and death discussed and she agrees to proceed.  Continue Plavix and statin.  2 peripheral vascular disease-followed by vascular surgery.  Continue plavix and statin.  3 hyperlipidemia-she is on lower dose statin as she has not tolerated other statins in the past.  We will check lipids and if LDL greater than 70 add Zetia or Repatha.  4 hypertension-patient's blood pressure is controlled.  Continue present medications and follow.  5 tobacco abuse-patient counseled on discontinuing.  Kirk Ruths, MD

## 2019-11-17 NOTE — Patient Instructions (Signed)
    [place] [address] Dept: [phone] Loc: [place]  11/17/2019  You are scheduled for a Cardiac Catheterization on Friday, November 12 with Dr. Kathlyn Sacramento.  1. Please arrive at the [facility] (Main Entrance A) at [facility]: [address] [place], [place] [address] at 7:00 AM (This time is two hours before your procedure to ensure your preparation). Free valet parking service is available.   Special note: Every effort is made to have your procedure done on time. Please understand that emergencies sometimes delay scheduled procedures.  2. Diet: Do not eat solid foods after midnight.  The patient may have clear liquids until 5am upon the day of the procedure.  4. Medication instructions in preparation for your procedure:  GO TO [place] @ 12:35 PM FOR COVID TESTING  On the morning of your procedure, take your Plavix/Clopidogrel and any morning medicines NOT listed above.  You may use sips of water.  5. Plan for one night stay--bring personal belongings. 6. Bring a current list of your medications and current insurance cards. 7. You MUST have a responsible person to drive you home. 8. Someone MUST be with you the first 24 hours after you arrive home or your discharge will be delayed. 9. Please wear clothes that are easy to get on and off and wear slip-on shoes.  Thank you for allowing [place] to care for you!   -- [facility]   Your physician recommends that you schedule a follow-up appointment in: [place]

## 2019-11-18 ENCOUNTER — Telehealth: Payer: Self-pay | Admitting: *Deleted

## 2019-11-18 NOTE — Telephone Encounter (Addendum)
Pt contacted pre-catheterization scheduled at [facility] for: Friday November 19, 2019 9 AM Verified arrival time and place: [facility]) at: 7 AM   No solid food after midnight prior to cath, clear liquids until 5 AM day of procedure.  Hold: Ibuprofen-until post procedure-GFR 56  Except hold medicationsAM meds can be  taken pre-cath with sips of water including: ASA 81 mg Plavix 75 mg  Confirmed patient has responsible adult to drive home post procedure and be with patient first 24 hours after arriving home: yes  You are allowed ONE visitor in the waiting room during the time you are at the hospital for your procedure. Both you and your visitor must wear a mask once you enter the hospital.       COVID-19 Pre-Screening Questions:  . In the past 14 days have you had a new cough, new headache, new nasal congestion, fever (100.4 or greater) unexplained body aches, new sore throat, or sudden loss of taste or sense of smell? no . In the past 14 days have you been around anyone with known Covid 19? no   Reviewed procedure/mask/visitor instructions, COVID-19 questions with patient.

## 2019-11-19 ENCOUNTER — Inpatient Hospital Stay (HOSPITAL_COMMUNITY)
Admission: RE | Disposition: A | Discharge status: Home / Self Care | Payer: Self-pay | Source: Home / Self Care | Attending: Cardiovascular Disease

## 2019-11-19 ENCOUNTER — Inpatient Hospital Stay (HOSPITAL_COMMUNITY): Payer: Medicare HMO

## 2019-11-19 ENCOUNTER — Other Ambulatory Visit: Payer: Self-pay

## 2019-11-19 ENCOUNTER — Encounter (HOSPITAL_COMMUNITY): Payer: Self-pay | Admitting: Cardiovascular Disease

## 2019-11-19 ENCOUNTER — Inpatient Hospital Stay (HOSPITAL_COMMUNITY)
Admission: RE | Admit: 2019-11-19 | Discharge: 2019-11-23 | DRG: 247 | Disposition: A | Discharge status: Home / Self Care | Payer: Medicare HMO | Attending: Cardiovascular Disease | Admitting: Cardiovascular Disease

## 2019-11-19 DIAGNOSIS — I739 Peripheral vascular disease, unspecified: Secondary | ICD-10-CM | POA: Diagnosis present

## 2019-11-19 DIAGNOSIS — Z885 Allergy status to narcotic agent status: Secondary | ICD-10-CM

## 2019-11-19 DIAGNOSIS — I4510 Unspecified right bundle-branch block: Secondary | ICD-10-CM | POA: Diagnosis present

## 2019-11-19 DIAGNOSIS — E7800 Pure hypercholesterolemia, unspecified: Secondary | ICD-10-CM | POA: Diagnosis present

## 2019-11-19 DIAGNOSIS — Z800 Family history of malignant neoplasm of digestive organs: Secondary | ICD-10-CM

## 2019-11-19 DIAGNOSIS — F17200 Nicotine dependence, unspecified, uncomplicated: Secondary | ICD-10-CM | POA: Diagnosis present

## 2019-11-19 DIAGNOSIS — I358 Other nonrheumatic aortic valve disorders: Secondary | ICD-10-CM | POA: Diagnosis present

## 2019-11-19 DIAGNOSIS — Z7902 Long term (current) use of antithrombotics/antiplatelets: Secondary | ICD-10-CM

## 2019-11-19 DIAGNOSIS — Z8041 Family history of malignant neoplasm of ovary: Secondary | ICD-10-CM | POA: Diagnosis not present

## 2019-11-19 DIAGNOSIS — M797 Fibromyalgia: Secondary | ICD-10-CM | POA: Diagnosis present

## 2019-11-19 DIAGNOSIS — Z888 Allergy status to other drugs, medicaments and biological substances status: Secondary | ICD-10-CM | POA: Diagnosis not present

## 2019-11-19 DIAGNOSIS — I10 Essential (primary) hypertension: Secondary | ICD-10-CM

## 2019-11-19 DIAGNOSIS — F17210 Nicotine dependence, cigarettes, uncomplicated: Secondary | ICD-10-CM | POA: Diagnosis present

## 2019-11-19 DIAGNOSIS — I2510 Atherosclerotic heart disease of native coronary artery without angina pectoris: Secondary | ICD-10-CM | POA: Diagnosis not present

## 2019-11-19 DIAGNOSIS — I200 Unstable angina: Secondary | ICD-10-CM | POA: Diagnosis not present

## 2019-11-19 DIAGNOSIS — Z801 Family history of malignant neoplasm of trachea, bronchus and lung: Secondary | ICD-10-CM

## 2019-11-19 DIAGNOSIS — Z79899 Other long term (current) drug therapy: Secondary | ICD-10-CM | POA: Diagnosis not present

## 2019-11-19 DIAGNOSIS — Z90710 Acquired absence of both cervix and uterus: Secondary | ICD-10-CM | POA: Diagnosis not present

## 2019-11-19 DIAGNOSIS — J439 Emphysema, unspecified: Secondary | ICD-10-CM | POA: Diagnosis present

## 2019-11-19 DIAGNOSIS — Z8542 Personal history of malignant neoplasm of other parts of uterus: Secondary | ICD-10-CM | POA: Diagnosis not present

## 2019-11-19 DIAGNOSIS — Z8673 Personal history of transient ischemic attack (TIA), and cerebral infarction without residual deficits: Secondary | ICD-10-CM | POA: Diagnosis not present

## 2019-11-19 DIAGNOSIS — Z803 Family history of malignant neoplasm of breast: Secondary | ICD-10-CM

## 2019-11-19 DIAGNOSIS — Z9049 Acquired absence of other specified parts of digestive tract: Secondary | ICD-10-CM

## 2019-11-19 DIAGNOSIS — Z7982 Long term (current) use of aspirin: Secondary | ICD-10-CM | POA: Diagnosis not present

## 2019-11-19 DIAGNOSIS — I361 Nonrheumatic tricuspid (valve) insufficiency: Secondary | ICD-10-CM | POA: Diagnosis not present

## 2019-11-19 DIAGNOSIS — Z20822 Contact with and (suspected) exposure to covid-19: Secondary | ICD-10-CM | POA: Diagnosis present

## 2019-11-19 DIAGNOSIS — E785 Hyperlipidemia, unspecified: Secondary | ICD-10-CM | POA: Diagnosis present

## 2019-11-19 DIAGNOSIS — I2584 Coronary atherosclerosis due to calcified coronary lesion: Secondary | ICD-10-CM | POA: Diagnosis present

## 2019-11-19 DIAGNOSIS — I25110 Atherosclerotic heart disease of native coronary artery with unstable angina pectoris: Principal | ICD-10-CM | POA: Diagnosis present

## 2019-11-19 DIAGNOSIS — Z823 Family history of stroke: Secondary | ICD-10-CM

## 2019-11-19 DIAGNOSIS — I2583 Coronary atherosclerosis due to lipid rich plaque: Secondary | ICD-10-CM | POA: Diagnosis not present

## 2019-11-19 DIAGNOSIS — Z83438 Family history of other disorder of lipoprotein metabolism and other lipidemia: Secondary | ICD-10-CM

## 2019-11-19 DIAGNOSIS — R079 Chest pain, unspecified: Secondary | ICD-10-CM | POA: Diagnosis not present

## 2019-11-19 DIAGNOSIS — Z955 Presence of coronary angioplasty implant and graft: Secondary | ICD-10-CM

## 2019-11-19 DIAGNOSIS — R072 Precordial pain: Secondary | ICD-10-CM

## 2019-11-19 HISTORY — DX: Atherosclerotic heart disease of native coronary artery without angina pectoris: I25.10

## 2019-11-19 HISTORY — PX: LEFT HEART CATH AND CORONARY ANGIOGRAPHY: CATH118249

## 2019-11-19 LAB — POCT ACTIVATED CLOTTING TIME: Activated Clotting Time: 263 seconds

## 2019-11-19 SURGERY — LEFT HEART CATH AND CORONARY ANGIOGRAPHY
Anesthesia: LOCAL

## 2019-11-19 MED ORDER — VERAPAMIL HCL 2.5 MG/ML IV SOLN
INTRAVENOUS | Status: DC | PRN
  Administered 2019-11-19 (×2): 10 mL via INTRA_ARTERIAL

## 2019-11-19 MED ORDER — ALBUTEROL SULFATE HFA 108 (90 BASE) MCG/ACT IN AERS
1.0000 | INHALATION_SPRAY | Freq: Four times a day (QID) | RESPIRATORY_TRACT | Status: DC | PRN
Start: 2019-11-19 — End: 2019-11-19

## 2019-11-19 MED ORDER — NITROGLYCERIN 1 MG/10 ML FOR IR/CATH LAB
INTRA_ARTERIAL | Status: AC
  Filled 2019-11-19: qty 10

## 2019-11-19 MED ORDER — SODIUM CHLORIDE 0.9% FLUSH: 3.0000 mL | INTRAVENOUS | Status: DC | PRN

## 2019-11-19 MED ORDER — ASPIRIN 325 MG PO TABS
162.0000 mg | ORAL_TABLET | ORAL | Status: DC
  Administered 2019-11-21: 162 mg via ORAL
  Filled 2019-11-19 (×3): qty 1

## 2019-11-19 MED ORDER — LIDOCAINE HCL (PF) 1 % IJ SOLN
INTRAMUSCULAR | Status: DC | PRN
  Administered 2019-11-19: 2 mL

## 2019-11-19 MED ORDER — SODIUM CHLORIDE 0.9 % WEIGHT BASED INFUSION: 1.0000 mL/kg/h | INTRAVENOUS | Status: DC

## 2019-11-19 MED ORDER — HEPARIN SODIUM (PORCINE) 1000 UNIT/ML IJ SOLN
INTRAMUSCULAR | Status: AC
  Filled 2019-11-19: qty 1

## 2019-11-19 MED ORDER — SODIUM CHLORIDE 0.9 % IV SOLN: INTRAVENOUS | Status: AC

## 2019-11-19 MED ORDER — ONDANSETRON HCL 4 MG/2ML IJ SOLN: 4.0000 mg | Freq: Four times a day (QID) | INTRAMUSCULAR | Status: DC | PRN

## 2019-11-19 MED ORDER — HEPARIN (PORCINE) IN NACL 1000-0.9 UT/500ML-% IV SOLN
INTRAVENOUS | Status: DC | PRN
  Administered 2019-11-19: 500 mL

## 2019-11-19 MED ORDER — LIDOCAINE HCL (PF) 1 % IJ SOLN
INTRAMUSCULAR | Status: AC
  Filled 2019-11-19: qty 30

## 2019-11-19 MED ORDER — CLOPIDOGREL BISULFATE 75 MG PO TABS
75.0000 mg | ORAL_TABLET | Freq: Every day | ORAL | Status: DC
  Administered 2019-11-20 – 2019-11-22 (×3): 75 mg via ORAL
  Filled 2019-11-19 (×3): qty 1

## 2019-11-19 MED ORDER — FERROUS SULFATE 325 (65 FE) MG PO TABS
325.0000 mg | ORAL_TABLET | ORAL | Status: DC
  Administered 2019-11-22: 325 mg via ORAL
  Filled 2019-11-19 (×3): qty 1

## 2019-11-19 MED ORDER — SODIUM CHLORIDE 0.9% FLUSH
3.0000 mL | Freq: Two times a day (BID) | INTRAVENOUS | Status: DC
  Administered 2019-11-19 – 2019-11-23 (×8): 3 mL via INTRAVENOUS

## 2019-11-19 MED ORDER — FENTANYL CITRATE (PF) 100 MCG/2ML IJ SOLN
INTRAMUSCULAR | Status: DC | PRN
  Administered 2019-11-19: 50 ug via INTRAVENOUS

## 2019-11-19 MED ORDER — SODIUM CHLORIDE 0.9 % IV SOLN: 250.0000 mL | INTRAVENOUS | Status: DC | PRN

## 2019-11-19 MED ORDER — VITAMIN B-12 1000 MCG PO TABS
5000.0000 ug | ORAL_TABLET | Freq: Every day | ORAL | Status: DC
  Administered 2019-11-20: 5000 ug via ORAL
  Filled 2019-11-19 (×2): qty 5

## 2019-11-19 MED ORDER — IOHEXOL 350 MG/ML SOLN
INTRAVENOUS | Status: DC | PRN
  Administered 2019-11-19: 235 mL

## 2019-11-19 MED ORDER — ALBUTEROL SULFATE (2.5 MG/3ML) 0.083% IN NEBU
2.5000 mg | INHALATION_SOLUTION | Freq: Four times a day (QID) | RESPIRATORY_TRACT | Status: DC | PRN
  Administered 2019-11-21: 2.5 mg via RESPIRATORY_TRACT
  Filled 2019-11-19: qty 3

## 2019-11-19 MED ORDER — LORATADINE 10 MG PO TABS
10.0000 mg | ORAL_TABLET | Freq: Every day | ORAL | Status: DC | PRN
  Administered 2019-11-20: 10 mg via ORAL
  Filled 2019-11-19: qty 1

## 2019-11-19 MED ORDER — VITAMIN D 25 MCG (1000 UNIT) PO TABS
2000.0000 [IU] | ORAL_TABLET | Freq: Every day | ORAL | Status: DC
  Administered 2019-11-20 – 2019-11-23 (×4): 2000 [IU] via ORAL
  Filled 2019-11-19 (×8): qty 2

## 2019-11-19 MED ORDER — FENTANYL CITRATE (PF) 100 MCG/2ML IJ SOLN
INTRAMUSCULAR | Status: AC
  Filled 2019-11-19: qty 2

## 2019-11-19 MED ORDER — DIPHENHYDRAMINE-ZINC ACETATE 2-0.1 % EX CREA: 1.0000 "application " | TOPICAL_CREAM | Freq: Every day | CUTANEOUS | Status: DC | PRN

## 2019-11-19 MED ORDER — SODIUM CHLORIDE 0.9 % WEIGHT BASED INFUSION
3.0000 mL/kg/h | INTRAVENOUS | Status: DC
  Administered 2019-11-19: 3 mL/kg/h via INTRAVENOUS

## 2019-11-19 MED ORDER — VERAPAMIL HCL 2.5 MG/ML IV SOLN
INTRAVENOUS | Status: AC
  Filled 2019-11-19: qty 2

## 2019-11-19 MED ORDER — HEPARIN (PORCINE) IN NACL 1000-0.9 UT/500ML-% IV SOLN
INTRAVENOUS | Status: AC
  Filled 2019-11-19: qty 1000

## 2019-11-19 MED ORDER — MIDAZOLAM HCL 2 MG/2ML IJ SOLN
INTRAMUSCULAR | Status: AC
  Filled 2019-11-19: qty 2

## 2019-11-19 MED ORDER — ACETAMINOPHEN 325 MG PO TABS
650.0000 mg | ORAL_TABLET | ORAL | Status: DC | PRN
  Administered 2019-11-22 – 2019-11-23 (×2): 650 mg via ORAL
  Filled 2019-11-19 (×2): qty 2

## 2019-11-19 MED ORDER — AMISULPRIDE (ANTIEMETIC) 5 MG/2ML IV SOLN
INTRAVENOUS | Status: AC
  Filled 2019-11-19: qty 4

## 2019-11-19 MED ORDER — HEPARIN SODIUM (PORCINE) 1000 UNIT/ML IJ SOLN
INTRAMUSCULAR | Status: DC | PRN
  Administered 2019-11-19 (×2): 3000 [IU] via INTRAVENOUS
  Administered 2019-11-19: 1500 [IU] via INTRAVENOUS

## 2019-11-19 MED ORDER — ATORVASTATIN CALCIUM 10 MG PO TABS
20.0000 mg | ORAL_TABLET | ORAL | Status: DC
  Filled 2019-11-19 (×4): qty 2

## 2019-11-19 MED ORDER — ASPIRIN 81 MG PO CHEW: 81.0000 mg | CHEWABLE_TABLET | ORAL | Status: DC

## 2019-11-19 MED ORDER — MIDAZOLAM HCL 2 MG/2ML IJ SOLN
INTRAMUSCULAR | Status: DC | PRN
  Administered 2019-11-19: 1 mg via INTRAVENOUS

## 2019-11-19 MED ORDER — ASCORBIC ACID 500 MG PO TABS
1000.0000 mg | ORAL_TABLET | Freq: Every day | ORAL | Status: DC
  Administered 2019-11-20 – 2019-11-23 (×4): 1000 mg via ORAL
  Filled 2019-11-19 (×5): qty 2

## 2019-11-19 SURGICAL SUPPLY — 20 items
CATH INFINITI 5FR JK (CATHETERS) ×1 IMPLANT
CATH LAUNCHER 5F AR1 (CATHETERS) ×1 IMPLANT
CATH LAUNCHER 5F JL4 (CATHETERS) IMPLANT
CATH LAUNCHER 5F NOTO (CATHETERS) IMPLANT
CATH LAUNCHER 6FR 3DRC (CATHETERS) IMPLANT
CATHETER LAUNCHER 5F JL4 (CATHETERS) ×2
CATHETER LAUNCHER 5F NOTO (CATHETERS) ×2
CATHETER LAUNCHER 6FR 3DRC (CATHETERS) ×2
DEVICE RAD COMP TR BAND LRG (VASCULAR PRODUCTS) ×1 IMPLANT
GLIDESHEATH SLEND SS 6F .021 (SHEATH) ×1 IMPLANT
GUIDEWIRE INQWIRE 1.5J.035X260 (WIRE) IMPLANT
INQWIRE 1.5J .035X260CM (WIRE) ×2
KIT ESSENTIALS PG (KITS) ×1 IMPLANT
KIT HEART LEFT (KITS) ×2 IMPLANT
PACK CARDIAC CATHETERIZATION (CUSTOM PROCEDURE TRAY) ×2 IMPLANT
TRANSDUCER W/STOPCOCK (MISCELLANEOUS) ×2 IMPLANT
TUBING ART PRESS 72  MALE/FEM (TUBING) ×2
TUBING ART PRESS 72 MALE/FEM (TUBING) IMPLANT
TUBING CIL FLEX 10 FLL-RA (TUBING) ×2 IMPLANT
WIRE RUNTHROUGH .014X180CM (WIRE) ×1 IMPLANT

## 2019-11-19 NOTE — Progress Notes (Signed)
Pt states she doen't want the dr to call anyone unless there is an emergency.

## 2019-11-19 NOTE — Interval H&P Note (Signed)
History and Physical Interval Note: Cath Lab Visit (complete for each Cath Lab visit)  Clinical Evaluation Leading to the Procedure:   ACS: No.  Non-ACS:    Anginal Classification: CCS III  Anti-ischemic medical therapy: Minimal Therapy (1 class of medications)  Non-Invasive Test Results: No non-invasive testing performed  Prior CABG: No previous CABG       11/19/2019 9:04 AM  Kimberly [name]  has presented today for surgery, with the diagnosis of chest pain.  The various methods of treatment have been discussed with the patient and family. After consideration of risks, benefits and other options for treatment, the patient has consented to  Procedure(s): LEFT HEART CATH AND CORONARY ANGIOGRAPHY (N/A) as a surgical intervention.  The patient's history has been reviewed, patient examined, no change in status, stable for surgery.  I have reviewed the patient's chart and labs.  Questions were answered to the patient's satisfaction.     Kathlyn Sacramento

## 2019-11-20 ENCOUNTER — Inpatient Hospital Stay (HOSPITAL_COMMUNITY): Payer: Medicare HMO

## 2019-11-20 DIAGNOSIS — R079 Chest pain, unspecified: Secondary | ICD-10-CM | POA: Diagnosis not present

## 2019-11-20 DIAGNOSIS — I2583 Coronary atherosclerosis due to lipid rich plaque: Secondary | ICD-10-CM

## 2019-11-20 DIAGNOSIS — I361 Nonrheumatic tricuspid (valve) insufficiency: Secondary | ICD-10-CM | POA: Diagnosis not present

## 2019-11-20 DIAGNOSIS — I10 Essential (primary) hypertension: Secondary | ICD-10-CM | POA: Diagnosis not present

## 2019-11-20 DIAGNOSIS — E7800 Pure hypercholesterolemia, unspecified: Secondary | ICD-10-CM

## 2019-11-20 DIAGNOSIS — I200 Unstable angina: Secondary | ICD-10-CM

## 2019-11-20 DIAGNOSIS — I2510 Atherosclerotic heart disease of native coronary artery without angina pectoris: Secondary | ICD-10-CM | POA: Diagnosis not present

## 2019-11-20 LAB — BASIC METABOLIC PANEL
Anion gap: 7 (ref 5–15)
BUN: 9 mg/dL (ref 8–23)
CO2: 25 mmol/L (ref 22–32)
Calcium: 8.5 mg/dL — ABNORMAL LOW (ref 8.9–10.3)
Chloride: 108 mmol/L (ref 98–111)
Creatinine, Ser: 0.93 mg/dL (ref 0.44–1.00)
GFR, Estimated: >60 mL/min (ref >60)
Glucose, Bld: 94 mg/dL (ref 70–99)
Potassium: 4 mmol/L (ref 3.5–5.1)
Sodium: 140 mmol/L (ref 135–145)

## 2019-11-20 LAB — ECHOCARDIOGRAM COMPLETE
Area-P 1/2: 2.42 cm2
Height: 64 in
S' Lateral: 2.9 cm
Weight: 1777.08 oz

## 2019-11-20 LAB — CBC
HCT: 37.7 % (ref 36.0–46.0)
Hemoglobin: 12.9 g/dL (ref 12.0–15.0)
MCH: 34.6 pg — ABNORMAL HIGH (ref 26.0–34.0)
MCHC: 34.2 g/dL (ref 30.0–36.0)
MCV: 101.1 fL — ABNORMAL HIGH (ref 80.0–100.0)
Platelets: 170 10*3/uL (ref 150–400)
RBC: 3.73 MIL/uL — ABNORMAL LOW (ref 3.87–5.11)
RDW: 13 % (ref 11.5–15.5)
WBC: 7 10*3/uL (ref 4.0–10.5)
nRBC: 0 % (ref 0.0–0.2)

## 2019-11-20 LAB — VITAMIN B12: Vitamin B-12: 410 pg/mL (ref 180–914)

## 2019-11-20 MED ORDER — VITAMIN B-12 1000 MCG PO TABS
1000.0000 ug | ORAL_TABLET | Freq: Every day | ORAL | Status: DC
  Administered 2019-11-21 – 2019-11-23 (×3): 1000 ug via ORAL
  Filled 2019-11-20 (×3): qty 1

## 2019-11-20 NOTE — Progress Notes (Signed)
Progress Note  Patient Name: Kimberly [name] Date of Encounter: 11/20/2019  [facility] Cardiologist: No primary care provider on file.   Subjective   Denies any chest pain or SOB.  S/P Cath yesterday with severe 1v CAD with 99% oRCA and 70% mRCA and normal LVF with moderate dz of the LAD and LCx.  Failed PCI of RCA due to inability to cannulate RCA.  Inpatient Medications    Scheduled Meds: . vitamin C  1,000 mg Oral Daily  . [START ON 11/21/2019] aspirin  162 mg Oral QODAY  . atorvastatin  20 mg Oral Q M,W,F  . cholecalciferol  2,000 Units Oral Daily  . clopidogrel  75 mg Oral QHS  . ferrous sulfate  325 mg Oral Q M,W,F  . sodium chloride flush  3 mL Intravenous Q12H  . vitamin B-12  5,000 mcg Oral Daily   Continuous Infusions: . sodium chloride     PRN Meds: sodium chloride, acetaminophen, albuterol, diphenhydrAMINE-zinc acetate, loratadine, ondansetron (ZOFRAN) IV, sodium chloride flush   Vital Signs    Vitals:   11/19/19 1938 11/19/19 2328 11/20/19 0531 11/20/19 0717  BP: (!) 100/58 102/68  127/66  Pulse:  70 72 72  Resp: 18 18 18 18   Temp: 98.7 F (37.1 C) 97.8 F (36.6 C) 98.7 F (37.1 C) 97.9 F (36.6 C)  TempSrc: Oral Oral Oral Oral  SpO2:  98% 98% 95%  Weight:   50.4 kg   Height:        Intake/Output Summary (Last 24 hours) at 11/20/2019 0858 Last data filed at 11/19/2019 1900 Gross per 24 hour  Intake 1100 ml  Output 200 ml  Net 900 ml   Last 3 Weights 11/20/2019 11/19/2019 11/19/2019  Weight (lbs) 111 lb 1.1 oz 114 lb 6.4 oz 117 lb 9.6 oz  Weight (kg) 50.38 kg 51.891 kg 53.343 kg      Telemetry    NSR - Personally Reviewed  ECG    No new EKG to review - Personally Reviewed  Physical Exam   GEN: No acute distress.   Neck: No JVD Cardiac: RRR, no murmurs, rubs, or gallops.  Respiratory: Clear to auscultation bilaterally. GI: Soft, nontender, non-distended  MS: No edema; No deformity. Neuro:  Nonfocal  Psych: Normal affect    Labs    High Sensitivity Troponin:   Recent Labs  Lab 10/27/19 2231 10/28/19 0040  TROPONINIHS 4 6      Chemistry Recent Labs  Lab 11/20/19 0149  NA 140  K 4.0  CL 108  CO2 25  GLUCOSE 94  BUN 9  CREATININE 0.93  CALCIUM 8.5*  GFRNONAA >60  ANIONGAP 7     Hematology Recent Labs  Lab 11/20/19 0149  WBC 7.0  RBC 3.73*  HGB 12.9  HCT 37.7  MCV 101.1*  MCH 34.6*  MCHC 34.2  RDW 13.0  PLT 170    BNPNo results for input(s): BNP, PROBNP in the last 168 hours.   DDimer No results for input(s): DDIMER in the last 168 hours.  Radiology    CARDIAC CATHETERIZATION  Result Date: 11/19/2019  Ost RCA to Prox RCA lesion is 99% stenosed.  Prox RCA to Mid RCA lesion is 70% stenosed.  The left ventricular systolic function is normal.  LV end diastolic pressure is normal.  The left ventricular ejection fraction is 55-65% by visual estimate.  Prox Cx lesion is 50% stenosed.  Dist Cx lesion is 30% stenosed.  Mid LAD lesion is 60%  stenosed.  2nd Diag lesion is 50% stenosed.  1.  Severe one-vessel coronary artery disease with 99% stenosis in ostial right coronary artery and likely 70% mid stenosis.  Moderate disease affecting the LAD and left circumflex. 2.  Normal LV systolic function and left ventricular end-diastolic pressure. 3.  Inability to cannulate the right coronary artery in spite of using multiple catheters due to severe ostial stenosis and a calcified ridge above the origin of the ostium.  Attempted wiring of the vessel from distance was also not successful.  Given that the patient was fully anticoagulated and I exceeded my contrast limits, I elected to abort the procedure with plans for attempted PCI via the femoral approach. Recommendations: Given chest pain at rest, will admit the patient to the hospital and hydrate post contrast exposure.  Plan for attempted RCA PCI via the femoral approach on Monday.    Cardiac Studies   Cardiac Cath 11/19/2019 Conclusion     Ost RCA to Prox RCA lesion is 99% stenosed.  Prox RCA to Mid RCA lesion is 70% stenosed.  The left ventricular systolic function is normal.  LV end diastolic pressure is normal.  The left ventricular ejection fraction is 55-65% by visual estimate.  Prox Cx lesion is 50% stenosed.  Dist Cx lesion is 30% stenosed.  Mid LAD lesion is 60% stenosed.  2nd Diag lesion is 50% stenosed.   1.  Severe one-vessel coronary artery disease with 99% stenosis in ostial right coronary artery and likely 70% mid stenosis.  Moderate disease affecting the LAD and left circumflex. 2.  Normal LV systolic function and left ventricular end-diastolic pressure. 3.  Inability to cannulate the right coronary artery in spite of using multiple catheters due to severe ostial stenosis and a calcified ridge above the origin of the ostium.  Attempted wiring of the vessel from distance was also not successful.  Given that the patient was fully anticoagulated and I exceeded my contrast limits, I elected to abort the procedure with plans for attempted PCI via the femoral approach.  Recommendations: Given chest pain at rest, will admit the patient to the hospital and hydrate post contrast exposure.  Plan for attempted RCA PCI via the femoral approach on Monday.   Patient Profile     69 y.o. female with a hx of HLD, HTN, SVC, PVD with previous left subclavian artery stent and left brachial artery angioplasty, right brachial artery embolectomy, previous left carotid endarterectomy and aortobifemoral bypass graft.  Most recent carotid Dopplers August 2021 showed 40 to 59% bilateral stenosis.  Dopplers November 2021 showed patent aortobifemoral bypass graft who was admitted with chest pain.    Assessment & Plan    1 chest pain -symptoms with both typical and atypical features.   -Previous electrocardiogram showed no ST changes and troponins normal.  -cath yesterday showed severe 1v CAD with 99% oRCA and 70% prox to  mid RCA, 50% pLCX, 30% dLCx, 60% mLAD and 50% D2 with normal LVF.  -s/p failed attempt at PCI of RCA due to inability to cannulate RCA due to severe ostial stenosis and a calcified ridge above the origin of the ostium.  -plan for PCI on Monday via femoral approach -continue ASA 81mg  daily, Plavix 75mg  daily and statin -SCr stable post cath at 0.93n and Hbg 12.9  2 peripheral vascular disease -followed by vascular surgery.   -Continue plavix and statin.  3 hyperlipidemia -she is on lower dose statin as she has not tolerated other statins in the  past.   -check lipids and if LDL greater than 70 add Zetia or Repatha.  4 hypertension -BP well controlled today at 127/67mmHg -he has not required antihypertensive meds  5 tobacco abuse -patient counseled on discontinuing.  I have spent a total of 35 minutes with patient reviewing cardiac cath , telemetry, EKGs, labs and examining patient as well as establishing an assessment and plan that was discussed with the patient.  > 50% of time was spent in direct patient care.    For questions or updates, please contact [place] Please consult [email or web address] for contact info under        Signed, Fransico Him, MD  11/20/2019, 8:58 AM

## 2019-11-20 NOTE — Progress Notes (Signed)
CARDIAC REHAB PHASE I   PRE:  Rate/Rhythm: 61 NSR    BP: sitting 128/54    SaO2: 96 RA  MODE:  Ambulation: 400 ft   POST:  Rate/Rhythm: 74 NSR    BP: sitting 151/68     SaO2: 98 RA  [id number] Patient ambulated in hallway with slow steady gait per request of Dr. Radford Pax. Patient instructed to notify CR RN if CP/pressure/SOB return. Patient denied complaints. Able to walk and talk without SOB. Post ambulation patient back to bedside sitting with family member. Call bell and phone in reach. Will follow up on Monday post staged PCI. Patient encouraged to ambulate in hallway independently as tolerated however to stop if symptoms occur.   Kimberly [name] Minus Breeding RN, BSN

## 2019-11-20 NOTE — Plan of Care (Signed)

## 2019-11-20 NOTE — Progress Notes (Signed)
  Echocardiogram 2D Echocardiogram has been performed.  Kimberly [name] 11/20/2019, 2:18 PM

## 2019-11-20 NOTE — Plan of Care (Signed)
  Problem: Education: Goal: Understanding of CV disease, CV risk reduction, and recovery process will improve 11/20/2019 0756 by Camillia Herter, RN Outcome: Progressing 11/20/2019 0734 by Camillia Herter, RN Outcome: Progressing Goal: Individualized Educational Video(s) 11/20/2019 0756 by Camillia Herter, RN Outcome: Progressing 11/20/2019 0734 by Camillia Herter, RN Outcome: Progressing   Problem: Activity: Goal: Ability to return to baseline activity level will improve 11/20/2019 0756 by Camillia Herter, RN Outcome: Progressing 11/20/2019 0734 by Camillia Herter, RN Outcome: Progressing   Problem: Cardiovascular: Goal: Ability to achieve and maintain adequate cardiovascular perfusion will improve 11/20/2019 0756 by Camillia Herter, RN Outcome: Progressing 11/20/2019 0734 by Camillia Herter, RN Outcome: Progressing Goal: Vascular access site(s) Level 0-1 will be maintained 11/20/2019 0756 by Camillia Herter, RN Outcome: Progressing 11/20/2019 0734 by Camillia Herter, RN Outcome: Progressing   Problem: Health Behavior/Discharge Planning: Goal: Ability to safely manage health-related needs after discharge will improve 11/20/2019 0756 by Camillia Herter, RN Outcome: Progressing 11/20/2019 0734 by Camillia Herter, RN Outcome: Progressing

## 2019-11-21 DIAGNOSIS — E7800 Pure hypercholesterolemia, unspecified: Secondary | ICD-10-CM | POA: Diagnosis not present

## 2019-11-21 DIAGNOSIS — I200 Unstable angina: Secondary | ICD-10-CM | POA: Diagnosis not present

## 2019-11-21 DIAGNOSIS — I10 Essential (primary) hypertension: Secondary | ICD-10-CM | POA: Diagnosis not present

## 2019-11-21 DIAGNOSIS — I2510 Atherosclerotic heart disease of native coronary artery without angina pectoris: Secondary | ICD-10-CM | POA: Diagnosis not present

## 2019-11-21 LAB — LIPID PANEL
Cholesterol: 210 mg/dL — ABNORMAL HIGH (ref 0–200)
HDL: 38 mg/dL — ABNORMAL LOW (ref >40)
LDL Cholesterol: 152 mg/dL — ABNORMAL HIGH (ref 0–99)
Total CHOL/HDL Ratio: 5.5 RATIO
Triglycerides: 100 mg/dL (ref <150)
VLDL: 20 mg/dL (ref 0–40)

## 2019-11-21 LAB — BASIC METABOLIC PANEL
Anion gap: 7 (ref 5–15)
BUN: 11 mg/dL (ref 8–23)
CO2: 28 mmol/L (ref 22–32)
Calcium: 9.1 mg/dL (ref 8.9–10.3)
Chloride: 103 mmol/L (ref 98–111)
Creatinine, Ser: 0.92 mg/dL (ref 0.44–1.00)
GFR, Estimated: >60 mL/min (ref >60)
Glucose, Bld: 93 mg/dL (ref 70–99)
Potassium: 3.9 mmol/L (ref 3.5–5.1)
Sodium: 138 mmol/L (ref 135–145)

## 2019-11-21 NOTE — Progress Notes (Addendum)
Progress Note  Patient Name: Kimberly [name] Date of Encounter: 11/21/2019  [facility] Cardiologist: No primary care provider on file.   Subjective    S/P Cath Friday with severe 1v CAD with 99% oRCA and 70% mRCA and normal LVF with moderate dz of the LAD and LCx.  Failed PCI of RCA due to inability to cannulate RCA.  Denies any further CP. Had some SOB this am and could not take a deep breath but resolved with Albuterol inhaler.   Inpatient Medications    Scheduled Meds: . vitamin C  1,000 mg Oral Daily  . aspirin  162 mg Oral QODAY  . atorvastatin  20 mg Oral Q M,W,F  . cholecalciferol  2,000 Units Oral Daily  . clopidogrel  75 mg Oral QHS  . ferrous sulfate  325 mg Oral Q M,W,F  . sodium chloride flush  3 mL Intravenous Q12H  . vitamin B-12  1,000 mcg Oral Daily   Continuous Infusions: . sodium chloride     PRN Meds: sodium chloride, acetaminophen, albuterol, diphenhydrAMINE-zinc acetate, loratadine, ondansetron (ZOFRAN) IV, sodium chloride flush   Vital Signs    Vitals:   11/20/19 2347 11/21/19 0422 11/21/19 0637 11/21/19 0730  BP: 115/68 91/71  (!) 121/59  Pulse:  68  70  Resp: 18 18  18   Temp: 98 F (36.7 C) 98 F (36.7 C)  98.8 F (37.1 C)  TempSrc: Oral Oral  Oral  SpO2:  98% 97% 98%  Weight:  49.9 kg    Height:        Intake/Output Summary (Last 24 hours) at 11/21/2019 0839 Last data filed at 11/20/2019 1824 Gross per 24 hour  Intake 520 ml  Output --  Net 520 ml   Last 3 Weights 11/21/2019 11/20/2019 11/19/2019  Weight (lbs) 109 lb 15.1 oz 111 lb 1.1 oz 114 lb 6.4 oz  Weight (kg) 49.87 kg 50.38 kg 51.891 kg      Telemetry    NSR- Personally Reviewed  ECG    No new EKG to review - Personally Reviewed  Physical Exam   GEN: Well nourished, well developed in no acute distress HEENT: Normal NECK: No JVD; No carotid bruits LYMPHATICS: No lymphadenopathy CARDIAC:RRR, no murmurs, rubs, gallops RESPIRATORY: decreased BS  throughtou ABDOMEN: Soft, non-tender, non-distended MUSCULOSKELETAL:  No edema; No deformity  SKIN: Warm and dry NEUROLOGIC:  Alert and oriented x 3 PSYCHIATRIC:  Normal affect    Labs    High Sensitivity Troponin:   Recent Labs  Lab 10/27/19 2231 10/28/19 0040  TROPONINIHS 4 6      Chemistry Recent Labs  Lab 11/20/19 0149  NA 140  K 4.0  CL 108  CO2 25  GLUCOSE 94  BUN 9  CREATININE 0.93  CALCIUM 8.5*  GFRNONAA >60  ANIONGAP 7     Hematology Recent Labs  Lab 11/20/19 0149  WBC 7.0  RBC 3.73*  HGB 12.9  HCT 37.7  MCV 101.1*  MCH 34.6*  MCHC 34.2  RDW 13.0  PLT 170    BNPNo results for input(s): BNP, PROBNP in the last 168 hours.   DDimer No results for input(s): DDIMER in the last 168 hours.  Radiology    CARDIAC CATHETERIZATION  Result Date: 11/19/2019  Ost RCA to Prox RCA lesion is 99% stenosed.  Prox RCA to Mid RCA lesion is 70% stenosed.  The left ventricular systolic function is normal.  LV end diastolic pressure is normal.  The left ventricular ejection  fraction is 55-65% by visual estimate.  Prox Cx lesion is 50% stenosed.  Dist Cx lesion is 30% stenosed.  Mid LAD lesion is 60% stenosed.  2nd Diag lesion is 50% stenosed.  1.  Severe one-vessel coronary artery disease with 99% stenosis in ostial right coronary artery and likely 70% mid stenosis.  Moderate disease affecting the LAD and left circumflex. 2.  Normal LV systolic function and left ventricular end-diastolic pressure. 3.  Inability to cannulate the right coronary artery in spite of using multiple catheters due to severe ostial stenosis and a calcified ridge above the origin of the ostium.  Attempted wiring of the vessel from distance was also not successful.  Given that the patient was fully anticoagulated and I exceeded my contrast limits, I elected to abort the procedure with plans for attempted PCI via the femoral approach. Recommendations: Given chest pain at rest, will admit the  patient to the hospital and hydrate post contrast exposure.  Plan for attempted RCA PCI via the femoral approach on Monday.   ECHOCARDIOGRAM COMPLETE  Result Date: 11/20/2019    ECHOCARDIOGRAM REPORT   Patient Name:   Kimberly [name] Date of Exam: 11/20/2019 Medical Rec #:  [id number]       Height:       64.0 in Accession #:    [id number]      Weight:       111.1 lb Date of Birth:  1950-05-20       BSA:          1.524 m Patient Age:    69 years        BP:           127/66 mmHg Patient Gender: F               HR:           54 bpm. Exam Location:  Inpatient Procedure: 2D Echo, Cardiac Doppler and Color Doppler Indications:    R07.9* Chest pain, unspecified  History:        Patient has prior history of Echocardiogram examinations, most                 recent 02/06/2011. Stroke, Signs/Symptoms:Chest Pain; Risk                 Factors:Hypertension and Dyslipidemia.  Sonographer:    Bernadene Person RDCS Referring Phys: Chambers  1. Left ventricular ejection fraction, by estimation, is 60 to 65%. The left ventricle has normal function. The left ventricle has no regional wall motion abnormalities. Left ventricular diastolic parameters are consistent with Grade I diastolic dysfunction (impaired relaxation).  2. Right ventricular systolic function is normal. The right ventricular size is normal. Tricuspid regurgitation signal is inadequate for assessing PA pressure.  3. The mitral valve is normal in structure. Trivial mitral valve regurgitation. No evidence of mitral stenosis.  4. The aortic valve is tricuspid. Aortic valve regurgitation is not visualized. Mild aortic valve sclerosis is present, with no evidence of aortic valve stenosis.  5. The inferior vena cava is normal in size with greater than 50% respiratory variability, suggesting right atrial pressure of 3 mmHg. FINDINGS  Left Ventricle: Left ventricular ejection fraction, by estimation, is 60 to 65%. The left ventricle has normal function.  The left ventricle has no regional wall motion abnormalities. The left ventricular internal cavity size was normal in size. There is  no left ventricular hypertrophy. Left ventricular diastolic parameters are consistent with  Grade I diastolic dysfunction (impaired relaxation). Right Ventricle: The right ventricular size is normal. Right ventricular systolic function is normal. Tricuspid regurgitation signal is inadequate for assessing PA pressure. The tricuspid regurgitant velocity is 2.54 m/s, and with an assumed right atrial  pressure of 3 mmHg, the estimated right ventricular systolic pressure is 09.6 mmHg. Left Atrium: Left atrial size was normal in size. Right Atrium: Right atrial size was normal in size. Pericardium: There is no evidence of pericardial effusion. Mitral Valve: The mitral valve is normal in structure. Trivial mitral valve regurgitation. No evidence of mitral valve stenosis. Tricuspid Valve: The tricuspid valve is normal in structure. Tricuspid valve regurgitation is mild . No evidence of tricuspid stenosis. Aortic Valve: The aortic valve is tricuspid. Aortic valve regurgitation is not visualized. Mild aortic valve sclerosis is present, with no evidence of aortic valve stenosis. Pulmonic Valve: The pulmonic valve was normal in structure. Pulmonic valve regurgitation is not visualized. No evidence of pulmonic stenosis. Aorta: The aortic root is normal in size and structure. Venous: The inferior vena cava is normal in size with greater than 50% respiratory variability, suggesting right atrial pressure of 3 mmHg.  LEFT VENTRICLE PLAX 2D LVIDd:         4.30 cm  Diastology LVIDs:         2.90 cm  LV e' medial:    8.27 cm/s LV PW:         0.80 cm  LV E/e' medial:  8.9 LV IVS:        0.80 cm  LV e' lateral:   7.51 cm/s LVOT diam:     1.70 cm  LV E/e' lateral: 9.8 LV SV:         58 LV SV Index:   38 LVOT Area:     2.27 cm  RIGHT VENTRICLE RV S prime:     12.00 cm/s TAPSE (M-mode): 2.0 cm LEFT ATRIUM              Index       RIGHT ATRIUM           Index LA diam:        2.80 cm 1.84 cm/m  RA Area:     11.10 cm LA Vol (A2C):   35.9 ml 23.56 ml/m RA Volume:   21.80 ml  14.31 ml/m LA Vol (A4C):   27.2 ml 17.85 ml/m LA Biplane Vol: 32.5 ml 21.33 ml/m  AORTIC VALVE LVOT Vmax:   126.00 cm/s LVOT Vmean:  78.000 cm/s LVOT VTI:    0.257 m  AORTA Ao Root diam: 2.70 cm Ao Asc diam:  2.70 cm MITRAL VALVE               TRICUSPID VALVE MV Area (PHT): 2.42 cm    TR Peak grad:   25.8 mmHg MV Decel Time: 313 msec    TR Vmax:        254.00 cm/s MV E velocity: 73.70 cm/s MV A velocity: 75.80 cm/s  SHUNTS MV E/A ratio:  0.97        Systemic VTI:  0.26 m                            Systemic Diam: 1.70 cm Kirk Ruths MD Electronically signed by Kirk Ruths MD Signature Date/Time: 11/20/2019/2:39:15 PM    Final     Cardiac Studies   Cardiac Cath 11/19/2019 Conclusion    Ost RCA to  Prox RCA lesion is 99% stenosed.  Prox RCA to Mid RCA lesion is 70% stenosed.  The left ventricular systolic function is normal.  LV end diastolic pressure is normal.  The left ventricular ejection fraction is 55-65% by visual estimate.  Prox Cx lesion is 50% stenosed.  Dist Cx lesion is 30% stenosed.  Mid LAD lesion is 60% stenosed.  2nd Diag lesion is 50% stenosed.   1.  Severe one-vessel coronary artery disease with 99% stenosis in ostial right coronary artery and likely 70% mid stenosis.  Moderate disease affecting the LAD and left circumflex. 2.  Normal LV systolic function and left ventricular end-diastolic pressure. 3.  Inability to cannulate the right coronary artery in spite of using multiple catheters due to severe ostial stenosis and a calcified ridge above the origin of the ostium.  Attempted wiring of the vessel from distance was also not successful.  Given that the patient was fully anticoagulated and I exceeded my contrast limits, I elected to abort the procedure with plans for attempted PCI via the femoral  approach.  Recommendations: Given chest pain at rest, will admit the patient to the hospital and hydrate post contrast exposure.  Plan for attempted RCA PCI via the femoral approach on Monday.   Patient Profile     69 y.o. female with a hx of HLD, HTN, SVC, PVD with previous left subclavian artery stent and left brachial artery angioplasty, right brachial artery embolectomy, previous left carotid endarterectomy and aortobifemoral bypass graft.  Most recent carotid Dopplers August 2021 showed 40 to 59% bilateral stenosis.  Dopplers November 2021 showed patent aortobifemoral bypass graft who was admitted with chest pain.    Assessment & Plan    1 chest pain -symptoms with both typical and atypical features.   -Previous electrocardiogram showed no ST changes and troponins normal.  -cath  showed severe 1v CAD with 99% oRCA and 70% prox to mid RCA, 50% pLCX, 30% dLCx, 60% mLAD and 50% D2 with normal LVF.  -s/p failed attempt at PCI of RCA due to inability to cannulate RCA due to severe ostial stenosis and a calcified ridge above the origin of the ostium.  -plan for PCI of RCA tomorrow via R femoral artery -continue ASA 81mg  daily, Plavix 75mg  daily and statin -SCr stable post cath at 0.93n and Hbg 12.9 yesterday  2 peripheral vascular disease -followed by vascular surgery.   -Continue plavix and statin.  3 hyperlipidemia -she is on lower dose statin as she has not tolerated other statins in the past.   -LDL 152 -doubt Zetia will get her to goal of < 70 so will need to be referred to lipid clinic for [place] on discharge  4 hypertension -BP well controlled today at 129/72mmHg -she has not required antihypertensive meds  5 tobacco abuse -patient counseled on discontinuing.    For questions or updates, please contact [place] Please consult [email or web address] for contact info under        Signed, Fransico Him, MD  11/21/2019, 8:39 AM

## 2019-11-21 NOTE — Plan of Care (Signed)

## 2019-11-22 ENCOUNTER — Encounter (HOSPITAL_COMMUNITY): Payer: Self-pay | Admitting: Internal Medicine

## 2019-11-22 ENCOUNTER — Encounter (HOSPITAL_COMMUNITY)
Admission: RE | Disposition: A | Discharge status: Home / Self Care | Payer: Self-pay | Source: Home / Self Care | Attending: Cardiovascular Disease

## 2019-11-22 DIAGNOSIS — E785 Hyperlipidemia, unspecified: Secondary | ICD-10-CM | POA: Diagnosis not present

## 2019-11-22 DIAGNOSIS — I200 Unstable angina: Secondary | ICD-10-CM | POA: Diagnosis not present

## 2019-11-22 DIAGNOSIS — I25110 Atherosclerotic heart disease of native coronary artery with unstable angina pectoris: Secondary | ICD-10-CM | POA: Diagnosis not present

## 2019-11-22 HISTORY — PX: CORONARY STENT INTERVENTION: CATH118234

## 2019-11-22 LAB — BASIC METABOLIC PANEL
Anion gap: 7 (ref 5–15)
BUN: 9 mg/dL (ref 8–23)
CO2: 28 mmol/L (ref 22–32)
Calcium: 9.2 mg/dL (ref 8.9–10.3)
Chloride: 104 mmol/L (ref 98–111)
Creatinine, Ser: 0.98 mg/dL (ref 0.44–1.00)
GFR, Estimated: >60 mL/min (ref >60)
Glucose, Bld: 88 mg/dL (ref 70–99)
Potassium: 3.9 mmol/L (ref 3.5–5.1)
Sodium: 139 mmol/L (ref 135–145)

## 2019-11-22 LAB — CREATININE, SERUM
Creatinine, Ser: 0.87 mg/dL (ref 0.44–1.00)
GFR, Estimated: >60 mL/min (ref >60)

## 2019-11-22 LAB — CBC
HCT: 41.6 % (ref 36.0–46.0)
Hemoglobin: 14.3 g/dL (ref 12.0–15.0)
MCH: 33.8 pg (ref 26.0–34.0)
MCHC: 34.4 g/dL (ref 30.0–36.0)
MCV: 98.3 fL (ref 80.0–100.0)
Platelets: 185 10*3/uL (ref 150–400)
RBC: 4.23 MIL/uL (ref 3.87–5.11)
RDW: 12.4 % (ref 11.5–15.5)
WBC: 8.4 10*3/uL (ref 4.0–10.5)
nRBC: 0 % (ref 0.0–0.2)

## 2019-11-22 LAB — POCT ACTIVATED CLOTTING TIME: Activated Clotting Time: 411 seconds

## 2019-11-22 SURGERY — CORONARY STENT INTERVENTION
Anesthesia: LOCAL

## 2019-11-22 MED ORDER — SODIUM CHLORIDE 0.9% FLUSH: 3.0000 mL | INTRAVENOUS | Status: DC | PRN

## 2019-11-22 MED ORDER — HEPARIN (PORCINE) IN NACL 1000-0.9 UT/500ML-% IV SOLN
INTRAVENOUS | Status: DC | PRN
  Administered 2019-11-22 (×2): 500 mL

## 2019-11-22 MED ORDER — SODIUM CHLORIDE 0.9 % IV SOLN: 250.0000 mL | INTRAVENOUS | Status: DC | PRN

## 2019-11-22 MED ORDER — MIDAZOLAM HCL 2 MG/2ML IJ SOLN
INTRAMUSCULAR | Status: AC
  Filled 2019-11-22: qty 2

## 2019-11-22 MED ORDER — SODIUM CHLORIDE 0.9% FLUSH
3.0000 mL | Freq: Two times a day (BID) | INTRAVENOUS | Status: DC
  Administered 2019-11-22 – 2019-11-23 (×2): 3 mL via INTRAVENOUS

## 2019-11-22 MED ORDER — FENTANYL CITRATE (PF) 100 MCG/2ML IJ SOLN
INTRAMUSCULAR | Status: AC
  Filled 2019-11-22: qty 2

## 2019-11-22 MED ORDER — SODIUM CHLORIDE 0.9 % IV SOLN
INTRAVENOUS | Status: AC | PRN
  Administered 2019-11-22: 250 mL via INTRAVENOUS

## 2019-11-22 MED ORDER — BIVALIRUDIN BOLUS VIA INFUSION - CUPID
INTRAVENOUS | Status: DC | PRN
  Administered 2019-11-22: 36.675 mg via INTRAVENOUS

## 2019-11-22 MED ORDER — NITROGLYCERIN 1 MG/10 ML FOR IR/CATH LAB
INTRA_ARTERIAL | Status: AC
  Filled 2019-11-22: qty 10

## 2019-11-22 MED ORDER — CLOPIDOGREL BISULFATE 300 MG PO TABS
ORAL_TABLET | ORAL | Status: DC | PRN
  Administered 2019-11-22: 300 mg via ORAL

## 2019-11-22 MED ORDER — BIVALIRUDIN TRIFLUOROACETATE 250 MG IV SOLR
INTRAVENOUS | Status: AC
  Filled 2019-11-22: qty 250

## 2019-11-22 MED ORDER — ASPIRIN 81 MG PO CHEW
81.0000 mg | CHEWABLE_TABLET | ORAL | Status: AC
  Administered 2019-11-22: 81 mg via ORAL
  Filled 2019-11-22: qty 1

## 2019-11-22 MED ORDER — ENOXAPARIN SODIUM 30 MG/0.3ML ~~LOC~~ SOLN
30.0000 mg | SUBCUTANEOUS | Status: DC
  Administered 2019-11-23: 30 mg via SUBCUTANEOUS
  Filled 2019-11-22: qty 0.3

## 2019-11-22 MED ORDER — HYDRALAZINE HCL 20 MG/ML IJ SOLN
INTRAMUSCULAR | Status: AC
  Filled 2019-11-22: qty 1

## 2019-11-22 MED ORDER — ALBUTEROL SULFATE HFA 108 (90 BASE) MCG/ACT IN AERS
1.0000 | INHALATION_SPRAY | Freq: Four times a day (QID) | RESPIRATORY_TRACT | Status: DC | PRN
  Administered 2019-11-23 (×2): 2 via RESPIRATORY_TRACT
  Filled 2019-11-22: qty 6.7

## 2019-11-22 MED ORDER — FENTANYL CITRATE (PF) 100 MCG/2ML IJ SOLN
INTRAMUSCULAR | Status: DC | PRN
  Administered 2019-11-22: 25 ug via INTRAVENOUS

## 2019-11-22 MED ORDER — SODIUM CHLORIDE 0.9 % IV SOLN
INTRAVENOUS | Status: DC | PRN
  Administered 2019-11-22: 1.75 mg/kg/h via INTRAVENOUS

## 2019-11-22 MED ORDER — IOHEXOL 350 MG/ML SOLN
INTRAVENOUS | Status: DC | PRN
  Administered 2019-11-22: 5 mL via INTRA_ARTERIAL
  Administered 2019-11-22: 125 mL via INTRA_ARTERIAL

## 2019-11-22 MED ORDER — SODIUM CHLORIDE 0.9 % WEIGHT BASED INFUSION
3.0000 mL/kg/h | INTRAVENOUS | Status: DC
  Administered 2019-11-22: 3 mL/kg/h via INTRAVENOUS

## 2019-11-22 MED ORDER — LABETALOL HCL 5 MG/ML IV SOLN: 10.0000 mg | INTRAVENOUS | Status: AC | PRN

## 2019-11-22 MED ORDER — NITROGLYCERIN 1 MG/10 ML FOR IR/CATH LAB
INTRA_ARTERIAL | Status: DC | PRN
  Administered 2019-11-22: 100 ug via INTRACORONARY
  Administered 2019-11-22 (×3): 200 ug via INTRACORONARY

## 2019-11-22 MED ORDER — SODIUM CHLORIDE 0.9% FLUSH
3.0000 mL | Freq: Two times a day (BID) | INTRAVENOUS | Status: DC
  Administered 2019-11-22: 3 mL via INTRAVENOUS

## 2019-11-22 MED ORDER — ANGIOPLASTY BOOK
Freq: Once | Status: AC
  Filled 2019-11-22: qty 1

## 2019-11-22 MED ORDER — HEPARIN SODIUM (PORCINE) 1000 UNIT/ML IJ SOLN
INTRAMUSCULAR | Status: AC
  Filled 2019-11-22: qty 1

## 2019-11-22 MED ORDER — HYDRALAZINE HCL 20 MG/ML IJ SOLN: 10.0000 mg | INTRAMUSCULAR | Status: AC | PRN

## 2019-11-22 MED ORDER — HEPARIN (PORCINE) IN NACL 1000-0.9 UT/500ML-% IV SOLN
INTRAVENOUS | Status: AC
  Filled 2019-11-22: qty 1000

## 2019-11-22 MED ORDER — CLOPIDOGREL BISULFATE 300 MG PO TABS
ORAL_TABLET | ORAL | Status: AC
  Filled 2019-11-22: qty 1

## 2019-11-22 MED ORDER — HYDRALAZINE HCL 20 MG/ML IJ SOLN
INTRAMUSCULAR | Status: DC | PRN
  Administered 2019-11-22: 5 mg via INTRAVENOUS

## 2019-11-22 MED ORDER — ASPIRIN EC 81 MG PO TBEC
81.0000 mg | DELAYED_RELEASE_TABLET | Freq: Every day | ORAL | Status: DC
  Administered 2019-11-23: 81 mg via ORAL
  Filled 2019-11-22: qty 1

## 2019-11-22 MED ORDER — SODIUM CHLORIDE 0.9 % WEIGHT BASED INFUSION: 1.0000 mL/kg/h | INTRAVENOUS | Status: DC

## 2019-11-22 MED ORDER — MIDAZOLAM HCL 2 MG/2ML IJ SOLN
INTRAMUSCULAR | Status: DC | PRN
  Administered 2019-11-22: 1 mg via INTRAVENOUS

## 2019-11-22 MED ORDER — SODIUM CHLORIDE 0.9 % IV SOLN: INTRAVENOUS | Status: AC

## 2019-11-22 MED ORDER — LIDOCAINE HCL (PF) 1 % IJ SOLN
INTRAMUSCULAR | Status: DC | PRN
  Administered 2019-11-22: 8 mL

## 2019-11-22 MED ORDER — LIDOCAINE HCL (PF) 1 % IJ SOLN
INTRAMUSCULAR | Status: AC
  Filled 2019-11-22: qty 30

## 2019-11-22 MED FILL — Nitroglycerin IV Soln 100 MCG/ML in D5W: INTRA_ARTERIAL | Qty: 10 | Status: AC

## 2019-11-22 SURGICAL SUPPLY — 19 items
BALLN EMERGE MR 2.25X12 (BALLOONS) ×2
BALLN SAPPHIRE ~~LOC~~ 2.75X12 (BALLOONS) ×1 IMPLANT
BALLN SAPPHIRE ~~LOC~~ 3.0X8 (BALLOONS) ×1 IMPLANT
BALLN WOLVERINE 2.50X10 (BALLOONS) ×2
BALLOON EMERGE MR 2.25X12 (BALLOONS) IMPLANT
BALLOON WOLVERINE 2.50X10 (BALLOONS) IMPLANT
CATH VISTA GUIDE 6FR JR4 (CATHETERS) ×1 IMPLANT
KIT ENCORE 26 ADVANTAGE (KITS) ×1 IMPLANT
KIT HEART LEFT (KITS) ×2 IMPLANT
KIT MICROPUNCTURE NIT STIFF (SHEATH) ×1 IMPLANT
PACK CARDIAC CATHETERIZATION (CUSTOM PROCEDURE TRAY) ×2 IMPLANT
SHEATH PINNACLE 6F 10CM (SHEATH) ×1 IMPLANT
SHEATH PROBE COVER 6X72 (BAG) ×1 IMPLANT
STENT RESOLUTE ONYX 2.5X18 (Permanent Stent) ×1 IMPLANT
STENT RESOLUTE ONYX 2.75X12 (Permanent Stent) ×1 IMPLANT
TRANSDUCER W/STOPCOCK (MISCELLANEOUS) ×2 IMPLANT
TUBING CIL FLEX 10 FLL-RA (TUBING) ×2 IMPLANT
WIRE EMERALD 3MM-J .035X150CM (WIRE) ×1 IMPLANT
WIRE RUNTHROUGH .014X180CM (WIRE) ×1 IMPLANT

## 2019-11-22 NOTE — Progress Notes (Signed)
   Digital images reviewed.  Nice result on the mid and ostial RCA.  Access site is without evidence of bleeding.  Discharge in a.m. if no groin bleeding or other complications.  So far she is very stable.

## 2019-11-22 NOTE — Interval H&P Note (Signed)
History and Physical Interval Note:  11/22/2019 10:47 AM  Kimberly [name]  has presented today for surgery, with the diagnosis of CAD.  The various methods of treatment have been discussed with the patient and family. After consideration of risks, benefits and other options for treatment, the patient has consented to  Procedure(s): CORONARY STENT INTERVENTION (N/A) as a surgical intervention.  The patient's history has been reviewed, patient examined, no change in status, stable for surgery.  I have reviewed the patient's chart and labs.  Questions were answered to the patient's satisfaction.    Cath Lab Visit (complete for each Cath Lab visit)  Clinical Evaluation Leading to the Procedure:   ACS: No.  Non-ACS:    Anginal Classification: CCS III  Anti-ischemic medical therapy: Minimal Therapy (1 class of medications)  Non-Invasive Test Results: No non-invasive testing performed  Prior CABG: No previous CABG  Kimberly [name]

## 2019-11-22 NOTE — Progress Notes (Signed)
Site area: right groin  Site Prior to Removal:  Level 0  Pressure Applied For 20 MINUTES    Minutes Beginning at 1625  Manual:   Yes.    Patient Status During Pull:  stable  Post Pull Groin Site:  Level 0  Post Pull Instructions Given:  Yes.    Post Pull Pulses Present:  Yes.    Dressing Applied:  Yes.    Comments:  Bedrest started at 75

## 2019-11-22 NOTE — Brief Op Note (Signed)
BRIEF CARDIAC CATHETERIZATION NOTE  11/22/2019  12:14 PM  PATIENT:  Kimberly [name]  69 y.o. female  PRE-OPERATIVE DIAGNOSIS:  CAD  POST-OPERATIVE DIAGNOSIS:  Same  PROCEDURE:  Procedure(s): CORONARY STENT INTERVENTION (N/A)  SURGEON:  Surgeon(s) and Role:    * Sherwood Castilla, Harrell Gave, MD - Primary  FINDINGS: 1. Severe ostial and mid RCA stenoses. 2. Low LVEDP. 3. Successful PCI to ostial RCA using Resolute Onyx 2.75 x 12 mm DES. 4. Successful PCI to mid RCA using Resolute Onyx 2.5 x 18 mm DES.  RECOMMENDATIONS: 1. Remove RFA sheath in 2 hours. 2. Continue indefinite ASA and clopidogrel. 3. Aggressive secondary prevention.  Nelva Bush, MD [facility]

## 2019-11-22 NOTE — H&P (View-Only) (Signed)
The patient has been seen in conjunction with Almyra Deforest, PAC. All aspects of care have been considered and discussed. The patient has been personally interviewed, examined, and all clinical data has been reviewed.   She is awaiting PCI of the right coronary later today.  Colleague was unable to cannulate the right coronary from the radial approach on Friday.  She has been stable over the weekend.  Kidney function is stable.  PCI later this morning by Dr. Saunders Revel from femoral approach.  All questions answered.   Progress Note  Patient Name: Kimberly [name] Date of Encounter: 11/22/2019  [facility] Cardiologist: Kirk Ruths, MD   Subjective   Denies any chest pain, occasional dyspnea  Inpatient Medications    Scheduled Meds: . vitamin C  1,000 mg Oral Daily  . aspirin  162 mg Oral QODAY  . atorvastatin  20 mg Oral Q M,W,F  . cholecalciferol  2,000 Units Oral Daily  . clopidogrel  75 mg Oral QHS  . ferrous sulfate  325 mg Oral Q M,W,F  . sodium chloride flush  3 mL Intravenous Q12H  . sodium chloride flush  3 mL Intravenous Q12H  . vitamin B-12  1,000 mcg Oral Daily   Continuous Infusions: . sodium chloride    . sodium chloride    . sodium chloride 1 mL/kg/hr (11/22/19 0525)   PRN Meds: sodium chloride, sodium chloride, acetaminophen, albuterol, diphenhydrAMINE-zinc acetate, loratadine, ondansetron (ZOFRAN) IV, sodium chloride flush, sodium chloride flush   Vital Signs    Vitals:   11/21/19 0730 11/21/19 1955 11/22/19 0000 11/22/19 0352  BP: (!) 121/59 (!) 156/65 (!) 148/98   Pulse: 70 70 62 62  Resp: 18 18 18 18   Temp: 98.8 F (37.1 C) 98.7 F (37.1 C) 98.7 F (37.1 C) 98 F (36.7 C)  TempSrc: Oral Oral Oral Oral  SpO2: 98% 99% 98% 99%  Weight:    48.9 kg  Height:        Intake/Output Summary (Last 24 hours) at 11/22/2019 0835 Last data filed at 11/21/2019 1801 Gross per 24 hour  Intake 120 ml  Output --  Net 120 ml   Last 3 Weights 11/22/2019  11/21/2019 11/20/2019  Weight (lbs) 107 lb 11.8 oz 109 lb 15.1 oz 111 lb 1.1 oz  Weight (kg) 48.87 kg 49.87 kg 50.38 kg      Telemetry    NSR without significant ventricular ectopy - Personally Reviewed  ECG    NSR with poor R wave progression in the anterior leads - Personally Reviewed  Physical Exam   GEN: No acute distress.   Neck: No JVD Cardiac: RRR, no murmurs, rubs, or gallops.  Respiratory: Clear to auscultation bilaterally. GI: Soft, nontender, non-distended  MS: No edema; No deformity. Neuro:  Nonfocal  Psych: Normal affect   Labs    High Sensitivity Troponin:   Recent Labs  Lab 10/27/19 2231 10/28/19 0040  TROPONINIHS 4 6      Chemistry Recent Labs  Lab 11/20/19 0149 11/21/19 0312 11/22/19 0433  NA 140 138 139  K 4.0 3.9 3.9  CL 108 103 104  CO2 25 28 28   GLUCOSE 94 93 88  BUN 9 11 9   CREATININE 0.93 0.92 0.98  CALCIUM 8.5* 9.1 9.2  GFRNONAA >60 >60 >60  ANIONGAP 7 7 7      Hematology Recent Labs  Lab 11/20/19 0149  WBC 7.0  RBC 3.73*  HGB 12.9  HCT 37.7  MCV 101.1*  MCH 34.6*  MCHC 34.2  RDW 13.0  PLT 170    BNPNo results for input(s): BNP, PROBNP in the last 168 hours.   DDimer No results for input(s): DDIMER in the last 168 hours.   Radiology    ECHOCARDIOGRAM COMPLETE  Result Date: 11/20/2019    ECHOCARDIOGRAM REPORT   Patient Name:   Kimberly [name] Date of Exam: 11/20/2019 Medical Rec #:  [id number]       Height:       64.0 in Accession #:    [id number]      Weight:       111.1 lb Date of Birth:  Nov 26, 1950       BSA:          1.524 m Patient Age:    77 years        BP:           127/66 mmHg Patient Gender: F               HR:           54 bpm. Exam Location:  Inpatient Procedure: 2D Echo, Cardiac Doppler and Color Doppler Indications:    R07.9* Chest pain, unspecified  History:        Patient has prior history of Echocardiogram examinations, most                 recent 02/06/2011. Stroke, Signs/Symptoms:Chest Pain; Risk                  Factors:Hypertension and Dyslipidemia.  Sonographer:    Bernadene Person RDCS Referring Phys: [place]  1. Left ventricular ejection fraction, by estimation, is 60 to 65%. The left ventricle has normal function. The left ventricle has no regional wall motion abnormalities. Left ventricular diastolic parameters are consistent with Grade I diastolic dysfunction (impaired relaxation).  2. Right ventricular systolic function is normal. The right ventricular size is normal. Tricuspid regurgitation signal is inadequate for assessing PA pressure.  3. The mitral valve is normal in structure. Trivial mitral valve regurgitation. No evidence of mitral stenosis.  4. The aortic valve is tricuspid. Aortic valve regurgitation is not visualized. Mild aortic valve sclerosis is present, with no evidence of aortic valve stenosis.  5. The inferior vena cava is normal in size with greater than 50% respiratory variability, suggesting right atrial pressure of 3 mmHg. FINDINGS  Left Ventricle: Left ventricular ejection fraction, by estimation, is 60 to 65%. The left ventricle has normal function. The left ventricle has no regional wall motion abnormalities. The left ventricular internal cavity size was normal in size. There is  no left ventricular hypertrophy. Left ventricular diastolic parameters are consistent with Grade I diastolic dysfunction (impaired relaxation). Right Ventricle: The right ventricular size is normal. Right ventricular systolic function is normal. Tricuspid regurgitation signal is inadequate for assessing PA pressure. The tricuspid regurgitant velocity is 2.54 m/s, and with an assumed right atrial  pressure of 3 mmHg, the estimated right ventricular systolic pressure is 26.8 mmHg. Left Atrium: Left atrial size was normal in size. Right Atrium: Right atrial size was normal in size. Pericardium: There is no evidence of pericardial effusion. Mitral Valve: The mitral valve is normal in  structure. Trivial mitral valve regurgitation. No evidence of mitral valve stenosis. Tricuspid Valve: The tricuspid valve is normal in structure. Tricuspid valve regurgitation is mild . No evidence of tricuspid stenosis. Aortic Valve: The aortic valve is tricuspid. Aortic valve regurgitation is not visualized. Mild aortic valve  sclerosis is present, with no evidence of aortic valve stenosis. Pulmonic Valve: The pulmonic valve was normal in structure. Pulmonic valve regurgitation is not visualized. No evidence of pulmonic stenosis. Aorta: The aortic root is normal in size and structure. Venous: The inferior vena cava is normal in size with greater than 50% respiratory variability, suggesting right atrial pressure of 3 mmHg.  LEFT VENTRICLE PLAX 2D LVIDd:         4.30 cm  Diastology LVIDs:         2.90 cm  LV e' medial:    8.27 cm/s LV PW:         0.80 cm  LV E/e' medial:  8.9 LV IVS:        0.80 cm  LV e' lateral:   7.51 cm/s LVOT diam:     1.70 cm  LV E/e' lateral: 9.8 LV SV:         58 LV SV Index:   38 LVOT Area:     2.27 cm  RIGHT VENTRICLE RV S prime:     12.00 cm/s TAPSE (M-mode): 2.0 cm LEFT ATRIUM             Index       RIGHT ATRIUM           Index LA diam:        2.80 cm 1.84 cm/m  RA Area:     11.10 cm LA Vol (A2C):   35.9 ml 23.56 ml/m RA Volume:   21.80 ml  14.31 ml/m LA Vol (A4C):   27.2 ml 17.85 ml/m LA Biplane Vol: 32.5 ml 21.33 ml/m  AORTIC VALVE LVOT Vmax:   126.00 cm/s LVOT Vmean:  78.000 cm/s LVOT VTI:    0.257 m  AORTA Ao Root diam: 2.70 cm Ao Asc diam:  2.70 cm MITRAL VALVE               TRICUSPID VALVE MV Area (PHT): 2.42 cm    TR Peak grad:   25.8 mmHg MV Decel Time: 313 msec    TR Vmax:        254.00 cm/s MV E velocity: 73.70 cm/s MV A velocity: 75.80 cm/s  SHUNTS MV E/A ratio:  0.97        Systemic VTI:  0.26 m                            Systemic Diam: 1.70 cm Kirk Ruths MD Electronically signed by Kirk Ruths MD Signature Date/Time: 11/20/2019/2:39:15 PM    Final      Cardiac Studies   Cath 11/19/2019  Ost RCA to Prox RCA lesion is 99% stenosed.  Prox RCA to Mid RCA lesion is 70% stenosed.  The left ventricular systolic function is normal.  LV end diastolic pressure is normal.  The left ventricular ejection fraction is 55-65% by visual estimate.  Prox Cx lesion is 50% stenosed.  Dist Cx lesion is 30% stenosed.  Mid LAD lesion is 60% stenosed.  2nd Diag lesion is 50% stenosed.   1.  Severe one-vessel coronary artery disease with 99% stenosis in ostial right coronary artery and likely 70% mid stenosis.  Moderate disease affecting the LAD and left circumflex. 2.  Normal LV systolic function and left ventricular end-diastolic pressure. 3.  Inability to cannulate the right coronary artery in spite of using multiple catheters due to severe ostial stenosis and a calcified ridge above the origin of the  ostium.  Attempted wiring of the vessel from distance was also not successful.  Given that the patient was fully anticoagulated and I exceeded my contrast limits, I elected to abort the procedure with plans for attempted PCI via the femoral approach.  Recommendations: Given chest pain at rest, will admit the patient to the hospital and hydrate post contrast exposure.  Plan for attempted RCA PCI via the femoral approach on Monday.    Echo 11/20/2019 IMPRESSIONS  1. Left ventricular ejection fraction, by estimation, is 60 to 65%. The  left ventricle has normal function. The left ventricle has no regional  wall motion abnormalities. Left ventricular diastolic parameters are  consistent with Grade I diastolic  dysfunction (impaired relaxation).  2. Right ventricular systolic function is normal. The right ventricular  size is normal. Tricuspid regurgitation signal is inadequate for assessing  PA pressure.  3. The mitral valve is normal in structure. Trivial mitral valve  regurgitation. No evidence of mitral stenosis.  4. The aortic valve is  tricuspid. Aortic valve regurgitation is not  visualized. Mild aortic valve sclerosis is present, with no evidence of  aortic valve stenosis.  5. The inferior vena cava is normal in size with greater than 50%  respiratory variability, suggesting right atrial pressure of 3 mmHg.     Patient Profile     69 y.o. female with PMH of PAD (L subclavian artery stent, L brachial artery angioplasty, right brachial artery embolectomy and aortobifemoral bypass graft), carotid artery disease s/p previous left carotid endarterectomy presented with chest pain.   Assessment & Plan    1. Chest pain  - cath 11/19/2019 99% ostial RCA, 70% mRCA and moderate LAD and LCx disease. Failed PCI of RCA due to inability to cannulate.   - plan for PCI of RCA today by Dr. Saunders Revel.   - Risk and benefit of procedure explained to the patient who display clear understanding and agree to proceed. Discussed with patient possible procedural risk include bleeding, vascular injury, renal injury, arrythmia, MI, stroke and loss of limb or life.  - change aspirin from 162 mg every other day to 81mg  every day. (she would rather cut the 325mg  aspirin into quarters at home due to cost saving.   2. PAD: on plavix daily and 162mg  aspirin every other day. Changing aspirin to daily.   3. HTN: despite diagnosis of hypertension, she is not on any antihypertensive. No BB given baseline HR of 50s  4. HLD: intolerant to statins, on 20mg  every other day of lipitor, plan to refer to lipid clinic for PCSK9 inhibitor after discharge.   5. Tobacco abuse  For questions or updates, please contact [place] Please consult [email or web address] for contact info under        Signed, Almyra Deforest, [place]  11/22/2019, 8:35 AM

## 2019-11-22 NOTE — Progress Notes (Addendum)
The patient has been seen in conjunction with Almyra Deforest, PAC. All aspects of care have been considered and discussed. The patient has been personally interviewed, examined, and all clinical data has been reviewed.   She is awaiting PCI of the right coronary later today.  Colleague was unable to cannulate the right coronary from the radial approach on Friday.  She has been stable over the weekend.  Kidney function is stable.  PCI later this morning by Dr. Saunders Revel from femoral approach.  All questions answered.   Progress Note  Patient Name: Kimberly [name] Date of Encounter: 11/22/2019  [facility] Cardiologist: Kirk Ruths, MD   Subjective   Denies any chest pain, occasional dyspnea  Inpatient Medications    Scheduled Meds: . vitamin C  1,000 mg Oral Daily  . aspirin  162 mg Oral QODAY  . atorvastatin  20 mg Oral Q M,W,F  . cholecalciferol  2,000 Units Oral Daily  . clopidogrel  75 mg Oral QHS  . ferrous sulfate  325 mg Oral Q M,W,F  . sodium chloride flush  3 mL Intravenous Q12H  . sodium chloride flush  3 mL Intravenous Q12H  . vitamin B-12  1,000 mcg Oral Daily   Continuous Infusions: . sodium chloride    . sodium chloride    . sodium chloride 1 mL/kg/hr (11/22/19 0525)   PRN Meds: sodium chloride, sodium chloride, acetaminophen, albuterol, diphenhydrAMINE-zinc acetate, loratadine, ondansetron (ZOFRAN) IV, sodium chloride flush, sodium chloride flush   Vital Signs    Vitals:   11/21/19 0730 11/21/19 1955 11/22/19 0000 11/22/19 0352  BP: (!) 121/59 (!) 156/65 (!) 148/98   Pulse: 70 70 62 62  Resp: 18 18 18 18   Temp: 98.8 F (37.1 C) 98.7 F (37.1 C) 98.7 F (37.1 C) 98 F (36.7 C)  TempSrc: Oral Oral Oral Oral  SpO2: 98% 99% 98% 99%  Weight:    48.9 kg  Height:        Intake/Output Summary (Last 24 hours) at 11/22/2019 0835 Last data filed at 11/21/2019 1801 Gross per 24 hour  Intake 120 ml  Output -  Net 120 ml   Last 3 Weights 11/22/2019  11/21/2019 11/20/2019  Weight (lbs) 107 lb 11.8 oz 109 lb 15.1 oz 111 lb 1.1 oz  Weight (kg) 48.87 kg 49.87 kg 50.38 kg      Telemetry    NSR without significant ventricular ectopy - Personally Reviewed  ECG    NSR with poor R wave progression in the anterior leads - Personally Reviewed  Physical Exam   GEN: No acute distress.   Neck: No JVD Cardiac: RRR, no murmurs, rubs, or gallops.  Respiratory: Clear to auscultation bilaterally. GI: Soft, nontender, non-distended  MS: No edema; No deformity. Neuro:  Nonfocal  Psych: Normal affect   Labs    High Sensitivity Troponin:   Recent Labs  Lab 10/27/19 2231 10/28/19 0040  TROPONINIHS 4 6      Chemistry Recent Labs  Lab 11/20/19 0149 11/21/19 0312 11/22/19 0433  NA 140 138 139  K 4.0 3.9 3.9  CL 108 103 104  CO2 25 28 28   GLUCOSE 94 93 88  BUN 9 11 9   CREATININE 0.93 0.92 0.98  CALCIUM 8.5* 9.1 9.2  GFRNONAA >60 >60 >60  ANIONGAP 7 7 7      Hematology Recent Labs  Lab 11/20/19 0149  WBC 7.0  RBC 3.73*  HGB 12.9  HCT 37.7  MCV 101.1*  MCH 34.6*  MCHC 34.2  RDW 13.0  PLT 170    BNPNo results for input(s): BNP, PROBNP in the last 168 hours.   DDimer No results for input(s): DDIMER in the last 168 hours.   Radiology    ECHOCARDIOGRAM COMPLETE  Result Date: 11/20/2019    ECHOCARDIOGRAM REPORT   Patient Name:   Kimberly [name] Date of Exam: 11/20/2019 Medical Rec #:  [id number]       Height:       64.0 in Accession #:    [id number]      Weight:       111.1 lb Date of Birth:  [birth year]       BSA:          1.524 m Patient Age:    69 years        BP:           127/66 mmHg Patient Gender: F               HR:           54 bpm. Exam Location:  Inpatient Procedure: 2D Echo, Cardiac Doppler and Color Doppler Indications:    R07.9* Chest pain, unspecified  History:        Patient has prior history of Echocardiogram examinations, most                 recent 02/06/2011. Stroke, Signs/Symptoms:Chest Pain; Risk                  Factors:Hypertension and Dyslipidemia.  Sonographer:    Bernadene Person RDCS Referring Phys: [place]  1. Left ventricular ejection fraction, by estimation, is 60 to 65%. The left ventricle has normal function. The left ventricle has no regional wall motion abnormalities. Left ventricular diastolic parameters are consistent with Grade I diastolic dysfunction (impaired relaxation).  2. Right ventricular systolic function is normal. The right ventricular size is normal. Tricuspid regurgitation signal is inadequate for assessing PA pressure.  3. The mitral valve is normal in structure. Trivial mitral valve regurgitation. No evidence of mitral stenosis.  4. The aortic valve is tricuspid. Aortic valve regurgitation is not visualized. Mild aortic valve sclerosis is present, with no evidence of aortic valve stenosis.  5. The inferior vena cava is normal in size with greater than 50% respiratory variability, suggesting right atrial pressure of 3 mmHg. FINDINGS  Left Ventricle: Left ventricular ejection fraction, by estimation, is 60 to 65%. The left ventricle has normal function. The left ventricle has no regional wall motion abnormalities. The left ventricular internal cavity size was normal in size. There is  no left ventricular hypertrophy. Left ventricular diastolic parameters are consistent with Grade I diastolic dysfunction (impaired relaxation). Right Ventricle: The right ventricular size is normal. Right ventricular systolic function is normal. Tricuspid regurgitation signal is inadequate for assessing PA pressure. The tricuspid regurgitant velocity is 2.54 m/s, and with an assumed right atrial  pressure of 3 mmHg, the estimated right ventricular systolic pressure is 96.2 mmHg. Left Atrium: Left atrial size was normal in size. Right Atrium: Right atrial size was normal in size. Pericardium: There is no evidence of pericardial effusion. Mitral Valve: The mitral valve is normal in  structure. Trivial mitral valve regurgitation. No evidence of mitral valve stenosis. Tricuspid Valve: The tricuspid valve is normal in structure. Tricuspid valve regurgitation is mild . No evidence of tricuspid stenosis. Aortic Valve: The aortic valve is tricuspid. Aortic valve regurgitation is not visualized. Mild aortic valve  sclerosis is present, with no evidence of aortic valve stenosis. Pulmonic Valve: The pulmonic valve was normal in structure. Pulmonic valve regurgitation is not visualized. No evidence of pulmonic stenosis. Aorta: The aortic root is normal in size and structure. Venous: The inferior vena cava is normal in size with greater than 50% respiratory variability, suggesting right atrial pressure of 3 mmHg.  LEFT VENTRICLE PLAX 2D LVIDd:         4.30 cm  Diastology LVIDs:         2.90 cm  LV e' medial:    8.27 cm/s LV PW:         0.80 cm  LV E/e' medial:  8.9 LV IVS:        0.80 cm  LV e' lateral:   7.51 cm/s LVOT diam:     1.70 cm  LV E/e' lateral: 9.8 LV SV:         58 LV SV Index:   38 LVOT Area:     2.27 cm  RIGHT VENTRICLE RV S prime:     12.00 cm/s TAPSE (M-mode): 2.0 cm LEFT ATRIUM             Index       RIGHT ATRIUM           Index LA diam:        2.80 cm 1.84 cm/m  RA Area:     11.10 cm LA Vol (A2C):   35.9 ml 23.56 ml/m RA Volume:   21.80 ml  14.31 ml/m LA Vol (A4C):   27.2 ml 17.85 ml/m LA Biplane Vol: 32.5 ml 21.33 ml/m  AORTIC VALVE LVOT Vmax:   126.00 cm/s LVOT Vmean:  78.000 cm/s LVOT VTI:    0.257 m  AORTA Ao Root diam: 2.70 cm Ao Asc diam:  2.70 cm MITRAL VALVE               TRICUSPID VALVE MV Area (PHT): 2.42 cm    TR Peak grad:   25.8 mmHg MV Decel Time: 313 msec    TR Vmax:        254.00 cm/s MV E velocity: 73.70 cm/s MV A velocity: 75.80 cm/s  SHUNTS MV E/A ratio:  0.97        Systemic VTI:  0.26 m                            Systemic Diam: 1.70 cm Kirk Ruths MD Electronically signed by Kirk Ruths MD Signature Date/Time: 11/20/2019/2:39:15 PM    Final      Cardiac Studies   Cath 11/19/2019  Ost RCA to Prox RCA lesion is 99% stenosed.  Prox RCA to Mid RCA lesion is 70% stenosed.  The left ventricular systolic function is normal.  LV end diastolic pressure is normal.  The left ventricular ejection fraction is 55-65% by visual estimate.  Prox Cx lesion is 50% stenosed.  Dist Cx lesion is 30% stenosed.  Mid LAD lesion is 60% stenosed.  2nd Diag lesion is 50% stenosed.   1.  Severe one-vessel coronary artery disease with 99% stenosis in ostial right coronary artery and likely 70% mid stenosis.  Moderate disease affecting the LAD and left circumflex. 2.  Normal LV systolic function and left ventricular end-diastolic pressure. 3.  Inability to cannulate the right coronary artery in spite of using multiple catheters due to severe ostial stenosis and a calcified ridge above the origin of the  ostium.  Attempted wiring of the vessel from distance was also not successful.  Given that the patient was fully anticoagulated and I exceeded my contrast limits, I elected to abort the procedure with plans for attempted PCI via the femoral approach.  Recommendations: Given chest pain at rest, will admit the patient to the hospital and hydrate post contrast exposure.  Plan for attempted RCA PCI via the femoral approach on Monday.    Echo 11/20/2019 IMPRESSIONS  1. Left ventricular ejection fraction, by estimation, is 60 to 65%. The  left ventricle has normal function. The left ventricle has no regional  wall motion abnormalities. Left ventricular diastolic parameters are  consistent with Grade I diastolic  dysfunction (impaired relaxation).  2. Right ventricular systolic function is normal. The right ventricular  size is normal. Tricuspid regurgitation signal is inadequate for assessing  PA pressure.  3. The mitral valve is normal in structure. Trivial mitral valve  regurgitation. No evidence of mitral stenosis.  4. The aortic valve is  tricuspid. Aortic valve regurgitation is not  visualized. Mild aortic valve sclerosis is present, with no evidence of  aortic valve stenosis.  5. The inferior vena cava is normal in size with greater than 50%  respiratory variability, suggesting right atrial pressure of 3 mmHg.     Patient Profile     69 y.o. female with PMH of PAD (L subclavian artery stent, L brachial artery angioplasty, right brachial artery embolectomy and aortobifemoral bypass graft), carotid artery disease s/p previous left carotid endarterectomy presented with chest pain.   Assessment & Plan    1. Chest pain  - cath 11/19/2019 99% ostial RCA, 70% mRCA and moderate LAD and LCx disease. Failed PCI of RCA due to inability to cannulate.   - plan for PCI of RCA today by Dr. Saunders Revel.   - Risk and benefit of procedure explained to the patient who display clear understanding and agree to proceed. Discussed with patient possible procedural risk include bleeding, vascular injury, renal injury, arrythmia, MI, stroke and loss of limb or life.  - change aspirin from 162 mg every other day to 81mg  every day. (she would rather cut the 325mg  aspirin into quarters at home due to cost saving.   2. PAD: on plavix daily and 162mg  aspirin every other day. Changing aspirin to daily.   3. HTN: despite diagnosis of hypertension, she is not on any antihypertensive. No BB given baseline HR of 50s  4. HLD: intolerant to statins, on 20mg  every other day of lipitor, plan to refer to lipid clinic for PCSK9 inhibitor after discharge.   5. Tobacco abuse  For questions or updates, please contact [place] Please consult [email or web address] for contact info under        Signed, Almyra Deforest, [place]  11/22/2019, 8:35 AM

## 2019-11-23 ENCOUNTER — Encounter (HOSPITAL_COMMUNITY): Payer: Self-pay | Admitting: Cardiovascular Disease

## 2019-11-23 DIAGNOSIS — F17200 Nicotine dependence, unspecified, uncomplicated: Secondary | ICD-10-CM

## 2019-11-23 DIAGNOSIS — I10 Essential (primary) hypertension: Secondary | ICD-10-CM | POA: Diagnosis not present

## 2019-11-23 DIAGNOSIS — I739 Peripheral vascular disease, unspecified: Secondary | ICD-10-CM | POA: Diagnosis not present

## 2019-11-23 DIAGNOSIS — I200 Unstable angina: Secondary | ICD-10-CM | POA: Diagnosis not present

## 2019-11-23 LAB — BASIC METABOLIC PANEL
Anion gap: 8 (ref 5–15)
BUN: 14 mg/dL (ref 8–23)
CO2: 25 mmol/L (ref 22–32)
Calcium: 9 mg/dL (ref 8.9–10.3)
Chloride: 105 mmol/L (ref 98–111)
Creatinine, Ser: 0.94 mg/dL (ref 0.44–1.00)
GFR, Estimated: >60 mL/min (ref >60)
Glucose, Bld: 100 mg/dL — ABNORMAL HIGH (ref 70–99)
Potassium: 3.8 mmol/L (ref 3.5–5.1)
Sodium: 138 mmol/L (ref 135–145)

## 2019-11-23 LAB — CBC
HCT: 40.2 % (ref 36.0–46.0)
Hemoglobin: 13.8 g/dL (ref 12.0–15.0)
MCH: 33.7 pg (ref 26.0–34.0)
MCHC: 34.3 g/dL (ref 30.0–36.0)
MCV: 98 fL (ref 80.0–100.0)
Platelets: 174 10*3/uL (ref 150–400)
RBC: 4.1 MIL/uL (ref 3.87–5.11)
RDW: 12.3 % (ref 11.5–15.5)
WBC: 11 10*3/uL — ABNORMAL HIGH (ref 4.0–10.5)
nRBC: 0 % (ref 0.0–0.2)

## 2019-11-23 MED ORDER — LISINOPRIL 5 MG PO TABS
5.0000 mg | ORAL_TABLET | Freq: Every day | ORAL | Status: DC
  Administered 2019-11-23: 5 mg via ORAL
  Filled 2019-11-23: qty 1

## 2019-11-23 MED ORDER — NITROGLYCERIN 0.4 MG SL SUBL: 0.4000 mg | SUBLINGUAL_TABLET | SUBLINGUAL | 2 refills | Status: DC | PRN

## 2019-11-23 MED ORDER — ASPIRIN 81 MG PO TBEC
81.0000 mg | DELAYED_RELEASE_TABLET | Freq: Every day | ORAL | 11 refills | Status: DC
Start: 2019-11-23 — End: 2022-07-18

## 2019-11-23 MED ORDER — ROSUVASTATIN CALCIUM 20 MG PO TABS: 20.0000 mg | ORAL_TABLET | Freq: Every day | ORAL | 2 refills | Status: DC

## 2019-11-23 MED ORDER — LISINOPRIL 5 MG PO TABS
5.0000 mg | ORAL_TABLET | Freq: Every day | ORAL | 2 refills | Status: DC
Start: 2019-11-23 — End: 2020-02-17

## 2019-11-23 NOTE — Progress Notes (Signed)
CARDIAC REHAB PHASE I   PRE:  Rate/Rhythm: 65 SR  BP:  Sitting: 107/56      SaO2: 92 RA  MODE:  Ambulation: 350 ft   POST:  Rate/Rhythm: 98 SR  BP:  Sitting: 123/52    SaO2: 99 RA  Pt ambulated 371ft in hallway standby assist with front wheel walker. Pt denies pain, dizziness, or SOB. Pt educated on importance of ASA, Plavix, statin, and NTG. Pt given stent card and heart healthy diet. Reviewed site care, restrictions, and exercise guidelines. Will refer to CRP II [facility] Rufina Falco, RN BSN 11/23/2019 9:03 AM

## 2019-11-23 NOTE — Progress Notes (Signed)
   The femoral cath site is palpated.  There is no obvious hematoma or significant tenderness.  Angiographic results following PCI were noted.  She has diffuse disease in the right coronary as well as the left coronary system.  We had a long discussion concerning the importance of aggressive risk factor modification which in her case importantly include smoking cessation and management of LDL cholesterol to less than 70.  She will try to be compliant with both these measures.  The ECG from this morning does not reveal any ischemic changes.  BUN and creatinine and hemoglobin are stable.  Discharge on moderate intensity rosuvastatin, dual antiplatelet therapy, encourage smoking cessation date, and arrange cardiology follow-up with primary cardiologist, Dr. Kirk Ruths.

## 2019-11-23 NOTE — Discharge Instructions (Signed)
Nonspecific Chest Pain Chest pain can be caused by many different conditions. Some causes of chest pain can be life-threatening. These will require treatment right away. Serious causes of chest pain include:  Heart attack.  A tear in the body's main blood vessel.  Redness and swelling (inflammation) around your heart.  Blood clot in your lungs. Other causes of chest pain may not be so serious. These include:  Heartburn.  Anxiety or stress.  Damage to bones or muscles in your chest.  Lung infections. Chest pain can feel like:  Pain or discomfort in your chest.  Crushing, pressure, aching, or squeezing pain.  Burning or tingling.  Dull or sharp pain that is worse when you move, cough, or take a deep breath.  Pain or discomfort that is also felt in your back, neck, jaw, shoulder, or arm, or pain that spreads to any of these areas. It is hard to know whether your pain is caused by something that is serious or something that is not so serious. So it is important to see your doctor right away if you have chest pain. Follow these instructions at home: Medicines  Take over-the-counter and prescription medicines only as told by your doctor.  If you were prescribed an antibiotic medicine, take it as told by your doctor. Do not stop taking the antibiotic even if you start to feel better. Lifestyle   Rest as told by your doctor.  Do not use any products that contain nicotine or tobacco, such as cigarettes, e-cigarettes, and chewing tobacco. If you need help quitting, ask your doctor.  Do not drink alcohol.  Make lifestyle changes as told by your doctor. These may include: ? Getting regular exercise. Ask your doctor what activities are safe for you. ? Eating a heart-healthy diet. A diet and nutrition specialist (dietitian) can help you to learn healthy eating options. ? Staying at a healthy weight. ? Treating diabetes or high blood pressure, if needed. ? Lowering your stress.  Activities such as yoga and relaxation techniques can help. General instructions  Pay attention to any changes in your symptoms. Tell your doctor about them or any new symptoms.  Avoid any activities that cause chest pain.  Keep all follow-up visits as told by your doctor. This is important. You may need more testing if your chest pain does not go away. Contact a doctor if:  Your chest pain does not go away.  You feel depressed.  You have a fever. Get help right away if:  Your chest pain is worse.  You have a cough that gets worse, or you cough up blood.  You have very bad (severe) pain in your belly (abdomen).  You pass out (faint).  You have either of these for no clear reason: ? Sudden chest discomfort. ? Sudden discomfort in your arms, back, neck, or jaw.  You have shortness of breath at any time.  You suddenly start to sweat, or your skin gets clammy.  You feel sick to your stomach (nauseous).  You throw up (vomit).  You suddenly feel lightheaded or dizzy.  You feel very weak or tired.  Your heart starts to beat fast, or it feels like it is skipping beats. These symptoms may be an emergency. Do not wait to see if the symptoms will go away. Get medical help right away. Call your local emergency services (911 in the [place] Do not drive yourself to the hospital. Summary  Chest pain can be caused by many different conditions. The   cause may be serious and need treatment right away. If you have chest pain, see your doctor right away.  Follow your doctor's instructions for taking medicines and making lifestyle changes.  Keep all follow-up visits as told by your doctor. This includes visits for any further testing if your chest pain does not go away.  Be sure to know the signs that show that your condition has become worse. Get help right away if you have these symptoms. This information is not intended to replace advice given to you by your health care provider. Make  sure you discuss any questions you have with your health care provider. Document Revised: 06/26/2017 Document Reviewed: 06/26/2017 Elsevier Patient Education  [place].   Chest Wall Pain Chest wall pain is pain in or around the bones and muscles of your chest. Chest wall pain may be caused by:  An injury.  Coughing a lot.  Using your chest and arm muscles too much. Sometimes, the cause may not be known. This pain may take a few weeks or longer to get better. Follow these instructions at home: Managing pain, stiffness, and swelling If told, put ice on the painful area:  Put ice in a plastic bag.  Place a towel between your skin and the bag.  Leave the ice on for 20 minutes, 2-3 times a day.  Activity  Rest as told by your doctor.  Avoid doing things that cause pain. This includes lifting heavy items.  Ask your doctor what activities are safe for you. General instructions   Take over-the-counter and prescription medicines only as told by your doctor.  Do not use any products that contain nicotine or tobacco, such as cigarettes, e-cigarettes, and chewing tobacco. If you need help quitting, ask your doctor.  Keep all follow-up visits as told by your doctor. This is important. Contact a doctor if:  You have a fever.  Your chest pain gets worse.  You have new symptoms. Get help right away if:  You feel sick to your stomach (nauseous) or you throw up (vomit).  You feel sweaty or light-headed.  You have a cough with mucus from your lungs (sputum) or you cough up blood.  You are short of breath. These symptoms may be an emergency. Do not wait to see if the symptoms will go away. Get medical help right away. Call your local emergency services (911 in the [place] Do not drive yourself to the hospital. Summary  Chest wall pain is pain in or around the bones and muscles of your chest.  It may be treated with ice, rest, and medicines. Your condition may also get  better if you avoid doing things that cause pain.  Contact a doctor if you have a fever, chest pain that gets worse, or new symptoms.  Get help right away if you feel light-headed or you get short of breath. These symptoms may be an emergency. This information is not intended to replace advice given to you by your health care provider. Make sure you discuss any questions you have with your health care provider. Document Revised: 06/26/2017 Document Reviewed: 06/26/2017 Elsevier Patient Education  2020 [employer] is sudden, severe pain in the chest and abdomen that is caused by complications from a viral infection. Pleurodynia may also be called epidemic pleurodynia or [place] disease. In most cases, pleurodynia goes away on its own in 2-5 days. What are the causes? This condition is usually caused by a virus called  coxsackievirus B. In rare cases, other viruses may cause pleurodynia. These include coxsackievirus A or echoviruses. Coxsackievirus B spreads easily from person to person (is contagious). It can be spread through:  Coughing.  Sneezing.  Close physical contact with an infected person.  Contact with the stool of an infected person. What increases the risk? This condition is more likely to develop in:  Children.  People that live in places where there are poor public health conditions (sanitation).  People that live in places where there is overcrowding. What are the signs or symptoms? The main symptom of this condition is severe chest pain that makes breathing painful. The pain is usually on one side of the body, along the lower ribs. The pain starts suddenly and may last from a few seconds to 1 minute. You might feel pain again a few minutes or hours later. These brief periods of pain usually come and go for 2-5 days. In some cases, pain may continue to come and go for as long as a month. Other symptoms of pleurodynia may  include:  Abdominal pain.  Fever.  Headache.  Sore throat.  Feeling tired (fatigue).  Pain when pressing on the chest or belly.  Dry cough.  Nausea or vomiting.  Diarrhea.  Rash.  Testicle pain in men. How is this diagnosed? This condition is diagnosed based on:  Your medical history.  A physical exam.  Tests, such as: ? A throat swab. ? Stool tests. You may need to give a stool sample to your health care provider. ? Blood tests. ? Chest X-rays. How is this treated? There is no specific treatment for this condition. However, symptoms go away within days or weeks. In the meantime, your health care provider may recommend medicine to help relieve pain and fever. Follow these instructions at home: Medicines  Take over-the-counter and prescription medicines only as told by your health care provider.  If your child has pleurodynia, do not give your child aspirin because of the association with Reye's syndrome. General instructions  Rest at home as told by your health care provider.  Return to your normal activities as told by your health care provider. Ask your health care provider what activities are safe for you.  Do not use any products that contain nicotine or tobacco, such as cigarettes, e-cigarettes, and chewing tobacco. If you need help quitting, ask your health care provider.  Drink enough fluid to keep your urine pale yellow.  Keep all follow-up visits as told by your health care provider. This is important. How is this prevented?   Wash your hands often to prevent the virus from spreading. If soap and water are not available, use alcohol-based hand sanitizer.  Make sure that other people in your household also wash their hands often.  Use a bleach-based household cleaner to clean and disinfect commonly used items and surfaces often.  Do not share personal items such as toothbrushes and eating utensils. Contact a health care provider if:  Your  symptoms last longer than 5 days.  You have pain that does not get better with medicine. Get help right away if:  You have severe chest pain that is getting worse.  You have difficulty breathing.  You have a sudden, severe headache and a stiff neck. Summary  Pleurodynia is sudden, severe pain in the chest and abdomen that is caused by complications from a viral infection. It is usually caused by a virus called coxsackievirus B.  Coxsackievirus B spreads easily from person  to person (is contagious).  There is no specific treatment for this condition. However, symptoms go away within days or weeks.  Follow instructions from your health care provider about rest, medicines, activity, and drinking enough fluids. Keep all follow-up visits. This information is not intended to replace advice given to you by your health care provider. Make sure you discuss any questions you have with your health care provider. Document Revised: 04/16/2018 Document Reviewed: 10/23/2017 Elsevier Patient Education  [place].

## 2019-11-23 NOTE — Discharge Summary (Addendum)
The patient has been seen in conjunction with Reino Bellis, NP. All aspects of care have been considered and discussed. The patient has been personally interviewed, examined, and all clinical data has been reviewed.  S/P PCI ostial and mid RCA severe disease by Dr. Saunders Revel. No post procedure complications.  Right femoral without hematoma. Mild ecchymosis noted. The patient is ready for discharge.  Discharge Summary    Patient ID: Kimberly [name] MRN: [id number]; DOB: Apr 14, 1950  Admit date: 11/19/2019 Discharge date: 11/23/2019  Primary Care Provider: Sueanne Margarita, DO  Primary Cardiologist: Kirk Ruths, MD  Primary Electrophysiologist:  None   Discharge Diagnoses    Principal Problem:   Unstable angina [facility]) Active Problems:   TOBACCO ABUSE   HYPERCHOLESTEROLEMIA   Hypertension    Diagnostic Studies/Procedures    Cath: 11/19/19  Ost RCA to Prox RCA lesion is 99% stenosed. Prox RCA to Mid RCA lesion is 70% stenosed. The left ventricular systolic function is normal. LV end diastolic pressure is normal. The left ventricular ejection fraction is 55-65% by visual estimate. Prox Cx lesion is 50% stenosed. Dist Cx lesion is 30% stenosed. Mid LAD lesion is 60% stenosed. 2nd Diag lesion is 50% stenosed.   1.  Severe one-vessel coronary artery disease with 99% stenosis in ostial right coronary artery and likely 70% mid stenosis.  Moderate disease affecting the LAD and left circumflex. 2.  Normal LV systolic function and left ventricular end-diastolic pressure. 3.  Inability to cannulate the right coronary artery in spite of using multiple catheters due to severe ostial stenosis and a calcified ridge above the origin of the ostium.  Attempted wiring of the vessel from distance was also not successful.  Given that the patient was fully anticoagulated and I exceeded my contrast limits, I elected to abort the procedure with plans for attempted PCI via the femoral approach.     Recommendations: Given chest pain at rest, will admit the patient to the hospital and hydrate post contrast exposure.  Plan for attempted RCA PCI via the femoral approach on Monday.  Diagnostic Dominance: Right   Cath: 11/22/19  Conclusions: Severe ostial and mid RCA disease, as noted on Dr. Tyrell Antonio diagnostic angiogram last week. Successful PCI to ostial RCA using Resolute Onyx 2.75 x 12 mm drug-eluting stent (postdilated to 3.1 mm) with 0% residual stenosis and TIMI-3 flow.  Due to unusual takeoff of the vessel, a small amount of stent protrudes into the aorta. Successful PCI to the mid RCA using Resolute Onyx 2.5 x 18 mm drug-eluting stent (postdilated to 2.9 mm) with 0% residual stenosis and TIMI-3 flow. Low left ventricular filling pressure (LVEDP~ 5 mmHg).   Recommendations: Continue indefinite dual antiplatelet therapy with aspirin and clopidogrel, given extensive coronary and peripheral vascular disease. Aggressive secondary prevention. Remove right femoral artery sheath 2 hours after discontinuation of bivalirudin.   Nelva Bush, MD [facility]  Diagnostic Dominance: Right  Intervention    Echo: 11/20/19  IMPRESSIONS     1. Left ventricular ejection fraction, by estimation, is 60 to 65%. The  left ventricle has normal function. The left ventricle has no regional  wall motion abnormalities. Left ventricular diastolic parameters are  consistent with Grade I diastolic  dysfunction (impaired relaxation).   2. Right ventricular systolic function is normal. The right ventricular  size is normal. Tricuspid regurgitation signal is inadequate for assessing  PA pressure.   3. The mitral valve is normal in structure. Trivial mitral valve  regurgitation. No evidence of mitral  stenosis.   4. The aortic valve is tricuspid. Aortic valve regurgitation is not  visualized. Mild aortic valve sclerosis is present, with no evidence of  aortic valve stenosis.   5. The  inferior vena cava is normal in size with greater than 50%  respiratory variability, suggesting right atrial pressure of 3 mmHg.  _____________   History of Present Illness     Kimberly [name] is a 69 y.o. female with PMH of HLD, HTN, PVD with left subclavian artery stent and left brachial artery angioplasty, right brachial artery embolectomy, previous left carotid endarterectomy and aortobifemoral bypass graft.  Most recent carotid Dopplers August 2021 showed 40 to 59% bilateral stenosis.  Dopplers November 2021 showed patent aortobifemoral bypass graft who was admitted with chest pain.    Hospital Course     1. Chest pain: underwent cardiac cath 11/19/2019 99% ostial RCA, 70% mRCA and moderate LAD and LCx disease. Failed PCI of RCA due to inability to cannulate. Remained inpatient until Monday with repeat cath via femoral approach with successful PCI to the osital RCA with DES x1, and DESx1 to mRCA. Plan for indefinite DAPT with ASA/plavix given extensive coronary and peripheral vascular disease.        2. PAD: on plavix daily and 162mg  aspirin every other day prior to admission. Now on DAPT with plavix and 81mg  ASA daily.   3. HTN: BB was deferred 2/2 baseline bradycardia. She was agreeable to low dose lisinopril 5mg  daily at discharge.    4. HLD: intolerant to statins, on 20mg  every other day of Lipitor but reported she was not taking. Was agreeable to start Crestor 20 mg daily at discharge. If unable to tolerate will need to consider PCSK9s as an outpatient.    5. Tobacco abuse: she is motivated to stop.  General: Well developed, well nourished, female appearing in no acute distress. Head: Normocephalic, atraumatic.  Neck: Supple without bruits, JVD. Lungs:  Resp regular and unlabored, CTA. Heart: RRR, S1, S2, no S3, S4, or murmur; no rub. Abdomen: Soft, non-tender, non-distended with normoactive bowel sounds. No hepatomegaly. No rebound/guarding. No obvious abdominal  masses. Extremities: No clubbing, cyanosis, edema. Distal pedal pulses are 2+ bilaterally. Right radial/femoral cath site stable without bruising or hematoma Neuro: Alert and oriented X 3. Moves all extremities spontaneously. Psych: Normal affect.   Did the patient have an acute coronary syndrome (MI, NSTEMI, STEMI, etc) this admission?:  Yes                               AHA/ACC Clinical Performance & Quality Measures: Aspirin prescribed? - Yes ADP Receptor Inhibitor (Plavix/Clopidogrel, Brilinta/Ticagrelor or Effient/Prasugrel) prescribed (includes medically managed patients)? - Yes Beta Blocker prescribed? - No - bradycardia High Intensity Statin (Lipitor 40-80mg  or Crestor 20-40mg ) prescribed? - Yes EF assessed during THIS hospitalization? - Yes For EF <40%, was ACEI/ARB prescribed? - Yes For EF <40%, Aldosterone Antagonist (Spironolactone or Eplerenone) prescribed? - Not Applicable (EF >/= 08%) Cardiac Rehab Phase II ordered (including medically managed patients)? - Yes       _____________  Discharge Vitals Blood pressure 107/73, pulse 74, temperature 98 F (36.7 C), temperature source Oral, resp. rate 18, height 5\' 4"  (1.626 m), weight 51.5 kg, SpO2 96 %.  Filed Weights   11/21/19 0422 11/22/19 0352 11/23/19 0506  Weight: 49.9 kg 48.9 kg 51.5 kg    Labs & Radiologic Studies    CBC Recent Labs  11/22/19 1402 11/23/19 0315  WBC 8.4 11.0*  HGB 14.3 13.8  HCT 41.6 40.2  MCV 98.3 98.0  PLT 185 062   Basic Metabolic Panel Recent Labs    11/22/19 0433 11/22/19 0433 11/22/19 1402 11/23/19 0315  NA 139  --   --  138  K 3.9  --   --  3.8  CL 104  --   --  105  CO2 28  --   --  25  GLUCOSE 88  --   --  100*  BUN 9  --   --  14  CREATININE 0.98   < > 0.87 0.94  CALCIUM 9.2  --   --  9.0   < > = values in this interval not displayed.   Liver Function Tests No results for input(s): AST, ALT, ALKPHOS, BILITOT, PROT, ALBUMIN in the last 72 hours. No results for  input(s): LIPASE, AMYLASE in the last 72 hours. High Sensitivity Troponin:   Recent Labs  Lab 10/27/19 2231 10/28/19 0040  TROPONINIHS 4 6    BNP Invalid input(s): POCBNP D-Dimer No results for input(s): DDIMER in the last 72 hours. Hemoglobin A1C No results for input(s): HGBA1C in the last 72 hours. Fasting Lipid Panel Recent Labs    11/21/19 0312  CHOL 210*  HDL 38*  LDLCALC 152*  TRIG 100  CHOLHDL 5.5   Thyroid Function Tests No results for input(s): TSH, T4TOTAL, T3FREE, THYROIDAB in the last 72 hours.  Invalid input(s): FREET3 _____________  DG Chest 2 View  Result Date: 10/27/2019 CLINICAL DATA:  Chest pain radiating to the left arm. Shortness of breath. Current smoker. EXAM: CHEST - 2 VIEW COMPARISON:  02/26/2018 FINDINGS: Emphysematous changes in the lungs. Scattered fibrosis and peribronchial thickening consistent with chronic bronchitis. No airspace disease or consolidation in the lungs. No pleural effusions. No pneumothorax. Normal heart size. Mediastinal contours appear intact. Calcification of the aorta. Vascular stent in the mediastinum. Surgical clips in the base of the neck. IMPRESSION: Emphysematous and chronic bronchitic changes in the lungs. No evidence of active pulmonary disease. Electronically Signed   By: Lucienne Capers M.D.   On: 10/27/2019 22:27   CARDIAC CATHETERIZATION  Result Date: 11/22/2019 Conclusions: 1. Severe ostial and mid RCA disease, as noted on Dr. Tyrell Antonio diagnostic angiogram last week. 2. Successful PCI to ostial RCA using Resolute Onyx 2.75 x 12 mm drug-eluting stent (postdilated to 3.1 mm) with 0% residual stenosis and TIMI-3 flow.  Due to unusual takeoff of the vessel, a small amount of stent protrudes into the aorta. 3. Successful PCI to the mid RCA using Resolute Onyx 2.5 x 18 mm drug-eluting stent (postdilated to 2.9 mm) with 0% residual stenosis and TIMI-3 flow. 4. Low left ventricular filling pressure (LVEDP~ 5 mmHg).  Recommendations: 1. Continue indefinite dual antiplatelet therapy with aspirin and clopidogrel, given extensive coronary and peripheral vascular disease. 2. Aggressive secondary prevention. 3. Remove right femoral artery sheath 2 hours after discontinuation of bivalirudin. Nelva Bush, MD [facility]  CARDIAC CATHETERIZATION  Result Date: 11/19/2019  Ost RCA to Prox RCA lesion is 99% stenosed.  Prox RCA to Mid RCA lesion is 70% stenosed.  The left ventricular systolic function is normal.  LV end diastolic pressure is normal.  The left ventricular ejection fraction is 55-65% by visual estimate.  Prox Cx lesion is 50% stenosed.  Dist Cx lesion is 30% stenosed.  Mid LAD lesion is 60% stenosed.  2nd Diag lesion is 50% stenosed.  1.  Severe one-vessel coronary artery disease with 99% stenosis in ostial right coronary artery and likely 70% mid stenosis.  Moderate disease affecting the LAD and left circumflex. 2.  Normal LV systolic function and left ventricular end-diastolic pressure. 3.  Inability to cannulate the right coronary artery in spite of using multiple catheters due to severe ostial stenosis and a calcified ridge above the origin of the ostium.  Attempted wiring of the vessel from distance was also not successful.  Given that the patient was fully anticoagulated and I exceeded my contrast limits, I elected to abort the procedure with plans for attempted PCI via the femoral approach. Recommendations: Given chest pain at rest, will admit the patient to the hospital and hydrate post contrast exposure.  Plan for attempted RCA PCI via the femoral approach on Monday.   VAS [place] ABI WITH/WO TBI  Result Date: 11/15/2019 LOWER EXTREMITY DOPPLER STUDY Indications: Peripheral artery disease. High Risk Factors: Hypertension, hyperlipidemia.  Comparison Study: 09/11/2018: Rt- ABI 1.02, TBI 0.66; Lt- ABI 0.73, TBI 0.53. Performing Technologist: Ivan Croft  Examination Guidelines: A complete  evaluation includes at minimum, Doppler waveform signals and systolic blood pressure reading at the level of bilateral brachial, anterior tibial, and posterior tibial arteries, when vessel segments are accessible. Bilateral testing is considered an integral part of a complete examination. Photoelectric Plethysmograph (PPG) waveforms and toe systolic pressure readings are included as required and additional duplex testing as needed. Limited examinations for reoccurring indications may be performed as noted.  ABI Findings: +---------+------------------+-----+---------+--------+ Right    Rt Pressure (mmHg)IndexWaveform Comment  +---------+------------------+-----+---------+--------+ Brachial 167                                      +---------+------------------+-----+---------+--------+ PTA      134               0.80 biphasic          +---------+------------------+-----+---------+--------+ DP       152               0.91 triphasic         +---------+------------------+-----+---------+--------+ Great Toe105               0.63                   +---------+------------------+-----+---------+--------+ +---------+------------------+-----+--------+-------+ Left     Lt Pressure (mmHg)IndexWaveformComment +---------+------------------+-----+--------+-------+ Brachial 152                                    +---------+------------------+-----+--------+-------+ PTA      102               0.61 biphasic        +---------+------------------+-----+--------+-------+ DP       99                0.59 biphasic        +---------+------------------+-----+--------+-------+ Great Toe89                0.53                 +---------+------------------+-----+--------+-------+ +-------+-----------+-----------+------------+------------+ ABI/TBIToday's ABIToday's TBIPrevious ABIPrevious TBI +-------+-----------+-----------+------------+------------+ Right  0.91       0.63        1.02        0.66         +-------+-----------+-----------+------------+------------+  Left   0.61       0.53       0.73        0.53         +-------+-----------+-----------+------------+------------+   Summary: Right: Resting right ankle-brachial index indicates mild right lower extremity arterial disease. The right toe-brachial index is abnormal. Left: Resting left ankle-brachial index indicates moderate left lower extremity arterial disease. The left toe-brachial index is abnormal.  *See table(s) above for measurements and observations.  Electronically signed by Harold Barban MD on 11/15/2019 at 12:18:44 PM.    Final    ECHOCARDIOGRAM COMPLETE  Result Date: 11/20/2019    ECHOCARDIOGRAM REPORT   Patient Name:   Kimberly [name] Date of Exam: 11/20/2019 Medical Rec #:  [id number]       Height:       64.0 in Accession #:    [id number]      Weight:       111.1 lb Date of Birth:  [birth year]       BSA:          1.524 m Patient Age:    50 years        BP:           127/66 mmHg Patient Gender: F               HR:           54 bpm. Exam Location:  Inpatient Procedure: 2D Echo, Cardiac Doppler and Color Doppler Indications:    R07.9* Chest pain, unspecified  History:        Patient has prior history of Echocardiogram examinations, most                 recent 02/06/2011. Stroke, Signs/Symptoms:Chest Pain; Risk                 Factors:Hypertension and Dyslipidemia.  Sonographer:    Bernadene Person RDCS Referring Phys: [place]  1. Left ventricular ejection fraction, by estimation, is 60 to 65%. The left ventricle has normal function. The left ventricle has no regional wall motion abnormalities. Left ventricular diastolic parameters are consistent with Grade I diastolic dysfunction (impaired relaxation).  2. Right ventricular systolic function is normal. The right ventricular size is normal. Tricuspid regurgitation signal is inadequate for assessing PA pressure.  3. The mitral valve is normal  in structure. Trivial mitral valve regurgitation. No evidence of mitral stenosis.  4. The aortic valve is tricuspid. Aortic valve regurgitation is not visualized. Mild aortic valve sclerosis is present, with no evidence of aortic valve stenosis.  5. The inferior vena cava is normal in size with greater than 50% respiratory variability, suggesting right atrial pressure of 3 mmHg. FINDINGS  Left Ventricle: Left ventricular ejection fraction, by estimation, is 60 to 65%. The left ventricle has normal function. The left ventricle has no regional wall motion abnormalities. The left ventricular internal cavity size was normal in size. There is  no left ventricular hypertrophy. Left ventricular diastolic parameters are consistent with Grade I diastolic dysfunction (impaired relaxation). Right Ventricle: The right ventricular size is normal. Right ventricular systolic function is normal. Tricuspid regurgitation signal is inadequate for assessing PA pressure. The tricuspid regurgitant velocity is 2.54 m/s, and with an assumed right atrial  pressure of 3 mmHg, the estimated right ventricular systolic pressure is 62.2 mmHg. Left Atrium: Left atrial size was normal in size. Right Atrium: Right atrial size was normal in size. Pericardium: There is no  evidence of pericardial effusion. Mitral Valve: The mitral valve is normal in structure. Trivial mitral valve regurgitation. No evidence of mitral valve stenosis. Tricuspid Valve: The tricuspid valve is normal in structure. Tricuspid valve regurgitation is mild . No evidence of tricuspid stenosis. Aortic Valve: The aortic valve is tricuspid. Aortic valve regurgitation is not visualized. Mild aortic valve sclerosis is present, with no evidence of aortic valve stenosis. Pulmonic Valve: The pulmonic valve was normal in structure. Pulmonic valve regurgitation is not visualized. No evidence of pulmonic stenosis. Aorta: The aortic root is normal in size and structure. Venous: The inferior  vena cava is normal in size with greater than 50% respiratory variability, suggesting right atrial pressure of 3 mmHg.  LEFT VENTRICLE PLAX 2D LVIDd:         4.30 cm  Diastology LVIDs:         2.90 cm  LV e' medial:    8.27 cm/s LV PW:         0.80 cm  LV E/e' medial:  8.9 LV IVS:        0.80 cm  LV e' lateral:   7.51 cm/s LVOT diam:     1.70 cm  LV E/e' lateral: 9.8 LV SV:         58 LV SV Index:   38 LVOT Area:     2.27 cm  RIGHT VENTRICLE RV S prime:     12.00 cm/s TAPSE (M-mode): 2.0 cm LEFT ATRIUM             Index       RIGHT ATRIUM           Index LA diam:        2.80 cm 1.84 cm/m  RA Area:     11.10 cm LA Vol (A2C):   35.9 ml 23.56 ml/m RA Volume:   21.80 ml  14.31 ml/m LA Vol (A4C):   27.2 ml 17.85 ml/m LA Biplane Vol: 32.5 ml 21.33 ml/m  AORTIC VALVE LVOT Vmax:   126.00 cm/s LVOT Vmean:  78.000 cm/s LVOT VTI:    0.257 m  AORTA Ao Root diam: 2.70 cm Ao Asc diam:  2.70 cm MITRAL VALVE               TRICUSPID VALVE MV Area (PHT): 2.42 cm    TR Peak grad:   25.8 mmHg MV Decel Time: 313 msec    TR Vmax:        254.00 cm/s MV E velocity: 73.70 cm/s MV A velocity: 75.80 cm/s  SHUNTS MV E/A ratio:  0.97        Systemic VTI:  0.26 m                            Systemic Diam: 1.70 cm Kirk Ruths MD Electronically signed by Kirk Ruths MD Signature Date/Time: 11/20/2019/2:39:15 PM    Final    VAS US AORTA/IVC/ILIACS  Result Date: 11/15/2019 ABDOMINAL AORTA STUDY Risk Factors: Hypertension, hyperlipidemia. Vascular Interventions: 02/26/2011: Aortobifemoral bypass graft. Limitations: Air/bowel gas and patient discomfort.  Comparison Study: None Performing Technologist: Ivan Croft  Examination Guidelines: A complete evaluation includes B-mode imaging, spectral Doppler, color Doppler, and power Doppler as needed of all accessible portions of each vessel. Bilateral testing is considered an integral part of a complete examination. Limited examinations for reoccurring indications may be performed as  noted.  Graft #1: +-----------------------------+--------+--------+--------+--------+ Aortobifemoral (to right CFA)PSV cm/sStenosisWaveformComments +-----------------------------+--------+--------+--------+--------+ Inflow  106             biphasic         +-----------------------------+--------+--------+--------+--------+ Prox Anastomosis             98              biphasic         +-----------------------------+--------+--------+--------+--------+ Proximal Graft               93              biphasic         +-----------------------------+--------+--------+--------+--------+ Mid Graft                    112             biphasic         +-----------------------------+--------+--------+--------+--------+ Distal Graft                 87              biphasic         +-----------------------------+--------+--------+--------+--------+ Distal Anastomosis           205             biphasic         +-----------------------------+--------+--------+--------+--------+ Outflow                      216             biphasic         +-----------------------------+--------+--------+--------+--------+ Patent Graft #1: +----------------------------+--------+--------+--------+--------+ Aortobifemoral (to left CFA)PSV cm/sStenosisWaveformComments +----------------------------+--------+--------+--------+--------+ Inflow                                                       +----------------------------+--------+--------+--------+--------+ Proximal Anastomosis                                         +----------------------------+--------+--------+--------+--------+ Proximal Graft                                               +----------------------------+--------+--------+--------+--------+ Mid Graft                                                    +----------------------------+--------+--------+--------+--------+ Distal Graft                 104             biphasic         +----------------------------+--------+--------+--------+--------+ Distal Anastamosis          189             biphasic         +----------------------------+--------+--------+--------+--------+ Outflow                     147             biphasic         +----------------------------+--------+--------+--------+--------+ Patent  Summary: Abdominal Aorta: The aortobifemoral bypass graft is patent without evidence of significant stenosis.  *See table(s) above for measurements and observations.  Electronically signed by Harold Barban MD on 11/15/2019 at 12:18:58 PM.    Final    Disposition   Pt is being discharged home today in good condition.  Follow-up Plans & Appointments     Follow-up Information     Deberah Pelton, NP Follow up on 12/14/2019.   Specialty: Cardiology Why: at 2:15pm for your follow up appt.  Contact information: [address] [place] [address] [phone]                Discharge Instructions     Amb Referral to Cardiac Rehabilitation   Complete by: As directed    Diagnosis: Coronary Stents   After initial evaluation and assessments completed: Virtual Based Care may be provided alone or in conjunction with Phase 2 Cardiac Rehab based on patient barriers.: Yes   Call MD for:  redness, tenderness, or signs of infection (pain, swelling, redness, odor or green/yellow discharge around incision site)   Complete by: As directed    Diet - low sodium heart healthy   Complete by: As directed    Discharge instructions   Complete by: As directed    Groin Site Care Refer to this sheet in the next few weeks. These instructions provide you with information on caring for yourself after your procedure. Your caregiver may also give you more specific instructions. Your treatment has been planned according to current medical practices, but problems sometimes occur. Call your caregiver if you have any  problems or questions after your procedure. HOME CARE INSTRUCTIONS You may shower 24 hours after the procedure. Remove the bandage (dressing) and gently wash the site with plain soap and water. Gently pat the site dry.  Do not apply powder or lotion to the site.  Do not sit in a bathtub, swimming pool, or whirlpool for 5 to 7 days.  No bending, squatting, or lifting anything over 10 pounds (4.5 kg) as directed by your caregiver.  Inspect the site at least twice daily.  Do not drive home if you are discharged the same day of the procedure. Have someone else drive you.  You may drive 24 hours after the procedure unless otherwise instructed by your caregiver.  What to expect: Any bruising will usually fade within 1 to 2 weeks.  Blood that collects in the tissue (hematoma) may be painful to the touch. It should usually decrease in size and tenderness within 1 to 2 weeks.  SEEK IMMEDIATE MEDICAL CARE IF: You have unusual pain at the groin site or down the affected leg.  You have redness, warmth, swelling, or pain at the groin site.  You have drainage (other than a small amount of blood on the dressing).  You have chills.  You have a fever or persistent symptoms for more than 72 hours.  You have a fever and your symptoms suddenly get worse.  Your leg becomes pale, cool, tingly, or numb.  You have heavy bleeding from the site. Hold pressure on the site. Marland Kitchen  PLEASE DO NOT MISS ANY DOSES OF YOUR PLAVIX!!!!! Also keep a log of you blood pressures and bring back to your follow up appt. Please call the office with any questions.   Patients taking blood thinners should generally stay away from medicines like ibuprofen, Advil, Motrin, naproxen, and Aleve due to risk of stomach bleeding. You may take Tylenol  as directed or talk to your primary doctor about alternatives.   PLEASE ENSURE THAT YOU DO NOT RUN OUT OF YOUR BRILINTA/PLAVIX. This medication is very important to remain on for at least one year.  IF you have issues obtaining this medication due to cost please CALL the office 3-5 business days prior to running out in order to prevent missing doses of this medication.   Increase activity slowly   Complete by: As directed        Discharge Medications   Allergies as of 11/23/2019       Reactions   Codeine Nausea And Vomiting   Dizziness, headache, has passed out   Hydrocodone Nausea And Vomiting, Other (See Comments)   Headache and dizziness   Other Shortness Of Breath   Allergy to cologne/perfume base   Oxycodone Nausea And Vomiting, Other (See Comments)   Dizziness    Atorvastatin Diarrhea, Nausea And Vomiting   Causes GI symptoms if she takes it every day.        Medication List     STOP taking these medications    amoxicillin-clavulanate 875-125 MG tablet Commonly known as: AUGMENTIN   aspirin 325 MG tablet Replaced by: aspirin 81 MG EC tablet   atorvastatin 20 MG tablet Commonly known as: LIPITOR   ibuprofen 200 MG tablet Commonly known as: ADVIL       TAKE these medications    acetaminophen 650 MG CR tablet Commonly known as: TYLENOL Take 1,300 mg by mouth every 8 (eight) hours as needed for pain.   albuterol 108 (90 Base) MCG/ACT inhaler Commonly known as: VENTOLIN HFA Inhale 1-2 puffs into the lungs every 6 (six) hours as needed for wheezing or shortness of breath.   ASPERCREME LIDOCAINE EX Apply 1 application topically daily as needed (back pain).   aspirin 81 MG EC tablet Take 1 tablet (81 mg total) by mouth daily. Swallow whole. Replaces: aspirin 325 MG tablet   clopidogrel 75 MG tablet Commonly known as: PLAVIX TAKE ONE TABLET BY MOUTH AT BEDTIME   diphenhydrAMINE-zinc acetate cream Commonly known as: BENADRYL Apply 1 application topically daily as needed for itching.   ferrous sulfate 325 (65 FE) MG tablet Take 325 mg by mouth every Monday, Wednesday, and Friday.   lisinopril 5 MG tablet Commonly known as: ZESTRIL Take 1  tablet (5 mg total) by mouth daily.   loratadine 10 MG tablet Commonly known as: CLARITIN Take 10 mg by mouth daily as needed for allergies.   nitroGLYCERIN 0.4 MG SL tablet Commonly known as: Nitrostat Place 1 tablet (0.4 mg total) under the tongue every 5 (five) minutes as needed.   OVER THE COUNTER MEDICATION Take 1 tablet by mouth every other day. MK-7  (vit K + magnesium)   rosuvastatin 20 MG tablet Commonly known as: Crestor Take 1 tablet (20 mg total) by mouth daily.   VICKS VAPOINHALER IN Inhale 1 puff into the lungs daily as needed (congestion).   Vitamin B-12 5000 MCG Tbdp Take 5,000 mcg by mouth daily.   vitamin C 1000 MG tablet Take 1,000 mg by mouth daily.   Vitamin D3 50 MCG (2000 UT) Tabs Take 2,000 Units by mouth daily.        Outstanding Labs/Studies   FLP/LFTs in 8 weeks, BMET at follow up appt.   Duration of Discharge Encounter   Greater than 30 minutes including physician time.  Signed, Reino Bellis, NP 11/23/2019, 10:55 AM

## 2019-11-23 NOTE — Care Management Important Message (Signed)
Important Message  Patient Details  Name: JANICE BODINE MRN: [id number] Date of Birth: [birth year]   Medicare Important Message Given:  Yes     Shelda Altes 11/23/2019, 9:13 AM

## 2019-11-25 ENCOUNTER — Telehealth (HOSPITAL_COMMUNITY): Payer: Self-pay

## 2019-11-25 NOTE — Telephone Encounter (Signed)
Called patient to see if she is interested in the Cardiac Rehab Program. Patient expressed interest. Explained scheduling process and went over insurance, patient verbalized understanding. Will contact patient for scheduling once f/u has been completed. 

## 2019-11-25 NOTE — Telephone Encounter (Signed)
Pt insurance is active and benefits verified through [facility]. Co-pay $10.00, DED $0.00/$0.00 met, out of pocket $0.00/$0.00 met, co-insurance 0%. No pre-authorization required. Passport, 11/25/19 @ 2:44PM, BPQ#[id number]  Will contact patient to see if she is interested in the Cardiac Rehab Program. If interested, patient will need to complete follow up appt. Once completed, patient will be contacted for scheduling upon review by the RN Navigator.

## 2019-12-14 ENCOUNTER — Ambulatory Visit: Payer: Medicare HMO | Admitting: General Practice

## 2020-01-04 ENCOUNTER — Encounter: Payer: Self-pay | Admitting: Physician Assistant

## 2020-01-04 ENCOUNTER — Ambulatory Visit: Payer: Medicare HMO | Admitting: Physician Assistant

## 2020-01-04 ENCOUNTER — Other Ambulatory Visit: Payer: Self-pay

## 2020-01-04 VITALS — BP 132/86 | HR 61 | Ht 64.0 in | Wt 118.0 lb

## 2020-01-04 DIAGNOSIS — I10 Essential (primary) hypertension: Secondary | ICD-10-CM | POA: Diagnosis not present

## 2020-01-04 DIAGNOSIS — Z8673 Personal history of transient ischemic attack (TIA), and cerebral infarction without residual deficits: Secondary | ICD-10-CM | POA: Diagnosis not present

## 2020-01-04 DIAGNOSIS — I2510 Atherosclerotic heart disease of native coronary artery without angina pectoris: Secondary | ICD-10-CM | POA: Diagnosis not present

## 2020-01-04 DIAGNOSIS — I739 Peripheral vascular disease, unspecified: Secondary | ICD-10-CM | POA: Diagnosis not present

## 2020-01-04 DIAGNOSIS — E785 Hyperlipidemia, unspecified: Secondary | ICD-10-CM

## 2020-01-04 NOTE — Patient Instructions (Signed)
Medication Instructions:  Your physician recommends that you continue on your current medications as directed. Please refer to the Current Medication list given to you today.  *If you need a refill on your cardiac medications before your next appointment, please call your pharmacy*  Lab Work: Your physician recommends that you return for lab work in 1-2 MONTHS:  Fasting Lipid Panel-DO NOT EAT OR DRINK PAST MIDNIGHT. OKSY TO HAVE WATER.  Hepatic (Liver) Function Test.  If you have labs (blood work) drawn today and your tests are completely normal, you will receive your results only by: Marland Kitchen MyChart Message (if you have MyChart) OR . A paper copy in the mail If you have any lab test that is abnormal or we need to change your treatment, we will call you to review the results.  Testing/Procedures: NONE ordered at this time of appointment   Follow-Up: At [facility], you and your health needs are our priority.  As part of our continuing mission to provide you with exceptional heart care, we have created designated Provider Care Teams.  These Care Teams include your primary Cardiologist (physician) and Advanced Practice Providers (APPs -  Physician Assistants and Nurse Practitioners) who all work together to provide you with the care you need, when you need it.  Your next appointment:   Follow up as scheduled    The format for your next appointment:   In Person  Provider:   Olga Millers, MD  Other Instructions

## 2020-01-04 NOTE — Progress Notes (Signed)
Cardiology Office Note:    Date:  01/05/2020   ID:  Kimberly [name], DOB 1950-12-02, MRN [id number]  PCP:  Sueanne Margarita, DO  [place] Cardiologist:  Kirk Ruths, MD  [place] Electrophysiologist:  None   Referring MD: Sueanne Margarita, DO   Chief Complaint  Patient presents with  . Follow-up    Seen for Dr. Stanford Breed    History of Present Illness:    Kimberly [name] is a 69 y.o. female with a hx of HTN, HLD, fibromyalgia, history of CVA, PAD and recently diagnosed CAD.  Patient had a normal Myoview in January 2013.  She also had a history of left subclavian artery stent and left brachial artery angioplasty, right brachial artery embolectomy, left carotid enterectomy and aortobifemoral bypass.  Most recent carotid Doppler in August 2021 showed 40 to 59% bilateral stenosis.  Doppler in November 2021 showed patent aortobifemoral bypass graft.  She underwent cardiac catheterization 11/19/2019 that showed a 99% ostial to proximal RCA disease, 70% proximal to mid RCA disease, 60% mid LAD disease, 50% proximal left circumflex disease, 30% distal left circumflex disease and 50% D2 lesion, EF 55-65%.  Echocardiogram performed on 11/20/2019 showed EF 60 to 65%, grade 1 DD, no significant valve issue.  Cardiac catheterization performed on 11/22/2019 showed severe ostial and RCA disease treated with 2 separate Resolute Onyx DES.  The plan is for indefinite aspirin and Plavix.  Beta-blocker was not added due to baseline bradycardia.  Although she has a history of intolerance with statins, she was willing to try Crestor 20 mg daily on discharge.  Since discharge, she has been compliant with aspirin and Plavix.  We discussed her recent cardiac catheterization.  She denies any chest discomfort or shortness of breath.  She has no lower extremity edema, orthopnea or PND.  She has cut back on smoking, however continues to smoke to this day.  We strongly urged her to stop smoking at this point.  Patient is tolerating the Crestor. She will need FLP and LFT in 2 month. She can follow-up with Dr. Stanford Breed as previously scheduled near the end of January.   Past Medical History:  Diagnosis Date  . Anemia   . Angina    March 2013  . Anxiety   . Asthma   . Blood transfusion   . CAD (coronary artery disease)    11/22/19- PCI ostial RCA, mRCA  . Depression   . Fibromyalgia   . History of lead poisoning 1977  . Hyperlipidemia   . Hypertension   . Migraine   . Peripheral vascular disease (HCC)    claudication  right greater than left  femoral artery  . Small bowel obstruction, partial ([place]) 04/08/11  . Stroke ([place])   . 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 Uterine cancer [facility])    Pre cervical    Past Surgical History:  Procedure Laterality Date  . abdominal angiogram    . ABDOMINAL ANGIOGRAM N/A 02/05/2011   Procedure: ABDOMINAL ANGIOGRAM;  Surgeon: Serafina Mitchell, MD;  Location[facility] C[facility] LAB;  Service: Cardiovascular;  Laterality: N/A;  . ABDOMINAL HYSTERECTOMY  2008  . AORTA - BILATERAL FEMORAL ARTERY BYPASS GRAFT  02/26/2011   Procedure: AORTA BIFEMORAL BYPASS GRAFT;  Surgeon: Theotis Burrow, MD;  Location: [place];  Service: Vascular;  Laterality: N/A;  . AORTIC ARCH ANGIOGRAPHY N/A 09/16/2017   Procedure: AORTIC ARCH ANGIOGRAPHY;  Surgeon: Serafina Mitchell, MD;  Location: [place] CV LAB;  Service: Cardiovascular;  Laterality: N/A;  . AORTIC ARCH ANGIOGRAPHY N/A 01/19/2019   Procedure: AORTIC ARCH ANGIOGRAPHY;  Surgeon: Serafina Mitchell, MD;  Location: [place] CV LAB;  Service: Cardiovascular;  Laterality: N/A;  Left upper extremity angiography  . ARCH AORTOGRAM N/A 06/24/2012   Procedure: ARCH AORTOGRAM;  Surgeon: Serafina Mitchell, MD;  Location: [facility] CATH LAB;  Service: Cardiovascular;  Laterality: N/A;  . BILATERAL UPPER EXTREMITY ANGIOGRAM Left 08/12/2013   Procedure: UPPER EXTREMITY ANGIOGRAM;  Surgeon: Serafina Mitchell, MD;  Location: [facility] CATH LAB;  Service:  Cardiovascular;  Laterality: Left;  . CAROTID ENDARTERECTOMY Left 06/13/2011   Left  CEA  . CAROTID ENDARTERECTOMY Right 08-13-13   CEA  . CHOLECYSTECTOMY N/A 12/08/2012   Procedure: LAPAROSCOPIC CHOLECYSTECTOMY WITH INTRAOPERATIVE CHOLANGIOGRAM;  Surgeon: Haywood Lasso, MD;  Location: [place];  Service: General;  Laterality: N/A;  . COLONOSCOPY WITH PROPOFOL N/A 06/16/2014   Procedure: COLONOSCOPY WITH PROPOFOL;  Surgeon: Garlan Fair, MD;  Location: [facility] ENDOSCOPY;  Service: Endoscopy;  Laterality: N/A;  . CORONARY STENT INTERVENTION N/A 11/22/2019   Procedure: CORONARY STENT INTERVENTION;  Surgeon: Nelva Bush, MD;  Location: [place] CV LAB;  Service: Cardiovascular;  Laterality: N/A;  . EMBOLECTOMY Left 04/30/2015   Procedure: EMBOLECTOMY BRACHIAL, Radial and ulnar arteries with patch angioplasty of brachial artery.;  Surgeon: Elam Dutch, MD;  Location[facility] OR;  Service: Vascular;  Laterality: Left;  . ENDARTERECTOMY  06/13/2011   Procedure: ENDARTERECTOMY CAROTID;  Surgeon: Serafina Mitchell, MD;  Location: [facility] OR;  Service: Vascular;  Laterality: Left;  Left carotid artery endarterectomy with vascu-guard patch angioplasty  . ENDARTERECTOMY Right 08/13/2013   Procedure: RIGHT  CAROTID ENDARTERECTOMY CAROTID WITH BOVINE PERICARDIAL PATCH ANGIOPLASTY;  Surgeon: Serafina Mitchell, MD;  Location: [place] OR;  Service: Vascular;  Laterality: Right;  . ESOPHAGOGASTRODUODENOSCOPY (EGD) WITH PROPOFOL N/A 06/16/2014   Procedure: ESOPHAGOGASTRODUODENOSCOPY (EGD) WITH PROPOFOL;  Surgeon: Garlan Fair, MD;  Location: [facility] ENDOSCOPY;  Service: Endoscopy;  Laterality: N/A;  . LAPAROTOMY  04/10/2011   Procedure: EXPLORATORY LAPAROTOMY;  Surgeon: Shann Medal, MD;  Location: [place];  Service: General;  Laterality: N/A;  ENTEROLYSIS OF ADHESIONS  . LEFT HEART CATH AND CORONARY ANGIOGRAPHY N/A 11/19/2019   Procedure: LEFT HEART CATH AND CORONARY ANGIOGRAPHY;  Surgeon: [place] Hampshire, MD;  Location: [place] CV LAB;  Service: Cardiovascular;  Laterality: N/A;  . LOWER EXTREMITY ANGIOGRAM Left 06/24/12   Left stent intervention, left subclavian  . PERIPHERAL VASCULAR BALLOON ANGIOPLASTY Left 09/16/2017   Procedure: PERIPHERAL VASCULAR BALLOON ANGIOPLASTY;  Surgeon: Serafina Mitchell, MD;  Location: [place] CV LAB;  Service: Cardiovascular;  Laterality: Left;  brachial artery  . PERIPHERAL VASCULAR BALLOON ANGIOPLASTY Left 01/19/2019   Procedure: PERIPHERAL VASCULAR BALLOON ANGIOPLASTY;  Surgeon: Serafina Mitchell, MD;  Location: [place] CV LAB;  Service: Cardiovascular;  Laterality: Left;  brachial artery  . PERIPHERAL VASCULAR CATHETERIZATION N/A 04/26/2015   Procedure: Aortic Arch Angiography;  Surgeon: Serafina Mitchell, MD;  Location: [place] CV LAB;  Service: Cardiovascular;  Laterality: N/A;  . PERIPHERAL VASCULAR CATHETERIZATION  04/26/2015   Procedure: Peripheral Vascular Intervention;  Surgeon: Serafina Mitchell, MD;  Location: [place] CV LAB;  Service: Cardiovascular;;  lt illiac  . PERIPHERAL VASCULAR CATHETERIZATION N/A 12/05/2015   Procedure: Aortic Arch Angiography;  Surgeon: Serafina Mitchell, MD;  Location: [place] CV LAB;  Service: Cardiovascular;  Laterality: N/A;  . PERIPHERAL VASCULAR CATHETERIZATION Left 12/05/2015  Procedure: Peripheral Vascular Intervention;  Surgeon: Nada Libman, MD;  Location: [facility] INVAS[facility] CV LAB;  Service: Cardiovascular;  Laterality: Left;  subclavian  . PERIPHERAL VASCULAR INTERVENTION Left 09/16/2017   Procedure: PERIPHERAL VASCULAR INTERVENTION;  Surgeon: Nada Libman, MD;  Location: [facility] INVASIVE CV LAB;  Service: Cardiovascular;  Laterality: Left;  subclavian artery  . PERIPHERAL VASCULAR INTERVENTION Left 01/19/2019   Procedure: PERIPHERAL VASCULAR INTERVENTION;  Surgeon: Nada Libman, MD;  Location: [facility] INVASIVE CV LAB;  Service: Cardiovascular;  Laterality: Left;  subclavian stent  . PR VEIN BYPASS GRAFT,AORTO-FEM-POP  02/26/11   . UPPER EXTREMITY ANGIOGRAPHY Left 09/16/2017   Procedure: Upper Extremity Angiography;  Surgeon: Nada Libman, MD;  Location: [facility] INVASIVE CV LAB;  Service: Cardiovascular;  Laterality: Left;    Current Medications: Current Meds  Medication Sig  . acetaminophen (TYLENOL) 650 MG CR tablet Take 1,300 mg by mouth every 8 (eight) hours as needed for pain.  . Aromatic Inhalants (VICKS VAPOINHALER IN) Inhale 1 puff into the lungs daily as needed (congestion).  . Ascorbic Acid (VITAMIN C) 1000 MG tablet Take 1,000 mg by mouth daily.  . ASPERCREME LIDOCAINE EX Apply 1 application topically daily as needed (back pain).  Marland Kitchen aspirin EC 81 MG EC tablet Take 1 tablet (81 mg total) by mouth daily. Swallow whole.  . Cholecalciferol (VITAMIN D3) 50 MCG (2000 UT) TABS Take 2,000 Units by mouth daily.   . clopidogrel (PLAVIX) 75 MG tablet TAKE ONE TABLET BY MOUTH AT BEDTIME (Patient taking differently: Take 75 mg by mouth at bedtime.)  . Cyanocobalamin (VITAMIN B-12) 5000 MCG TBDP Take 5,000 mcg by mouth daily.   . diphenhydrAMINE-zinc acetate (BENADRYL) cream Apply 1 application topically daily as needed for itching.  . ferrous sulfate 325 (65 FE) MG tablet Take 325 mg by mouth every Monday, Wednesday, and Friday.   Marland Kitchen lisinopril (ZESTRIL) 5 MG tablet Take 1 tablet (5 mg total) by mouth daily.  Marland Kitchen loratadine (CLARITIN) 10 MG tablet Take 10 mg by mouth daily as needed for allergies.   . nitroGLYCERIN (NITROSTAT) 0.4 MG SL tablet Place 1 tablet (0.4 mg total) under the tongue every 5 (five) minutes as needed.  Marland Kitchen OVER THE COUNTER MEDICATION Take 1 tablet by mouth every other day. MK-7  (vit K + magnesium)  . rosuvastatin (CRESTOR) 20 MG tablet Take 1 tablet (20 mg total) by mouth daily.     Allergies:   Codeine, Hydrocodone, Other, Oxycodone, and Atorvastatin   Social History   Socioeconomic History  . Marital status: Legally Separated    Spouse name: Not on file  . Number of children: 2  . Years of  education: Not on file  . Highest education level: Not on file  Occupational History  . Not on file  Tobacco Use  . Smoking status: Current Some Day Smoker    Packs/day: 0.10    Years: 45.00    Pack years: 4.50    Types: Cigarettes  . Smokeless tobacco: Never Used  . Tobacco comment: pt states that she smokes about 6 cigs per day  Vaping Use  . Vaping Use: Never used  Substance and Sexual Activity  . Alcohol use: No    Alcohol/week: 0.0 standard drinks  . Drug use: No    Comment: History of Cocaine and marijuana abuse: "not in many years" (04/08/11  . Sexual activity: Not Currently  Other Topics Concern  . Not on file  Social History Narrative   Pt lives at  home with her 2 grandsons. She has been separated for 57yrs, has two children, and has a 12th grade education level. She drinks 3 cups of coffee daily.   Social Determinants of Health   Financial Resource Strain: Not on file  Food Insecurity: Not on file  Transportation Needs: Not on file  Physical Activity: Not on file  Stress: Not on file  Social Connections: Not on file     Family History: The patient's family history includes Cancer in her maternal grandmother, mother, and another family member; Cancer (age of onset: 34) in her brother; Hyperlipidemia in her brother; Stroke in an other family member. There is no history of Anesthesia problems.  ROS:   Please see the history of present illness.     All other systems reviewed and are negative.  EKGs/Labs/Other Studies Reviewed:    The following studies were reviewed today:  Echo 11/20/2019 IMPRESSIONS    1. Left ventricular ejection fraction, by estimation, is 60 to 65%. The  left ventricle has normal function. The left ventricle has no regional  wall motion abnormalities. Left ventricular diastolic parameters are  consistent with Grade I diastolic  dysfunction (impaired relaxation).  2. Right ventricular systolic function is normal. The right ventricular   size is normal. Tricuspid regurgitation signal is inadequate for assessing  PA pressure.  3. The mitral valve is normal in structure. Trivial mitral valve  regurgitation. No evidence of mitral stenosis.  4. The aortic valve is tricuspid. Aortic valve regurgitation is not  visualized. Mild aortic valve sclerosis is present, with no evidence of  aortic valve stenosis.  5. The inferior vena cava is normal in size with greater than 50%  respiratory variability, suggesting right atrial pressure of 3 mmHg.  EKG:  EKG is ordered today.  The ekg ordered today demonstrates normal sinus rhythm, incomplete right bundle branch block, otherwise no significant ST-T wave changes.  Recent Labs: 11/23/2019: BUN 14; Creatinine, Ser 0.94; Hemoglobin 13.8; Platelets 174; Potassium 3.8; Sodium 138  Recent Lipid Panel    Component Value Date/Time   CHOL 210 (H) 11/21/2019 0312   TRIG 100 11/21/2019 0312   HDL 38 (L) 11/21/2019 0312   CHOLHDL 5.5 11/21/2019 0312   VLDL 20 11/21/2019 0312   LDLCALC 152 (H) 11/21/2019 0312     Risk Assessment/Calculations:       Physical Exam:    VS:  BP 132/86   Pulse 61   Ht 5\' 4"  (1.626 m)   Wt 118 lb (53.5 kg)   SpO2 97%   BMI 20.25 kg/m     Wt Readings from Last 3 Encounters:  01/04/20 118 lb (53.5 kg)  11/23/19 113 lb 8 oz (51.5 kg)  11/17/19 117 lb 9.6 oz (53.3 kg)     GEN:  Well nourished, well developed in no acute distress HEENT: Normal NECK: No JVD; No carotid bruits LYMPHATICS: No lymphadenopathy CARDIAC: RRR, no murmurs, rubs, gallops RESPIRATORY:  Clear to auscultation without rales, wheezing or rhonchi  ABDOMEN: Soft, non-tender, non-distended MUSCULOSKELETAL:  No edema; No deformity  SKIN: Warm and dry NEUROLOGIC:  Alert and oriented x 3 PSYCHIATRIC:  Normal affect   ASSESSMENT:    1. Coronary artery disease involving native coronary artery of native heart without angina pectoris   2. Hyperlipidemia, unspecified hyperlipidemia  type   3. Primary hypertension   4. H/O: CVA (cerebrovascular accident)   5. PAD (peripheral artery disease) (HCC)    PLAN:    In order of problems listed  above:  1. CAD: s/p recent PCI. Denies any recent chest pain.  Continue aspirin and Plavix.  Plan for indefinite dual antiplatelet therapy.  2. Hyperlipidemia: Tolerating the current dose of Crestor.  Fasting lipid panel and LFT in 6 to 8 weeks  3. Hypertension: Blood pressure stable  4. History of CVA: No recent recurrence  5. History of PAD: history of left subclavian stent.  Denies any symptoms.    Medication Adjustments/Labs and Tests Ordered: Current medicines are reviewed at length with the patient today.  Concerns regarding medicines are outlined above.  Orders Placed This Encounter  Procedures  . Lipid panel  . Hepatic function panel  . EKG 12-Lead   No orders of the defined types were placed in this encounter.   Patient Instructions  Medication Instructions:  Your physician recommends that you continue on your current medications as directed. Please refer to the Current Medication list given to you today.  *If you need a refill on your cardiac medications before your next appointment, please call your pharmacy*  Lab Work: Your physician recommends that you return for lab work in 1-2 MONTHS:  Fasting Lipid Panel-DO NOT EAT OR DRINK PAST MIDNIGHT. OKSY TO HAVE WATER.  Hepatic (Liver) Function Test.  If you have labs (blood work) drawn today and your tests are completely normal, you will receive your results only by: Marland Kitchen MyChart Message (if you have MyChart) OR . A paper copy in the mail If you have any lab test that is abnormal or we need to change your treatment, we will call you to review the results.  Testing/Procedures: NONE ordered at this time of appointment   Follow-Up: At [facility], you and your health needs are our priority.  As part of our continuing mission to provide you with exceptional  heart care, we have created designated Provider Care Teams.  These Care Teams include your primary Cardiologist (physician) and Advanced Practice Providers (APPs -  Physician Assistants and Nurse Practitioners) who all work together to provide you with the care you need, when you need it.  Your next appointment:   Follow up as scheduled    The format for your next appointment:   In Person  Provider:   Kirk Ruths, MD  Other Instructions      Signed, Almyra Deforest, [place]  01/05/2020 9:43 PM    [place]

## 2020-01-05 ENCOUNTER — Encounter: Payer: Self-pay | Admitting: Physician Assistant

## 2020-01-12 ENCOUNTER — Telehealth (HOSPITAL_COMMUNITY): Payer: Self-pay

## 2020-01-12 NOTE — Telephone Encounter (Signed)
Called pt to see if she was interested in the cardiac rehab program, pt stated that she couldn't afford the program and declined. I advised the pt that she has up to a year to do the program and to call if she changes her mind. Closed referral. 

## 2020-01-12 NOTE — Telephone Encounter (Signed)
Called pt to see if she was interested in the cardiac rehab program, pt stated that she couldn't afford the program and declined. I advised the pt that she has up to a year to do the program and to call if she changes her mind. Closed referral.

## 2020-01-25 NOTE — Progress Notes (Deleted)
HPI: FU CAD. Patient does have a history of peripheral vascular disease and has had previous left subclavian artery stent and left brachial artery angioplasty, right brachial artery embolectomy, previous left carotid endarterectomy and aortobifemoral bypass graft.  Most recent carotid Dopplers August 2021 showed 40 to 59% bilateral stenosis.  Dopplers November 2021 showed patent aortobifemoral bypass graft.  ABIs mild on the right and moderate on the left.    Cardiac catheterization November 2021 showed 99% ostial RCA, 70% mid RCA, 50% circumflex, 60% mid LAD and 50% second diagonal.  LV function was normal.  Patient had PCI of the RCA with 2 drug-eluting stents.  Echocardiogram November 2021 showed normal LV function, grade 1 diastolic dysfunction.  Since last seen  Current Outpatient Medications  Medication Sig Dispense Refill  . acetaminophen (TYLENOL) 650 MG CR tablet Take 1,300 mg by mouth every 8 (eight) hours as needed for pain.    Marland Kitchen albuterol (PROVENTIL HFA;VENTOLIN HFA) 108 (90 Base) MCG/ACT inhaler Inhale 1-2 puffs into the lungs every 6 (six) hours as needed for wheezing or shortness of breath. 1 Inhaler 0  . Aromatic Inhalants (VICKS VAPOINHALER IN) Inhale 1 puff into the lungs daily as needed (congestion).    . Ascorbic Acid (VITAMIN C) 1000 MG tablet Take 1,000 mg by mouth daily.    . ASPERCREME LIDOCAINE EX Apply 1 application topically daily as needed (back pain).    Marland Kitchen aspirin EC 81 MG EC tablet Take 1 tablet (81 mg total) by mouth daily. Swallow whole. 30 tablet 11  . Cholecalciferol (VITAMIN D3) 50 MCG (2000 UT) TABS Take 2,000 Units by mouth daily.     . clopidogrel (PLAVIX) 75 MG tablet TAKE ONE TABLET BY MOUTH AT BEDTIME (Patient taking differently: Take 75 mg by mouth at bedtime.) 90 tablet 2  . Cyanocobalamin (VITAMIN B-12) 5000 MCG TBDP Take 5,000 mcg by mouth daily.     . diphenhydrAMINE-zinc acetate (BENADRYL) cream Apply 1 application topically daily as needed for  itching.    . ferrous sulfate 325 (65 FE) MG tablet Take 325 mg by mouth every Monday, Wednesday, and Friday.     Marland Kitchen lisinopril (ZESTRIL) 5 MG tablet Take 1 tablet (5 mg total) by mouth daily. 30 tablet 2  . loratadine (CLARITIN) 10 MG tablet Take 10 mg by mouth daily as needed for allergies.     . nitroGLYCERIN (NITROSTAT) 0.4 MG SL tablet Place 1 tablet (0.4 mg total) under the tongue every 5 (five) minutes as needed. 25 tablet 2  . OVER THE COUNTER MEDICATION Take 1 tablet by mouth every other day. MK-7  (vit K + magnesium)    . rosuvastatin (CRESTOR) 20 MG tablet Take 1 tablet (20 mg total) by mouth daily. 30 tablet 2   No current facility-administered medications for this visit.     Past Medical History:  Diagnosis Date  . Anemia   . Angina    March 2013  . Anxiety   . Asthma   . Blood transfusion   . CAD (coronary artery disease)    11/22/19- PCI ostial RCA, mRCA  . Depression   . Fibromyalgia   . History of lead poisoning 1977  . Hyperlipidemia   . Hypertension   . Migraine   . Peripheral vascular disease (HCC)    claudication  right greater than left  femoral artery  . Small bowel obstruction, partial ([place]) 04/08/11  . Stroke ([place])   . 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 Uterine cancer [facility])    Pre cervical    Past Surgical History:  Procedure Laterality Date  . abdominal angiogram    . ABDOMINAL ANGIOGRAM N/A 02/05/2011   Procedure: ABDOMINAL ANGIOGRAM;  Surgeon: Serafina Mitchell, MD;  Location: [facility] CATH LAB;  Service: Cardiovascular;  Laterality: N/A;  . ABDOMINAL HYSTERECTOMY  2008  . AORTA - BILATERAL FEMORAL ARTERY BYPASS GRAFT  02/26/2011   Procedure: AORTA BIFEMORAL BYPASS GRAFT;  Surgeon: Theotis Burrow, MD;  Location: [place];  Service: Vascular;  Laterality: N/A;  . AORTIC ARCH ANGIOGRAPHY N/A 09/16/2017   Procedure: AORTIC ARCH ANGIOGRAPHY;  Surgeon: Serafina Mitchell, MD;  Location: [place] CV LAB;  Service: Cardiovascular;  Laterality: N/A;  .  AORTIC ARCH ANGIOGRAPHY N/A 01/19/2019   Procedure: AORTIC ARCH ANGIOGRAPHY;  Surgeon: Serafina Mitchell, MD;  Location: [place] CV LAB;  Service: Cardiovascular;  Laterality: N/A;  Left upper extremity angiography  . ARCH AORTOGRAM N/A 06/24/2012   Procedure: ARCH AORTOGRAM;  Surgeon: Serafina Mitchell, MD;  Location: [facility] CATH LAB;  Service: Cardiovascular;  Laterality: N/A;  . BILATERAL UPPER EXTREMITY ANGIOGRAM Left 08/12/2013   Procedure: UPPER EXTREMITY ANGIOGRAM;  Surgeon: Serafina Mitchell, MD;  Location: [facility] CATH LAB;  Service: Cardiovascular;  Laterality: Left;  . CAROTID ENDARTERECTOMY Left 06/13/2011   Left  CEA  . CAROTID ENDARTERECTOMY Right 08-13-13   CEA  . CHOLECYSTECTOMY N/A 12/08/2012   Procedure: LAPAROSCOPIC CHOLECYSTECTOMY WITH INTRAOPERATIVE CHOLANGIOGRAM;  Surgeon: Haywood Lasso, MD;  Location: [place];  Service: General;  Laterality: N/A;  . COLONOSCOPY WITH PROPOFOL N/A 06/16/2014   Procedure: COLONOSCOPY WITH PROPOFOL;  Surgeon: Garlan Fair, MD;  Location: [facility] ENDOSCOPY;  Service: Endoscopy;  Laterality: N/A;  . CORONARY STENT INTERVENTION N/A 11/22/2019   Procedure: CORONARY STENT INTERVENTION;  Surgeon: Nelva Bush, MD;  Location: [place] CV LAB;  Service: Cardiovascular;  Laterality: N/A;  . EMBOLECTOMY Left 04/30/2015   Procedure: EMBOLECTOMY BRACHIAL, Radial and ulnar arteries with patch angioplasty of brachial artery.;  Surgeon: Elam Dutch, MD;  Location: [facility] OR;  Service: Vascular;  Laterality: Left;  . ENDARTERECTOMY  06/13/2011   Procedure: ENDARTERECTOMY CAROTID;  Surgeon: Serafina Mitchell, MD;  Location: [facility] OR;  Service: Vascular;  Laterality: Left;  Left carotid artery endarterectomy with vascu-guard patch angioplasty  . ENDARTERECTOMY Right 08/13/2013   Procedure: RIGHT  CAROTID ENDARTERECTOMY CAROTID WITH BOVINE PERICARDIAL PATCH ANGIOPLASTY;  Surgeon: Serafina Mitchell, MD;  Location: [place] OR;  Service: Vascular;  Laterality: Right;  .  ESOPHAGOGASTRODUODENOSCOPY (EGD) WITH PROPOFOL N/A 06/16/2014   Procedure: ESOPHAGOGASTRODUODENOSCOPY (EGD) WITH PROPOFOL;  Surgeon: Garlan Fair, MD;  [facility];  Service: Endoscopy;  Laterality: N/A;  . LAPAROTOMY  04/10/2011   Procedure: EXPLORATORY LAPAROTOMY;  Surgeon: Shann Medal, MD;  Location: [place];  Service: General;  Laterality: N/A;  ENTEROLYSIS OF ADHESIONS  . LEFT HEART CATH AND CORONARY ANGIOGRAPHY N/A 11/19/2019   Procedure: LEFT HEART CATH AND CORONARY ANGIOGRAPHY;  Surgeon: Wellington Hampshire, MD;  Location: [place] CV LAB;  Service: Cardiovascular;  Laterality: N/A;  . LOWER EXTREMITY ANGIOGRAM Left 06/24/12   Left stent intervention, left subclavian  . PERIPHERAL VASCULAR BALLOON ANGIOPLASTY Left 09/16/2017   Procedure: PERIPHERAL VASCULAR BALLOON ANGIOPLASTY;  Surgeon: Serafina Mitchell, MD;  Location: [place] CV LAB;  Service: Cardiovascular;  Laterality: Left;  brachial artery  . PERIPHERAL VASCULAR BALLOON ANGIOPLASTY Left 01/19/2019   Procedure: PERIPHERAL VASCULAR BALLOON ANGIOPLASTY;  Surgeon: Serafina Mitchell, MD;  Location: [place] CV LAB;  Service: Cardiovascular;  Laterality: Left;  brachial artery  . PERIPHERAL VASCULAR CATHETERIZATION N/A 04/26/2015   Procedure: Aortic Arch Angiography;  Surgeon: Serafina Mitchell, MD;  Location: [place] CV LAB;  Service: Cardiovascular;  Laterality: N/A;  . PERIPHERAL VASCULAR CATHETERIZATION  04/26/2015   Procedure: Peripheral Vascular Intervention;  Surgeon: Serafina Mitchell, MD;  Location: [place] CV LAB;  Service: Cardiovascular;;  lt illiac  . PERIPHERAL VASCULAR CATHETERIZATION N/A 12/05/2015   Procedure: Aortic Arch Angiography;  Surgeon: Serafina Mitchell, MD;  Location: [place] CV LAB;  Service: Cardiovascular;  Laterality: N/A;  . PERIPHERAL VASCULAR CATHETERIZATION Left 12/05/2015   Procedure: Peripheral Vascular Intervention;  Surgeon: Serafina Mitchell, MD;  Location: [place] CV LAB;   Service: Cardiovascular;  Laterality: Left;  subclavian  . PERIPHERAL VASCULAR INTERVENTION Left 09/16/2017   Procedure: PERIPHERAL VASCULAR INTERVENTION;  Surgeon: Serafina Mitchell, MD;  Location: [place] CV LAB;  Service: Cardiovascular;  Laterality: Left;  subclavian artery  . PERIPHERAL VASCULAR INTERVENTION Left 01/19/2019   Procedure: PERIPHERAL VASCULAR INTERVENTION;  Surgeon: Serafina Mitchell, MD;  Location: [place] CV LAB;  Service: Cardiovascular;  Laterality: Left;  subclavian stent  . PR VEIN BYPASS GRAFT,AORTO-FEM-POP  02/26/11  . UPPER EXTREMITY ANGIOGRAPHY Left 09/16/2017   Procedure: Upper Extremity Angiography;  Surgeon: Serafina Mitchell, MD;  Location: [place] CV LAB;  Service: Cardiovascular;  Laterality: Left;    Social History   Socioeconomic History  . Marital status: Legally Separated    Spouse name: Not on file  . Number of children: 2  . Years of education: Not on file  . Highest education level: Not on file  Occupational History  . Not on file  Tobacco Use  . Smoking status: Current Some Day Smoker    Packs/day: 0.10    Years: 45.00    Pack years: 4.50    Types: Cigarettes  . Smokeless tobacco: Never Used  . Tobacco comment: pt states that she smokes about 6 cigs per day  Vaping Use  . Vaping Use: Never used  Substance and Sexual Activity  . Alcohol use: No    Alcohol/week: 0.0 standard drinks  . Drug use: No    Comment: History of Cocaine and marijuana abuse: "not in many years" (04/08/11  . Sexual activity: Not Currently  Other Topics Concern  . Not on file  Social History Narrative   Pt lives at home with her 2 grandsons. She has been separated for 34yrs, has two children, and has a 12th grade education level. She drinks 3 cups of coffee daily.   Social Determinants of Health   Financial Resource Strain: Not on file  Food Insecurity: Not on file  Transportation Needs: Not on file  Physical Activity: Not on file  Stress: Not on file   Social Connections: Not on file  Intimate Partner Violence: Not on file    Family History  Problem Relation Age of Onset  . Cancer Mother        breast, colon, ovarian  . Cancer Brother 50       lung  . Hyperlipidemia Brother   . Cancer Other   . Stroke Other   . Cancer Maternal Grandmother        ovarian  . Anesthesia problems Neg Hx     ROS: no fevers or chills, productive cough, hemoptysis, dysphasia, odynophagia, melena, hematochezia, dysuria, hematuria, rash, seizure activity,  orthopnea, PND, pedal edema, claudication. Remaining systems are negative.  Physical Exam: Well-developed well-nourished in no acute distress.  Skin is warm and dry.  HEENT is normal.  Neck is supple.  Chest is clear to auscultation with normal expansion.  Cardiovascular exam is regular rate and rhythm.  Abdominal exam nontender or distended. No masses palpated. Extremities show no edema. neuro grossly intact  ECG- personally reviewed  A/P  1 coronary artery disease status post PCI of RCA-patient denies recurrent chest pain.  Plan to continue medical therapy with aspirin, Plavix and statin.  Note dual antiplatelet therapy recommended indefinitely.  2 peripheral vascular disease-continue present medications.  Followed by vascular surgery.  3 hyperlipidemia-patient did not tolerate high-dose statins previously.  We will continue Crestor.  Add Zetia 10 mg daily.  Check lipids and liver in 12 weeks.  4 hypertension-blood pressure controlled.  Continue present medical regimen.  5 tobacco abuse-patient counseled on discontinuing.  Kirk Ruths, MD

## 2020-01-31 ENCOUNTER — Ambulatory Visit: Payer: Medicare HMO | Admitting: Cardiology

## 2020-02-17 ENCOUNTER — Other Ambulatory Visit: Payer: Self-pay | Admitting: Cardiology

## 2020-02-17 NOTE — Telephone Encounter (Signed)
This is Dr. Crenshaw's pt 

## 2020-04-06 ENCOUNTER — Other Ambulatory Visit: Payer: Self-pay | Admitting: *Deleted

## 2020-04-06 MED ORDER — CLOPIDOGREL BISULFATE 75 MG PO TABS
75.0000 mg | ORAL_TABLET | Freq: Every day | ORAL | 1 refills | Status: DC
Start: 2020-04-06 — End: 2020-10-18

## 2020-04-06 NOTE — Progress Notes (Signed)
HPI: FU CAD.  Nuclear study January 2013 normal with ejection fraction 79%.  Patient does have a history of peripheral vascular disease and has had previous left subclavian artery stent and left brachial artery angioplasty, right brachial artery embolectomy, previous left carotid endarterectomy and aortobifemoral bypass graft.  Most recent carotid Dopplers August 2021 showed 40 to 59% bilateral stenosis.  Dopplers November 2021 showed patent aortobifemoral bypass graft.  ABIs mild on the right and moderate on the left.  Cardiac catheterization November 2021 showed 99% ostial right coronary artery, 70% proximal right coronary artery, 50% circumflex, 60% mid LAD and 50% second diagonal.  Ejection fraction 55 to 65%.  Patient subsequently had PCI of the right coronary artery with drug-eluting stents x2.  Echocardiogram November 2021 showed normal LV function, grade 1 diastolic dysfunction.  Since last seen she denies increased dyspnea, chest pain, palpitations or syncope.  No claudication.  Current Outpatient Medications  Medication Sig Dispense Refill  . acetaminophen (TYLENOL) 650 MG CR tablet Take 1,300 mg by mouth every 8 (eight) hours as needed for pain.    Marland Kitchen albuterol (PROVENTIL HFA;VENTOLIN HFA) 108 (90 Base) MCG/ACT inhaler Inhale 1-2 puffs into the lungs every 6 (six) hours as needed for wheezing or shortness of breath. 1 Inhaler 0  . Aromatic Inhalants (VICKS VAPOINHALER IN) Inhale 1 puff into the lungs daily as needed (congestion).    . Ascorbic Acid (VITAMIN C) 1000 MG tablet Take 1,000 mg by mouth daily.    . ASPERCREME LIDOCAINE EX Apply 1 application topically daily as needed (back pain).    Marland Kitchen aspirin EC 81 MG EC tablet Take 1 tablet (81 mg total) by mouth daily. Swallow whole. 30 tablet 11  . Cholecalciferol (VITAMIN D3) 50 MCG (2000 UT) TABS Take 2,000 Units by mouth daily.     . clopidogrel (PLAVIX) 75 MG tablet Take 1 tablet (75 mg total) by mouth at bedtime. 90 tablet 1  .  Cyanocobalamin (VITAMIN B-12) 5000 MCG TBDP Take 5,000 mcg by mouth daily.     . diphenhydrAMINE-zinc acetate (BENADRYL) cream Apply 1 application topically daily as needed for itching.    . ferrous sulfate 325 (65 FE) MG tablet Take 325 mg by mouth every Monday, Wednesday, and Friday.     Marland Kitchen lisinopril (ZESTRIL) 5 MG tablet TAKE ONE TABLET BY MOUTH DAILY 90 tablet 0  . loratadine (CLARITIN) 10 MG tablet Take 10 mg by mouth daily as needed for allergies.     . nitroGLYCERIN (NITROSTAT) 0.4 MG SL tablet Place 1 tablet (0.4 mg total) under the tongue every 5 (five) minutes as needed. 25 tablet 2  . OVER THE COUNTER MEDICATION Take 1 tablet by mouth every other day. MK-7  (vit K + magnesium)    . rosuvastatin (CRESTOR) 20 MG tablet TAKE ONE TABLET BY MOUTH DAILY 90 tablet 0   No current facility-administered medications for this visit.     Past Medical History:  Diagnosis Date  . Anemia   . Angina    March 2013  . Anxiety   . Asthma   . Blood transfusion   . CAD (coronary artery disease)    11/22/19- PCI ostial RCA, mRCA  . Depression   . Fibromyalgia   . History of lead poisoning 1977  . Hyperlipidemia   . Hypertension   . Migraine   . Peripheral vascular disease (HCC)    claudication  right greater than left  femoral artery  . Small bowel obstruction,  partial ([place]) 04/08/11  . Stroke ([place])   . Subclavian artery disease ([place])    left  . Uterine cancer [facility])    Pre cervical    Past Surgical History:  Procedure Laterality Date  . abdominal angiogram    . ABDOMINAL ANGIOGRAM N/A 02/05/2011   Procedure: ABDOMINAL ANGIOGRAM;  Surgeon: Serafina Mitchell, MD;  Location: [facility] CATH LAB;  Service: Cardiovascular;  Laterality: N/A;  . ABDOMINAL HYSTERECTOMY  2008  . AORTA - BILATERAL FEMORAL ARTERY BYPASS GRAFT  02/26/2011   Procedure: AORTA BIFEMORAL BYPASS GRAFT;  Surgeon: Theotis Burrow, MD;  Location: [place];  Service: Vascular;  Laterality: N/A;  . AORTIC ARCH ANGIOGRAPHY N/A 09/16/2017    Procedure: AORTIC ARCH ANGIOGRAPHY;  Surgeon: Serafina Mitchell, MD;  Location: [place] CV LAB;  Service: Cardiovascular;  Laterality: N/A;  . AORTIC ARCH ANGIOGRAPHY N/A 01/19/2019   Procedure: AORTIC ARCH ANGIOGRAPHY;  Surgeon: Serafina Mitchell, MD;  Location: [place] CV LAB;  Service: Cardiovascular;  Laterality: N/A;  Left upper extremity angiography  . ARCH AORTOGRAM N/A 06/24/2012   Procedure: ARCH AORTOGRAM;  Surgeon: Serafina Mitchell, MD;  Location: [facility] CATH LAB;  Service: Cardiovascular;  Laterality: N/A;  . BILATERAL UPPER EXTREMITY ANGIOGRAM Left 08/12/2013   Procedure: UPPER EXTREMITY ANGIOGRAM;  Surgeon: Serafina Mitchell, MD;  Location: [facility] CATH LAB;  Service: Cardiovascular;  Laterality: Left;  . CAROTID ENDARTERECTOMY Left 06/13/2011   Left  CEA  . CAROTID ENDARTERECTOMY Right 08-13-13   CEA  . CHOLECYSTECTOMY N/A 12/08/2012   Procedure: LAPAROSCOPIC CHOLECYSTECTOMY WITH INTRAOPERATIVE CHOLANGIOGRAM;  Surgeon: Haywood Lasso, MD;  Location: [place];  Service: General;  Laterality: N/A;  . COLONOSCOPY WITH PROPOFOL N/A 06/16/2014   Procedure: COLONOSCOPY WITH PROPOFOL;  Surgeon: Garlan Fair, MD;  Location[facility] ENDOS[facility];  Service: Endoscopy;  Laterality: N/A;  . CORONARY STENT INTERVENTION N/A 11/22/2019   Procedure: CORONARY STENT INTERVENTION;  Surgeon: Nelva Bush, MD;  Location: [place] CV LAB;  Service: Cardiovascular;  Laterality: N/A;  . EMBOLECTOMY Left 04/30/2015   Procedure: EMBOLECTOMY BRACHIAL, Radial and ulnar arteries with patch angioplasty of brachial artery.;  Surgeon: Elam Dutch, MD;  Location: [facility] OR;  Service: Vascular;  Laterality: Left;  . ENDARTERECTOMY  06/13/2011   Procedure: ENDARTERECTOMY CAROTID;  Surgeon: Serafina Mitchell, MD;  Location[facility] OR;  Service: Vascular;  Laterality: Left;  Left carotid artery endarterectomy with vascu-guard patch angioplasty  . ENDARTERECTOMY Right 08/13/2013   Procedure: RIGHT  CAROTID ENDARTERECTOMY CAROTID WITH  BOVINE PERICARDIAL PATCH ANGIOPLASTY;  Surgeon: Serafina Mitchell, MD;  Location: [place] OR;  Service: Vascular;  Laterality: Right;  . ESOPHAGOGASTRODUODENOSCOPY (EGD) WITH PROPOFOL N/A 06/16/2014   Procedure: ESOPHAGOGASTRODUODENOSCOPY (EGD) WITH PROPOFOL;  Surgeon: Garlan Fair, MD;  [facility];  Service: Endoscopy;  Laterality: N/A;  . LAPAROTOMY  04/10/2011   Procedure: EXPLORATORY LAPAROTOMY;  Surgeon: Shann Medal, MD;  Location: [place];  Service: General;  Laterality: N/A;  ENTEROLYSIS OF ADHESIONS  . LEFT HEART CATH AND CORONARY ANGIOGRAPHY N/A 11/19/2019   Procedure: LEFT HEART CATH AND CORONARY ANGIOGRAPHY;  Surgeon: Wellington Hampshire, MD;  Location: [place] CV LAB;  Service: Cardiovascular;  Laterality: N/A;  . LOWER EXTREMITY ANGIOGRAM Left 06/24/12   Left stent intervention, left subclavian  . PERIPHERAL VASCULAR BALLOON ANGIOPLASTY Left 09/16/2017   Procedure: PERIPHERAL VASCULAR BALLOON ANGIOPLASTY;  Surgeon: Serafina Mitchell, MD;  Location: [place] CV LAB;  Service: Cardiovascular;  Laterality: Left;  brachial  artery  . PERIPHERAL VASCULAR BALLOON ANGIOPLASTY Left 01/19/2019   Procedure: PERIPHERAL VASCULAR BALLOON ANGIOPLASTY;  Surgeon: Serafina Mitchell, MD;  Location: [place] CV LAB;  Service: Cardiovascular;  Laterality: Left;  brachial artery  . PERIPHERAL VASCULAR CATHETERIZATION N/A 04/26/2015   Procedure: Aortic Arch Angiography;  Surgeon: Serafina Mitchell, MD;  Location: [place] CV LAB;  Service: Cardiovascular;  Laterality: N/A;  . PERIPHERAL VASCULAR CATHETERIZATION  04/26/2015   Procedure: Peripheral Vascular Intervention;  Surgeon: Serafina Mitchell, MD;  Location: [place] CV LAB;  Service: Cardiovascular;;  lt illiac  . PERIPHERAL VASCULAR CATHETERIZATION N/A 12/05/2015   Procedure: Aortic Arch Angiography;  Surgeon: Serafina Mitchell, MD;  Location: [place] CV LAB;  Service: Cardiovascular;  Laterality: N/A;  . PERIPHERAL VASCULAR CATHETERIZATION  Left 12/05/2015   Procedure: Peripheral Vascular Intervention;  Surgeon: Serafina Mitchell, MD;  Location: [place] CV LAB;  Service: Cardiovascular;  Laterality: Left;  subclavian  . PERIPHERAL VASCULAR INTERVENTION Left 09/16/2017   Procedure: PERIPHERAL VASCULAR INTERVENTION;  Surgeon: Serafina Mitchell, MD;  Location: [place] CV LAB;  Service: Cardiovascular;  Laterality: Left;  subclavian artery  . PERIPHERAL VASCULAR INTERVENTION Left 01/19/2019   Procedure: PERIPHERAL VASCULAR INTERVENTION;  Surgeon: Serafina Mitchell, MD;  Location: [place] CV LAB;  Service: Cardiovascular;  Laterality: Left;  subclavian stent  . PR VEIN BYPASS GRAFT,AORTO-FEM-POP  02/26/11  . UPPER EXTREMITY ANGIOGRAPHY Left 09/16/2017   Procedure: Upper Extremity Angiography;  Surgeon: Serafina Mitchell, MD;  Location: [place] CV LAB;  Service: Cardiovascular;  Laterality: Left;    Social History   Socioeconomic History  . Marital status: Legally Separated    Spouse name: Not on file  . Number of children: 2  . Years of education: Not on file  . Highest education level: Not on file  Occupational History  . Not on file  Tobacco Use  . Smoking status: Current Some Day Smoker    Packs/day: 0.10    Years: 45.00    Pack years: 4.50    Types: Cigarettes  . Smokeless tobacco: Never Used  . Tobacco comment: pt states that she smokes about 6 cigs per day  Vaping Use  . Vaping Use: Never used  Substance and Sexual Activity  . Alcohol use: No    Alcohol/week: 0.0 standard drinks  . Drug use: No    Comment: History of Cocaine and marijuana abuse: "not in many years" (04/08/11  . Sexual activity: Not Currently  Other Topics Concern  . Not on file  Social History Narrative   Pt lives at home with her 2 grandsons. She has been separated for 64yrs, has two children, and has a 12th grade education level. She drinks 3 cups of coffee daily.   Social Determinants of Health   Financial Resource Strain: Not on file   Food Insecurity: Not on file  Transportation Needs: Not on file  Physical Activity: Not on file  Stress: Not on file  Social Connections: Not on file  Intimate Partner Violence: Not on file    Family History  Problem Relation Age of Onset  . Cancer Mother        breast, colon, ovarian  . Cancer Brother 50       lung  . Hyperlipidemia Brother   . Cancer Other   . Stroke Other   . Cancer Maternal Grandmother        ovarian  . Anesthesia problems Neg Hx  ROS: no fevers or chills, productive cough, hemoptysis, dysphasia, odynophagia, melena, hematochezia, dysuria, hematuria, rash, seizure activity, orthopnea, PND, pedal edema, claudication. Remaining systems are negative.  Physical Exam: Well-developed well-nourished in no acute distress.  Skin is warm and dry.  HEENT is normal.  Neck is supple.  Chest is clear to auscultation with normal expansion.  Cardiovascular exam is regular rate and rhythm.  Abdominal exam nontender or distended. No masses palpated. Extremities show no edema. neuro grossly intact   A/P  1 coronary artery disease status post PCI of RCA-plan to continue aspirin and Plavix (Plavix to continue indefinitely given history of residual coronary disease and vascular disease).  Continue Crestor.  2 hyperlipidemia-given documented vascular disease I would like for patient to be on Crestor 40 mg daily but she is hesitant.  She is only taking 20 mg every other day and is agreed to increase to 20 mg daily.  Check lipids and liver in 12 weeks.  3 hypertension-patient discontinued her lisinopril as she stated her blood pressure was running too low.  It is controlled today and we will continue off of this medication and follow.  4 peripheral vascular disease-continue medical therapy with aspirin and statin.  Followed by vascular surgery.  5 tobacco abuse-patient counseled on discontinuing.  Kirk Ruths, MD

## 2020-04-11 ENCOUNTER — Ambulatory Visit: Payer: Medicare HMO | Admitting: Cardiology

## 2020-04-12 DIAGNOSIS — E785 Hyperlipidemia, unspecified: Secondary | ICD-10-CM | POA: Diagnosis not present

## 2020-04-13 ENCOUNTER — Encounter: Payer: Self-pay | Admitting: Cardiology

## 2020-04-13 ENCOUNTER — Other Ambulatory Visit: Payer: Self-pay

## 2020-04-13 ENCOUNTER — Ambulatory Visit (INDEPENDENT_AMBULATORY_CARE_PROVIDER_SITE_OTHER): Payer: Medicare HMO | Admitting: Cardiology

## 2020-04-13 VITALS — BP 120/62 | HR 62 | Ht 64.0 in | Wt 116.6 lb

## 2020-04-13 DIAGNOSIS — E785 Hyperlipidemia, unspecified: Secondary | ICD-10-CM | POA: Diagnosis not present

## 2020-04-13 DIAGNOSIS — I2510 Atherosclerotic heart disease of native coronary artery without angina pectoris: Secondary | ICD-10-CM

## 2020-04-13 DIAGNOSIS — I739 Peripheral vascular disease, unspecified: Secondary | ICD-10-CM

## 2020-04-13 DIAGNOSIS — I10 Essential (primary) hypertension: Secondary | ICD-10-CM

## 2020-04-13 MED ORDER — ROSUVASTATIN CALCIUM 20 MG PO TABS: 20.0000 mg | ORAL_TABLET | Freq: Every day | ORAL | 3 refills | Status: DC

## 2020-04-13 NOTE — Patient Instructions (Addendum)
Medication Instructions:   TAKE ROSUVASTATIN 20 MG EVERY DAY  *If you need a refill on your cardiac medications before your next appointment, please call your pharmacy*   Lab Work:  Your physician recommends that you return for lab work 12 WEEKS=FASTING  If you have labs (blood work) drawn today and your tests are completely normal, you will receive your results only by: Marland Kitchen MyChart Message (if you have MyChart) OR . A paper copy in the mail If you have any lab test that is abnormal or we need to change your treatment, we will call you to review the results.  Follow-Up: At [facility], you and your health needs are our priority.  As part of our continuing mission to provide you with exceptional heart care, we have created designated Provider Care Teams.  These Care Teams include your primary Cardiologist (physician) and Advanced Practice Providers (APPs -  Physician Assistants and Nurse Practitioners) who all work together to provide you with the care you need, when you need it.  We recommend signing up for the patient portal called "MyChart".  Sign up information is provided on this After Visit Summary.  MyChart is used to connect with patients for Virtual Visits (Telemedicine).  Patients are able to view lab/test results, encounter notes, upcoming appointments, etc.  Non-urgent messages can be sent to your provider as well.   To learn more about what you can do with MyChart, go to [email or web address].    Your next appointment:   6 month(s)  The format for your next appointment:   In Person  Provider:   Kirk Ruths, MD

## 2020-04-19 DIAGNOSIS — J449 Chronic obstructive pulmonary disease, unspecified: Secondary | ICD-10-CM | POA: Diagnosis not present

## 2020-04-19 DIAGNOSIS — Z8719 Personal history of other diseases of the digestive system: Secondary | ICD-10-CM | POA: Diagnosis not present

## 2020-04-19 DIAGNOSIS — I739 Peripheral vascular disease, unspecified: Secondary | ICD-10-CM | POA: Diagnosis not present

## 2020-04-19 DIAGNOSIS — E785 Hyperlipidemia, unspecified: Secondary | ICD-10-CM | POA: Diagnosis not present

## 2020-04-19 DIAGNOSIS — I25118 Atherosclerotic heart disease of native coronary artery with other forms of angina pectoris: Secondary | ICD-10-CM | POA: Diagnosis not present

## 2020-04-19 DIAGNOSIS — I771 Stricture of artery: Secondary | ICD-10-CM | POA: Diagnosis not present

## 2020-04-19 DIAGNOSIS — Z0000 Encounter for general adult medical examination without abnormal findings: Secondary | ICD-10-CM | POA: Diagnosis not present

## 2020-04-19 DIAGNOSIS — Z1339 Encounter for screening examination for other mental health and behavioral disorders: Secondary | ICD-10-CM | POA: Diagnosis not present

## 2020-04-19 DIAGNOSIS — R82998 Other abnormal findings in urine: Secondary | ICD-10-CM | POA: Diagnosis not present

## 2020-04-19 DIAGNOSIS — Z1331 Encounter for screening for depression: Secondary | ICD-10-CM | POA: Diagnosis not present

## 2020-04-19 DIAGNOSIS — E46 Unspecified protein-calorie malnutrition: Secondary | ICD-10-CM | POA: Diagnosis not present

## 2020-04-19 DIAGNOSIS — F17200 Nicotine dependence, unspecified, uncomplicated: Secondary | ICD-10-CM | POA: Diagnosis not present

## 2020-04-25 DIAGNOSIS — Z1212 Encounter for screening for malignant neoplasm of rectum: Secondary | ICD-10-CM | POA: Diagnosis not present

## 2020-04-27 ENCOUNTER — Other Ambulatory Visit: Payer: Self-pay | Admitting: Internal Medicine

## 2020-04-27 DIAGNOSIS — F17200 Nicotine dependence, unspecified, uncomplicated: Secondary | ICD-10-CM

## 2020-04-27 DIAGNOSIS — Z0000 Encounter for general adult medical examination without abnormal findings: Secondary | ICD-10-CM

## 2020-04-28 ENCOUNTER — Other Ambulatory Visit: Payer: Self-pay | Admitting: Internal Medicine

## 2020-04-28 DIAGNOSIS — Z1231 Encounter for screening mammogram for malignant neoplasm of breast: Secondary | ICD-10-CM

## 2020-06-19 ENCOUNTER — Ambulatory Visit: Payer: Medicare HMO

## 2020-06-21 DIAGNOSIS — Z01411 Encounter for gynecological examination (general) (routine) with abnormal findings: Secondary | ICD-10-CM | POA: Diagnosis not present

## 2020-06-21 DIAGNOSIS — Z124 Encounter for screening for malignant neoplasm of cervix: Secondary | ICD-10-CM | POA: Diagnosis not present

## 2020-06-21 DIAGNOSIS — Z6820 Body mass index (BMI) 20.0-20.9, adult: Secondary | ICD-10-CM | POA: Diagnosis not present

## 2020-06-21 DIAGNOSIS — Z01419 Encounter for gynecological examination (general) (routine) without abnormal findings: Secondary | ICD-10-CM | POA: Diagnosis not present

## 2020-06-22 ENCOUNTER — Encounter: Payer: Self-pay | Admitting: Cardiology

## 2020-06-22 DIAGNOSIS — I739 Peripheral vascular disease, unspecified: Secondary | ICD-10-CM | POA: Diagnosis not present

## 2020-06-22 DIAGNOSIS — I25118 Atherosclerotic heart disease of native coronary artery with other forms of angina pectoris: Secondary | ICD-10-CM | POA: Diagnosis not present

## 2020-06-22 DIAGNOSIS — R002 Palpitations: Secondary | ICD-10-CM | POA: Diagnosis not present

## 2020-06-22 DIAGNOSIS — D692 Other nonthrombocytopenic purpura: Secondary | ICD-10-CM | POA: Diagnosis not present

## 2020-06-22 DIAGNOSIS — E785 Hyperlipidemia, unspecified: Secondary | ICD-10-CM | POA: Diagnosis not present

## 2020-06-22 DIAGNOSIS — F17200 Nicotine dependence, unspecified, uncomplicated: Secondary | ICD-10-CM | POA: Diagnosis not present

## 2020-06-22 DIAGNOSIS — G5762 Lesion of plantar nerve, left lower limb: Secondary | ICD-10-CM | POA: Diagnosis not present

## 2020-06-22 DIAGNOSIS — M545 Low back pain, unspecified: Secondary | ICD-10-CM | POA: Diagnosis not present

## 2020-06-23 ENCOUNTER — Telehealth: Payer: Self-pay

## 2020-06-23 ENCOUNTER — Other Ambulatory Visit: Payer: Self-pay

## 2020-06-23 DIAGNOSIS — I779 Disorder of arteries and arterioles, unspecified: Secondary | ICD-10-CM

## 2020-06-23 DIAGNOSIS — I6523 Occlusion and stenosis of bilateral carotid arteries: Secondary | ICD-10-CM

## 2020-06-23 NOTE — Telephone Encounter (Signed)
Patient has extensive vascular surgical history including left subclavian stents, bilateral carotid endarterectomies and an aortobifem. She calls today to report 2-3 weeks of dizziness and bilateral blurry vision. Her right arm occasionally goes numb and then goes back to normal. She is able to move it and use it. Discussed with PA, placed on schedule for carotid duplex and follow up with Dr. Trula Slade.

## 2020-06-27 ENCOUNTER — Other Ambulatory Visit: Payer: Self-pay

## 2020-06-27 ENCOUNTER — Ambulatory Visit (HOSPITAL_COMMUNITY)
Admission: RE | Admit: 2020-06-27 | Discharge: 2020-06-27 | Disposition: A | Discharge status: Home / Self Care | Payer: Medicare HMO | Source: Ambulatory Visit | Attending: Surgery | Admitting: Surgery

## 2020-06-27 DIAGNOSIS — I6523 Occlusion and stenosis of bilateral carotid arteries: Secondary | ICD-10-CM | POA: Diagnosis not present

## 2020-06-28 ENCOUNTER — Ambulatory Visit (INDEPENDENT_AMBULATORY_CARE_PROVIDER_SITE_OTHER): Payer: Medicare HMO

## 2020-06-28 ENCOUNTER — Ambulatory Visit (INDEPENDENT_AMBULATORY_CARE_PROVIDER_SITE_OTHER): Payer: Medicare HMO | Admitting: Podiatry

## 2020-06-28 ENCOUNTER — Other Ambulatory Visit: Payer: Self-pay | Admitting: Podiatry

## 2020-06-28 DIAGNOSIS — M7742 Metatarsalgia, left foot: Secondary | ICD-10-CM

## 2020-06-28 DIAGNOSIS — D3610 Benign neoplasm of peripheral nerves and autonomic nervous system, unspecified: Secondary | ICD-10-CM

## 2020-06-28 MED ORDER — METHYLPREDNISOLONE 4 MG PO TBPK: ORAL_TABLET | ORAL | 0 refills | Status: DC

## 2020-06-29 ENCOUNTER — Ambulatory Visit
Admission: RE | Admit: 2020-06-29 | Discharge: 2020-06-29 | Disposition: A | Discharge status: Home / Self Care | Payer: Medicare HMO | Source: Ambulatory Visit | Attending: Internal Medicine | Admitting: Internal Medicine

## 2020-06-29 ENCOUNTER — Other Ambulatory Visit: Payer: Self-pay

## 2020-06-29 DIAGNOSIS — Z13820 Encounter for screening for osteoporosis: Secondary | ICD-10-CM | POA: Diagnosis not present

## 2020-06-29 DIAGNOSIS — Z1231 Encounter for screening mammogram for malignant neoplasm of breast: Secondary | ICD-10-CM

## 2020-07-03 ENCOUNTER — Encounter: Payer: Self-pay | Admitting: Surgery

## 2020-07-03 ENCOUNTER — Ambulatory Visit (INDEPENDENT_AMBULATORY_CARE_PROVIDER_SITE_OTHER): Payer: Medicare HMO | Admitting: Surgery

## 2020-07-03 ENCOUNTER — Other Ambulatory Visit: Payer: Self-pay

## 2020-07-03 VITALS — BP 125/76 | HR 57 | Temp 98.2°F | Resp 20 | Ht 64.0 in | Wt 118.0 lb

## 2020-07-03 DIAGNOSIS — I6523 Occlusion and stenosis of bilateral carotid arteries: Secondary | ICD-10-CM | POA: Diagnosis not present

## 2020-07-03 DIAGNOSIS — I70213 Atherosclerosis of native arteries of extremities with intermittent claudication, bilateral legs: Secondary | ICD-10-CM | POA: Diagnosis not present

## 2020-07-03 DIAGNOSIS — I771 Stricture of artery: Secondary | ICD-10-CM | POA: Diagnosis not present

## 2020-07-03 NOTE — Progress Notes (Signed)
[facility] [place]  Patient name: ROSEALYNN MATEUS MRN: [id number] DOB: 06/09/1950 Sex: female   REASON FOR VISIT:    Follow up  [facility] ILLNESS:    ELISABET GUTZMER is a 70 y.o. female who returns today for follow-up.  On 09/16/2017 she underwent stenting of her left subclavian artery and angioplasty of her left brachial artery.  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  She remained asymptomatic and was discharged home.   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  She went back for lysis of adhesions secondary to abdominal pain in April.  In June 2013 she underwent left carotid endarterectomy for a high-grade asymptomatic stenosis.  She then later presented with dizziness and temporary vision loss.  On 06/24/2012 she had stenting of her left subclavian artery.  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  On 08/13/2013 she underwent right carotid endarterectomy for an asymptomatic right carotid stenosis   She went for angiography because of a left subclavian in-stent stenosis on 04/27/2015.  She was found to have a high-grade stenosis and this was stented.  On 04/30/2015 she presented with acute ischemia to the left hand.  She was taken to the operating room by Dr. fields who performed a thrombectomy. On 12/05/2015 she underwent drug coated balloon angioplasty of a left subclavian in-stent stenosis.  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  She is complaining of a blurry type vision in both eyes that will come on once a day.   The patient is a smoker.  She is medically managed for hypertension which is been under good control.  She is not on cholesterol medicine for hypercholesterolemia.  She underwent  cardiac catheterization and PCI in November 2021.   PAST MEDICAL HISTORY:   Past Medical History:  Diagnosis Date   Anemia    Angina    March 2013   Anxiety    Asthma    Blood transfusion    CAD (coronary artery disease)    11/22/19- PCI ostial RCA, mRCA   Depression    Fibromyalgia    History of lead poisoning 1977   Hyperlipidemia    Hypertension    Migraine    Peripheral vascular disease ([place])    claudication  right greater than left  femoral artery   Small bowel obstruction, partial ([place]) 04/08/11   Stroke ([place])    Subclavian artery disease (HCC)    left   Uterine cancer ([place])    Pre cervical     FAMILY HISTORY:   Family History  Problem Relation Age of Onset   Cancer Mother        breast, colon, ovarian   Cancer Brother 35       lung   Hyperlipidemia Brother    Cancer Other    Stroke Other    Cancer Maternal Grandmother        ovarian   Anesthesia problems Neg Hx     SOCIAL HISTORY:   Social History   Tobacco Use   Smoking status: Some Days    Packs/day: 0.10    Years: 45.00    Pack years: 4.50    Types: Cigarettes   Smokeless tobacco: Never   Tobacco comments:    pt states that she smokes about 6 cigs per  day  Substance Use Topics   Alcohol use: No    Alcohol/week: 0.0 standard drinks     ALLERGIES:   Allergies  Allergen Reactions   Codeine Nausea And Vomiting    Dizziness, headache, has passed out   Hydrocodone Nausea And Vomiting and Other (See Comments)    Headache and dizziness   Other Shortness Of Breath    Allergy to cologne/perfume base   Oxycodone Nausea And Vomiting and Other (See Comments)    Dizziness    Atorvastatin Diarrhea and Nausea And Vomiting    Causes GI symptoms if she takes it every day.     CURRENT MEDICATIONS:   Current Outpatient Medications  Medication Sig Dispense Refill   acetaminophen (TYLENOL) 650 MG CR tablet Take 1,300 mg by mouth every 8 (eight) hours as needed for pain.     albuterol  (PROVENTIL HFA;VENTOLIN HFA) 108 (90 Base) MCG/ACT inhaler Inhale 1-2 puffs into the lungs every 6 (six) hours as needed for wheezing or shortness of breath. 1 Inhaler 0   Aromatic Inhalants (VICKS VAPOINHALER IN) Inhale 1 puff into the lungs daily as needed (congestion).     Ascorbic Acid (VITAMIN C) 1000 MG tablet Take 1,000 mg by mouth daily.     ASPERCREME LIDOCAINE EX Apply 1 application topically daily as needed (back pain).     aspirin EC 81 MG EC tablet Take 1 tablet (81 mg total) by mouth daily. Swallow whole. 30 tablet 11   Cholecalciferol (VITAMIN D3) 50 MCG (2000 UT) TABS Take 2,000 Units by mouth daily.      clopidogrel (PLAVIX) 75 MG tablet Take 1 tablet (75 mg total) by mouth at bedtime. 90 tablet 1   Cyanocobalamin (VITAMIN B-12) 5000 MCG TBDP Take 5,000 mcg by mouth daily.      diphenhydrAMINE-zinc acetate (BENADRYL) cream Apply 1 application topically daily as needed for itching.     ferrous sulfate 325 (65 FE) MG tablet Take 325 mg by mouth every Monday, Wednesday, and Friday.      lisinopril (ZESTRIL) 5 MG tablet TAKE ONE TABLET BY MOUTH DAILY 90 tablet 0   loratadine (CLARITIN) 10 MG tablet Take 10 mg by mouth daily as needed for allergies.      methylPREDNISolone (MEDROL DOSEPAK) 4 MG TBPK tablet Take as directed 21 each 0   nitroGLYCERIN (NITROSTAT) 0.4 MG SL tablet Place 1 tablet (0.4 mg total) under the tongue every 5 (five) minutes as needed. 25 tablet 2   OVER THE COUNTER MEDICATION Take 1 tablet by mouth every other day. MK-7  (vit K + magnesium)     rosuvastatin (CRESTOR) 20 MG tablet Take 1 tablet (20 mg total) by mouth daily. 90 tablet 3   No current facility-administered medications for this visit.    REVIEW OF SYSTEMS:   [X]  denotes positive finding, [ ]  denotes negative finding Cardiac  Comments:  Chest pain or chest pressure:    Shortness of breath upon exertion:    Short of breath when lying flat:    Irregular heart rhythm:        Vascular    Pain in  calf, thigh, or hip brought on by ambulation:    Pain in feet at night that wakes you up from your sleep:     Blood clot in your veins:    Leg swelling:         Pulmonary    Oxygen at home:    Productive cough:     Wheezing:  Neurologic    Sudden weakness in arms or legs:     Sudden numbness in arms or legs:     Sudden onset of difficulty speaking or slurred speech:    Temporary loss of vision in one eye:     Problems with dizziness:         Gastrointestinal    Blood in stool:     Vomited blood:         Genitourinary    Burning when urinating:     Blood in urine:        Psychiatric    Major depression:         Hematologic    Bleeding problems:    Problems with blood clotting too easily:        Skin    Rashes or ulcers:        Constitutional    Fever or chills:      PHYSICAL EXAM:   There were no vitals filed for this visit.  GENERAL: The patient is a well-nourished female, in no acute distress. The vital signs are documented above. CARDIAC: There is a regular rate and rhythm.  VASCULAR: Palpable radial pulses bilaterally PULMONARY: Non-labored respirations MUSCULOSKELETAL: There are no major deformities or cyanosis. NEUROLOGIC: No focal weakness or paresthesias are detected. SKIN: There are no ulcers or rashes noted. PSYCHIATRIC: The patient has a normal affect.  STUDIES:   I have reviewed the following duplex studies: Carotid duplex: Right Carotid: Velocities in the right ICA are consistent with a 1-39%  stenosis.                 Non-hemodynamically significant plaque <50% noted in the  CCA. The                 ECA appears >50% stenosed.   Left Carotid: Velocities in the left ICA are consistent with a 1-39%  stenosis.                Non-hemodynamically significant plaque <50% noted in the  CCA. The                ECA appears >50% stenosed.   Vertebrals:  Bilateral vertebral arteries demonstrate antegrade flow.  Subclavians: Normal flow  hemodynamics were seen in bilateral subclavian               arteries.   MEDICAL ISSUES:   Carotid: Bilateral carotid arteries are widely patent.  She appears to be asymptomatic.  Her vision issues do not sound like amaurosis.  I have asked that she go see an ophthalmologist.  Subclavian artery: Duplex suggest no significant stenosis.  She has palpable radial pulses bilaterally.  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  Lower extremity: ABIs were essentially unchanged 6 months ago.  She is not having any symptoms of claudication.  She does not have any open wounds.  I will repeat her ABIs when she returns.   I have her scheduled for follow-up in 6 months with ABIs and carotid duplex.    Leia Alf, MD, FACS [facility] [phone] Pager [phone]

## 2020-07-04 ENCOUNTER — Encounter: Payer: Self-pay | Admitting: Podiatry

## 2020-07-04 NOTE — Progress Notes (Signed)
Subjective:  Patient ID: Kimberly [name], female    DOB: 09/09/1950,  MRN: [id number]  No chief complaint on file.   70 y.o. female presents with the above complaint.  Patient presents with excruciating left foot pain that has been going on for 1 to 2 months.  Patient states been going under the toes feels like there is a hard knot.  It swells up by the end of that there is burning sensation continuous hurting.  She has not seen anyone else prior to seeing me.  Pain scale is 10 out of 10.  She has not any immobilization.  She would like to discuss treatment options for this.   Review of Systems: Negative except as noted in the HPI. Denies N/V/F/Ch.  Past Medical History:  Diagnosis Date   Anemia    Angina    March 2013   Anxiety    Asthma    Blood transfusion    CAD (coronary artery disease)    11/22/19- PCI ostial RCA, mRCA   Depression    Fibromyalgia    History of lead poisoning 1977   Hyperlipidemia    Hypertension    Migraine    Peripheral vascular disease ([place])    claudication  right greater than left  femoral artery   Small bowel obstruction, partial ([place]) 04/08/11   Stroke ([place])    Subclavian artery disease (HCC)    left   Uterine cancer (HCC)    Pre cervical    Current Outpatient Medications:    methylPREDNISolone (MEDROL DOSEPAK) 4 MG TBPK tablet, Take as directed, Disp: 21 each, Rfl: 0   acetaminophen (TYLENOL) 650 MG CR tablet, Take 1,300 mg by mouth every 8 (eight) hours as needed for pain., Disp: , Rfl:    albuterol (PROVENTIL HFA;VENTOLIN HFA) 108 (90 Base) MCG/ACT inhaler, Inhale 1-2 puffs into the lungs every 6 (six) hours as needed for wheezing or shortness of breath., Disp: 1 Inhaler, Rfl: 0   Aromatic Inhalants (VICKS VAPOINHALER IN), Inhale 1 puff into the lungs daily as needed (congestion)., Disp: , Rfl:    Ascorbic Acid (VITAMIN C) 1000 MG tablet, Take 1,000 mg by mouth daily., Disp: , Rfl:    ASPERCREME LIDOCAINE EX, Apply 1 application topically  daily as needed (back pain)., Disp: , Rfl:    aspirin EC 81 MG EC tablet, Take 1 tablet (81 mg total) by mouth daily. Swallow whole., Disp: 30 tablet, Rfl: 11   Cholecalciferol (VITAMIN D3) 50 MCG (2000 UT) TABS, Take 2,000 Units by mouth daily. , Disp: , Rfl:    clopidogrel (PLAVIX) 75 MG tablet, Take 1 tablet (75 mg total) by mouth at bedtime., Disp: 90 tablet, Rfl: 1   Cyanocobalamin (VITAMIN B-12) 5000 MCG TBDP, Take 5,000 mcg by mouth daily. , Disp: , Rfl:    diphenhydrAMINE-zinc acetate (BENADRYL) cream, Apply 1 application topically daily as needed for itching., Disp: , Rfl:    ferrous sulfate 325 (65 FE) MG tablet, Take 325 mg by mouth every Monday, Wednesday, and Friday. , Disp: , Rfl:    loratadine (CLARITIN) 10 MG tablet, Take 10 mg by mouth daily as needed for allergies. , Disp: , Rfl:    nitroGLYCERIN (NITROSTAT) 0.4 MG SL tablet, Place 1 tablet (0.4 mg total) under the tongue every 5 (five) minutes as needed., Disp: 25 tablet, Rfl: 2   OVER THE COUNTER MEDICATION, Take 1 tablet by mouth every other day. MK-7  (vit K + magnesium), Disp: , Rfl:  rosuvastatin (CRESTOR) 20 MG tablet, Take 1 tablet (20 mg total) by mouth daily., Disp: 90 tablet, Rfl: 3  Social History   Tobacco Use  Smoking Status Some Days   Packs/day: 0.10   Years: 45.00   Pack years: 4.50   Types: Cigarettes  Smokeless Tobacco Never  Tobacco Comments   pt states that she smokes about 6 cigs per day    Allergies  Allergen Reactions   Codeine Nausea And Vomiting    Dizziness, headache, has passed out   Hydrocodone Nausea And Vomiting and Other (See Comments)    Headache and dizziness   Other Shortness Of Breath    Allergy to cologne/perfume base   Oxycodone Nausea And Vomiting and Other (See Comments)    Dizziness    Atorvastatin Diarrhea and Nausea And Vomiting    Causes GI symptoms if she takes it every day.   Objective:  There were no vitals filed for this visit. There is no height or weight on  file to calculate BMI. Constitutional Well developed. Well nourished.  Vascular Dorsalis pedis pulses palpable bilaterally. Posterior tibial pulses palpable bilaterally. Capillary refill normal to all digits.  No cyanosis or clubbing noted. Pedal hair growth normal.  Neurologic Normal speech. Oriented to person, place, and time. Epicritic sensation to light touch grossly present bilaterally.  Dermatologic Nails well groomed and normal in appearance. No open wounds. No skin lesions.  Orthopedic: Generalized pain noted in the forefoot underneath all the metatarsal heads.  Pain also was noted in the interspaces of 2 through 4.  No pain with range of motion of metatarsophalangeal joint 1 through 5.   Radiographs: 3 views of skeletally mature the left foot: Bipartite sesamoid noted.  Decrease in intermetatarsal space noted between second and third interspaces.  Mild hammertoe contractures noted 2 through 5.  No other bony abnormalities noted no stress fractures noted. Assessment:   1. Metatarsalgia, left foot    Plan:  Patient was evaluated and treated and all questions answered.  Left metatarsalgia neuroma versus capsulitis -I explained to the patient the etiology of metatarsalgia and various treatment options were discussed.  The amount of pain that she is having I believe.  Patient will benefit from cam boot immobilization to allow the pain to be focalized.  At this time given that she has a lot of generalized pain I am unable to ascertain the information that I required.  For now I am hoping the cam boot immobilization will allow me to localize the pain that she is experiencing.  She states understanding will wear the boot. -Cam boot was dispensed -Medrol Dosepak was given for decreasing acute inflammatory pain.  No follow-ups on file.

## 2020-07-05 ENCOUNTER — Other Ambulatory Visit: Payer: Self-pay

## 2020-07-05 DIAGNOSIS — I70213 Atherosclerosis of native arteries of extremities with intermittent claudication, bilateral legs: Secondary | ICD-10-CM

## 2020-07-05 DIAGNOSIS — I6523 Occlusion and stenosis of bilateral carotid arteries: Secondary | ICD-10-CM

## 2020-07-06 DIAGNOSIS — G5762 Lesion of plantar nerve, left lower limb: Secondary | ICD-10-CM | POA: Diagnosis not present

## 2020-07-06 DIAGNOSIS — R002 Palpitations: Secondary | ICD-10-CM | POA: Diagnosis not present

## 2020-07-06 DIAGNOSIS — F17200 Nicotine dependence, unspecified, uncomplicated: Secondary | ICD-10-CM | POA: Diagnosis not present

## 2020-07-06 DIAGNOSIS — I739 Peripheral vascular disease, unspecified: Secondary | ICD-10-CM | POA: Diagnosis not present

## 2020-07-06 DIAGNOSIS — E785 Hyperlipidemia, unspecified: Secondary | ICD-10-CM | POA: Diagnosis not present

## 2020-07-06 DIAGNOSIS — M545 Low back pain, unspecified: Secondary | ICD-10-CM | POA: Diagnosis not present

## 2020-07-06 DIAGNOSIS — D692 Other nonthrombocytopenic purpura: Secondary | ICD-10-CM | POA: Diagnosis not present

## 2020-07-06 DIAGNOSIS — I25118 Atherosclerotic heart disease of native coronary artery with other forms of angina pectoris: Secondary | ICD-10-CM | POA: Diagnosis not present

## 2020-07-12 ENCOUNTER — Telehealth: Payer: Self-pay | Admitting: Cardiology

## 2020-07-12 DIAGNOSIS — E785 Hyperlipidemia, unspecified: Secondary | ICD-10-CM

## 2020-07-12 NOTE — Telephone Encounter (Signed)
Patient stated she was returning phone call

## 2020-07-12 NOTE — Telephone Encounter (Signed)
Left message to call back  

## 2020-07-13 MED ORDER — EZETIMIBE 10 MG PO TABS: 10.0000 mg | ORAL_TABLET | Freq: Every day | ORAL | 3 refills | Status: DC

## 2020-07-13 MED ORDER — ROSUVASTATIN CALCIUM 40 MG PO TABS: 40.0000 mg | ORAL_TABLET | Freq: Every day | ORAL | Status: DC

## 2020-07-13 NOTE — Telephone Encounter (Signed)
Left message for patient of the pharm md recommendations.

## 2020-07-13 NOTE — Telephone Encounter (Signed)
Spoke with pt, she is taking rosuvastatin 20 mg daily and will now increase to 40 mg. She will also start the zetia 10 mg once daily. See result on recent labs. New script sent to the pharmacy, Lab orders mailed to the pt and Follow up scheduled per recall. She is having trouble with foot and hip pain and was given methylprednisolone and before taking it she wants to make sure it is okay. Will forward to the pharm md to review.

## 2020-07-13 NOTE — Telephone Encounter (Signed)
Left message for pt to call to discuss medications related to recent lab work.

## 2020-07-13 NOTE — Telephone Encounter (Signed)
She will be fine to use the methylprednisolone

## 2020-07-26 ENCOUNTER — Ambulatory Visit: Payer: Medicare HMO | Admitting: Podiatry

## 2020-08-04 ENCOUNTER — Encounter: Payer: Self-pay | Admitting: *Deleted

## 2020-08-16 ENCOUNTER — Ambulatory Visit: Payer: Medicare HMO | Admitting: Podiatry

## 2020-08-29 ENCOUNTER — Other Ambulatory Visit: Payer: Self-pay

## 2020-08-29 DIAGNOSIS — E785 Hyperlipidemia, unspecified: Secondary | ICD-10-CM

## 2020-08-29 MED ORDER — ROSUVASTATIN CALCIUM 40 MG PO TABS: 40.0000 mg | ORAL_TABLET | Freq: Every day | ORAL | 6 refills | Status: DC

## 2020-09-19 ENCOUNTER — Other Ambulatory Visit: Payer: Self-pay

## 2020-09-19 DIAGNOSIS — E785 Hyperlipidemia, unspecified: Secondary | ICD-10-CM

## 2020-09-19 MED ORDER — ROSUVASTATIN CALCIUM 40 MG PO TABS: 40.0000 mg | ORAL_TABLET | Freq: Every day | ORAL | 6 refills | Status: DC

## 2020-10-18 ENCOUNTER — Other Ambulatory Visit: Payer: Self-pay

## 2020-10-18 MED ORDER — CLOPIDOGREL BISULFATE 75 MG PO TABS: 75.0000 mg | ORAL_TABLET | Freq: Every day | ORAL | 1 refills | Status: DC

## 2020-10-31 ENCOUNTER — Encounter: Payer: Self-pay | Admitting: *Deleted

## 2020-11-14 ENCOUNTER — Telehealth: Payer: Self-pay | Admitting: Licensed Clinical Social Worker

## 2020-11-14 NOTE — Telephone Encounter (Signed)
CSW  received referral to assist patient with supportive service. CSW reached out to patient and left message for return call. Raquel Sarna, [place], [place]

## 2020-11-15 ENCOUNTER — Other Ambulatory Visit: Payer: Self-pay

## 2020-11-15 DIAGNOSIS — Z789 Other specified health status: Secondary | ICD-10-CM

## 2020-11-15 NOTE — Telephone Encounter (Signed)
CSW received return call from patient who shared that she will need to be out of her home by the end of December. Patient states she received a letter stating that the owners would like to sell the home and she will need to vacate. Patient reports she has custody of her minor grandson and she only receives $1,200.00 monthly income from [employer]. She also states she receives food stamps but her monthly allotment was reduced this year. Patient reports she is overwhelmed with household finances but has been making ends meet and now unsure of where she will find housing that is affordable. CSW discussed some options and provided numbers for [place]. CSW discussed the social serve web site and encouraged patient to explore availability. CSW will make copies of social serve options and mail to patient. CSW also discussed some financial assistance through the Patient Care Fund to assist with deposits and first month rent. Patient verbalizes understanding and will follow up and return call to CSW as needed. CSW continues to be available to assist with housing. Raquel Sarna, [place], [place]

## 2020-11-17 ENCOUNTER — Telehealth: Payer: Self-pay | Admitting: *Deleted

## 2020-11-17 NOTE — Telephone Encounter (Signed)
   Telephone encounter was:  Unsuccessful.  11/17/2020 Name: MERLIN EGE MRN: [id number] DOB: 1950-04-07  Unsuccessful outbound call made today to assist with:   housing  Outreach Attempt:  1st Attempt  A HIPAA compliant voice message was left requesting a return call.  Instructed patient to call back at   Instructed patient to call back at [phone]  at their earliest convenience.   [place], Care Management  [phone] [address] , [place] [address] Email : [email or web address]

## 2020-11-21 ENCOUNTER — Telehealth: Payer: Self-pay | Admitting: *Deleted

## 2020-11-21 NOTE — Progress Notes (Deleted)
HPI: FU CAD.  Nuclear study January 2013 normal with ejection fraction 79%.  Patient does have a history of peripheral vascular disease and has had previous left subclavian artery stent and left brachial artery angioplasty, right brachial artery embolectomy, previous left carotid endarterectomy and aortobifemoral bypass graft.  Most recent carotid Dopplers August 2021 showed 40 to 59% bilateral stenosis.  Dopplers November 2021 showed patent aortobifemoral bypass graft.  ABIs mild on the right and moderate on the left.  Cardiac catheterization November 2021 showed 99% ostial right coronary artery, 70% proximal right coronary artery, 50% circumflex, 60% mid LAD and 50% second diagonal.  Ejection fraction 55 to 65%.  Patient subsequently had PCI of the right coronary artery with drug-eluting stents x2.  Echocardiogram November 2021 showed normal LV function, grade 1 diastolic dysfunction.  Carotid Dopplers June 2022 showed 1 to 39% right and left stenosis.  Since last seen   Current Outpatient Medications  Medication Sig Dispense Refill   acetaminophen (TYLENOL) 650 MG CR tablet Take 1,300 mg by mouth every 8 (eight) hours as needed for pain.     albuterol (PROVENTIL HFA;VENTOLIN HFA) 108 (90 Base) MCG/ACT inhaler Inhale 1-2 puffs into the lungs every 6 (six) hours as needed for wheezing or shortness of breath. 1 Inhaler 0   Aromatic Inhalants (VICKS VAPOINHALER IN) Inhale 1 puff into the lungs daily as needed (congestion).     Ascorbic Acid (VITAMIN C) 1000 MG tablet Take 1,000 mg by mouth daily.     ASPERCREME LIDOCAINE EX Apply 1 application topically daily as needed (back pain).     aspirin EC 81 MG EC tablet Take 1 tablet (81 mg total) by mouth daily. Swallow whole. 30 tablet 11   Cholecalciferol (VITAMIN D3) 50 MCG (2000 UT) TABS Take 2,000 Units by mouth daily.      clopidogrel (PLAVIX) 75 MG tablet Take 1 tablet (75 mg total) by mouth at bedtime. 90 tablet 1   Cyanocobalamin (VITAMIN  B-12) 5000 MCG TBDP Take 5,000 mcg by mouth daily.      diphenhydrAMINE-zinc acetate (BENADRYL) cream Apply 1 application topically daily as needed for itching.     ezetimibe (ZETIA) 10 MG tablet Take 1 tablet (10 mg total) by mouth daily. 90 tablet 3   ferrous sulfate 325 (65 FE) MG tablet Take 325 mg by mouth every Monday, Wednesday, and Friday.      loratadine (CLARITIN) 10 MG tablet Take 10 mg by mouth daily as needed for allergies.      methylPREDNISolone (MEDROL DOSEPAK) 4 MG TBPK tablet Take as directed 21 each 0   nitroGLYCERIN (NITROSTAT) 0.4 MG SL tablet Place 1 tablet (0.4 mg total) under the tongue every 5 (five) minutes as needed. 25 tablet 2   OVER THE COUNTER MEDICATION Take 1 tablet by mouth every other day. MK-7  (vit K + magnesium)     rosuvastatin (CRESTOR) 40 MG tablet Take 1 tablet (40 mg total) by mouth daily. 30 tablet 6   No current facility-administered medications for this visit.     Past Medical History:  Diagnosis Date   Anemia    Angina    March 2013   Anxiety    Asthma    Blood transfusion    CAD (coronary artery disease)    11/22/19- PCI ostial RCA, mRCA   Depression    Fibromyalgia    History of lead poisoning 1977   Hyperlipidemia    Hypertension    Migraine  Peripheral vascular disease (HCC)    claudication  right greater than left  femoral artery   Small bowel obstruction, partial ([employer]) 04/08/11   Stroke ([place])    Subclavian artery disease ([place])    left   Uterine cancer ([place])    Pre cervical    Past Surgical History:  Procedure Laterality Date   abdominal angiogram     ABDOMINAL ANGIOGRAM N/A 02/05/2011   Procedure: ABDOMINAL ANGIOGRAM;  Surgeon: Serafina Mitchell, MD;  Location: [facility] CATH LAB;  Service: Cardiovascular;  Laterality: N/A;   ABDOMINAL HYSTERECTOMY  2008   AORTA - BILATERAL FEMORAL ARTERY BYPASS GRAFT  02/26/2011   Procedure: AORTA BIFEMORAL BYPASS GRAFT;  Surgeon: Theotis Burrow, MD;  Location: [facility] OR;  Service: Vascular;   Laterality: N/A;   AORTIC ARCH ANGIOGRAPHY N/A 09/16/2017   Procedure: AORTIC ARCH ANGIOGRAPHY;  Surgeon: Serafina Mitchell, MD;  Location: [place] CV LAB;  Service: Cardiovascular;  Laterality: N/A;   AORTIC ARCH ANGIOGRAPHY N/A 01/19/2019   Procedure: AORTIC ARCH ANGIOGRAPHY;  Surgeon: Serafina Mitchell, MD;  Location: [place] CV LAB;  Service: Cardiovascular;  Laterality: N/A;  Left upper extremity angiography   ARCH AORTOGRAM N/A 06/24/2012   Procedure: ARCH AORTOGRAM;  Surgeon: Serafina Mitchell, MD;  Location: [facility] CATH LAB;  Service: Cardiovascular;  Laterality: N/A;   BILATERAL UPPER EXTREMITY ANGIOGRAM Left 08/12/2013   Procedure: UPPER EXTREMITY ANGIOGRAM;  Surgeon: Serafina Mitchell, MD;  Location: [facility] CATH LAB;  Service: Cardiovascular;  Laterality: Left;   CAROTID ENDARTERECTOMY Left 06/13/2011   Left  CEA   CAROTID ENDARTERECTOMY Right 08-13-13   CEA   CHOLECYSTECTOMY N/A 12/08/2012   Procedure: LAPAROSCOPIC CHOLECYSTECTOMY WITH INTRAOPERATIVE CHOLANGIOGRAM;  Surgeon: Haywood Lasso, MD;  Location: [place];  Service: General;  Laterality: N/A;   COLONOSCOPY WITH PROPOFOL N/A 06/16/2014   Procedure: COLONOSCOPY WITH PROPOFOL;  Surgeon: Garlan Fair, MD;  Location: [facility] ENDOSCOPY;  Service: Endoscopy;  Laterality: N/A;   CORONARY STENT INTERVENTION N/A 11/22/2019   Procedure: CORONARY STENT INTERVENTION;  Surgeon: Nelva Bush, MD;  Location: [place] CV LAB;  Service: Cardiovascular;  Laterality: N/A;   EMBOLECTOMY Left 04/30/2015   Procedure: EMBOLECTOMY BRACHIAL, Radial and ulnar arteries with patch angioplasty of brachial artery.;  Surgeon: Elam Dutch, MD;  Location: [place];  Service: Vascular;  Laterality: Left;   ENDARTERECTOMY  06/13/2011   Procedure: ENDARTERECTOMY CAROTID;  Surgeon: Serafina Mitchell, MD;  Location: [facility] OR;  Service: Vascular;  Laterality: Left;  Left carotid artery endarterectomy with vascu-guard patch angioplasty   ENDARTERECTOMY Right 08/13/2013    Procedure: RIGHT  CAROTID ENDARTERECTOMY CAROTID WITH BOVINE PERICARDIAL PATCH ANGIOPLASTY;  Surgeon: Serafina Mitchell, MD;  Location: [place];  Service: Vascular;  Laterality: Right;   ESOPHAGOGASTRODUODENOSCOPY (EGD) WITH PROPOFOL N/A 06/16/2014   Procedure: ESOPHAGOGASTRODUODENOSCOPY (EGD) WITH PROPOFOL;  Surgeon: Garlan Fair, MD;  Location: [facility] ENDOS[facility];  Service: Endoscopy;  Laterality: N/A;   LAPAROTOMY  04/10/2011   Procedure: EXPLORATORY LAPAROTOMY;  Surgeon: Shann Medal, MD;  Location: [place];  Service: General;  Laterality: N/A;  ENTEROLYSIS OF ADHESIONS   LEFT HEART CATH AND CORONARY ANGIOGRAPHY N/A 11/19/2019   Procedure: LEFT HEART CATH AND CORONARY ANGIOGRAPHY;  Surgeon: Wellington Hampshire, MD;  Location: [place] CV LAB;  Service: Cardiovascular;  Laterality: N/A;   LOWER EXTREMITY ANGIOGRAM Left 06/24/12   Left stent intervention, left subclavian   PERIPHERAL VASCULAR BALLOON ANGIOPLASTY Left 09/16/2017   Procedure: PERIPHERAL VASCULAR BALLOON  ANGIOPLASTY;  Surgeon: Serafina Mitchell, MD;  Location: [place] CV LAB;  Service: Cardiovascular;  Laterality: Left;  brachial artery   PERIPHERAL VASCULAR BALLOON ANGIOPLASTY Left 01/19/2019   Procedure: PERIPHERAL VASCULAR BALLOON ANGIOPLASTY;  Surgeon: Serafina Mitchell, MD;  Location: [place] CV LAB;  Service: Cardiovascular;  Laterality: Left;  brachial artery   PERIPHERAL VASCULAR CATHETERIZATION N/A 04/26/2015   Procedure: Aortic Arch Angiography;  Surgeon: Serafina Mitchell, MD;  Location: [place] CV LAB;  Service: Cardiovascular;  Laterality: N/A;   PERIPHERAL VASCULAR CATHETERIZATION  04/26/2015   Procedure: Peripheral Vascular Intervention;  Surgeon: Serafina Mitchell, MD;  Location: [place] CV LAB;  Service: Cardiovascular;;  lt illiac   PERIPHERAL VASCULAR CATHETERIZATION N/A 12/05/2015   Procedure: Aortic Arch Angiography;  Surgeon: Serafina Mitchell, MD;  Location: [employer] CV LAB;  Service: Cardiovascular;  Laterality:  N/A;   PERIPHERAL VASCULAR CATHETERIZATION Left 12/05/2015   Procedure: Peripheral Vascular Intervention;  Surgeon: Serafina Mitchell, MD;  Location: [place] CV LAB;  Service: Cardiovascular;  Laterality: Left;  subclavian   PERIPHERAL VASCULAR INTERVENTION Left 09/16/2017   Procedure: PERIPHERAL VASCULAR INTERVENTION;  Surgeon: Serafina Mitchell, MD;  Location: [place] CV LAB;  Service: Cardiovascular;  Laterality: Left;  subclavian artery   PERIPHERAL VASCULAR INTERVENTION Left 01/19/2019   Procedure: PERIPHERAL VASCULAR INTERVENTION;  Surgeon: Serafina Mitchell, MD;  Location: [place] CV LAB;  Service: Cardiovascular;  Laterality: Left;  subclavian stent   PR VEIN BYPASS GRAFT,AORTO-FEM-POP  02/26/11   UPPER EXTREMITY ANGIOGRAPHY Left 09/16/2017   Procedure: Upper Extremity Angiography;  Surgeon: Serafina Mitchell, MD;  Location: [place] CV LAB;  Service: Cardiovascular;  Laterality: Left;    Social History   Socioeconomic History   Marital status: Unknown    Spouse name: Not on file   Number of children: 2   Years of education: Not on file   Highest education level: Not on file  Occupational History   Not on file  Tobacco Use   Smoking status: Some Days    Packs/day: 0.10    Years: 45.00    Pack years: 4.50    Types: Cigarettes   Smokeless tobacco: Never   Tobacco comments:    pt states that she smokes about 6 cigs per day  Vaping Use   Vaping Use: Never used  Substance and Sexual Activity   Alcohol use: No    Alcohol/week: 0.0 standard drinks   Drug use: No    Comment: History of Cocaine and marijuana abuse: "not in many years" (04/08/11   Sexual activity: Not Currently  Other Topics Concern   Not on file  Social History Narrative   Pt lives at home with her 2 grandsons. She has been separated for 80yrs, has two children, and has a 12th grade education level. She drinks 3 cups of coffee daily.   Social Determinants of Health   Financial Resource Strain: Not on  file  Food Insecurity: Not on file  Transportation Needs: Not on file  Physical Activity: Not on file  Stress: Not on file  Social Connections: Not on file  Intimate Partner Violence: Not on file    Family History  Problem Relation Age of Onset   Cancer Mother        breast, colon, ovarian   Cancer Brother 40       lung   Hyperlipidemia Brother    Cancer Other    Stroke Other    Cancer  Maternal Grandmother        ovarian   Anesthesia problems Neg Hx     ROS: no fevers or chills, productive cough, hemoptysis, dysphasia, odynophagia, melena, hematochezia, dysuria, hematuria, rash, seizure activity, orthopnea, PND, pedal edema, claudication. Remaining systems are negative.  Physical Exam: Well-developed well-nourished in no acute distress.  Skin is warm and dry.  HEENT is normal.  Neck is supple.  Chest is clear to auscultation with normal expansion.  Cardiovascular exam is regular rate and rhythm.  Abdominal exam nontender or distended. No masses palpated. Extremities show no edema. neuro grossly intact  ECG- personally reviewed  A/P  1 coronary artery disease-patient denies chest pain.  Plan to continue aspirin and Plavix.  Note recommendation was to continue Plavix indefinitely given history of residual coronary disease and vascular disease.  Continue statin.  2 hyperlipidemia-continue Crestor at present dose.  3 hypertension-patient's blood pressure is controlled.  Continue present medical regimen.  4 peripheral vascular disease-patient is followed by vascular surgery.  Continue aspirin and statin.  5 tobacco abuse-patient again counseled on discontinuing.  Kirk Ruths, MD

## 2020-11-22 ENCOUNTER — Telehealth: Payer: Self-pay | Admitting: *Deleted

## 2020-11-22 NOTE — Telephone Encounter (Signed)
   Telephone encounter was:  Unsuccessful.  11/22/2020 Name: Kimberly [name] MRN: [id number] DOB: June 26, 1950  Unsuccessful outbound call made today to assist with:  Food Insecurity  Outreach Attempt:  3rd Attempt.  Referral closed unable to contact patient.  A HIPAA compliant voice message was left requesting a return call.  Instructed patient to call back at   Instructed patient to call back at [phone]  at their earliest convenience. . [place], Care Management  [phone] [address] , [place] [address] Email : [email or web address]

## 2020-12-04 ENCOUNTER — Telehealth: Payer: Self-pay

## 2020-12-04 ENCOUNTER — Ambulatory Visit: Payer: Medicare HMO | Admitting: Cardiology

## 2020-12-04 ENCOUNTER — Telehealth: Payer: Self-pay | Admitting: Licensed Clinical Social Worker

## 2020-12-04 NOTE — Telephone Encounter (Signed)
CSW received call from patient who states she has been able to locate a new housing opportunity. Patient shared that the monthly rent will be $1,300 although she has also located a roommate who will share the monthly rent. Patient states she needs to have the deposit and 1st month rent before she can sign the lease. She states that the landlord has provided a letter outlining the plan for signing the lease and will bring it to CSW this afternoon. CSW will explore options for rental assistance once the letter is in hand and can confirm financial assistance. Patient grateful for the support and assistance. Raquel Sarna, [place], [place]

## 2020-12-04 NOTE — Telephone Encounter (Signed)
Left message for patient to that we need to reschedule appointment due to provider emergency.

## 2020-12-05 ENCOUNTER — Telehealth: Payer: Self-pay | Admitting: Licensed Clinical Social Worker

## 2020-12-05 NOTE — Telephone Encounter (Signed)
CSW received confirmation of Patient Care Fund assistance for security deposit for patient. CSW contacted landlord to confirm check will be mailed directly to landlord. CSW contacted patient to inform of approval and assistance. Patient overwhelmed with gratitude and appreciative of the assistance. Patient will contact landlord to schedule move in later this week. CSW available if needed in the future. Raquel Sarna, [place], [place]

## 2021-01-03 ENCOUNTER — Other Ambulatory Visit: Payer: Self-pay

## 2021-01-04 ENCOUNTER — Other Ambulatory Visit: Payer: Self-pay

## 2021-01-12 ENCOUNTER — Ambulatory Visit (HOSPITAL_COMMUNITY)
Admission: RE | Admit: 2021-01-12 | Discharge: 2021-01-12 | Disposition: A | Discharge status: Home / Self Care | Payer: Medicare HMO | Source: Ambulatory Visit | Attending: Surgery | Admitting: Surgery

## 2021-01-12 ENCOUNTER — Other Ambulatory Visit: Payer: Self-pay

## 2021-01-12 DIAGNOSIS — I70213 Atherosclerosis of native arteries of extremities with intermittent claudication, bilateral legs: Secondary | ICD-10-CM | POA: Insufficient documentation

## 2021-01-15 ENCOUNTER — Ambulatory Visit: Payer: Medicare HMO | Admitting: Surgery

## 2021-01-15 ENCOUNTER — Ambulatory Visit (HOSPITAL_COMMUNITY): Payer: Medicare HMO

## 2021-01-16 ENCOUNTER — Telehealth: Payer: Self-pay | Admitting: *Deleted

## 2021-01-16 NOTE — Telephone Encounter (Signed)
Patient was returning call. Please advise ?

## 2021-01-16 NOTE — Telephone Encounter (Signed)
Left message for pt to call, received refill request for lisinopril 5 mg once daily. Need to confirm currently taking and needs a follow up appointment.

## 2021-01-16 NOTE — Telephone Encounter (Signed)
Pt reports she is taking them every other day and has been since she started taking the medication for 2 weeks. She states she spoke with her neighbor and they suggested she take it every other day. After starting doing that pt states "I do feel better since doing that." Pt added to schedule per Amijah's previous note. Will get message over to Dr. Stanford Breed for review.

## 2021-01-16 NOTE — Telephone Encounter (Signed)
Left message for patient with Dr Creshaw's recommendations.   

## 2021-01-18 ENCOUNTER — Ambulatory Visit (HOSPITAL_COMMUNITY)
Admission: EM | Admit: 2021-01-18 | Discharge: 2021-01-18 | Disposition: A | Discharge status: Home / Self Care | Payer: Medicare HMO | Attending: Emergency Medicine | Admitting: Emergency Medicine

## 2021-01-18 ENCOUNTER — Other Ambulatory Visit: Payer: Self-pay

## 2021-01-18 DIAGNOSIS — H7292 Unspecified perforation of tympanic membrane, left ear: Secondary | ICD-10-CM

## 2021-01-18 MED ORDER — FLUTICASONE PROPIONATE 50 MCG/ACT NA SUSP: 1.0000 | Freq: Every day | NASAL | 1 refills | Status: AC

## 2021-01-18 MED ORDER — CETIRIZINE HCL 10 MG PO TABS: 10.0000 mg | ORAL_TABLET | Freq: Every day | ORAL | 0 refills | Status: DC

## 2021-01-18 MED ORDER — TRAMADOL HCL 50 MG PO TABS
50.0000 mg | ORAL_TABLET | Freq: Four times a day (QID) | ORAL | 0 refills | Status: AC | PRN
Start: 2021-01-18 — End: 2021-01-21

## 2021-01-18 MED ORDER — FLUTICASONE PROPIONATE 50 MCG/ACT NA SUSP: 1.0000 | Freq: Every day | NASAL | 1 refills | Status: DC

## 2021-01-18 MED ORDER — OFLOXACIN 0.3 % OT SOLN: 5.0000 [drp] | Freq: Two times a day (BID) | OTIC | 0 refills | Status: DC

## 2021-01-18 NOTE — ED Triage Notes (Signed)
Pt presents with left ear pain and bleeding today.She stated she heard a pop in her ear and then the bleeding started.

## 2021-01-18 NOTE — Discharge Instructions (Addendum)
An eardrum rupture is a hole (perforation) in the eardrum. The eardrum is a thin, round tissue inside of the ear that separates the ear canal from the middle ear. The eardrum is also called the tympanic membrane. It transfers sound vibrations through small bones in the middle ear to the hearing nerve in the inner ear. It also protects the middle earAn eardrum typically heals on its own within a few weeks. from germs. An eardrum rupture can cause pain and hearing loss.   Place 5 drops in left ear twice a day for one week so ear does not become infected  To help reduce secretions use nasal spray every morning to help clear sinus passages  May take Zyrtec every morning to help reduce secretions  May continue use of over-the-counter decongestant to help further thin secretions  Please keep upcoming appointment in 3 weeks for reevaluation of ear

## 2021-01-18 NOTE — ED Provider Notes (Signed)
[place]    CSN: [id number] Arrival date & time: 01/18/21  1654      History   Chief Complaint No chief complaint on file.   HPI Kimberly [name] is a 71 y.o. female.   Patient presents with left-sided ear pain beginning earlier this morning.  Endorses that she was sleeping when she heard a popping sound in severe pain started immediately after with bleeding from the left ear.  Popping sensation is still present as well as bleeding, ear has been stuffed with tissue to manage.  Has not attempted further treatment.  Associated nasal congestion, rhinorrhea and nonproductive cough.  Denies itching, decreased hearing.  History of asthma, fibromyalgia, hyperlipidemia, hypertension, PVD, stroke.  Past Medical History:  Diagnosis Date   Anemia    Angina    March 2013   Anxiety    Asthma    Blood transfusion    CAD (coronary artery disease)    11/22/19- PCI ostial RCA, mRCA   Depression    Fibromyalgia    History of lead poisoning 1977   Hyperlipidemia    Hypertension    Migraine    Peripheral vascular disease ([place])    claudication  right greater than left  femoral artery   Small bowel obstruction, partial ([place]) 04/08/11   Stroke ([place])    Subclavian artery disease ([place])    left   Uterine cancer ([place])    Pre cervical    Patient Active Problem List   Diagnosis Date Noted   Hypertension 11/23/2019   Unstable angina ([place]) 11/19/2019   Neck pain 08/28/2013   Wound infection after surgery 08/28/2013   Swelling of limb-Right neck 08/19/2013   Weakness 06/29/2012   Small bowel obstruction due to adhesions.  Enterolysis 04/10/2011. 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   Occlusion and stenosis of carotid artery without mention of cerebral infarction 03/25/2011   Atherosclerosis of native arteries of the extremities with intermittent claudication 03/14/2011   PVD  (peripheral vascular disease) with claudication ([place]) 02/06/2011   Subclavian artery stenosis ([place]) 01/28/2011   PAP SMEAR, LGSIL, ABNORMAL 03/30/2010   HYPERCHOLESTEROLEMIA 03/28/2010   INTERMITTENT CLAUDICATION 03/27/2010   LYMPHADENOPATHY 02/06/2010   TOBACCO ABUSE 12/07/2009   DENTAL PAIN 12/07/2009   MENOPAUSE, SURGICAL 01/07/2005    Past Surgical History:  Procedure Laterality Date   abdominal angiogram     ABDOMINAL ANGIOGRAM N/A 02/05/2011   Procedure: ABDOMINAL ANGIOGRAM;  Surgeon: Serafina Mitchell, MD;  Location: [facility] CATH LAB;  Service: Cardiovascular;  Laterality: N/A;   ABDOMINAL HYSTERECTOMY  2008   AORTA - BILATERAL FEMORAL ARTERY BYPASS GRAFT  02/26/2011   Procedure: AORTA BIFEMORAL BYPASS GRAFT;  Surgeon: Theotis Burrow, MD;  Location: [facility] OR;  Service: Vascular;  Laterality: N/A;   AORTIC ARCH ANGIOGRAPHY N/A 09/16/2017   Procedure: AORTIC ARCH ANGIOGRAPHY;  Surgeon: Serafina Mitchell, MD;  Location: [place] CV LAB;  Service: Cardiovascular;  Laterality: N/A;   AORTIC ARCH ANGIOGRAPHY N/A 01/19/2019   Procedure: AORTIC ARCH ANGIOGRAPHY;  Surgeon: Serafina Mitchell, MD;  Location: [place] CV LAB;  Service: Cardiovascular;  Laterality: N/A;  Left upper extremity angiography   ARCH AORTOGRAM N/A 06/24/2012   Procedure: ARCH AORTOGRAM;  Surgeon: Serafina Mitchell, MD;  Location: [facility] CATH LAB;  Service: Cardiovascular;  Laterality: N/A;   BILATERAL UPPER EXTREMITY ANGIOGRAM Left 08/12/2013   Procedure: UPPER EXTREMITY ANGIOGRAM;  Surgeon: Durene Fruits  Pierre Bali, MD;  Location: [place] CATH LAB;  Service: Cardiovascular;  Laterality: Left;   CAROTID ENDARTERECTOMY Left 06/13/2011   Left  CEA   CAROTID ENDARTERECTOMY Right 08-13-13   CEA   CHOLECYSTECTOMY N/A 12/08/2012   Procedure: LAPAROSCOPIC CHOLECYSTECTOMY WITH INTRAOPERATIVE CHOLANGIOGRAM;  Surgeon: Haywood Lasso, MD;  Location: [place];  Service: General;  Laterality: N/A;   COLONOSCOPY WITH PROPOFOL N/A 06/16/2014   Procedure:  COLONOSCOPY WITH PROPOFOL;  Surgeon: Garlan Fair, MD;  Location: [facility] ENDOSCOPY;  Service: Endoscopy;  Laterality: N/A;   CORONARY STENT INTERVENTION N/A 11/22/2019   Procedure: CORONARY STENT INTERVENTION;  Surgeon: Nelva Bush, MD;  Location: [place] CV LAB;  Service: Cardiovascular;  Laterality: N/A;   EMBOLECTOMY Left 04/30/2015   Procedure: EMBOLECTOMY BRACHIAL, Radial and ulnar arteries with patch angioplasty of brachial artery.;  Surgeon: Elam Dutch, MD;  Location: [place];  Service: Vascular;  Laterality: Left;   ENDARTERECTOMY  06/13/2011   Procedure: ENDARTERECTOMY CAROTID;  Surgeon: Serafina Mitchell, MD;  Location: [facility] OR;  Service: Vascular;  Laterality: Left;  Left carotid artery endarterectomy with vascu-guard patch angioplasty   ENDARTERECTOMY Right 08/13/2013   Procedure: RIGHT  CAROTID ENDARTERECTOMY CAROTID WITH BOVINE PERICARDIAL PATCH ANGIOPLASTY;  Surgeon: Serafina Mitchell, MD;  Location: [place];  Service: Vascular;  Laterality: Right;   ESOPHAGOGASTRODUODENOSCOPY (EGD) WITH PROPOFOL N/A 06/16/2014   Procedure: ESOPHAGOGASTRODUODENOSCOPY (EGD) WITH PROPOFOL;  Surgeon: Garlan Fair, MD;  Location: [facility] ENDOSCOPY;  Service: Endoscopy;  Laterality: N/A;   LAPAROTOMY  04/10/2011   Procedure: EXPLORATORY LAPAROTOMY;  Surgeon: Shann Medal, MD;  Location: [place];  Service: General;  Laterality: N/A;  ENTEROLYSIS OF ADHESIONS   LEFT HEART CATH AND CORONARY ANGIOGRAPHY N/A 11/19/2019   Procedure: LEFT HEART CATH AND CORONARY ANGIOGRAPHY;  Surgeon: Wellington Hampshire, MD;  Location: [place] CV LAB;  Service: Cardiovascular;  Laterality: N/A;   LOWER EXTREMITY ANGIOGRAM Left 06/24/12   Left stent intervention, left subclavian   PERIPHERAL VASCULAR BALLOON ANGIOPLASTY Left 09/16/2017   Procedure: PERIPHERAL VASCULAR BALLOON ANGIOPLASTY;  Surgeon: Serafina Mitchell, MD;  Location: [place] CV LAB;  Service: Cardiovascular;  Laterality: Left;  brachial artery   PERIPHERAL VASCULAR  BALLOON ANGIOPLASTY Left 01/19/2019   Procedure: PERIPHERAL VASCULAR BALLOON ANGIOPLASTY;  Surgeon: Serafina Mitchell, MD;  Location: [place] CV LAB;  Service: Cardiovascular;  Laterality: Left;  brachial artery   PERIPHERAL VASCULAR CATHETERIZATION N/A 04/26/2015   Procedure: Aortic Arch Angiography;  Surgeon: Serafina Mitchell, MD;  Location: [place] CV LAB;  Service: Cardiovascular;  Laterality: N/A;   PERIPHERAL VASCULAR CATHETERIZATION  04/26/2015   Procedure: Peripheral Vascular Intervention;  Surgeon: Serafina Mitchell, MD;  Location: [place] CV LAB;  Service: Cardiovascular;;  lt illiac   PERIPHERAL VASCULAR CATHETERIZATION N/A 12/05/2015   Procedure: Aortic Arch Angiography;  Surgeon: Serafina Mitchell, MD;  Location: [place] CV LAB;  Service: Cardiovascular;  Laterality: N/A;   PERIPHERAL VASCULAR CATHETERIZATION Left 12/05/2015   Procedure: Peripheral Vascular Intervention;  Surgeon: Serafina Mitchell, MD;  Location: [place] CV LAB;  Service: Cardiovascular;  Laterality: Left;  subclavian   PERIPHERAL VASCULAR INTERVENTION Left 09/16/2017   Procedure: PERIPHERAL VASCULAR INTERVENTION;  Surgeon: Serafina Mitchell, MD;  Location: [place] CV LAB;  Service: Cardiovascular;  Laterality: Left;  subclavian artery   PERIPHERAL VASCULAR INTERVENTION Left 01/19/2019   Procedure: PERIPHERAL VASCULAR INTERVENTION;  Surgeon: Serafina Mitchell, MD;  Location: [place] CV LAB;  Service:  Cardiovascular;  Laterality: Left;  subclavian stent   PR VEIN BYPASS GRAFT,AORTO-FEM-POP  02/26/11   UPPER EXTREMITY ANGIOGRAPHY Left 09/16/2017   Procedure: Upper Extremity Angiography;  Surgeon: Serafina Mitchell, MD;  Location: [place] CV LAB;  Service: Cardiovascular;  Laterality: Left;    OB History   No obstetric history on file.      Home Medications    Prior to Admission medications   Medication Sig Start Date End Date Taking? Authorizing Provider  acetaminophen (TYLENOL) 650 MG CR tablet  Take 1,300 mg by mouth every 8 (eight) hours as needed for pain.    [provider]  albuterol (PROVENTIL HFA;VENTOLIN HFA) 108 (90 Base) MCG/ACT inhaler Inhale 1-2 puffs into the lungs every 6 (six) hours as needed for wheezing or shortness of breath. 02/26/18   Vanessa Kick, MD  Aromatic Inhalants (VICKS VAPOINHALER IN) Inhale 1 puff into the lungs daily as needed (congestion).    [provider]  Ascorbic Acid (VITAMIN C) 1000 MG tablet Take 1,000 mg by mouth daily.    [provider]  ASPERCREME LIDOCAINE EX Apply 1 application topically daily as needed (back pain).    [provider]  aspirin EC 81 MG EC tablet Take 1 tablet (81 mg total) by mouth daily. Swallow whole. 11/23/19   Cheryln Manly, NP  Cholecalciferol (VITAMIN D3) 50 MCG (2000 UT) TABS Take 2,000 Units by mouth daily.     [provider]  clopidogrel (PLAVIX) 75 MG tablet Take 1 tablet (75 mg total) by mouth at bedtime. 10/18/20   Serafina Mitchell, MD  Cyanocobalamin (VITAMIN B-12) 5000 MCG TBDP Take 5,000 mcg by mouth daily.     [provider]  diphenhydrAMINE-zinc acetate (BENADRYL) cream Apply 1 application topically daily as needed for itching.    [provider]  ezetimibe (ZETIA) 10 MG tablet Take 1 tablet (10 mg total) by mouth daily. 07/13/20 10/11/20  Lelon Perla, MD  ferrous sulfate 325 (65 FE) MG tablet Take 325 mg by mouth every Monday, Wednesday, and Friday.     [provider]  loratadine (CLARITIN) 10 MG tablet Take 10 mg by mouth daily as needed for allergies.     [provider]  methylPREDNISolone (MEDROL DOSEPAK) 4 MG TBPK tablet Take as directed 06/28/20   Felipa Furnace, DPM  nitroGLYCERIN (NITROSTAT) 0.4 MG SL tablet Place 1 tablet (0.4 mg total) under the tongue every 5 (five) minutes as needed. 11/23/19   Cheryln Manly, NP  OVER THE COUNTER MEDICATION Take 1 tablet by mouth every other day. MK-7  (vit K + magnesium)     [provider]  rosuvastatin (CRESTOR) 40 MG tablet Take 1 tablet (40 mg total) by mouth daily. 09/19/20   Lelon Perla, MD    Family History Family History  Problem Relation Age of Onset   Cancer Mother        breast, colon, ovarian   Cancer Brother 57       lung   Hyperlipidemia Brother    Cancer Other    Stroke Other    Cancer Maternal Grandmother        ovarian   Anesthesia problems Neg Hx     Social History Social History   Tobacco Use   Smoking status: Some Days    Packs/day: 0.10    Years: 45.00    Pack years: 4.50    Types: Cigarettes   Smokeless tobacco: Never   Tobacco  comments:    pt states that she smokes about 6 cigs per day  Vaping Use   Vaping Use: Never used  Substance Use Topics   Alcohol use: No    Alcohol/week: 0.0 standard drinks   Drug use: No    Comment: History of Cocaine and marijuana abuse: "not in many years" (04/08/11     Allergies   Codeine, Hydrocodone, Other, Oxycodone, and Atorvastatin   Review of Systems Review of Systems  Constitutional: Negative.   HENT:  Positive for congestion, ear discharge, ear pain and rhinorrhea. Negative for dental problem, drooling, facial swelling, hearing loss, mouth sores, nosebleeds, postnasal drip, sinus pressure, sinus pain, sneezing, sore throat, tinnitus, trouble swallowing and voice change.   Respiratory: Negative.    Cardiovascular: Negative.   Gastrointestinal: Negative.   Skin: Negative.   Neurological: Negative.     Physical Exam Triage Vital Signs ED Triage Vitals [01/18/21 1809]  Enc Vitals Group     BP (!) 142/91     Pulse Rate 78     Resp 18     Temp 98.5 F (36.9 C)     Temp Source Oral     SpO2 99 %     Weight      Height      Head Circumference      Peak Flow      Pain Score      Pain Loc      Pain Edu?      Excl. in [place]?    No data found.  Updated Vital Signs BP (!) 142/91 (BP Location: Left Arm)    Pulse 78    Temp 98.5 F (36.9 C) (Oral)    Resp  18    SpO2 99%   Visual Acuity Right Eye Distance:   Left Eye Distance:   Bilateral Distance:    Right Eye Near:   Left Eye Near:    Bilateral Near:     Physical Exam Constitutional:      Appearance: Normal appearance.  HENT:     Head: Normocephalic.     Right Ear: Hearing, tympanic membrane, ear canal and external ear normal.     Left Ear: Hearing and external ear normal. Tympanic membrane is perforated.     Ears:     Comments: Bloody drainage left ear canal, tenderness present    Nose: Congestion and rhinorrhea present.  Eyes:     Extraocular Movements: Extraocular movements intact.  Pulmonary:     Effort: Pulmonary effort is normal.  Skin:    General: Skin is warm and dry.  Neurological:     Mental Status: She is alert. Mental status is at baseline.  Psychiatric:        Mood and Affect: Mood normal.        Behavior: Behavior normal.     UC Treatments / Results  Labs (all labs ordered are listed, but only abnormal results are displayed) Labs Reviewed - No data to display  EKG   Radiology No results found.  Procedures Procedures (including critical care time)  Medications Ordered in UC Medications - No data to display  Initial Impression / Assessment and Plan / UC Course  I have reviewed the triage vital signs and the nursing notes.  Pertinent labs & imaging results that were available during my care of the patient were reviewed by me and considered in my medical decision making (see chart for details).  Perforated eardrum, left  Etiology of symptoms is most  likely related to nasal and sinus pressure congestion and pressure, discussed with patient, ofloxacin drops prescribed for 7-day course to prevent infection, will attempt to further manage congestion in addition, cetirizine and fluticasone nasal spray ordered for daily use, tramadol 50 mg every 6 hours prescribed for management of pain, 12 tablets to be dispensed, PDMP reviewed, low risk, given walking  referral to ear nose and throat for reevaluation in 2 to 3 weeks Final Clinical Impressions(s) / UC Diagnoses   Final diagnoses:  None   Discharge Instructions   None    ED Prescriptions   None    PDMP not reviewed this encounter.   Hans Eden, [place] 01/21/21 [phone]

## 2021-01-24 DIAGNOSIS — H6502 Acute serous otitis media, left ear: Secondary | ICD-10-CM | POA: Diagnosis not present

## 2021-02-01 ENCOUNTER — Other Ambulatory Visit: Payer: Self-pay | Admitting: Cardiology

## 2021-02-05 ENCOUNTER — Other Ambulatory Visit: Payer: Self-pay

## 2021-02-07 ENCOUNTER — Telehealth: Payer: Self-pay

## 2021-02-07 NOTE — Telephone Encounter (Signed)
Called pt per request of vascular lab to confirm pt has not had any changes in her symptoms of BLE. She is returning to office next week. No questions/concerns at this time.

## 2021-02-12 ENCOUNTER — Ambulatory Visit (HOSPITAL_COMMUNITY)
Admission: RE | Admit: 2021-02-12 | Discharge: 2021-02-12 | Disposition: A | Discharge status: Home / Self Care | Payer: Medicare HMO | Source: Ambulatory Visit | Attending: Surgery | Admitting: Surgery

## 2021-02-12 ENCOUNTER — Other Ambulatory Visit: Payer: Self-pay

## 2021-02-12 ENCOUNTER — Ambulatory Visit: Payer: Medicare HMO | Admitting: Surgery

## 2021-02-12 ENCOUNTER — Encounter: Payer: Self-pay | Admitting: Surgery

## 2021-02-12 VITALS — BP 155/77 | HR 66 | Temp 97.6°F | Resp 14 | Ht 64.0 in | Wt 109.2 lb

## 2021-02-12 DIAGNOSIS — I70213 Atherosclerosis of native arteries of extremities with intermittent claudication, bilateral legs: Secondary | ICD-10-CM | POA: Diagnosis not present

## 2021-02-12 DIAGNOSIS — I6523 Occlusion and stenosis of bilateral carotid arteries: Secondary | ICD-10-CM | POA: Diagnosis not present

## 2021-02-12 NOTE — Progress Notes (Signed)
[facility] [place]  Patient name: Kimberly [name] MRN: [id number] DOB: 25-Apr-1950 Sex: female   REASON FOR VISIT:    Follow up  [place] ILLNESS:   Kimberly [name] is a 71 y.o. female who returns today for follow-up.  On 09/16/2017 she underwent stenting of her left subclavian artery and angioplasty of her left brachial artery.  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  She remained asymptomatic and was discharged home.   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  She went back for lysis of adhesions secondary to abdominal pain in April.  In June 2013 she underwent left carotid endarterectomy for a high-grade asymptomatic stenosis.  She then later presented with dizziness and temporary vision loss.  On 06/24/2012 she had stenting of her left subclavian artery.  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  On 08/13/2013 she underwent right carotid endarterectomy for an asymptomatic right carotid stenosis   She went for angiography because of a left subclavian in-stent stenosis on 04/27/2015.  She was found to have a high-grade stenosis and this was stented.  On 04/30/2015 she presented with acute ischemia to the left hand.  She was taken to the operating room by Dr. fields who performed a thrombectomy. On 12/05/2015 she underwent drug coated balloon angioplasty of a left subclavian in-stent stenosis.  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   She is complaining of a blurry type vision in both eyes that will come on once a day.   The patient is a smoker.  She is medically managed for hypertension which is been under good control.  She is not on cholesterol medicine for hypercholesterolemia.  She underwent  cardiac catheterization and PCI in November 2021.  PAST MEDICAL HISTORY:   Past Medical History:  Diagnosis Date   Anemia    Angina    March 2013   Anxiety    Asthma    Blood transfusion    CAD (coronary artery disease)    11/22/19- PCI ostial RCA, mRCA   Depression    Fibromyalgia    History of lead poisoning 1977   Hyperlipidemia    Hypertension    Migraine    Peripheral vascular disease ([place])    claudication  right greater than left  femoral artery   Small bowel obstruction, partial ([place]) 04/08/11   Stroke ([place])    Subclavian artery disease ([facility])    left   Uterine cancer ([place])    Pre cervical     FAMILY HISTORY:   Family History  Problem Relation Age of Onset   Cancer Mother        breast, colon, ovarian   Cancer Brother 39       lung   Hyperlipidemia Brother    Cancer Other    Stroke Other    Cancer Maternal Grandmother        ovarian   Anesthesia problems Neg Hx     SOCIAL HISTORY:   Social History   Tobacco Use   Smoking status: Some Days    Packs/day: 0.10    Years: 45.00    Pack years: 4.50    Types: Cigarettes   Smokeless tobacco: Never   Tobacco comments:    pt states that she smokes about 6 cigs per day  Substance Use Topics   Alcohol use: No    Alcohol/week: 0.0 standard drinks     ALLERGIES:   Allergies  Allergen Reactions   Codeine Nausea And Vomiting    Dizziness, headache, has passed out   Hydrocodone Nausea And Vomiting and Other (See Comments)    Headache and dizziness   Other Shortness Of Breath    Allergy to cologne/perfume base   Oxycodone Nausea And Vomiting and Other (See Comments)    Dizziness    Atorvastatin Diarrhea and Nausea And Vomiting    Causes GI symptoms if she takes it every day.     CURRENT MEDICATIONS:   Current Outpatient Medications  Medication Sig Dispense Refill   acetaminophen (TYLENOL) 650 MG CR tablet Take 1,300 mg by mouth every 8 (eight) hours as needed for pain.     albuterol (PROVENTIL  HFA;VENTOLIN HFA) 108 (90 Base) MCG/ACT inhaler Inhale 1-2 puffs into the lungs every 6 (six) hours as needed for wheezing or shortness of breath. 1 Inhaler 0   Aromatic Inhalants (VICKS VAPOINHALER IN) Inhale 1 puff into the lungs daily as needed (congestion).     Ascorbic Acid (VITAMIN C) 1000 MG tablet Take 1,000 mg by mouth daily.     ASPERCREME LIDOCAINE EX Apply 1 application topically daily as needed (back pain).     aspirin EC 81 MG EC tablet Take 1 tablet (81 mg total) by mouth daily. Swallow whole. 30 tablet 11   cetirizine (ZYRTEC ALLERGY) 10 MG tablet Take 1 tablet (10 mg total) by mouth daily. 30 tablet 0   Cholecalciferol (VITAMIN D3) 50 MCG (2000 UT) TABS Take 2,000 Units by mouth daily.      clopidogrel (PLAVIX) 75 MG tablet Take 1 tablet (75 mg total) by mouth at bedtime. 90 tablet 1   Cyanocobalamin (VITAMIN B-12) 5000 MCG TBDP Take 5,000 mcg by mouth daily.      diphenhydrAMINE-zinc acetate (BENADRYL) cream Apply 1 application topically daily as needed for itching.     ferrous sulfate 325 (65 FE) MG tablet Take 325 mg by mouth every Monday, Wednesday, and Friday.      fluticasone (FLONASE) 50 MCG/ACT nasal spray Place 1 spray into both nostrils daily. 11.1 mL 1   loratadine (CLARITIN) 10 MG tablet Take 10 mg by mouth daily as needed for allergies.      nitroGLYCERIN (NITROSTAT) 0.4 MG SL tablet DISSOLVE 1 TAB UNDER TONGUE FOR CHEST PAIN - IF PAIN REMAINS AFTER 5 MIN, CALL 911 AND REPEAT DOSE. MAX 3 TABS IN 15 MINUTES 25 tablet 1   ofloxacin (FLOXIN) 0.3 % OTIC solution Place 5 drops into the left ear 2 (two) times daily. 5 mL 0   OVER THE COUNTER MEDICATION Take 1 tablet by mouth every other day. MK-7  (vit K + magnesium)     rosuvastatin (CRESTOR) 40 MG tablet Take 1 tablet (40 mg total) by mouth daily. 30 tablet 6   ezetimibe (ZETIA) 10 MG tablet Take 1 tablet (10 mg total) by mouth daily. 90 tablet 3   methylPREDNISolone (MEDROL DOSEPAK) 4 MG TBPK tablet Take as directed 21  each 0   No current facility-administered medications for this visit.    REVIEW OF SYSTEMS:   [X]  denotes positive finding, [ ]  denotes negative finding Cardiac  Comments:  Chest pain or chest pressure:    Shortness of breath upon exertion:    Short of breath when lying flat:    Irregular heart rhythm:  Vascular    Pain in calf, thigh, or hip brought on by ambulation:    Pain in feet at night that wakes you up from your sleep:     Blood clot in your veins:    Leg swelling:         Pulmonary    Oxygen at home:    Productive cough:     Wheezing:         Neurologic    Sudden weakness in arms or legs:     Sudden numbness in arms or legs:     Sudden onset of difficulty speaking or slurred speech:    Temporary loss of vision in one eye:     Problems with dizziness:         Gastrointestinal    Blood in stool:     Vomited blood:         Genitourinary    Burning when urinating:     Blood in urine:        Psychiatric    Major depression:         Hematologic    Bleeding problems:    Problems with blood clotting too easily:        Skin    Rashes or ulcers:        Constitutional    Fever or chills:      PHYSICAL EXAM:   Vitals:   02/12/21 1401 02/12/21 1404  BP: (!) 140/57 (!) 155/77  Pulse: 66 66  Resp: 14   Temp: 97.6 F (36.4 C)   TempSrc: Temporal   SpO2: 97%   Weight: 49.5 kg   Height: 5\' 4"  (1.626 m)     GENERAL: The patient is a well-nourished female, in no acute distress. The vital signs are documented above. CARDIAC: There is a regular rate and rhythm.  VASCULAR: Palpable PT pulses bilaterally, right greater than left.  Palpable radial pulses bilaterally PULMONARY: Non-labored respirations MUSCULOSKELETAL: There are no major deformities or cyanosis. NEUROLOGIC: No focal weakness or paresthesias are detected. SKIN: There are no ulcers or rashes noted. PSYCHIATRIC: The patient has a normal affect.  STUDIES:   I have reviewed the  following: Right Carotid: Velocities in the right ICA are consistent with a 40-59%                 stenosis.   Left Carotid: Velocities in the left ICA are consistent with a 1-39%  stenosis.   Vertebrals:  Bilateral vertebral arteries demonstrate antegrade flow.  Subclavians: Normal flow hemodynamics were seen in bilateral subclavian               arteries. The left subclavian stent not well visualized, appears patent.    ABI/TBI Today's ABI Today's TBI Previous ABI Previous TBI   +-------+-----------+-----------+------------+------------+   Right   1.10        0.68        0.91         0.63           +-------+-----------+-----------+------------+------------+   Left    0.82        0.56        0.61         0.53           +-------+-----------+-----------+------------+------------+   MEDICAL ISSUES:   Lower extremity: She has a palpable posterior tibial pulse, right greater than left.  There are no ulcers.  She does not have claudication.  She will follow-up with  ABIs in 1 year  Carotid/subclavian: Ultrasound today did not show any significant stenosis.  She is without symptoms.  She will follow-up in 1 year with repeat duplex.  Continue aspirin statin and Plavix  Leia Alf, MD, FACS [facility] [phone] Pager [phone]

## 2021-03-19 NOTE — Progress Notes (Unsigned)
HPI:FU CAD. Patient does have a history of peripheral vascular disease and has had previous left subclavian artery stent and left brachial artery angioplasty, right brachial artery embolectomy, previous left carotid endarterectomy and aortobifemoral bypass graft.  Dopplers November 2021 showed patent aortobifemoral bypass graft.  ABIs mild on the right and moderate on the left.  Cardiac catheterization November 2021 showed 99% ostial right coronary artery, 70% proximal right coronary artery, 50% circumflex, 60% mid LAD and 50% second diagonal.  Ejection fraction 55 to 65%.  Patient subsequently had PCI of the right coronary artery with drug-eluting stents x2.  Echocardiogram November 2021 showed normal LV function, grade 1 diastolic dysfunction.  ABIs January 2023 normal on the right and mildly reduced on the left.  Carotid Dopplers February 2023 showed 40 to 59% right and 1 to 39% left stenosis.  Since last seen   Current Outpatient Medications  Medication Sig Dispense Refill   acetaminophen (TYLENOL) 650 MG CR tablet Take 1,300 mg by mouth every 8 (eight) hours as needed for pain.     albuterol (PROVENTIL HFA;VENTOLIN HFA) 108 (90 Base) MCG/ACT inhaler Inhale 1-2 puffs into the lungs every 6 (six) hours as needed for wheezing or shortness of breath. 1 Inhaler 0   Aromatic Inhalants (VICKS VAPOINHALER IN) Inhale 1 puff into the lungs daily as needed (congestion).     Ascorbic Acid (VITAMIN C) 1000 MG tablet Take 1,000 mg by mouth daily.     ASPERCREME LIDOCAINE EX Apply 1 application topically daily as needed (back pain).     aspirin EC 81 MG EC tablet Take 1 tablet (81 mg total) by mouth daily. Swallow whole. 30 tablet 11   cetirizine (ZYRTEC ALLERGY) 10 MG tablet Take 1 tablet (10 mg total) by mouth daily. 30 tablet 0   Cholecalciferol (VITAMIN D3) 50 MCG (2000 UT) TABS Take 2,000 Units by mouth daily.      clopidogrel (PLAVIX) 75 MG tablet Take 1 tablet (75 mg total) by mouth at bedtime. 90  tablet 1   Cyanocobalamin (VITAMIN B-12) 5000 MCG TBDP Take 5,000 mcg by mouth daily.      diphenhydrAMINE-zinc acetate (BENADRYL) cream Apply 1 application topically daily as needed for itching.     ezetimibe (ZETIA) 10 MG tablet Take 1 tablet (10 mg total) by mouth daily. 90 tablet 3   ferrous sulfate 325 (65 FE) MG tablet Take 325 mg by mouth every Monday, Wednesday, and Friday.      fluticasone (FLONASE) 50 MCG/ACT nasal spray Place 1 spray into both nostrils daily. 11.1 mL 1   loratadine (CLARITIN) 10 MG tablet Take 10 mg by mouth daily as needed for allergies.      methylPREDNISolone (MEDROL DOSEPAK) 4 MG TBPK tablet Take as directed 21 each 0   nitroGLYCERIN (NITROSTAT) 0.4 MG SL tablet DISSOLVE 1 TAB UNDER TONGUE FOR CHEST PAIN - IF PAIN REMAINS AFTER 5 MIN, CALL 911 AND REPEAT DOSE. MAX 3 TABS IN 15 MINUTES 25 tablet 1   ofloxacin (FLOXIN) 0.3 % OTIC solution Place 5 drops into the left ear 2 (two) times daily. 5 mL 0   OVER THE COUNTER MEDICATION Take 1 tablet by mouth every other day. MK-7  (vit K + magnesium)     rosuvastatin (CRESTOR) 40 MG tablet Take 1 tablet (40 mg total) by mouth daily. 30 tablet 6   No current facility-administered medications for this visit.     Past Medical History:  Diagnosis Date   Anemia  Angina    March 2013   Anxiety    Asthma    Blood transfusion    CAD (coronary artery disease)    11/22/19- PCI ostial RCA, mRCA   Depression    Fibromyalgia    History of lead poisoning 1977   Hyperlipidemia    Hypertension    Migraine    Peripheral vascular disease ([place])    claudication  right greater than left  femoral artery   Small bowel obstruction, partial ([place]) 04/08/11   Stroke ([place])    Subclavian artery disease ([place])    left   Uterine cancer ([place])    Pre cervical    Past Surgical History:  Procedure Laterality Date   abdominal angiogram     ABDOMINAL ANGIOGRAM N/A 02/05/2011   Procedure: ABDOMINAL ANGIOGRAM;  Surgeon: Serafina Mitchell,  MD;  [facility] CATH LAB;  Service: Cardiovascular;  Laterality: N/A;   ABDOMINAL HYSTERECTOMY  2008   AORTA - BILATERAL FEMORAL ARTERY BYPASS GRAFT  02/26/2011   Procedure: AORTA BIFEMORAL BYPASS GRAFT;  Surgeon: Theotis Burrow, MD;  Location: [facility] OR;  Service: Vascular;  Laterality: N/A;   AORTIC ARCH ANGIOGRAPHY N/A 09/16/2017   Procedure: AORTIC ARCH ANGIOGRAPHY;  Surgeon: Serafina Mitchell, MD;  Location: [place] CV LAB;  Service: Cardiovascular;  Laterality: N/A;   AORTIC ARCH ANGIOGRAPHY N/A 01/19/2019   Procedure: AORTIC ARCH ANGIOGRAPHY;  Surgeon: Serafina Mitchell, MD;  Location: [place] CV LAB;  Service: Cardiovascular;  Laterality: N/A;  Left upper extremity angiography   ARCH AORTOGRAM N/A 06/24/2012   Procedure: ARCH AORTOGRAM;  Surgeon: Serafina Mitchell, MD;  Location: [facility] CATH LAB;  Service: Cardiovascular;  Laterality: N/A;   BILATERAL UPPER EXTREMITY ANGIOGRAM Left 08/12/2013   Procedure: UPPER EXTREMITY ANGIOGRAM;  Surgeon: Serafina Mitchell, MD;  Location: [facility] CATH LAB;  Service: Cardiovascular;  Laterality: Left;   CAROTID ENDARTERECTOMY Left 06/13/2011   Left  CEA   CAROTID ENDARTERECTOMY Right 08-13-13   CEA   CHOLECYSTECTOMY N/A 12/08/2012   Procedure: LAPAROSCOPIC CHOLECYSTECTOMY WITH INTRAOPERATIVE CHOLANGIOGRAM;  Surgeon: Haywood Lasso, MD;  Location: [place];  Service: General;  Laterality: N/A;   COLONOSCOPY WITH PROPOFOL N/A 06/16/2014   Procedure: COLONOSCOPY WITH PROPOFOL;  Surgeon: Garlan Fair, MD;  Location: [facility] ENDOSCOPY;  Service: Endoscopy;  Laterality: N/A;   CORONARY STENT INTERVENTION N/A 11/22/2019   Procedure: CORONARY STENT INTERVENTION;  Surgeon: Nelva Bush, MD;  Location: [place] CV LAB;  Service: Cardiovascular;  Laterality: N/A;   EMBOLECTOMY Left 04/30/2015   Procedure: EMBOLECTOMY BRACHIAL, Radial and ulnar arteries with patch angioplasty of brachial artery.;  Surgeon: Elam Dutch, MD;  Location: [place];  Service: Vascular;   Laterality: Left;   ENDARTERECTOMY  06/13/2011   Procedure: ENDARTERECTOMY CAROTID;  Surgeon: Serafina Mitchell, MD;  Location: [facility] OR;  Service: Vascular;  Laterality: Left;  Left carotid artery endarterectomy with vascu-guard patch angioplasty   ENDARTERECTOMY Right 08/13/2013   Procedure: RIGHT  CAROTID ENDARTERECTOMY CAROTID WITH BOVINE PERICARDIAL PATCH ANGIOPLASTY;  Surgeon: Serafina Mitchell, MD;  Location: [place];  Service: Vascular;  Laterality: Right;   ESOPHAGOGASTRODUODENOSCOPY (EGD) WITH PROPOFOL N/A 06/16/2014   Procedure: ESOPHAGOGASTRODUODENOSCOPY (EGD) WITH PROPOFOL;  Surgeon: Garlan Fair, MD;  Location: [facility] ENDOSCOPY;  Service: Endoscopy;  Laterality: N/A;   LAPAROTOMY  04/10/2011   Procedure: EXPLORATORY LAPAROTOMY;  Surgeon: Shann Medal, MD;  Location: [place];  Service: General;  Laterality: N/A;  ENTEROLYSIS OF ADHESIONS   LEFT  HEART CATH AND CORONARY ANGIOGRAPHY N/A 11/19/2019   Procedure: LEFT HEART CATH AND CORONARY ANGIOGRAPHY;  Surgeon: Wellington Hampshire, MD;  Location: [place] CV LAB;  Service: Cardiovascular;  Laterality: N/A;   LOWER EXTREMITY ANGIOGRAM Left 06/24/12   Left stent intervention, left subclavian   PERIPHERAL VASCULAR BALLOON ANGIOPLASTY Left 09/16/2017   Procedure: PERIPHERAL VASCULAR BALLOON ANGIOPLASTY;  Surgeon: Serafina Mitchell, MD;  Location: [place] CV LAB;  Service: Cardiovascular;  Laterality: Left;  brachial artery   PERIPHERAL VASCULAR BALLOON ANGIOPLASTY Left 01/19/2019   Procedure: PERIPHERAL VASCULAR BALLOON ANGIOPLASTY;  Surgeon: Serafina Mitchell, MD;  Location: [place] CV LAB;  Service: Cardiovascular;  Laterality: Left;  brachial artery   PERIPHERAL VASCULAR CATHETERIZATION N/A 04/26/2015   Procedure: Aortic Arch Angiography;  Surgeon: Serafina Mitchell, MD;  Location: [place] CV LAB;  Service: Cardiovascular;  Laterality: N/A;   PERIPHERAL VASCULAR CATHETERIZATION  04/26/2015   Procedure: Peripheral Vascular Intervention;  Surgeon:  Serafina Mitchell, MD;  Location: [place] CV LAB;  Service: Cardiovascular;;  lt illiac   PERIPHERAL VASCULAR CATHETERIZATION N/A 12/05/2015   Procedure: Aortic Arch Angiography;  Surgeon: Serafina Mitchell, MD;  Location: [place] CV LAB;  Service: Cardiovascular;  Laterality: N/A;   PERIPHERAL VASCULAR CATHETERIZATION Left 12/05/2015   Procedure: Peripheral Vascular Intervention;  Surgeon: Serafina Mitchell, MD;  Location: [place] CV LAB;  Service: Cardiovascular;  Laterality: Left;  subclavian   PERIPHERAL VASCULAR INTERVENTION Left 09/16/2017   Procedure: PERIPHERAL VASCULAR INTERVENTION;  Surgeon: Serafina Mitchell, MD;  Location: [place] CV LAB;  Service: Cardiovascular;  Laterality: Left;  subclavian artery   PERIPHERAL VASCULAR INTERVENTION Left 01/19/2019   Procedure: PERIPHERAL VASCULAR INTERVENTION;  Surgeon: Serafina Mitchell, MD;  Location: [place] CV LAB;  Service: Cardiovascular;  Laterality: Left;  subclavian stent   PR VEIN BYPASS GRAFT,AORTO-FEM-POP  02/26/11   UPPER EXTREMITY ANGIOGRAPHY Left 09/16/2017   Procedure: Upper Extremity Angiography;  Surgeon: Serafina Mitchell, MD;  Location: [place] CV LAB;  Service: Cardiovascular;  Laterality: Left;    Social History   Socioeconomic History   Marital status: Unknown    Spouse name: Not on file   Number of children: 2   Years of education: Not on file   Highest education level: Not on file  Occupational History   Not on file  Tobacco Use   Smoking status: Some Days    Packs/day: 0.10    Years: 45.00    Pack years: 4.50    Types: Cigarettes   Smokeless tobacco: Never   Tobacco comments:    pt states that she smokes about 6 cigs per day  Vaping Use   Vaping Use: Never used  Substance and Sexual Activity   Alcohol use: No    Alcohol/week: 0.0 standard drinks   Drug use: No    Comment: History of Cocaine and marijuana abuse: "not in many years" (04/08/11   Sexual activity: Not Currently  Other Topics  Concern   Not on file  Social History Narrative   Pt lives at home with her 2 grandsons. She has been separated for 25yr, has two children, and has a 12th grade education level. She drinks 3 cups of coffee daily.   Social Determinants of Health   Financial Resource Strain: Not on file  Food Insecurity: Not on file  Transportation Needs: Not on file  Physical Activity: Not on file  Stress: Not on file  Social Connections: Not on file  Intimate Partner Violence: Not on file    Family History  Problem Relation Age of Onset   Cancer Mother        breast, colon, ovarian   Cancer Brother 48       lung   Hyperlipidemia Brother    Cancer Other    Stroke Other    Cancer Maternal Grandmother        ovarian   Anesthesia problems Neg Hx     ROS: no fevers or chills, productive cough, hemoptysis, dysphasia, odynophagia, melena, hematochezia, dysuria, hematuria, rash, seizure activity, orthopnea, PND, pedal edema, claudication. Remaining systems are negative.  Physical Exam: Well-developed well-nourished in no acute distress.  Skin is warm and dry.  HEENT is normal.  Neck is supple.  Chest is clear to auscultation with normal expansion.  Cardiovascular exam is regular rate and rhythm.  Abdominal exam nontender or distended. No masses palpated. Extremities show no edema. neuro grossly intact  ECG- personally reviewed  A/P  1 coronary artery disease-patient denies recurrent chest pain.  Plan to continue aspirin, Plavix and statin.  Given history of residual coronary disease and peripheral vascular disease plan is to continue Plavix indefinitely.  2 hypertension-patient's blood pressure is controlled.  Continue present medications and follow.  3 hyperlipidemia-continue Crestor at present dose (she was agreeable only to 20 mg daily in the past).  Check lipids and liver.  4 tobacco abuse-patient again counseled on discontinuing.  5 peripheral vascular disease-followed by vascular  surgery.  Kirk Ruths, MD

## 2021-03-26 ENCOUNTER — Ambulatory Visit: Payer: Medicare HMO | Admitting: Cardiology

## 2021-03-26 ENCOUNTER — Encounter: Payer: Self-pay | Admitting: Cardiology

## 2021-03-26 ENCOUNTER — Other Ambulatory Visit: Payer: Self-pay

## 2021-03-26 VITALS — BP 142/70 | HR 68 | Ht 64.0 in | Wt 111.2 lb

## 2021-03-26 DIAGNOSIS — I2510 Atherosclerotic heart disease of native coronary artery without angina pectoris: Secondary | ICD-10-CM

## 2021-03-26 DIAGNOSIS — I6523 Occlusion and stenosis of bilateral carotid arteries: Secondary | ICD-10-CM

## 2021-03-26 DIAGNOSIS — E785 Hyperlipidemia, unspecified: Secondary | ICD-10-CM | POA: Diagnosis not present

## 2021-03-26 DIAGNOSIS — I10 Essential (primary) hypertension: Secondary | ICD-10-CM

## 2021-03-26 MED ORDER — IRBESARTAN 150 MG PO TABS: 150.0000 mg | ORAL_TABLET | Freq: Every day | ORAL | 3 refills | Status: DC

## 2021-03-26 NOTE — Patient Instructions (Signed)
Medication Instructions:  ? ?START IRBESARTAN 150 MG ONCE DAILY ? ?*If you need a refill on your cardiac medications before your next appointment, please call your pharmacy* ? ? ?Lab Work: ? ?Your physician recommends that you return for lab work in: ONE WEEK-FASTING ? ?If you have labs (blood work) drawn today and your tests are completely normal, you will receive your results only by: ?MyChart Message (if you have MyChart) OR ?A paper copy in the mail ?If you have any lab test that is abnormal or we need to change your treatment, we will call you to review the results. ? ? ?Follow-Up: ?At [facility], you and your health needs are our priority.  As part of our continuing mission to provide you with exceptional heart care, we have created designated Provider Care Teams.  These Care Teams include your primary Cardiologist (physician) and Advanced Practice Providers (APPs -  Physician Assistants and Nurse Practitioners) who all work together to provide you with the care you need, when you need it. ? ?We recommend signing up for the patient portal called "MyChart".  Sign up information is provided on this After Visit Summary.  MyChart is used to connect with patients for Virtual Visits (Telemedicine).  Patients are able to view lab/test results, encounter notes, upcoming appointments, etc.  Non-urgent messages can be sent to your provider as well.   ?To learn more about what you can do with MyChart, go to [email or web address].   ? ?Your next appointment:   ?6 month(s) ? ?The format for your next appointment:   ?In Person ? ?Provider:   ?Kirk Ruths, MD   ? ? ?

## 2021-04-06 DIAGNOSIS — E785 Hyperlipidemia, unspecified: Secondary | ICD-10-CM | POA: Diagnosis not present

## 2021-04-06 DIAGNOSIS — I10 Essential (primary) hypertension: Secondary | ICD-10-CM | POA: Diagnosis not present

## 2021-04-07 LAB — COMPREHENSIVE METABOLIC PANEL
ALT: 14 IU/L (ref 0–32)
AST: 17 IU/L (ref 0–40)
Albumin/Globulin Ratio: 2.1 (ref 1.2–2.2)
Albumin: 4.5 g/dL (ref 3.8–4.8)
Alkaline Phosphatase: 76 IU/L (ref 44–121)
BUN/Creatinine Ratio: 24 (ref 12–28)
BUN: 23 mg/dL (ref 8–27)
Bilirubin Total: 0.4 mg/dL (ref 0.0–1.2)
CO2: 26 mmol/L (ref 20–29)
Calcium: 9.9 mg/dL (ref 8.7–10.3)
Chloride: 99 mmol/L (ref 96–106)
Creatinine, Ser: 0.94 mg/dL (ref 0.57–1.00)
Globulin, Total: 2.1 g/dL (ref 1.5–4.5)
Glucose: 82 mg/dL (ref 70–99)
Potassium: 4.5 mmol/L (ref 3.5–5.2)
Sodium: 138 mmol/L (ref 134–144)
Total Protein: 6.6 g/dL (ref 6.0–8.5)
eGFR: 66 mL/min/{1.73_m2} (ref >59)

## 2021-04-07 LAB — LIPID PANEL
Chol/HDL Ratio: 2.8 ratio (ref 0.0–4.4)
Cholesterol, Total: 191 mg/dL (ref 100–199)
HDL: 69 mg/dL (ref >39)
LDL Chol Calc (NIH): 107 mg/dL — ABNORMAL HIGH (ref 0–99)
Triglycerides: 84 mg/dL (ref 0–149)
VLDL Cholesterol Cal: 15 mg/dL (ref 5–40)

## 2021-04-17 ENCOUNTER — Other Ambulatory Visit: Payer: Self-pay | Admitting: *Deleted

## 2021-04-17 DIAGNOSIS — E785 Hyperlipidemia, unspecified: Secondary | ICD-10-CM

## 2021-04-27 DIAGNOSIS — I25118 Atherosclerotic heart disease of native coronary artery with other forms of angina pectoris: Secondary | ICD-10-CM | POA: Diagnosis not present

## 2021-04-27 DIAGNOSIS — E785 Hyperlipidemia, unspecified: Secondary | ICD-10-CM | POA: Diagnosis not present

## 2021-05-02 ENCOUNTER — Ambulatory Visit (INDEPENDENT_AMBULATORY_CARE_PROVIDER_SITE_OTHER): Payer: Medicare HMO | Admitting: Pharmacist Clinician (PhC)/ Clinical Pharmacy Specialist

## 2021-05-02 ENCOUNTER — Telehealth: Payer: Self-pay

## 2021-05-02 DIAGNOSIS — E7800 Pure hypercholesterolemia, unspecified: Secondary | ICD-10-CM

## 2021-05-02 DIAGNOSIS — E785 Hyperlipidemia, unspecified: Secondary | ICD-10-CM

## 2021-05-02 NOTE — Assessment & Plan Note (Signed)
Patient with elevated LDL cholesterol and ASCVD.  Currently tolerating rosuvastatin 20 mg bid qod.  Reviewed options for lowering LDL cholesterol, including ezetimibe, PCSK-9 inhibitors, bempedoic acid and inclisiran.  Discussed mechanisms of action, dosing, side effects and potential decreases in LDL cholesterol.  Also reviewed cost information and potential options for patient assistance.  Answered all patient questions.  Based on this information, patient would prefer to start Repatha 140 mg q14d.  Will get PA approved and have Haleigh help patient with [place].   ?

## 2021-05-02 NOTE — Progress Notes (Signed)
05/02/2021 ?Orest Dikes ?18-Apr-1950 ?[id number] ? ? ?HPI:  Kimberly [name] is a 71 y.o. female patient of Dr Stanford Breed, who presents today for a lipid clinic evaluation.  See pertinent past medical history below.   She was most recently seen by Dr. Stanford Breed last month and labs drawn shortly after that showed LDL at 107.  She had previously failed on atorvastatin and was started on rosuvastatin 40 mg.  She wasn't able to tolerate this, but did cut back to 20 mg twice daily every other day and has been able to take this.   ? ? ?Past Medical History: ?PAD L subclavian artery stent, L brachial artery angioplasty, right brachial artery embolectomy; aortobifemoral bypass; L carotid endarterectomy  ?CAD Cath 11/21 showed 99% ostial RCA, 70% proximal RCA,  60% mid LAD  ?stroke   ?hypertension At last visit   started irbesartan 150 mg  ?   ? ? ?Current Medications: ezetimibe 10 mg qd, rosuvastatin 20 mg qd ? ?Cholesterol Goals: LDL < 55 ?  ?Intolerant/previously tried: atorvastatin - N/V/D ? ?Family history: maternal g-grandomther had heat;  mother died from cancer at 42; don't know father history; brother died from cancer at 29; grandmohter - cancer; 2 children - one healthy, one with addiction issues; pt has custody of 2 teenage grandchildren ? ?Diet: avoids dairy for the most part, eats lots of salmon, baked chicken; rarely eats out; lots of vegetables (mostly fresh), canned home grown veggies, all organic ? ?Exercise:  woks in yard daily, walks dogs daily (one big one little) ? ?Labs: 3/23;  TC 191, TG 84, HDL 69, LDL 107  ? ? ?Current Outpatient Medications  ?Medication Sig Dispense Refill  ? acetaminophen (TYLENOL) 650 MG CR tablet Take 1,300 mg by mouth every 8 (eight) hours as needed for pain.    ? albuterol (PROVENTIL HFA;VENTOLIN HFA) 108 (90 Base) MCG/ACT inhaler Inhale 1-2 puffs into the lungs every 6 (six) hours as needed for wheezing or shortness of breath. 1 Inhaler 0  ? Aromatic Inhalants (VICKS VAPOINHALER  IN) Inhale 1 puff into the lungs daily as needed (congestion).    ? Ascorbic Acid (VITAMIN C) 1000 MG tablet Take 1,000 mg by mouth daily.    ? ASPERCREME LIDOCAINE EX Apply 1 application topically daily as needed (back pain).    ? aspirin EC 81 MG EC tablet Take 1 tablet (81 mg total) by mouth daily. Swallow whole. 30 tablet 11  ? cetirizine (ZYRTEC ALLERGY) 10 MG tablet Take 1 tablet (10 mg total) by mouth daily. 30 tablet 0  ? Cholecalciferol (VITAMIN D3) 50 MCG (2000 UT) TABS Take 2,000 Units by mouth daily.     ? clopidogrel (PLAVIX) 75 MG tablet Take 1 tablet (75 mg total) by mouth at bedtime. 90 tablet 1  ? Cyanocobalamin (VITAMIN B-12) 5000 MCG TBDP Take 5,000 mcg by mouth daily.     ? diphenhydrAMINE-zinc acetate (BENADRYL) cream Apply 1 application topically daily as needed for itching.    ? ezetimibe (ZETIA) 10 MG tablet Take 1 tablet (10 mg total) by mouth daily. 90 tablet 3  ? ferrous sulfate 325 (65 FE) MG tablet Take 325 mg by mouth every Monday, Wednesday, and Friday.     ? fluticasone (FLONASE) 50 MCG/ACT nasal spray Place 1 spray into both nostrils daily. 11.1 mL 1  ? loratadine (CLARITIN) 10 MG tablet Take 10 mg by mouth daily as needed for allergies.     ? nitroGLYCERIN (NITROSTAT) 0.4 MG SL tablet  DISSOLVE 1 TAB UNDER TONGUE FOR CHEST PAIN - IF PAIN REMAINS AFTER 5 MIN, CALL 911 AND REPEAT DOSE. MAX 3 TABS IN 15 MINUTES 25 tablet 1  ? OVER THE COUNTER MEDICATION Take 1 tablet by mouth every other day. MK-7  (vit K + magnesium)    ? rosuvastatin (CRESTOR) 40 MG tablet Take 1 tablet (40 mg total) by mouth daily. 30 tablet 6  ? ?No current facility-administered medications for this visit.  ? ? ?Allergies  ?Allergen Reactions  ? Codeine Nausea And Vomiting and Nausea Only  ?  Dizziness, headache, has passed out ?Dizzy and sick  ? Hydrocodone Nausea And Vomiting and Other (See Comments)  ?  Headache and dizziness  ? Other Shortness Of Breath  ?  Allergy to cologne/perfume base  ? Oxycodone Nausea And  Vomiting and Other (See Comments)  ?  Dizziness   ? Atorvastatin Diarrhea and Nausea And Vomiting  ?  Causes GI symptoms if she takes it every day.  ? Crestor [Rosuvastatin]   ?  Dizzy and nauseous  ? ? ?Past Medical History:  ?Diagnosis Date  ? Anemia   ? Angina   ? March 2013  ? Anxiety   ? Asthma   ? Blood transfusion   ? CAD (coronary artery disease)   ? 11/22/19- PCI ostial RCA, mRCA  ? Depression   ? Fibromyalgia   ? History of lead poisoning 1977  ? Hyperlipidemia   ? Hypertension   ? Migraine   ? Peripheral vascular disease ([place])   ? claudication  right greater than left  femoral artery  ? Small bowel obstruction, partial ([place]) 04/08/11  ? Stroke [facility])   ? Subclavian artery disease ([place])   ? left  ? Uterine cancer ([place])   ? Pre cervical  ? ? ?Blood pressure 112/70, pulse 60, resp. rate 16, height '5\' 4"'$  (1.626 m), weight 112 lb 9.6 oz (51.1 kg), SpO2 100 %. ? ? ?HYPERCHOLESTEROLEMIA ?Patient with elevated LDL cholesterol and ASCVD.  Currently tolerating rosuvastatin 20 mg bid qod.  Reviewed options for lowering LDL cholesterol, including ezetimibe, PCSK-9 inhibitors, bempedoic acid and inclisiran.  Discussed mechanisms of action, dosing, side effects and potential decreases in LDL cholesterol.  Also reviewed cost information and potential options for patient assistance.  Answered all patient questions.  Based on this information, patient would prefer to start Repatha 140 mg q14d.  Will get PA approved and have Haleigh help patient with [place].   ? ? ?Tommy Medal PharmD CPP [facility] ?[place] [address] ?[place], [place] [address] ?[phone] ? ? ? ?

## 2021-05-02 NOTE — Patient Instructions (Addendum)
Your Results: ?           ? Your most recent labs Goal  ?Total Cholesterol 191 < 200  ?Triglycerides 84 < 150  ?HDL (happy/good cholesterol) 69 > 40  ?LDL (lousy/bad cholesterol 107 < 55  ? ?Medication changes: ? We will start the process to get Repatha covered by your insurance.  Once approved, use one pen every 14 days.  You will need to repeat labs after 4-6 doses (about 2-3 months) ? ?Patient Assistance:  The [employer] foundation offers assistance to help pay for medication copays.  They will cover copays for all cholesterol lowering meds, including statins, fibrates, omega-3 oils, ezetimibe, Repatha, Praluent, Nexletol, Nexlizet.  The cards are usually good for $2,500 or 12 months, whichever comes first. ?Go to healthwellfoundation.org ?Click on ?Apply Now? ?Answer questions as to whom is applying (patient or representative) ?Your disease fund will be ?hypercholesterolemia - Medicare access? ?They will ask questions about finances and which medications you are taking for cholesterol ?When you submit, the approval is usually within minutes.  You will need to print the card information from the site ?You will need to show this information to your pharmacy, they will bill your Medicare Part D plan first -then bill [employer] --for the copay.   ?You can also call them at [phone], although the hold times can be quite long.  ? ?Thank you for choosing [facility]  ? ?

## 2021-05-02 NOTE — Telephone Encounter (Signed)
-----   Message from Rockne Menghini, RPH-CPP sent at 05/02/2021  3:39 PM EDT ----- ?Regarding: Repatha ?Could you please do the PA and then help her with [employer] to pay for Reaptha, rosuvastatin and ezetimibe. ? ?Thank you! ? ?

## 2021-05-02 NOTE — Telephone Encounter (Signed)
Will provide healthwell information to the pharmacy once pa approved for repatha and the healthwell grant should cover crestor and zetia as well as repatha.  ? ?Lipid/hepatic panel ordered and released for post 4th dose  ? ?REPATHA PA SENT TO PLAN: ?CHAKIRA JACHIM ([employer] Shuck) - [id number] ?Repatha SureClick '140MG'$ /ML auto-injectors ?Status: PA Request ?Created: April 26th, 2023 ?Sent: April 26th, 2023 ? ?[employer] GRANT APPROVED: ?Pharmacy Card ?CARD NO. ?[id number] ?  ?CARD STATUS ?Active ?  ?BIN ?[id number] ?  ?PCN ?[id number] ?  ?PC GROUP ?[id number] ?  ?HELP DESK ?[phone] ?

## 2021-05-03 MED ORDER — EZETIMIBE 10 MG PO TABS: 10.0000 mg | ORAL_TABLET | Freq: Every day | ORAL | 3 refills | Status: DC

## 2021-05-03 MED ORDER — ROSUVASTATIN CALCIUM 40 MG PO TABS: 40.0000 mg | ORAL_TABLET | Freq: Every day | ORAL | 6 refills | Status: DC

## 2021-05-03 MED ORDER — REPATHA SURECLICK 140 MG/ML ~~LOC~~ SOAJ: 140.0000 mg | SUBCUTANEOUS | 11 refills | Status: DC

## 2021-05-03 NOTE — Telephone Encounter (Signed)
Called and lmomed the pt that th repatha was approved and so was healthwell grant which I added to crestor and the zetia as well so that they should all be free in a note to pharmacy. Instructed the pt to complete fasting lab work post 4th dose.  ?

## 2021-05-03 NOTE — Addendum Note (Signed)
Addended by: Allean Found on: 05/03/2021 10:52 AM ? ? Modules accepted: Orders ? ?

## 2021-05-03 NOTE — Telephone Encounter (Signed)
Attempted to call the pharmacy to see if they were able to process grants for all those lipid meds and they stated that they couldn't help me at this time as they were to busy  ?

## 2021-05-04 ENCOUNTER — Other Ambulatory Visit: Payer: Self-pay | Admitting: Internal Medicine

## 2021-05-04 DIAGNOSIS — Z1339 Encounter for screening examination for other mental health and behavioral disorders: Secondary | ICD-10-CM | POA: Diagnosis not present

## 2021-05-04 DIAGNOSIS — Z0000 Encounter for general adult medical examination without abnormal findings: Secondary | ICD-10-CM

## 2021-05-04 DIAGNOSIS — E785 Hyperlipidemia, unspecified: Secondary | ICD-10-CM | POA: Diagnosis not present

## 2021-05-04 DIAGNOSIS — W57XXXA Bitten or stung by nonvenomous insect and other nonvenomous arthropods, initial encounter: Secondary | ICD-10-CM | POA: Diagnosis not present

## 2021-05-04 DIAGNOSIS — I25118 Atherosclerotic heart disease of native coronary artery with other forms of angina pectoris: Secondary | ICD-10-CM | POA: Diagnosis not present

## 2021-05-04 DIAGNOSIS — Z1331 Encounter for screening for depression: Secondary | ICD-10-CM | POA: Diagnosis not present

## 2021-05-04 DIAGNOSIS — I739 Peripheral vascular disease, unspecified: Secondary | ICD-10-CM | POA: Diagnosis not present

## 2021-05-04 DIAGNOSIS — D692 Other nonthrombocytopenic purpura: Secondary | ICD-10-CM | POA: Diagnosis not present

## 2021-05-04 DIAGNOSIS — R002 Palpitations: Secondary | ICD-10-CM | POA: Diagnosis not present

## 2021-05-04 DIAGNOSIS — E46 Unspecified protein-calorie malnutrition: Secondary | ICD-10-CM | POA: Diagnosis not present

## 2021-05-04 DIAGNOSIS — I10 Essential (primary) hypertension: Secondary | ICD-10-CM | POA: Diagnosis not present

## 2021-05-07 ENCOUNTER — Other Ambulatory Visit: Payer: Self-pay | Admitting: Internal Medicine

## 2021-05-07 DIAGNOSIS — Z1231 Encounter for screening mammogram for malignant neoplasm of breast: Secondary | ICD-10-CM

## 2021-06-11 ENCOUNTER — Other Ambulatory Visit: Payer: Self-pay | Admitting: Internal Medicine

## 2021-06-11 DIAGNOSIS — Z0000 Encounter for general adult medical examination without abnormal findings: Secondary | ICD-10-CM

## 2021-06-13 ENCOUNTER — Ambulatory Visit
Admission: RE | Admit: 2021-06-13 | Discharge: 2021-06-13 | Disposition: A | Discharge status: Home / Self Care | Payer: Medicare HMO | Source: Ambulatory Visit | Attending: Internal Medicine | Admitting: Internal Medicine

## 2021-06-13 DIAGNOSIS — Z87891 Personal history of nicotine dependence: Secondary | ICD-10-CM | POA: Diagnosis not present

## 2021-06-13 DIAGNOSIS — I2510 Atherosclerotic heart disease of native coronary artery without angina pectoris: Secondary | ICD-10-CM | POA: Diagnosis not present

## 2021-06-13 DIAGNOSIS — J180 Bronchopneumonia, unspecified organism: Secondary | ICD-10-CM | POA: Diagnosis not present

## 2021-06-13 DIAGNOSIS — N200 Calculus of kidney: Secondary | ICD-10-CM | POA: Diagnosis not present

## 2021-06-13 DIAGNOSIS — Z0000 Encounter for general adult medical examination without abnormal findings: Secondary | ICD-10-CM

## 2021-06-27 ENCOUNTER — Other Ambulatory Visit: Payer: Self-pay

## 2021-06-27 DIAGNOSIS — E785 Hyperlipidemia, unspecified: Secondary | ICD-10-CM

## 2021-06-27 MED ORDER — EZETIMIBE 10 MG PO TABS: 10.0000 mg | ORAL_TABLET | Freq: Every day | ORAL | 1 refills | Status: DC

## 2021-07-04 ENCOUNTER — Ambulatory Visit
Admission: RE | Admit: 2021-07-04 | Discharge: 2021-07-04 | Disposition: A | Discharge status: Home / Self Care | Payer: Medicare HMO | Source: Ambulatory Visit | Attending: Internal Medicine | Admitting: Internal Medicine

## 2021-07-04 DIAGNOSIS — Z1231 Encounter for screening mammogram for malignant neoplasm of breast: Secondary | ICD-10-CM | POA: Diagnosis not present

## 2021-08-24 ENCOUNTER — Other Ambulatory Visit: Payer: Self-pay | Admitting: Physician Assistant

## 2021-09-21 NOTE — Progress Notes (Signed)
HPI: FU CAD. Patient does have a history of peripheral vascular disease and has had previous left subclavian artery stent and left brachial artery angioplasty, right brachial artery embolectomy, previous left carotid endarterectomy and aortobifemoral bypass graft. Cardiac catheterization November 2021 showed 99% ostial right coronary artery, 70% proximal right coronary artery, 50% circumflex, 60% mid LAD and 50% second diagonal.  Ejection fraction 55 to 65%.  Patient subsequently had PCI of the right coronary artery with drug-eluting stents x2.  Echocardiogram November 2021 showed normal LV function, grade 1 diastolic dysfunction. ABIs January 2023 normal on the right and mildly reduced on the left.  Carotid Dopplers February 2023 showed 40 to 59% right and 1 to 39% left stenosis.  Since last seen she has some dyspnea on exertion but denies chest pain.  No orthopnea, PND, pedal edema or syncope.  She is grieving over the recent loss of her ex-husband.  Current Outpatient Medications  Medication Sig Dispense Refill   acetaminophen (TYLENOL) 650 MG CR tablet Take 1,300 mg by mouth every 8 (eight) hours as needed for pain.     Ascorbic Acid (VITAMIN C) 1000 MG tablet Take 1,000 mg by mouth daily.     ASPERCREME LIDOCAINE EX Apply 1 application topically daily as needed (back pain).     Cholecalciferol (VITAMIN D3) 50 MCG (2000 UT) TABS Take 2,000 Units by mouth daily.      clopidogrel (PLAVIX) 75 MG tablet TAKE 1 TABLET AT BEDTIME 90 tablet 1   Cyanocobalamin (VITAMIN B-12) 5000 MCG TBDP Take 5,000 mcg by mouth daily.      diphenhydrAMINE-zinc acetate (BENADRYL) cream Apply 1 application topically daily as needed for itching.     Evolocumab (REPATHA SURECLICK) 628 MG/ML SOAJ Inject 140 mg into the skin every 14 (fourteen) days. 2 mL 11   ezetimibe (ZETIA) 10 MG tablet Take 1 tablet (10 mg total) by mouth daily. 30 tablet 1   ferrous sulfate 325 (65 FE) MG tablet Take 325 mg by mouth every Monday,  Wednesday, and Friday.      fluticasone (FLONASE) 50 MCG/ACT nasal spray Place 1 spray into both nostrils daily. 11.1 mL 1   rosuvastatin (CRESTOR) 40 MG tablet Take 1 tablet (40 mg total) by mouth daily. 30 tablet 6   albuterol (PROVENTIL HFA;VENTOLIN HFA) 108 (90 Base) MCG/ACT inhaler Inhale 1-2 puffs into the lungs every 6 (six) hours as needed for wheezing or shortness of breath. (Patient not taking: Reported on 10/04/2021) 1 Inhaler 0   Aromatic Inhalants (VICKS VAPOINHALER IN) Inhale 1 puff into the lungs daily as needed (congestion). (Patient not taking: Reported on 10/04/2021)     aspirin EC 81 MG EC tablet Take 1 tablet (81 mg total) by mouth daily. Swallow whole. (Patient not taking: Reported on 10/04/2021) 30 tablet 11   cetirizine (ZYRTEC ALLERGY) 10 MG tablet Take 1 tablet (10 mg total) by mouth daily. (Patient not taking: Reported on 10/04/2021) 30 tablet 0   doxycycline (VIBRAMYCIN) 100 MG capsule Take 100 mg by mouth 2 (two) times daily.     loratadine (CLARITIN) 10 MG tablet Take 10 mg by mouth daily as needed for allergies.  (Patient not taking: Reported on 10/04/2021)     nitroGLYCERIN (NITROSTAT) 0.4 MG SL tablet DISSOLVE 1 TAB UNDER TONGUE FOR CHEST PAIN - IF PAIN REMAINS AFTER 5 MIN, CALL 911 AND REPEAT DOSE. MAX 3 TABS IN 15 MINUTES (Patient not taking: Reported on 10/04/2021) 25 tablet 1   OVER THE COUNTER  MEDICATION Take 1 tablet by mouth every other day. MK-7  (vit K + magnesium) (Patient not taking: Reported on 10/04/2021)     No current facility-administered medications for this visit.     Past Medical History:  Diagnosis Date   Anemia    Angina    March 2013   Anxiety    Asthma    Blood transfusion    CAD (coronary artery disease)    11/22/19- PCI ostial RCA, mRCA   Depression    Fibromyalgia    History of lead poisoning 1977   Hyperlipidemia    Hypertension    Migraine    Peripheral vascular disease ([place])    claudication  right greater than left  femoral artery    Small bowel obstruction, partial ([place]) 04/08/11   Stroke ([place])    Subclavian artery disease ([place])    left   Uterine cancer ([place])    Pre cervical    Past Surgical History:  Procedure Laterality Date   abdominal angiogram     ABDOMINAL ANGIOGRAM N/A 02/05/2011   Procedure: ABDOMINAL ANGIOGRAM;  Surgeon: Serafina Mitchell, MD;  Location: [facility] CATH LAB;  Service: Cardiovascular;  Laterality: N/A;   ABDOMINAL HYSTERECTOMY  2008   AORTA - BILATERAL FEMORAL ARTERY BYPASS GRAFT  02/26/2011   Procedure: AORTA BIFEMORAL BYPASS GRAFT;  Surgeon: Theotis Burrow, MD;  Location: [facility] OR;  Service: Vascular;  Laterality: N/A;   AORTIC ARCH ANGIOGRAPHY N/A 09/16/2017   Procedure: AORTIC ARCH ANGIOGRAPHY;  Surgeon: Serafina Mitchell, MD;  Location: [place] CV LAB;  Service: Cardiovascular;  Laterality: N/A;   AORTIC ARCH ANGIOGRAPHY N/A 01/19/2019   Procedure: AORTIC ARCH ANGIOGRAPHY;  Surgeon: Serafina Mitchell, MD;  Location: [place] CV LAB;  Service: Cardiovascular;  Laterality: N/A;  Left upper extremity angiography   ARCH AORTOGRAM N/A 06/24/2012   Procedure: ARCH AORTOGRAM;  Surgeon: Serafina Mitchell, MD;  Location: [facility] CATH LAB;  Service: Cardiovascular;  Laterality: N/A;   BILATERAL UPPER EXTREMITY ANGIOGRAM Left 08/12/2013   Procedure: UPPER EXTREMITY ANGIOGRAM;  Surgeon: Serafina Mitchell, MD;  Location: [facility] CATH LAB;  Service: Cardiovascular;  Laterality: Left;   CAROTID ENDARTERECTOMY Left 06/13/2011   Left  CEA   CAROTID ENDARTERECTOMY Right 08-13-13   CEA   CHOLECYSTECTOMY N/A 12/08/2012   Procedure: LAPAROSCOPIC CHOLECYSTECTOMY WITH INTRAOPERATIVE CHOLANGIOGRAM;  Surgeon: Haywood Lasso, MD;  Location: [place];  Service: General;  Laterality: N/A;   COLONOSCOPY WITH PROPOFOL N/A 06/16/2014   Procedure: COLONOSCOPY WITH PROPOFOL;  Surgeon: Garlan Fair, MD;  Location: [facility] ENDOSCOPY;  Service: Endoscopy;  Laterality: N/A;   CORONARY STENT INTERVENTION N/A 11/22/2019   Procedure: CORONARY STENT  INTERVENTION;  Surgeon: Nelva Bush, MD;  Location: [address] CV LAB;  Service: Cardiovascular;  Laterality: N/A;   EMBOLECTOMY Left 04/30/2015   Procedure: EMBOLECTOMY BRACHIAL, Radial and ulnar arteries with patch angioplasty of brachial artery.;  Surgeon: Elam Dutch, MD;  Location: [facility] OR;  Service: Vascular;  Laterality: Left;   ENDARTERECTOMY  06/13/2011   Procedure: ENDARTERECTOMY CAROTID;  Surgeon: Serafina Mitchell, MD;  Location: [facility] OR;  Service: Vascular;  Laterality: Left;  Left carotid artery endarterectomy with vascu-guard patch angioplasty   ENDARTERECTOMY Right 08/13/2013   Procedure: RIGHT  CAROTID ENDARTERECTOMY CAROTID WITH BOVINE PERICARDIAL PATCH ANGIOPLASTY;  Surgeon: Serafina Mitchell, MD;  Location: [place];  Service: Vascular;  Laterality: Right;   ESOPHAGOGASTRODUODENOSCOPY (EGD) WITH PROPOFOL N/A 06/16/2014   Procedure: ESOPHAGOGASTRODUODENOSCOPY (EGD) WITH PROPOFOL;  Surgeon: Garlan Fair, MD;  Location: [facility] ENDOSCOPY;  Service: Endoscopy;  Laterality: N/A;   LAPAROTOMY  04/10/2011   Procedure: EXPLORATORY LAPAROTOMY;  Surgeon: Shann Medal, MD;  Location: [place];  Service: General;  Laterality: N/A;  ENTEROLYSIS OF ADHESIONS   LEFT HEART CATH AND CORONARY ANGIOGRAPHY N/A 11/19/2019   Procedure: LEFT HEART CATH AND CORONARY ANGIOGRAPHY;  Surgeon: Wellington Hampshire, MD;  Location: [place] CV LAB;  Service: Cardiovascular;  Laterality: N/A;   LOWER EXTREMITY ANGIOGRAM Left 06/24/12   Left stent intervention, left subclavian   PERIPHERAL VASCULAR BALLOON ANGIOPLASTY Left 09/16/2017   Procedure: PERIPHERAL VASCULAR BALLOON ANGIOPLASTY;  Surgeon: Serafina Mitchell, MD;  Location: [place] CV LAB;  Service: Cardiovascular;  Laterality: Left;  brachial artery   PERIPHERAL VASCULAR BALLOON ANGIOPLASTY Left 01/19/2019   Procedure: PERIPHERAL VASCULAR BALLOON ANGIOPLASTY;  Surgeon: Serafina Mitchell, MD;  Location: [place] CV LAB;  Service: Cardiovascular;  Laterality: Left;   brachial artery   PERIPHERAL VASCULAR CATHETERIZATION N/A 04/26/2015   Procedure: Aortic Arch Angiography;  Surgeon: Serafina Mitchell, MD;  Location: [place] CV LAB;  Service: Cardiovascular;  Laterality: N/A;   PERIPHERAL VASCULAR CATHETERIZATION  04/26/2015   Procedure: Peripheral Vascular Intervention;  Surgeon: Serafina Mitchell, MD;  Location: [place] CV LAB;  Service: Cardiovascular;;  lt illiac   PERIPHERAL VASCULAR CATHETERIZATION N/A 12/05/2015   Procedure: Aortic Arch Angiography;  Surgeon: Serafina Mitchell, MD;  Location: [place] CV LAB;  Service: Cardiovascular;  Laterality: N/A;   PERIPHERAL VASCULAR CATHETERIZATION Left 12/05/2015   Procedure: Peripheral Vascular Intervention;  Surgeon: Serafina Mitchell, MD;  Location: [place] CV LAB;  Service: Cardiovascular;  Laterality: Left;  subclavian   PERIPHERAL VASCULAR INTERVENTION Left 09/16/2017   Procedure: PERIPHERAL VASCULAR INTERVENTION;  Surgeon: Serafina Mitchell, MD;  Location: [place] CV LAB;  Service: Cardiovascular;  Laterality: Left;  subclavian artery   PERIPHERAL VASCULAR INTERVENTION Left 01/19/2019   Procedure: PERIPHERAL VASCULAR INTERVENTION;  Surgeon: Serafina Mitchell, MD;  Location: [place] CV LAB;  Service: Cardiovascular;  Laterality: Left;  subclavian stent   PR VEIN BYPASS GRAFT,AORTO-FEM-POP  02/26/11   UPPER EXTREMITY ANGIOGRAPHY Left 09/16/2017   Procedure: Upper Extremity Angiography;  Surgeon: Serafina Mitchell, MD;  Location: [place] CV LAB;  Service: Cardiovascular;  Laterality: Left;    Social History   Socioeconomic History   Marital status: Unknown    Spouse name: Not on file   Number of children: 2   Years of education: Not on file   Highest education level: Not on file  Occupational History   Not on file  Tobacco Use   Smoking status: Some Days    Packs/day: 0.10    Years: 45.00    Total pack years: 4.50    Types: Cigarettes   Smokeless tobacco: Never   Tobacco comments:     pt states that she smokes about 6 cigs per day  Vaping Use   Vaping Use: Never used  Substance and Sexual Activity   Alcohol use: No    Alcohol/week: 0.0 standard drinks of alcohol   Drug use: No    Comment: History of Cocaine and marijuana abuse: "not in many years" (04/08/11   Sexual activity: Not Currently  Other Topics Concern   Not on file  Social History Narrative   Pt lives at home with her 2 grandsons. She has been separated for 16yr, has two children, and has a 12th grade education  level. She drinks 3 cups of coffee daily.   Social Determinants of Health   Financial Resource Strain: Not on file  Food Insecurity: Not on file  Transportation Needs: Not on file  Physical Activity: Not on file  Stress: Not on file  Social Connections: Not on file  Intimate Partner Violence: Not on file    Family History  Problem Relation Age of Onset   Cancer Mother        breast, colon, ovarian   Cancer Brother 59       lung   Hyperlipidemia Brother    Cancer Other    Stroke Other    Cancer Maternal Grandmother        ovarian   Anesthesia problems Neg Hx     ROS: Weight loss of approximately 30 pounds over the past year and a half but no fevers or chills, productive cough, hemoptysis, dysphasia, odynophagia, melena, hematochezia, dysuria, hematuria, rash, seizure activity, orthopnea, PND, pedal edema, claudication. Remaining systems are negative.  Physical Exam: Well-developed well-nourished in no acute distress.  Skin is warm and dry.  HEENT is normal.  Neck is supple.  Chest is clear to auscultation with normal expansion.  Cardiovascular exam is regular rate and rhythm.  Abdominal exam nontender or distended. No masses palpated. Extremities show no edema. neuro grossly intact  ECG-sinus bradycardia at a rate of 54, no ST changes.  Personally reviewed  A/P  1 coronary artery disease-continue present therapy including Plavix and statin.  Given history of residual  coronary disease and peripheral vascular disease plan has been to continue Plavix indefinitely.  2 hypertension-blood pressure controlled.   3 hyperlipidemia-continue Crestor at present dose (she was agreeable to 20 mg only in the past).  Continue Zetia and Repatha.  Check lipids and liver today.  4 Tobacco abuse-patient counseled on discontinuing.  5 Peripheral vascular disease/carotid artery disease-managed by vascular surgery.  Continue Plavix and statin.  6 weight loss-I have asked her to follow-up with primary care for this issue.  Kirk Ruths, MD

## 2021-10-04 ENCOUNTER — Encounter: Payer: Self-pay | Admitting: Cardiology

## 2021-10-04 ENCOUNTER — Ambulatory Visit: Payer: Medicare HMO | Attending: Cardiology | Admitting: Cardiology

## 2021-10-04 VITALS — BP 90/60 | HR 54 | Ht 64.0 in | Wt 108.0 lb

## 2021-10-04 DIAGNOSIS — I10 Essential (primary) hypertension: Secondary | ICD-10-CM

## 2021-10-04 DIAGNOSIS — I6523 Occlusion and stenosis of bilateral carotid arteries: Secondary | ICD-10-CM

## 2021-10-04 DIAGNOSIS — I2510 Atherosclerotic heart disease of native coronary artery without angina pectoris: Secondary | ICD-10-CM | POA: Diagnosis not present

## 2021-10-04 DIAGNOSIS — I739 Peripheral vascular disease, unspecified: Secondary | ICD-10-CM

## 2021-10-04 DIAGNOSIS — E7800 Pure hypercholesterolemia, unspecified: Secondary | ICD-10-CM | POA: Diagnosis not present

## 2021-10-04 LAB — LIPID PANEL
Chol/HDL Ratio: 1.6 ratio (ref 0.0–4.4)
Cholesterol, Total: 95 mg/dL — ABNORMAL LOW (ref 100–199)
HDL: 58 mg/dL (ref >39)
LDL Chol Calc (NIH): 23 mg/dL (ref 0–99)
Triglycerides: 61 mg/dL (ref 0–149)
VLDL Cholesterol Cal: 14 mg/dL (ref 5–40)

## 2021-10-04 LAB — HEPATIC FUNCTION PANEL
ALT: 40 IU/L — ABNORMAL HIGH (ref 0–32)
AST: 32 IU/L (ref 0–40)
Albumin: 4.3 g/dL (ref 3.9–4.9)
Alkaline Phosphatase: 68 IU/L (ref 44–121)
Bilirubin Total: 0.6 mg/dL (ref 0.0–1.2)
Bilirubin, Direct: 0.25 mg/dL (ref 0.00–0.40)
Total Protein: 6.2 g/dL (ref 6.0–8.5)

## 2021-10-04 NOTE — Patient Instructions (Signed)
  Follow-Up: At [facility], you and your health needs are our priority.  As part of our continuing mission to provide you with exceptional heart care, we have created designated Provider Care Teams.  These Care Teams include your primary Cardiologist (physician) and Advanced Practice Providers (APPs -  Physician Assistants and Nurse Practitioners) who all work together to provide you with the care you need, when you need it.  We recommend signing up for the patient portal called "MyChart".  Sign up information is provided on this After Visit Summary.  MyChart is used to connect with patients for Virtual Visits (Telemedicine).  Patients are able to view lab/test results, encounter notes, upcoming appointments, etc.  Non-urgent messages can be sent to your provider as well.   To learn more about what you can do with MyChart, go to [email or web address].    Your next appointment:   12 month(s)  The format for your next appointment:   In Person  Provider:   Brian Crenshaw, MD   

## 2021-10-25 ENCOUNTER — Other Ambulatory Visit: Payer: Self-pay | Admitting: Cardiology

## 2021-10-25 DIAGNOSIS — E785 Hyperlipidemia, unspecified: Secondary | ICD-10-CM

## 2021-11-08 DIAGNOSIS — D692 Other nonthrombocytopenic purpura: Secondary | ICD-10-CM | POA: Diagnosis not present

## 2021-11-08 DIAGNOSIS — F17200 Nicotine dependence, unspecified, uncomplicated: Secondary | ICD-10-CM | POA: Diagnosis not present

## 2021-11-08 DIAGNOSIS — I25118 Atherosclerotic heart disease of native coronary artery with other forms of angina pectoris: Secondary | ICD-10-CM | POA: Diagnosis not present

## 2021-11-08 DIAGNOSIS — E785 Hyperlipidemia, unspecified: Secondary | ICD-10-CM | POA: Diagnosis not present

## 2021-11-08 DIAGNOSIS — E46 Unspecified protein-calorie malnutrition: Secondary | ICD-10-CM | POA: Diagnosis not present

## 2021-11-08 DIAGNOSIS — Z8541 Personal history of malignant neoplasm of cervix uteri: Secondary | ICD-10-CM | POA: Diagnosis not present

## 2021-11-08 DIAGNOSIS — I10 Essential (primary) hypertension: Secondary | ICD-10-CM | POA: Diagnosis not present

## 2021-11-08 DIAGNOSIS — J449 Chronic obstructive pulmonary disease, unspecified: Secondary | ICD-10-CM | POA: Diagnosis not present

## 2021-11-08 DIAGNOSIS — I739 Peripheral vascular disease, unspecified: Secondary | ICD-10-CM | POA: Diagnosis not present

## 2021-11-12 ENCOUNTER — Other Ambulatory Visit: Payer: Self-pay | Admitting: Cardiology

## 2021-11-12 DIAGNOSIS — E785 Hyperlipidemia, unspecified: Secondary | ICD-10-CM

## 2021-11-14 DIAGNOSIS — N9411 Superficial (introital) dyspareunia: Secondary | ICD-10-CM | POA: Diagnosis not present

## 2021-11-14 DIAGNOSIS — Z01411 Encounter for gynecological examination (general) (routine) with abnormal findings: Secondary | ICD-10-CM | POA: Diagnosis not present

## 2021-11-14 DIAGNOSIS — Z124 Encounter for screening for malignant neoplasm of cervix: Secondary | ICD-10-CM | POA: Diagnosis not present

## 2021-11-14 DIAGNOSIS — N941 Unspecified dyspareunia: Secondary | ICD-10-CM | POA: Diagnosis not present

## 2021-11-14 DIAGNOSIS — Z01419 Encounter for gynecological examination (general) (routine) without abnormal findings: Secondary | ICD-10-CM | POA: Diagnosis not present

## 2021-11-14 DIAGNOSIS — Z681 Body mass index (BMI) 19 or less, adult: Secondary | ICD-10-CM | POA: Diagnosis not present

## 2021-11-14 DIAGNOSIS — N952 Postmenopausal atrophic vaginitis: Secondary | ICD-10-CM | POA: Diagnosis not present

## 2021-12-24 DIAGNOSIS — N760 Acute vaginitis: Secondary | ICD-10-CM | POA: Diagnosis not present

## 2021-12-24 DIAGNOSIS — N952 Postmenopausal atrophic vaginitis: Secondary | ICD-10-CM | POA: Diagnosis not present

## 2021-12-24 DIAGNOSIS — N9411 Superficial (introital) dyspareunia: Secondary | ICD-10-CM | POA: Diagnosis not present

## 2021-12-24 DIAGNOSIS — N941 Unspecified dyspareunia: Secondary | ICD-10-CM | POA: Diagnosis not present

## 2022-01-17 ENCOUNTER — Other Ambulatory Visit: Payer: Self-pay | Admitting: Cardiology

## 2022-01-25 ENCOUNTER — Other Ambulatory Visit (HOSPITAL_COMMUNITY): Payer: Self-pay

## 2022-03-05 ENCOUNTER — Other Ambulatory Visit: Payer: Self-pay | Admitting: Internal Medicine

## 2022-03-05 DIAGNOSIS — R002 Palpitations: Secondary | ICD-10-CM | POA: Diagnosis not present

## 2022-03-05 DIAGNOSIS — F17200 Nicotine dependence, unspecified, uncomplicated: Secondary | ICD-10-CM | POA: Diagnosis not present

## 2022-03-05 DIAGNOSIS — I25118 Atherosclerotic heart disease of native coronary artery with other forms of angina pectoris: Secondary | ICD-10-CM | POA: Diagnosis not present

## 2022-03-05 DIAGNOSIS — S239XXA Sprain of unspecified parts of thorax, initial encounter: Secondary | ICD-10-CM | POA: Diagnosis not present

## 2022-03-05 DIAGNOSIS — E785 Hyperlipidemia, unspecified: Secondary | ICD-10-CM | POA: Diagnosis not present

## 2022-03-05 DIAGNOSIS — J449 Chronic obstructive pulmonary disease, unspecified: Secondary | ICD-10-CM | POA: Diagnosis not present

## 2022-03-05 DIAGNOSIS — I10 Essential (primary) hypertension: Secondary | ICD-10-CM | POA: Diagnosis not present

## 2022-03-05 DIAGNOSIS — I739 Peripheral vascular disease, unspecified: Secondary | ICD-10-CM | POA: Diagnosis not present

## 2022-04-11 ENCOUNTER — Other Ambulatory Visit: Payer: Self-pay | Admitting: *Deleted

## 2022-04-11 DIAGNOSIS — I779 Disorder of arteries and arterioles, unspecified: Secondary | ICD-10-CM

## 2022-04-11 DIAGNOSIS — I6523 Occlusion and stenosis of bilateral carotid arteries: Secondary | ICD-10-CM

## 2022-04-18 DIAGNOSIS — E46 Unspecified protein-calorie malnutrition: Secondary | ICD-10-CM | POA: Diagnosis not present

## 2022-04-18 DIAGNOSIS — K649 Unspecified hemorrhoids: Secondary | ICD-10-CM | POA: Diagnosis not present

## 2022-04-18 DIAGNOSIS — J449 Chronic obstructive pulmonary disease, unspecified: Secondary | ICD-10-CM | POA: Diagnosis not present

## 2022-04-18 DIAGNOSIS — I10 Essential (primary) hypertension: Secondary | ICD-10-CM | POA: Diagnosis not present

## 2022-04-18 DIAGNOSIS — R197 Diarrhea, unspecified: Secondary | ICD-10-CM | POA: Diagnosis not present

## 2022-04-19 DIAGNOSIS — R197 Diarrhea, unspecified: Secondary | ICD-10-CM | POA: Diagnosis not present

## 2022-04-22 ENCOUNTER — Ambulatory Visit (HOSPITAL_COMMUNITY)
Admission: RE | Admit: 2022-04-22 | Discharge: 2022-04-22 | Disposition: A | Discharge status: Home / Self Care | Payer: Medicare HMO | Source: Ambulatory Visit | Attending: Surgery | Admitting: Surgery

## 2022-04-22 ENCOUNTER — Ambulatory Visit: Payer: Medicare HMO | Admitting: Surgery

## 2022-04-22 ENCOUNTER — Ambulatory Visit (INDEPENDENT_AMBULATORY_CARE_PROVIDER_SITE_OTHER)
Admission: RE | Admit: 2022-04-22 | Discharge: 2022-04-22 | Disposition: A | Discharge status: Home / Self Care | Payer: Medicare HMO | Source: Ambulatory Visit | Attending: Surgery | Admitting: Surgery

## 2022-04-22 ENCOUNTER — Encounter: Payer: Self-pay | Admitting: Surgery

## 2022-04-22 VITALS — BP 111/64 | HR 56 | Temp 98.1°F | Resp 20 | Ht 64.0 in | Wt 108.0 lb

## 2022-04-22 DIAGNOSIS — I70213 Atherosclerosis of native arteries of extremities with intermittent claudication, bilateral legs: Secondary | ICD-10-CM | POA: Diagnosis not present

## 2022-04-22 DIAGNOSIS — I6523 Occlusion and stenosis of bilateral carotid arteries: Secondary | ICD-10-CM | POA: Diagnosis not present

## 2022-04-22 DIAGNOSIS — I779 Disorder of arteries and arterioles, unspecified: Secondary | ICD-10-CM | POA: Insufficient documentation

## 2022-04-22 LAB — VAS US ABI WITH/WO TBI
Left ABI: 0.66
Right ABI: 0.94

## 2022-04-22 NOTE — Progress Notes (Signed)
[facility] [place]  Patient name: Kimberly [name] MRN: [id number] DOB: [birth year] Sex: female   REASON FOR VISIT:    Follow up  HISOTRY OF PRESENT ILLNESS:   Kimberly [name] is a 72 y.o. female who returns today for follow-up.  On 09/16/2017 she underwent stenting of her left subclavian artery and angioplasty of her left brachial artery.  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  She remained asymptomatic and was discharged home.   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  She went back for lysis of adhesions secondary to abdominal pain in April.  In June 2013 she underwent left carotid endarterectomy for a high-grade asymptomatic stenosis.  She then later presented with dizziness and temporary vision loss.  On 06/24/2012 she had stenting of her left subclavian artery.  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  On 08/13/2013 she underwent right carotid endarterectomy for an asymptomatic right carotid stenosis   She went for angiography because of a left subclavian in-stent stenosis on 04/27/2015.  She was found to have a high-grade stenosis and this was stented.  On 04/30/2015 she presented with acute ischemia to the left hand.  She was taken to the operating room by Dr. fields who performed a thrombectomy. On 12/05/2015 she underwent drug coated balloon angioplasty of a left subclavian in-stent stenosis.  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      The patient is a smoker.  She is medically managed for hypertension which is been under good control.  She is not on cholesterol medicine for hypercholesterolemia.  She underwent cardiac catheterization and PCI in November 2021.   PAST MEDICAL HISTORY:   Past  Medical History:  Diagnosis Date   Anemia    Angina    March 2013   Anxiety    Asthma    Blood transfusion    CAD (coronary artery disease)    11/22/19- PCI ostial RCA, mRCA   Depression    Fibromyalgia    History of lead poisoning 1977   Hyperlipidemia    Hypertension    Migraine    Peripheral vascular disease    claudication  right greater than left  femoral artery   Small bowel obstruction, partial 04/08/11   Stroke    Subclavian artery disease    left   Uterine cancer    Pre cervical     FAMILY HISTORY:   Family History  Problem Relation Age of Onset   Cancer Mother        breast, colon, ovarian   Cancer Brother 50       lung   Hyperlipidemia Brother    Cancer Other    Stroke Other    Cancer Maternal Grandmother        ovarian   Anesthesia problems Neg Hx     SOCIAL HISTORY:   Social History   Tobacco Use   Smoking status: Every Day    Packs/day: 0.75    Years: 45.00    Additional pack years: 0.00    Total pack years: 33.75    Types: Cigarettes   Smokeless tobacco: Never  Substance Use Topics   Alcohol use: No    Alcohol/week: 0.0 standard drinks of alcohol     ALLERGIES:   Allergies  Allergen Reactions  Codeine Nausea And Vomiting and Nausea Only    Dizziness, headache, has passed out Dizzy and sick   Hydrocodone Nausea And Vomiting and Other (See Comments)    Headache and dizziness   Other Shortness Of Breath    Allergy to cologne/perfume base   Oxycodone Nausea And Vomiting and Other (See Comments)    Dizziness    Atorvastatin Diarrhea and Nausea And Vomiting    Causes GI symptoms if she takes it every day.   Crestor [Rosuvastatin]     Dizzy and nauseous     CURRENT MEDICATIONS:   Current Outpatient Medications  Medication Sig Dispense Refill   acetaminophen (TYLENOL) 650 MG CR tablet Take 1,300 mg by mouth every 8 (eight) hours as needed for pain.     albuterol (PROVENTIL HFA;VENTOLIN HFA) 108 (90 Base) MCG/ACT inhaler  Inhale 1-2 puffs into the lungs every 6 (six) hours as needed for wheezing or shortness of breath. 1 Inhaler 0   Aromatic Inhalants (VICKS VAPOINHALER IN) Inhale 1 puff into the lungs daily as needed (congestion).     Ascorbic Acid (VITAMIN C) 1000 MG tablet Take 1,000 mg by mouth daily.     ASPERCREME LIDOCAINE EX Apply 1 application topically daily as needed (back pain).     aspirin EC 81 MG EC tablet Take 1 tablet (81 mg total) by mouth daily. Swallow whole. 30 tablet 11   cetirizine (ZYRTEC ALLERGY) 10 MG tablet Take 1 tablet (10 mg total) by mouth daily. 30 tablet 0   Cholecalciferol (VITAMIN D3) 50 MCG (2000 UT) TABS Take 2,000 Units by mouth daily.      clopidogrel (PLAVIX) 75 MG tablet TAKE 1 TABLET AT BEDTIME 90 tablet 1   Cyanocobalamin (VITAMIN B-12) 5000 MCG TBDP Take 5,000 mcg by mouth daily.      diphenhydrAMINE-zinc acetate (BENADRYL) cream Apply 1 application topically daily as needed for itching.     doxycycline (VIBRAMYCIN) 100 MG capsule Take 100 mg by mouth 2 (two) times daily.     Evolocumab (REPATHA SURECLICK) 140 MG/ML SOAJ Inject 140 mg into the skin every 14 (fourteen) days. 2 mL 11   ezetimibe (ZETIA) 10 MG tablet TAKE 1 TABLET EVERY DAY 60 tablet 10   ferrous sulfate 325 (65 FE) MG tablet Take 325 mg by mouth every Monday, Wednesday, and Friday.      fluticasone (FLONASE) 50 MCG/ACT nasal spray Place 1 spray into both nostrils daily. 11.1 mL 1   irbesartan (AVAPRO) 150 MG tablet Take 1 tablet (150 mg total) by mouth daily. 90 tablet 2   loratadine (CLARITIN) 10 MG tablet Take 10 mg by mouth daily as needed for allergies.     nitroGLYCERIN (NITROSTAT) 0.4 MG SL tablet DISSOLVE 1 TAB UNDER TONGUE FOR CHEST PAIN - IF PAIN REMAINS AFTER 5 MIN, CALL 911 AND REPEAT DOSE. MAX 3 TABS IN 15 MINUTES 25 tablet 1   OVER THE COUNTER MEDICATION Take 1 tablet by mouth every other day. MK-7  (vit K + magnesium)     rosuvastatin (CRESTOR) 40 MG tablet TAKE ONE TABLET BY MOUTH DAILY 90  tablet 3   No current facility-administered medications for this visit.    REVIEW OF SYSTEMS:    denotes positive finding,  denotes negative finding Cardiac  Comments:  Chest pain or chest pressure:    Shortness of breath upon exertion:    Short of breath when lying flat:    Irregular heart rhythm:  Vascular    Pain in calf, thigh, or hip brought on by ambulation:    Pain in feet at night that wakes you up from your sleep:     Blood clot in your veins:    Leg swelling:         Pulmonary    Oxygen at home:    Productive cough:     Wheezing:         Neurologic    Sudden weakness in arms or legs:     Sudden numbness in arms or legs:     Sudden onset of difficulty speaking or slurred speech:    Temporary loss of vision in one eye:     Problems with dizziness:         Gastrointestinal    Blood in stool:     Vomited blood:         Genitourinary    Burning when urinating:     Blood in urine:        Psychiatric    Major depression:         Hematologic    Bleeding problems:    Problems with blood clotting too easily:        Skin    Rashes or ulcers:        Constitutional    Fever or chills:      PHYSICAL EXAM:   Vitals:   04/22/22 0838 04/22/22 0840  BP: 106/62 111/64  Pulse: (!) 56   Resp: 20   Temp: 98.1 F (36.7 C)   SpO2: 98%   Weight: 108 lb (49 kg)   Height: 5\' 4"  (1.626 m)     GENERAL: The patient is a well-nourished female, in no acute distress. The vital signs are documented above. CARDIAC: There is a regular rate and rhythm.  VASCULAR: Palpable dorsalis pedis and bilateral radial pulses PULMONARY: Non-labored respirations ABDOMEN: Soft and non-tender with normal pitched bowel sounds.  MUSCULOSKELETAL: There are no major deformities or cyanosis. NEUROLOGIC: No focal weakness or paresthesias are detected. SKIN: There are no ulcers or rashes noted. PSYCHIATRIC: The patient has a normal affect.  STUDIES:   I have reviewed the  following: +-------+-----------+-----------+------------+------------+  ABI/TBIToday's ABIToday's TBIPrevious ABIPrevious TBI  +-------+-----------+-----------+------------+------------+  Right 0.94       0.47       1.10        0.68          +-------+-----------+-----------+------------+------------+  Left  0.66       0.48       0.82        0.56          +-------+-----------+-----------+------------+------------+  Right toe pressure:  65 Left toe pressure:  66  Carotid: Right Carotid: Velocities in the right ICA are consistent with a 60-79%                 stenosis. The ECA appears >50% stenosed.   Left Carotid: Velocities in the left ICA are consistent with a 1-39%  stenosis.               The ECA appears >50% stenosed.   Vertebrals: Bilateral vertebral arteries demonstrate antegrade flow.    MEDICAL ISSUES:   Carotid: She is asymptomatic.  There has been progression of her right-sided stenosis which is now in the 60 to 79% category.  She will follow-up in 6 months with repeat ultrasound  PAD: She denies any symptoms of claudication.  Despite her abnormal ABIs,  she has palpable pedal pulses.  I will follow-up her lower extremity vascular evaluation in 1 year with ABIs    Charlena Cross, MD, FACS [facility] [phone] Pager [phone]

## 2022-04-30 ENCOUNTER — Other Ambulatory Visit: Payer: Self-pay

## 2022-04-30 DIAGNOSIS — I6523 Occlusion and stenosis of bilateral carotid arteries: Secondary | ICD-10-CM

## 2022-05-13 ENCOUNTER — Other Ambulatory Visit: Payer: Self-pay | Admitting: Cardiology

## 2022-05-22 ENCOUNTER — Other Ambulatory Visit: Payer: Self-pay | Admitting: Internal Medicine

## 2022-05-22 ENCOUNTER — Telehealth: Payer: Self-pay | Admitting: *Deleted

## 2022-05-22 ENCOUNTER — Telehealth: Payer: Self-pay

## 2022-05-22 DIAGNOSIS — R634 Abnormal weight loss: Secondary | ICD-10-CM

## 2022-05-22 DIAGNOSIS — R6881 Early satiety: Secondary | ICD-10-CM

## 2022-05-22 NOTE — Telephone Encounter (Signed)
   Pre-operative Risk Assessment    Patient Name: Kimberly [name]  DOB: Jun 23, 1950 MRN: [id number]      Request for Surgical Clearance    Procedure:   Endoscopy/Colonoscopy  Date of Surgery:  Clearance TBD                                 Surgeon:  Dr. Liliane Shi Surgeon's Group or Practice Name:  [facility] Phone number:  [phone] Fax number:  [phone]   Type of Clearance Requested:   - Medical  - Pharmacy:  Hold Aspirin and Clopidogrel (Plavix) Not Indicated   Type of Anesthesia:   Propofol   Additional requests/questions:    Signed, Emmit Pomfret   05/22/2022, 10:33 AM

## 2022-05-22 NOTE — Telephone Encounter (Signed)
Patient is returning call. Transferred to [place], [place]

## 2022-05-22 NOTE — Telephone Encounter (Signed)
Lvm for pt to call back office to set up tele clearance.

## 2022-05-22 NOTE — Telephone Encounter (Signed)
Spoke with patient who is agreeable to do a tele visit on 6/4 at 9 am. Med rec and consent done.

## 2022-05-22 NOTE — Telephone Encounter (Signed)
Primary Cardiologist:Brian Jens Som, MD   Preoperative team, please contact this patient and set up a phone call appointment for further preoperative risk assessment. Please obtain consent and complete medication review. Thank you for your help.   If requested per office protocol and pending no symptoms of ACS at time of the appointment, he may hold Plavix  for 5 days prior to procedure and should resume as soon as hemodynamically stable postoperatively. ideally aspirin should be continued without interruption, however if the bleeding risk is too great, aspirin may be held for 5-7 days prior to surgery. Please resume aspirin post operatively when it is felt to be safe from a bleeding standpoint.     Levi Aland, NP-C  05/22/2022, 1:30 PM [address] Office [phone] Fax [phone]

## 2022-05-22 NOTE — Telephone Encounter (Signed)
  Patient Consent for Virtual Visit        Kimberly [name] has provided verbal consent on 05/22/2022 for a virtual visit (video or telephone).   CONSENT FOR VIRTUAL VISIT FOR:  Kimberly [name]  By participating in this virtual visit I agree to the following:  I hereby voluntarily request, consent and authorize [facility] and its employed or contracted physicians, physician assistants, nurse practitioners or other licensed health care professionals (the Practitioner), to provide me with telemedicine health care services (the "Services") as deemed necessary by the treating Practitioner. I acknowledge and consent to receive the Services by the Practitioner via telemedicine. I understand that the telemedicine visit will involve communicating with the Practitioner through live audiovisual communication technology and the disclosure of certain medical information by electronic transmission. I acknowledge that I have been given the opportunity to request an in-person assessment or other available alternative prior to the telemedicine visit and am voluntarily participating in the telemedicine visit.  I understand that I have the right to withhold or withdraw my consent to the use of telemedicine in the course of my care at any time, without affecting my right to future care or treatment, and that the Practitioner or I may terminate the telemedicine visit at any time. I understand that I have the right to inspect all information obtained and/or recorded in the course of the telemedicine visit and may receive copies of available information for a reasonable fee.  I understand that some of the potential risks of receiving the Services via telemedicine include:  Delay or interruption in medical evaluation due to technological equipment failure or disruption; Information transmitted may not be sufficient (e.g. poor resolution of images) to allow for appropriate medical decision making by the  Practitioner; and/or  In rare instances, security protocols could fail, causing a breach of personal health information.  Furthermore, I acknowledge that it is my responsibility to provide information about my medical history, conditions and care that is complete and accurate to the best of my ability. I acknowledge that Practitioner's advice, recommendations, and/or decision may be based on factors not within their control, such as incomplete or inaccurate data provided by me or distortions of diagnostic images or specimens that may result from electronic transmissions. I understand that the practice of medicine is not an exact science and that Practitioner makes no warranties or guarantees regarding treatment outcomes. I acknowledge that a copy of this consent can be made available to me via my patient portal [facility] MyChart), or I can request a printed copy by calling the office of [facility].    I understand that my insurance will be billed for this visit.   I have read or had this consent read to me. I understand the contents of this consent, which adequately explains the benefits and risks of the Services being provided via telemedicine.  I have been provided ample opportunity to ask questions regarding this consent and the Services and have had my questions answered to my satisfaction. I give my informed consent for the services to be provided through the use of telemedicine in my medical care

## 2022-05-25 ENCOUNTER — Other Ambulatory Visit: Payer: Self-pay | Admitting: Vascular Surgery

## 2022-06-10 ENCOUNTER — Other Ambulatory Visit: Payer: Self-pay | Admitting: Internal Medicine

## 2022-06-11 ENCOUNTER — Ambulatory Visit: Payer: Medicare HMO | Attending: Cardiology | Admitting: Student

## 2022-06-11 DIAGNOSIS — Z01810 Encounter for preprocedural cardiovascular examination: Secondary | ICD-10-CM | POA: Diagnosis not present

## 2022-06-11 NOTE — Progress Notes (Signed)
Virtual Visit via Telephone Note   Because of Kimberly [name]'s co-morbid illnesses, she is at least at moderate risk for complications without adequate follow up.  This format is felt to be most appropriate for this patient at this time.  The patient did not have access to video technology/had technical difficulties with video requiring transitioning to audio format only (telephone).  All issues noted in this document were discussed and addressed.  No physical exam could be performed with this format.  Please refer to the patient's chart for her consent to telehealth for [facility].  Evaluation Performed:  Preoperative cardiovascular risk assessment _____________   Date:  06/11/2022   Patient ID:  Kimberly [name], DOB 12-Nov-1950, MRN [id number] Patient Location:  Home Provider location:   Office  Primary Care Provider:  Charlane Ferretti, DO Primary Cardiologist:  Olga Millers, MD  Chief Complaint / Patient Profile   72 y.o. y/o female with a h/o CAD s/p PCI with DES x 2 to RCA November 2021; PVD/carotid artery disease s/p left subclavian artery stent and left brachial artery angioplasty, right brachial artery embolectomy, left carotid endarterectomy, aortobifemoral bypass graft; hypertension; hyperlipidemia; tobacco abuse who is pending endoscopy/colonoscopy by Dr. Lorenso Quarry and presents today for telephonic preoperative cardiovascular risk assessment.  History of Present Illness    Kimberly [name] is a 72 y.o. female who presents via audio/video conferencing for a telehealth visit today.  Pt was last seen in cardiology clinic on 10/04/2021 by Dr. Jens Som.  At that time Kimberly [name] was doing well.  The patient is now pending procedure as outlined above. Since her last visit, she is doing well from a cardiac standpoint. Patient denies shortness of breath or dyspnea on exertion. No chest pain, pressure, or tightness. Denies lower extremity edema, orthopnea, or PND. No  palpitations.  She does not do formal exercise but she stays active around her house doing yard work and performing household chores.   Past Medical History    Past Medical History:  Diagnosis Date   Anemia    Angina    March 2013   Anxiety    Asthma    Blood transfusion    CAD (coronary artery disease)    11/22/19- PCI ostial RCA, mRCA   Depression    Fibromyalgia    History of lead poisoning 1977   Hyperlipidemia    Hypertension    Migraine    Peripheral vascular disease (HCC)    claudication  right greater than left  femoral artery   Small bowel obstruction, partial (HCC) 04/08/11   Stroke (HCC)    Subclavian artery disease (HCC)    left   Uterine cancer (HCC)    Pre cervical   Past Surgical History:  Procedure Laterality Date   abdominal angiogram     ABDOMINAL ANGIOGRAM N/A 02/05/2011   Procedure: ABDOMINAL ANGIOGRAM;  Surgeon: Nada Libman, MD;  Location: [facility] CATH LAB;  Service: Cardiovascular;  Laterality: N/A;   ABDOMINAL HYSTERECTOMY  2008   AORTA - BILATERAL FEMORAL ARTERY BYPASS GRAFT  02/26/2011   Procedure: AORTA BIFEMORAL BYPASS GRAFT;  Surgeon: Juleen China, MD;  Location: [facility] OR;  Service: Vascular;  Laterality: N/A;   AORTIC ARCH ANGIOGRAPHY N/A 09/16/2017   Procedure: AORTIC ARCH ANGIOGRAPHY;  Surgeon: Nada Libman, MD;  Location: [facility] INVASIVE CV LAB;  Service: Cardiovascular;  Laterality: N/A;   AORTIC ARCH ANGIOGRAPHY N/A 01/19/2019   Procedure: AORTIC ARCH ANGIOGRAPHY;  Surgeon: Nada Libman, MD;  Location: [facility] INVASIVE CV LAB;  Service: Cardiovascular;  Laterality: N/A;  Left upper extremity angiography   ARCH AORTOGRAM N/A 06/24/2012   Procedure: ARCH AORTOGRAM;  Surgeon: Nada Libman, MD;  Location: [facility] CATH LAB;  Service: Cardiovascular;  Laterality: N/A;   BILATERAL UPPER EXTREMITY ANGIOGRAM Left 08/12/2013   Procedure: UPPER EXTREMITY ANGIOGRAM;  Surgeon: Nada Libman, MD;  Location: [facility] CATH LAB;  Service: Cardiovascular;  Laterality: Left;    CAROTID ENDARTERECTOMY Left 06/13/2011   Left  CEA   CAROTID ENDARTERECTOMY Right 08-13-13   CEA   CHOLECYSTECTOMY N/A 12/08/2012   Procedure: LAPAROSCOPIC CHOLECYSTECTOMY WITH INTRAOPERATIVE CHOLANGIOGRAM;  Surgeon: Currie Paris, MD;  Location: [facility] OR;  Service: General;  Laterality: N/A;   COLONOSCOPY WITH PROPOFOL N/A 06/16/2014   Procedure: COLONOSCOPY WITH PROPOFOL;  Surgeon: Charolett Bumpers, MD;  Location: [facility] ENDOSCOPY;  Service: Endoscopy;  Laterality: N/A;   CORONARY STENT INTERVENTION N/A 11/22/2019   Procedure: CORONARY STENT INTERVENTION;  Surgeon: Yvonne Kendall, MD;  Location: [facility] INVASIVE CV LAB;  Service: Cardiovascular;  Laterality: N/A;   EMBOLECTOMY Left 04/30/2015   Procedure: EMBOLECTOMY BRACHIAL, Radial and ulnar arteries with patch angioplasty of brachial artery.;  Surgeon: Sherren Kerns, MD;  Location: [facility] OR;  Service: Vascular;  Laterality: Left;   ENDARTERECTOMY  06/13/2011   Procedure: ENDARTERECTOMY CAROTID;  Surgeon: Nada Libman, MD;  Location: [facility] OR;  Service: Vascular;  Laterality: Left;  Left carotid artery endarterectomy with vascu-guard patch angioplasty   ENDARTERECTOMY Right 08/13/2013   Procedure: RIGHT  CAROTID ENDARTERECTOMY CAROTID WITH BOVINE PERICARDIAL PATCH ANGIOPLASTY;  Surgeon: Nada Libman, MD;  Location: [facility] OR;  Service: Vascular;  Laterality: Right;   ESOPHAGOGASTRODUODENOSCOPY (EGD) WITH PROPOFOL N/A 06/16/2014   Procedure: ESOPHAGOGASTRODUODENOSCOPY (EGD) WITH PROPOFOL;  Surgeon: Charolett Bumpers, MD;  Location: [facility] ENDOSCOPY;  Service: Endoscopy;  Laterality: N/A;   LAPAROTOMY  04/10/2011   Procedure: EXPLORATORY LAPAROTOMY;  Surgeon: Kandis Cocking, MD;  Location: [facility] OR;  Service: General;  Laterality: N/A;  ENTEROLYSIS OF ADHESIONS   LEFT HEART CATH AND CORONARY ANGIOGRAPHY N/A 11/19/2019   Procedure: LEFT HEART CATH AND CORONARY ANGIOGRAPHY;  Surgeon: Iran Ouch, MD;  Location: [facility] INVASIVE CV LAB;  Service: Cardiovascular;   Laterality: N/A;   LOWER EXTREMITY ANGIOGRAM Left 06/24/12   Left stent intervention, left subclavian   PERIPHERAL VASCULAR BALLOON ANGIOPLASTY Left 09/16/2017   Procedure: PERIPHERAL VASCULAR BALLOON ANGIOPLASTY;  Surgeon: Nada Libman, MD;  Location: [facility] INVASIVE CV LAB;  Service: Cardiovascular;  Laterality: Left;  brachial artery   PERIPHERAL VASCULAR BALLOON ANGIOPLASTY Left 01/19/2019   Procedure: PERIPHERAL VASCULAR BALLOON ANGIOPLASTY;  Surgeon: Nada Libman, MD;  Location: [facility] INVASIVE CV LAB;  Service: Cardiovascular;  Laterality: Left;  brachial artery   PERIPHERAL VASCULAR CATHETERIZATION N/A 04/26/2015   Procedure: Aortic Arch Angiography;  Surgeon: Nada Libman, MD;  Location: [facility] INVASIVE CV LAB;  Service: Cardiovascular;  Laterality: N/A;   PERIPHERAL VASCULAR CATHETERIZATION  04/26/2015   Procedure: Peripheral Vascular Intervention;  Surgeon: Nada Libman, MD;  Location: [facility] INVASIVE CV LAB;  Service: Cardiovascular;;  lt illiac   PERIPHERAL VASCULAR CATHETERIZATION N/A 12/05/2015   Procedure: Aortic Arch Angiography;  Surgeon: Nada Libman, MD;  Location: [facility] INVASIVE CV LAB;  Service: Cardiovascular;  Laterality: N/A;   PERIPHERAL VASCULAR CATHETERIZATION Left 12/05/2015   Procedure: Peripheral Vascular Intervention;  Surgeon: Nada Libman, MD;  Location: [facility] INVASIVE CV LAB;  Service: Cardiovascular;  Laterality: Left;  subclavian  PERIPHERAL VASCULAR INTERVENTION Left 09/16/2017   Procedure: PERIPHERAL VASCULAR INTERVENTION;  Surgeon: Nada Libman, MD;  Location: [facility] INVASIVE CV LAB;  Service: Cardiovascular;  Laterality: Left;  subclavian artery   PERIPHERAL VASCULAR INTERVENTION Left 01/19/2019   Procedure: PERIPHERAL VASCULAR INTERVENTION;  Surgeon: Nada Libman, MD;  Location: [facility] INVASIVE CV LAB;  Service: Cardiovascular;  Laterality: Left;  subclavian stent   PR VEIN BYPASS GRAFT,AORTO-FEM-POP  02/26/11   UPPER EXTREMITY ANGIOGRAPHY Left 09/16/2017    Procedure: Upper Extremity Angiography;  Surgeon: Nada Libman, MD;  Location: [facility] INVASIVE CV LAB;  Service: Cardiovascular;  Laterality: Left;    Allergies  Allergies  Allergen Reactions   Codeine Nausea And Vomiting and Nausea Only    Dizziness, headache, has passed out Dizzy and sick   Hydrocodone Nausea And Vomiting and Other (See Comments)    Headache and dizziness   Other Shortness Of Breath    Allergy to cologne/perfume base   Oxycodone Nausea And Vomiting and Other (See Comments)    Dizziness    Atorvastatin Diarrhea and Nausea And Vomiting    Causes GI symptoms if she takes it every day.   Crestor [Rosuvastatin]     Dizzy and nauseous    Home Medications    Prior to Admission medications   Medication Sig Start Date End Date Taking? Authorizing Provider  acetaminophen (TYLENOL) 650 MG CR tablet Take 1,300 mg by mouth every 8 (eight) hours as needed for pain.    [provider]  albuterol (PROVENTIL HFA;VENTOLIN HFA) 108 (90 Base) MCG/ACT inhaler Inhale 1-2 puffs into the lungs every 6 (six) hours as needed for wheezing or shortness of breath. 02/26/18   Mardella Layman, MD  Aromatic Inhalants (VICKS VAPOINHALER IN) Inhale 1 puff into the lungs daily as needed (congestion).    [provider]  Ascorbic Acid (VITAMIN C) 1000 MG tablet Take 1,000 mg by mouth daily. Patient not taking: Reported on 05/22/2022    [provider]  ASPERCREME LIDOCAINE EX Apply 1 application topically daily as needed (back pain).    [provider]  aspirin EC 81 MG EC tablet Take 1 tablet (81 mg total) by mouth daily. Swallow whole. Patient not taking: Reported on 05/22/2022 11/23/19   Arty Baumgartner, NP  cetirizine (ZYRTEC ALLERGY) 10 MG tablet Take 1 tablet (10 mg total) by mouth daily. Patient not taking: Reported on 05/22/2022 01/18/21   Valinda Hoar, NP  Cholecalciferol (VITAMIN D3) 50 MCG (2000 UT) TABS Take 2,000 Units by mouth daily.     [provider]  clopidogrel (PLAVIX) 75 MG tablet TAKE 1 TABLET AT BEDTIME 05/27/22   Nada Libman, MD  Cyanocobalamin (VITAMIN B-12) 5000 MCG TBDP Take 5,000 mcg by mouth daily.  Patient not taking: Reported on 05/22/2022    [provider]  diphenhydrAMINE-zinc acetate (BENADRYL) cream Apply 1 application topically daily as needed for itching. Patient not taking: Reported on 05/22/2022    [provider]  doxycycline (VIBRAMYCIN) 100 MG capsule Take 100 mg by mouth 2 (two) times daily. 05/04/21   [provider]  ezetimibe (ZETIA) 10 MG tablet TAKE 1 TABLET EVERY DAY 10/26/21   Lewayne Bunting, MD  ferrous sulfate 325 (65 FE) MG tablet Take 325 mg by mouth every Monday, Wednesday, and Friday.  Patient not taking: Reported on 05/22/2022    [provider]  fluticasone (FLONASE) 50 MCG/ACT nasal spray Place 1 spray into both nostrils daily. Patient not taking:  Reported on 05/22/2022 01/18/21   Valinda Hoar, NP  irbesartan (AVAPRO) 150 MG tablet Take 1 tablet (150 mg total) by mouth daily. 01/18/22   Lewayne Bunting, MD  loratadine (CLARITIN) 10 MG tablet Take 10 mg by mouth daily as needed for allergies.    [provider]  nitroGLYCERIN (NITROSTAT) 0.4 MG SL tablet DISSOLVE 1 TAB UNDER TONGUE FOR CHEST PAIN - IF PAIN REMAINS AFTER 5 MIN, CALL 911 AND REPEAT DOSE. MAX 3 TABS IN 15 MINUTES 02/01/21   Laverda Page B, NP  OVER THE COUNTER MEDICATION Take 1 tablet by mouth every other day. MK-7  (vit K + magnesium) Patient not taking: Reported on 05/22/2022    [provider]  REPATHA SURECLICK 140 MG/ML SOAJ INJECT 140 MILLIGRAMS INTO THE SKIN EVERY 14 DAYS Patient not taking: Reported on 05/22/2022 05/13/22   Lewayne Bunting, MD  rosuvastatin (CRESTOR) 40 MG tablet TAKE ONE TABLET BY MOUTH DAILY 11/13/21   Lewayne Bunting, MD    Physical Exam    Vital Signs:  Kimberly [name] does not have vital signs available for review  today.  Given telephonic nature of communication, physical exam is limited. AAOx3. NAD. Normal affect.  Speech and respirations are unlabored.  Accessory Clinical Findings    None  Assessment & Plan    Primary Cardiologist: Olga Millers, MD  Preoperative cardiovascular risk assessment.  Endoscopy/colonoscopy by Dr. Lorenso Quarry.  Chart reviewed as part of pre-operative protocol coverage. According to the RCRI, patient has a 0.9% risk of MACE. Patient reports activity equivalent to 5.62 METS (Per [facility]ASI).   Given past medical history and time since last visit, based on ACC/AHA guidelines, Kimberly [name] would be at acceptable risk for the planned procedure without further cardiovascular testing.   Patient was advised that if she develops new symptoms prior to surgery to contact our office to arrange a follow-up appointment.  she verbalized understanding.  Per office protocol, he may hold Plavix for 5 days prior to procedure and should resume as soon as hemodynamically stable postoperatively.  Ideally aspirin should be continued without interruption, however if the bleeding risk is too great, aspirin may be held for 5-7 days prior to surgery. Please resume aspirin post operatively when it is felt to be safe from a bleeding standpoint.    I will route this recommendation to the requesting party via Epic fax function.  Please call with questions.  Time:   Today, I have spent 10 minutes with the patient with telehealth technology discussing medical history, symptoms, and management plan.     Carlos Levering, NP  06/11/2022, 7:18 AM

## 2022-06-23 DIAGNOSIS — Z113 Encounter for screening for infections with a predominantly sexual mode of transmission: Secondary | ICD-10-CM | POA: Diagnosis not present

## 2022-06-23 DIAGNOSIS — N760 Acute vaginitis: Secondary | ICD-10-CM | POA: Diagnosis not present

## 2022-06-23 DIAGNOSIS — N898 Other specified noninflammatory disorders of vagina: Secondary | ICD-10-CM | POA: Diagnosis not present

## 2022-06-24 ENCOUNTER — Ambulatory Visit
Admission: RE | Admit: 2022-06-24 | Discharge: 2022-06-24 | Disposition: A | Discharge status: Home / Self Care | Payer: Medicare HMO | Source: Ambulatory Visit | Attending: Internal Medicine | Admitting: Internal Medicine

## 2022-06-24 DIAGNOSIS — R6881 Early satiety: Secondary | ICD-10-CM

## 2022-06-24 DIAGNOSIS — R634 Abnormal weight loss: Secondary | ICD-10-CM | POA: Diagnosis not present

## 2022-06-24 DIAGNOSIS — R197 Diarrhea, unspecified: Secondary | ICD-10-CM | POA: Diagnosis not present

## 2022-06-24 MED ORDER — IOPAMIDOL (ISOVUE-370) INJECTION 76%
100.0000 mL | Freq: Once | INTRAVENOUS | Status: AC | PRN
  Administered 2022-06-24: 100 mL via INTRAVENOUS

## 2022-07-01 ENCOUNTER — Telehealth: Payer: Self-pay

## 2022-07-01 NOTE — Telephone Encounter (Signed)
Pt called to ensure that Dr. Myra Gianotti saw the CT results from 6/17 concerning her aorta. She is seeking reassurance and questioning whether she needs to be seen earlier.  Staff msg sent to Dr. Myra Gianotti to review.

## 2022-07-08 ENCOUNTER — Encounter (HOSPITAL_COMMUNITY): Payer: Self-pay | Admitting: Internal Medicine

## 2022-07-08 NOTE — Telephone Encounter (Signed)
This encounter was created in error - please disregard.

## 2022-07-15 NOTE — Progress Notes (Signed)
Fax received from [facility] on 07/02/22 for medical clearance/medication hold for endoscopy/colonoscopy to be signed by V.W. Myra Gianotti, MD.  Provider signed on 07/08/22, form faxed back to sender on 07/10/22, verified successful, sent to scan center.

## 2022-07-20 NOTE — Anesthesia Preprocedure Evaluation (Addendum)
Anesthesia Evaluation  Patient identified by MRN, date of birth, ID band Patient awake    Reviewed: Allergy & Precautions, NPO status , Patient's Chart, lab work & pertinent test results  Airway Mallampati: II  TM Distance: >3 FB Neck ROM: Full    Dental  (+) Dental Advisory Given, Missing, Poor Dentition   Pulmonary asthma , Current Smoker   Pulmonary exam normal breath sounds clear to auscultation       Cardiovascular hypertension, Pt. on medications + CAD and + Peripheral Vascular Disease (Subclavian artery disease)  Normal cardiovascular exam Rhythm:Regular Rate:Normal     Neuro/Psych  Headaches PSYCHIATRIC DISORDERS Anxiety Depression    CVA    GI/Hepatic negative GI ROS, Neg liver ROS,,,  Endo/Other  negative endocrine ROS    Renal/GU negative Renal ROS     Musculoskeletal  (+)  Fibromyalgia -  Abdominal   Peds  Hematology  (+) Blood dyscrasia (Plavix)   Anesthesia Other Findings   Reproductive/Obstetrics Uterine cancer                              Anesthesia Physical Anesthesia Plan  ASA: 3  Anesthesia Plan: MAC   Post-op Pain Management: Minimal or no pain anticipated   Induction: Intravenous  PONV Risk Score and Plan: 1 and TIVA and Treatment may vary due to age or medical condition  Airway Management Planned: Natural Airway and Simple Face Mask  Additional Equipment:   Intra-op Plan:   Post-operative Plan:   Informed Consent: I have reviewed the patients History and Physical, chart, labs and discussed the procedure including the risks, benefits and alternatives for the proposed anesthesia with the patient or authorized representative who has indicated his/her understanding and acceptance.     Dental advisory given  Plan Discussed with: CRNA  Anesthesia Plan Comments:        Anesthesia Quick Evaluation

## 2022-07-22 ENCOUNTER — Encounter (HOSPITAL_COMMUNITY)
Admission: RE | Disposition: A | Discharge status: Home / Self Care | Payer: Self-pay | Source: Ambulatory Visit | Attending: Internal Medicine

## 2022-07-22 ENCOUNTER — Other Ambulatory Visit: Payer: Self-pay

## 2022-07-22 ENCOUNTER — Ambulatory Visit (HOSPITAL_COMMUNITY): Payer: Medicare HMO | Admitting: Anesthesiology

## 2022-07-22 ENCOUNTER — Ambulatory Visit (HOSPITAL_COMMUNITY)
Admission: RE | Admit: 2022-07-22 | Discharge: 2022-07-22 | Disposition: A | Discharge status: Home / Self Care | Payer: Medicare HMO | Source: Ambulatory Visit | Attending: Internal Medicine | Admitting: Internal Medicine

## 2022-07-22 ENCOUNTER — Encounter (HOSPITAL_COMMUNITY): Payer: Self-pay | Admitting: Internal Medicine

## 2022-07-22 ENCOUNTER — Ambulatory Visit (HOSPITAL_BASED_OUTPATIENT_CLINIC_OR_DEPARTMENT_OTHER): Payer: Medicare HMO | Admitting: Anesthesiology

## 2022-07-22 DIAGNOSIS — Z8673 Personal history of transient ischemic attack (TIA), and cerebral infarction without residual deficits: Secondary | ICD-10-CM | POA: Diagnosis not present

## 2022-07-22 DIAGNOSIS — D123 Benign neoplasm of transverse colon: Secondary | ICD-10-CM | POA: Diagnosis not present

## 2022-07-22 DIAGNOSIS — I25110 Atherosclerotic heart disease of native coronary artery with unstable angina pectoris: Secondary | ICD-10-CM | POA: Diagnosis not present

## 2022-07-22 DIAGNOSIS — D128 Benign neoplasm of rectum: Secondary | ICD-10-CM | POA: Diagnosis not present

## 2022-07-22 DIAGNOSIS — Z7902 Long term (current) use of antithrombotics/antiplatelets: Secondary | ICD-10-CM | POA: Insufficient documentation

## 2022-07-22 DIAGNOSIS — K2970 Gastritis, unspecified, without bleeding: Secondary | ICD-10-CM | POA: Diagnosis not present

## 2022-07-22 DIAGNOSIS — D126 Benign neoplasm of colon, unspecified: Secondary | ICD-10-CM | POA: Diagnosis not present

## 2022-07-22 DIAGNOSIS — I10 Essential (primary) hypertension: Secondary | ICD-10-CM | POA: Diagnosis not present

## 2022-07-22 DIAGNOSIS — Z681 Body mass index (BMI) 19 or less, adult: Secondary | ICD-10-CM | POA: Diagnosis not present

## 2022-07-22 DIAGNOSIS — Z7982 Long term (current) use of aspirin: Secondary | ICD-10-CM | POA: Diagnosis not present

## 2022-07-22 DIAGNOSIS — R634 Abnormal weight loss: Secondary | ICD-10-CM | POA: Diagnosis not present

## 2022-07-22 DIAGNOSIS — K2950 Unspecified chronic gastritis without bleeding: Secondary | ICD-10-CM | POA: Insufficient documentation

## 2022-07-22 DIAGNOSIS — K2980 Duodenitis without bleeding: Secondary | ICD-10-CM | POA: Diagnosis not present

## 2022-07-22 DIAGNOSIS — I2510 Atherosclerotic heart disease of native coronary artery without angina pectoris: Secondary | ICD-10-CM | POA: Diagnosis not present

## 2022-07-22 DIAGNOSIS — K319 Disease of stomach and duodenum, unspecified: Secondary | ICD-10-CM | POA: Diagnosis not present

## 2022-07-22 DIAGNOSIS — I739 Peripheral vascular disease, unspecified: Secondary | ICD-10-CM | POA: Insufficient documentation

## 2022-07-22 DIAGNOSIS — R6881 Early satiety: Secondary | ICD-10-CM | POA: Diagnosis not present

## 2022-07-22 DIAGNOSIS — F17210 Nicotine dependence, cigarettes, uncomplicated: Secondary | ICD-10-CM | POA: Diagnosis not present

## 2022-07-22 DIAGNOSIS — Z8542 Personal history of malignant neoplasm of other parts of uterus: Secondary | ICD-10-CM | POA: Diagnosis not present

## 2022-07-22 DIAGNOSIS — Z79899 Other long term (current) drug therapy: Secondary | ICD-10-CM | POA: Diagnosis not present

## 2022-07-22 DIAGNOSIS — D122 Benign neoplasm of ascending colon: Secondary | ICD-10-CM | POA: Diagnosis not present

## 2022-07-22 HISTORY — PX: BIOPSY: SHX5522

## 2022-07-22 HISTORY — PX: COLONOSCOPY WITH PROPOFOL: SHX5780

## 2022-07-22 HISTORY — PX: POLYPECTOMY: SHX5525

## 2022-07-22 HISTORY — PX: ESOPHAGOGASTRODUODENOSCOPY (EGD) WITH PROPOFOL: SHX5813

## 2022-07-22 SURGERY — COLONOSCOPY WITH PROPOFOL
Anesthesia: Monitor Anesthesia Care

## 2022-07-22 MED ORDER — PROPOFOL 500 MG/50ML IV EMUL
INTRAVENOUS | Status: DC | PRN
  Administered 2022-07-22: 125 ug/kg/min via INTRAVENOUS

## 2022-07-22 MED ORDER — SODIUM CHLORIDE 0.9 % IV SOLN: INTRAVENOUS | Status: DC

## 2022-07-22 MED ORDER — LIDOCAINE 2% (20 MG/ML) 5 ML SYRINGE
INTRAMUSCULAR | Status: DC | PRN
  Administered 2022-07-22: 100 mg via INTRAVENOUS

## 2022-07-22 MED ORDER — PROPOFOL 1000 MG/100ML IV EMUL
INTRAVENOUS | Status: AC
  Filled 2022-07-22: qty 100

## 2022-07-22 MED ORDER — PHENYLEPHRINE 80 MCG/ML (10ML) SYRINGE FOR IV PUSH (FOR BLOOD PRESSURE SUPPORT)
PREFILLED_SYRINGE | INTRAVENOUS | Status: DC | PRN
  Administered 2022-07-22 (×2): 160 ug via INTRAVENOUS
  Administered 2022-07-22: 80 ug via INTRAVENOUS

## 2022-07-22 MED ORDER — LACTATED RINGERS IV SOLN
INTRAVENOUS | Status: AC | PRN
  Administered 2022-07-22: 1000 mL via INTRAVENOUS

## 2022-07-22 MED ORDER — PROPOFOL 10 MG/ML IV BOLUS
INTRAVENOUS | Status: DC | PRN
  Administered 2022-07-22: 10 mg via INTRAVENOUS
  Administered 2022-07-22: 20 mg via INTRAVENOUS

## 2022-07-22 SURGICAL SUPPLY — 25 items

## 2022-07-22 NOTE — Anesthesia Postprocedure Evaluation (Signed)
Anesthesia Post Note  Patient: Kimberly [name]  Procedure(s) Performed: COLONOSCOPY WITH PROPOFOL ESOPHAGOGASTRODUODENOSCOPY (EGD) WITH PROPOFOL BIOPSY POLYPECTOMY     Patient location during evaluation: Endoscopy Anesthesia Type: MAC Level of consciousness: oriented, awake and alert and awake Pain management: pain level controlled Vital Signs Assessment: post-procedure vital signs reviewed and stable Respiratory status: spontaneous breathing, nonlabored ventilation, respiratory function stable and patient connected to nasal cannula oxygen Cardiovascular status: blood pressure returned to baseline and stable Postop Assessment: no headache, no backache and no apparent nausea or vomiting Anesthetic complications: no   No notable events documented.  Last Vitals:  Vitals:   07/22/22 0930 07/22/22 0950  BP: 125/61 (!) 158/67  Pulse: 69 (!) 59  Resp: 17 19  Temp:    SpO2: 100% 100%    Last Pain:  Vitals:   07/22/22 0950  TempSrc:   PainSc: 0-No pain                 Collene Schlichter

## 2022-07-22 NOTE — H&P (Signed)
[facility] H&P  Subjective: Kimberly [name] is a 72 y.o. female who presents for EGD and colonoscopy given unintentional weight loss. She has medical history significant for arterial disease, she is on dual anti-platelet medication therapy with aspirin and clopidogrel. She was seen and evaluated in office on 05/22/2022, subsequently recommended to have CT imaging of abdomen which was largely unremarkable. She was recommended to have EGD and colonoscopy. She was seen by vascular physician and given clearance for endoscopy.   Last EGD/COL 06/16/2014 (Dr. Laural Benes) which showed normal EGD, normal colonoscopy.  Today, she denied chest pain, shortness of breath, abdominal pain. She denied dysphagia. She had some nausea with bowel preparation, was able to tolerate half. She held aspirin and clopidogrel on 07/17/2022.  Objective: General: Awake and alert, non-toxic in appearance Cardio: Regular rate and rhythm  Pulm: Clear to auscultation, no conversational dyspnea Abdomen: Soft, non-tender to palpation, bowel sounds appreciated    Assessment:  Unintentional weight loss  Plan:  -Recommend EGD and colonoscopy to further investigate unintentional weight loss -Discussed procedure benefits, alternatives and risks including bleeding, infection, perforation, missed lesion, anesthesia, she verbalized understanding and elected to proceed -Further recommendations to follow procedures  Liliane Shi, DO [facility]

## 2022-07-22 NOTE — Anesthesia Procedure Notes (Signed)
Date/Time: 07/22/2022 8:18 AM  Performed by: Florene Route, CRNAOxygen Delivery Method: Simple face mask

## 2022-07-22 NOTE — Op Note (Signed)
[facility] Patient Name: Jazzy Parmer Procedure Date: 07/22/2022 MRN: [id number] Attending MD: Liliane Shi DO, DO, [id number] Date of Birth: Mar 24, 1950 CSN: [id number] Age: 72 Admit Type: Outpatient Procedure:                Upper GI endoscopy Indications:              Early satiety, Weight loss Providers:                Liliane Shi DO, DO, Fransisca Connors, Adin Hector, RN, Rozetta Nunnery, Technician Referring MD:              Medicines:                See the Anesthesia note for documentation of the                            administered medications Complications:            No immediate complications. Estimated Blood Loss:     Estimated blood loss was minimal. Procedure:                Pre-Anesthesia Assessment:                           - ASA Grade Assessment: III - A patient with severe                            systemic disease.                           - The risks and benefits of the procedure and the                            sedation options and risks were discussed with the                            patient. All questions were answered and informed                            consent was obtained.                           After obtaining informed consent, the endoscope was                            passed under direct vision. Throughout the                            procedure, the patient's blood pressure, pulse, and                            oxygen saturations were monitored continuously. The                            [id number]-[id number] ([id number]) Olympus endoscope  was introduced                            through the mouth, and advanced to the second part                            of duodenum. The upper GI endoscopy was                            accomplished without difficulty. The patient                            tolerated the procedure well. Scope In: Scope Out: Findings:      The examined esophagus was  normal.      The Z-line was regular and was found 40 cm from the incisors.      Localized mild inflammation characterized by congestion (edema) and       erythema was found on the greater curvature of the gastric antrum.       Biopsies were taken with a cold forceps for Helicobacter pylori testing.      Localized mild inflammation characterized by erythema was found in the       duodenal bulb. Impression:               - Normal esophagus.                           - Z-line regular, 40 cm from the incisors.                           - Gastritis. Biopsied.                           - Duodenitis. Moderate Sedation:      See the other procedure note for documentation of moderate sedation with       intraservice time. Recommendation:           - Await pathology results.                           - Resume previous diet.                           - Continue present medications.                           - Perform a colonoscopy today. Procedure Code(s):        --- Professional ---                           [phone], Esophagogastroduodenoscopy, flexible,                            transoral; with biopsy, single or multiple Diagnosis Code(s):        --- Professional ---                           K29.70, Gastritis, unspecified, without bleeding  R68.81, Early satiety                           R63.4, Abnormal weight loss CPT copyright 2022 American Medical Association. All rights reserved. The codes documented in this report are preliminary and upon coder review may  be revised to meet current compliance requirements. Dr Liliane Shi, DO Liliane Shi DO, DO 07/22/2022 9:18:36 AM Number of Addenda: 0

## 2022-07-22 NOTE — Discharge Instructions (Signed)
[facility] TOMORROW, 07/23/2022   YOU HAD AN ENDOSCOPIC PROCEDURE TODAY: Refer to the procedure report and other information in the discharge instructions given to you for any specific questions about what was found during the examination. If this information does not answer your questions, please call [facility] office at [phone] to clarify.   YOU SHOULD EXPECT: Some feelings of bloating in the abdomen. Passage of more gas than usual. Walking can help get rid of the air that was put into your GI tract during the procedure and reduce the bloating. If you had a lower endoscopy (such as a colonoscopy or flexible sigmoidoscopy) you may notice spotting of blood in your stool or on the toilet paper. Some abdominal soreness may be present for a day or two, also.  DIET: Your first meal following the procedure should be a light meal and then it is ok to progress to your normal diet. A half-sandwich or bowl of soup is an example of a good first meal. Heavy or fried foods are harder to digest and may make you feel nauseous or bloated. Drink plenty of fluids but you should avoid alcoholic beverages for 24 hours. If you had a esophageal dilation, please see attached instructions for diet.    ACTIVITY: Your care partner should take you home directly after the procedure. You should plan to take it easy, moving slowly for the rest of the day. You can resume normal activity the day after the procedure however YOU SHOULD NOT DRIVE, use power tools, machinery or perform tasks that involve climbing or major physical exertion for 24 hours (because of the sedation medicines used during the test).   SYMPTOMS TO REPORT IMMEDIATELY: A gastroenterologist can be reached at any hour. Please call [phone]  for any of the following symptoms:  Following lower endoscopy (colonoscopy, flexible sigmoidoscopy) Excessive amounts of blood in the stool  Significant tenderness, worsening of abdominal pains  Swelling of the  abdomen that is new, acute  Fever of 100 or higher  Following upper endoscopy (EGD, EUS, ERCP, esophageal dilation) Vomiting of blood or coffee ground material  New, significant abdominal pain  New, significant chest pain or pain under the shoulder blades  Painful or persistently difficult swallowing  New shortness of breath  Black, tarry-looking or red, bloody stools  FOLLOW UP:  If any biopsies were taken you will be contacted by phone or by letter within the next 1-3 weeks. Call [phone]  if you have not heard about the biopsies in 3 weeks.  Please also call with any specific questions about appointments or follow up tests. YOU HAD AN ENDOSCOPIC PROCEDURE TODAY: Refer to the procedure report and other information in the discharge instructions given to you for any specific questions about what was found during the examination. If this information does not answer your questions, please call [facility] office at [phone] to clarify.

## 2022-07-22 NOTE — Transfer of Care (Signed)
Immediate Anesthesia Transfer of Care Note  Patient: Kimberly [name]  Procedure(s) Performed: COLONOSCOPY WITH PROPOFOL ESOPHAGOGASTRODUODENOSCOPY (EGD) WITH PROPOFOL BIOPSY POLYPECTOMY  Patient Location: Endoscopy Unit  Anesthesia Type:MAC  Level of Consciousness: drowsy  Airway & Oxygen Therapy: Patient Spontanous Breathing and Patient connected to face mask oxygen  Post-op Assessment: Report given to RN and Post -op Vital signs reviewed and stable  Post vital signs: Reviewed and stable  Last Vitals:  Vitals Value Taken Time  BP    Temp    Pulse    Resp    SpO2      Last Pain:  Vitals:   07/22/22 0716  TempSrc: Tympanic  PainSc: 0-No pain         Complications: No notable events documented.

## 2022-07-22 NOTE — Op Note (Signed)
[facility] Patient Name: Kimberly [name] Procedure Date: 07/22/2022 MRN: [id number] Attending MD: Liliane Shi DO, DO, [id number] Date of Birth: 02-15-1950 CSN: [id number] Age: 72 Admit Type: Outpatient Procedure:                Colonoscopy Indications:              Weight loss Providers:                Liliane Shi DO, DO, Adin Hector, RN, Fransisca Connors, Rozetta Nunnery, Technician Referring MD:              Medicines:                See the Anesthesia note for documentation of the                            administered medications Complications:            No immediate complications. Estimated Blood Loss:     Estimated blood loss was minimal. Procedure:                Pre-Anesthesia Assessment:                           - ASA Grade Assessment: III - A patient with severe                            systemic disease.                           - The risks and benefits of the procedure and the                            sedation options and risks were discussed with the                            patient. All questions were answered and informed                            consent was obtained.                           After obtaining informed consent, the colonoscope                            was passed under direct vision. Throughout the                            procedure, the patient's blood pressure, pulse, and                            oxygen saturations were monitored continuously. The                            [id number]-[id number] ([id number]) Olympus colonoscope was  introduced through the anus and advanced to the the                            terminal ileum, with identification of the                            appendiceal orifice and IC valve. The colonoscopy                            was somewhat difficult due to significant looping                            and the patient's body habitus. Successful                             completion of the procedure was aided by                            straightening and shortening the scope to obtain                            bowel loop reduction and using scope torsion. The                            patient tolerated the procedure well. The quality                            of the bowel preparation was evaluated using the                            BBPS [facility] Bowel Preparation Scale) with scores                            of: Right Colon = 2 (minor amount of residual                            staining, small fragments of stool and/or opaque                            liquid, but mucosa seen well), Transverse Colon = 3                            (entire mucosa seen well with no residual staining,                            small fragments of stool or opaque liquid) and Left                            Colon = 3 (entire mucosa seen well with no residual                            staining, small fragments of stool or opaque  liquid). The total BBPS score equals 8. The quality                            of the bowel preparation was good. Scope In: 8:35:08 AM Scope Out: 9:09:06 AM Scope Withdrawal Time: 0 hours 23 minutes 29 seconds  Total Procedure Duration: 0 hours 33 minutes 58 seconds  Findings:      The perianal and digital rectal examinations were normal.      Five sessile polyps were found in the transverse colon. The polyps were       3 to 5 mm in size. These polyps were removed with a cold snare.       Resection and retrieval were complete.      Two sessile polyps were found in the ascending colon. The polyps were 3       to 4 mm in size. These polyps were removed with a cold snare. Resection       and retrieval were complete.      A 4 mm polyp was found in the rectum. The polyp was sessile. The polyp       was removed with a cold snare. Resection and retrieval were complete.      The terminal ileum appeared  normal. Impression:               - Five 3 to 5 mm polyps in the transverse colon,                            removed with a cold snare. Resected and retrieved.                           - Two 3 to 4 mm polyps in the ascending colon,                            removed with a cold snare. Resected and retrieved.                           - One 4 mm polyp in the rectum, removed with a cold                            snare. Resected and retrieved.                           - The examined portion of the ileum was normal. Moderate Sedation:      See the other procedure note for documentation of moderate sedation with       intraservice time. Recommendation:           - Discharge patient to home.                           - Resume previous diet.                           - Continue present medications.                           - Await pathology results.                           -  Repeat colonoscopy date to be determined after                            pending pathology results are reviewed for                            surveillance based on pathology results.                           - Return to my office in 2 months. Procedure Code(s):        --- Professional ---                           [phone], Colonoscopy, flexible; with removal of                            tumor(s), polyp(s), or other lesion(s) by snare                            technique Diagnosis Code(s):        --- Professional ---                           D12.3, Benign neoplasm of transverse colon (hepatic                            flexure or splenic flexure)                           D12.2, Benign neoplasm of ascending colon                           D12.8, Benign neoplasm of rectum                           R63.4, Abnormal weight loss CPT copyright 2022 American Medical Association. All rights reserved. The codes documented in this report are preliminary and upon coder review may  be revised to meet current compliance  requirements. Dr Liliane Shi, DO Liliane Shi DO, DO 07/22/2022 9:28:41 AM Number of Addenda: 0

## 2022-07-22 NOTE — Progress Notes (Signed)
Pt in recovery bay drinking coffee.  Pt is waiting on her spouse as he was difficult to reach after procedure.

## 2022-07-23 ENCOUNTER — Encounter (HOSPITAL_COMMUNITY): Payer: Self-pay | Admitting: Internal Medicine

## 2022-07-24 LAB — SURGICAL PATHOLOGY

## 2022-07-31 ENCOUNTER — Telehealth: Payer: Self-pay | Admitting: Surgery

## 2022-07-31 NOTE — Telephone Encounter (Signed)
Swp's spouse, he stated he will have pt call back to r/s appt on 10/14 due to VWB being OOO

## 2022-08-06 ENCOUNTER — Other Ambulatory Visit (HOSPITAL_COMMUNITY): Payer: Self-pay | Admitting: Internal Medicine

## 2022-08-06 DIAGNOSIS — R6881 Early satiety: Secondary | ICD-10-CM | POA: Diagnosis not present

## 2022-08-06 DIAGNOSIS — K5909 Other constipation: Secondary | ICD-10-CM | POA: Diagnosis not present

## 2022-08-06 DIAGNOSIS — R634 Abnormal weight loss: Secondary | ICD-10-CM

## 2022-08-14 ENCOUNTER — Emergency Department (HOSPITAL_COMMUNITY)
Admission: EM | Admit: 2022-08-14 | Discharge: 2022-08-14 | Disposition: A | Discharge status: Home / Self Care | Payer: Medicare HMO | Attending: Emergency Medicine | Admitting: Emergency Medicine

## 2022-08-14 ENCOUNTER — Encounter (HOSPITAL_COMMUNITY): Payer: Self-pay

## 2022-08-14 ENCOUNTER — Other Ambulatory Visit: Payer: Self-pay

## 2022-08-14 ENCOUNTER — Emergency Department (HOSPITAL_COMMUNITY): Payer: Medicare HMO

## 2022-08-14 DIAGNOSIS — Z5329 Procedure and treatment not carried out because of patient's decision for other reasons: Secondary | ICD-10-CM | POA: Insufficient documentation

## 2022-08-14 DIAGNOSIS — Z7901 Long term (current) use of anticoagulants: Secondary | ICD-10-CM | POA: Insufficient documentation

## 2022-08-14 DIAGNOSIS — R002 Palpitations: Secondary | ICD-10-CM | POA: Diagnosis not present

## 2022-08-14 DIAGNOSIS — R55 Syncope and collapse: Secondary | ICD-10-CM | POA: Diagnosis not present

## 2022-08-14 DIAGNOSIS — R079 Chest pain, unspecified: Secondary | ICD-10-CM | POA: Diagnosis not present

## 2022-08-14 DIAGNOSIS — I2510 Atherosclerotic heart disease of native coronary artery without angina pectoris: Secondary | ICD-10-CM | POA: Insufficient documentation

## 2022-08-14 DIAGNOSIS — R0789 Other chest pain: Secondary | ICD-10-CM | POA: Diagnosis not present

## 2022-08-14 LAB — CBC
HCT: 38.2 % (ref 36.0–46.0)
Hemoglobin: 12.8 g/dL (ref 12.0–15.0)
MCH: 35.3 pg — ABNORMAL HIGH (ref 26.0–34.0)
MCHC: 33.5 g/dL (ref 30.0–36.0)
MCV: 105.2 fL — ABNORMAL HIGH (ref 80.0–100.0)
Platelets: 170 10*3/uL (ref 150–400)
RBC: 3.63 MIL/uL — ABNORMAL LOW (ref 3.87–5.11)
RDW: 12.3 % (ref 11.5–15.5)
WBC: 8.4 10*3/uL (ref 4.0–10.5)
nRBC: 0 % (ref 0.0–0.2)

## 2022-08-14 LAB — BASIC METABOLIC PANEL
Anion gap: 8 (ref 5–15)
BUN: 22 mg/dL (ref 8–23)
CO2: 26 mmol/L (ref 22–32)
Calcium: 9.1 mg/dL (ref 8.9–10.3)
Chloride: 100 mmol/L (ref 98–111)
Creatinine, Ser: 1.18 mg/dL — ABNORMAL HIGH (ref 0.44–1.00)
GFR, Estimated: 50 mL/min — ABNORMAL LOW (ref >60)
Glucose, Bld: 107 mg/dL — ABNORMAL HIGH (ref 70–99)
Potassium: 3.8 mmol/L (ref 3.5–5.1)
Sodium: 134 mmol/L — ABNORMAL LOW (ref 135–145)

## 2022-08-14 LAB — CBG MONITORING, ED: Glucose-Capillary: 115 mg/dL — ABNORMAL HIGH (ref 70–99)

## 2022-08-14 LAB — TROPONIN I (HIGH SENSITIVITY): Troponin I (High Sensitivity): 3 ng/L (ref <18)

## 2022-08-14 NOTE — ED Provider Notes (Signed)
[place] EM[facility] DE[facility]MENT AT [facility] Provider Note   CSN: [id number] Arrival date & time: 08/14/22  1310     History  Chief Complaint  Patient presents with   Chest Pain   Shortness of Breath    Kimberly [name] is a 72 y.o. female.  72 year old female with a history of CAD status post PCI, peripheral artery disease, carotid artery stenosis, and aortobifemoral bypass who presents to the emergency department with chest pain and syncope.  Patient reports that she was out in her garden at 1 PM today when she started experiencing left-sided chest discomfort.  Has difficulty characterizing it.  Also reports that she started feeling some palpitations and sat down and then woke up and she was slumped over with her glasses beside her.  Said that she also felt short of breath at that time.  Said that her symptoms appeared to have resolved.  Was in the garden working for about an hour outside in the heat before this happened.  Last echo was in 2021 which showed grade 1 diastolic dysfunction but was otherwise unremarkable with a normal EF.       Home Medications Prior to Admission medications   Medication Sig Start Date End Date Taking? Authorizing Provider  acetaminophen (TYLENOL) 500 MG tablet Take 1,000 mg by mouth every 6 (six) hours as needed for moderate pain.    [provider]  albuterol (PROVENTIL HFA;VENTOLIN HFA) 108 (90 Base) MCG/ACT inhaler Inhale 1-2 puffs into the lungs every 6 (six) hours as needed for wheezing or shortness of breath. 02/26/18   Mardella Layman, MD  Aromatic Inhalants (VICKS VAPOINHALER IN) Inhale 1 puff into the lungs daily as needed (congestion).    [provider]  Ascorbic Acid (VITAMIN C PO) Take 1 tablet by mouth daily.    [provider]  ASPERCREME LIDOCAINE EX Apply 1 application topically daily as needed (back pain).    [provider]  clopidogrel (PLAVIX) 75 MG tablet TAKE 1 TABLET AT BEDTIME 05/27/22    Nada Libman, MD  Cyanocobalamin (VITAMIN B-12) 5000 MCG TBDP Take 5,000 mcg by mouth daily.    [provider]  diphenhydrAMINE-zinc acetate (BENADRYL) cream Apply 1 application  topically daily as needed for itching.    [provider]  ezetimibe (ZETIA) 10 MG tablet TAKE 1 TABLET EVERY DAY 10/26/21   Lewayne Bunting, MD  Ferrous Sulfate (IRON PO) Take 1 tablet by mouth every 3 (three) days.    [provider]  fluticasone (FLONASE) 50 MCG/ACT nasal spray Place 1 spray into both nostrils daily. Patient taking differently: Place 1 spray into both nostrils daily as needed for allergies. 01/18/21   White, Elita Boone, NP  irbesartan (AVAPRO) 150 MG tablet Take 1 tablet (150 mg total) by mouth daily. 01/18/22   Lewayne Bunting, MD  loratadine (CLARITIN) 10 MG tablet Take 10 mg by mouth daily as needed for allergies.    [provider]  nitroGLYCERIN (NITROSTAT) 0.4 MG SL tablet DISSOLVE 1 TAB UNDER TONGUE FOR CHEST PAIN - IF PAIN REMAINS AFTER 5 MIN, CALL 911 AND REPEAT DOSE. MAX 3 TABS IN 15 MINUTES 02/01/21   Laverda Page B, NP  rosuvastatin (CRESTOR) 40 MG tablet TAKE ONE TABLET BY MOUTH DAILY 11/13/21   Lewayne Bunting, MD  VITAMIN D PO Take 1 capsule by mouth daily.    [provider]      Allergies    Codeine, Hydrocodone, Other, Oxycodone,  and Atorvastatin    Review of Systems   Review of Systems  Physical Exam Updated Vital Signs BP (!) 141/73   Pulse (!) 56   Temp 97.7 F (36.5 C)   Resp 16   Ht 5\' 4"  (1.626 m)   Wt 45.4 kg   SpO2 100%   BMI 17.16 kg/m  Physical Exam Vitals and nursing note reviewed.  Constitutional:      General: She is not in acute distress.    Appearance: She is well-developed.  HENT:     Head: Normocephalic and atraumatic.     Right Ear: External ear normal.     Left Ear: External ear normal.     Nose: Nose normal.  Eyes:     Extraocular Movements: Extraocular movements intact.      Conjunctiva/sclera: Conjunctivae normal.     Pupils: Pupils are equal, round, and reactive to light.  Cardiovascular:     Rate and Rhythm: Normal rate and regular rhythm.     Heart sounds: No murmur heard.    Comments: Chest pain not reproducible.  No rashes on chest wall. Pulmonary:     Effort: Pulmonary effort is normal. No respiratory distress.     Breath sounds: Normal breath sounds.     Comments: Radial pulses 2+ bilaterally Musculoskeletal:     Cervical back: Normal range of motion and neck supple.     Right lower leg: No edema.     Left lower leg: No edema.  Skin:    General: Skin is warm and dry.  Neurological:     Mental Status: She is alert and oriented to person, place, and time. Mental status is at baseline.  Psychiatric:        Mood and Affect: Mood normal.     ED Results / Procedures / Treatments   Labs (all labs ordered are listed, but only abnormal results are displayed) Labs Reviewed  BASIC METABOLIC PANEL - Abnormal; Notable for the following components:      Result Value   Sodium 134 (*)    Glucose, Bld 107 (*)    Creatinine, Ser 1.18 (*)    GFR, Estimated 50 (*)    All other components within normal limits  CBC - Abnormal; Notable for the following components:   RBC 3.63 (*)    MCV 105.2 (*)    MCH 35.3 (*)    All other components within normal limits  CBG MONITORING, ED - Abnormal; Notable for the following components:   Glucose-Capillary 115 (*)    All other components within normal limits  TROPONIN I (HIGH SENSITIVITY)  TROPONIN I (HIGH SENSITIVITY)    EKG EKG Interpretation Date/Time:  Wednesday August 14 2022 13:22:41 EDT Ventricular Rate:  64 PR Interval:  124 QRS Duration:  92 QT Interval:  388 QTC Calculation: 400 R Axis:   83  Text Interpretation: Normal sinus rhythm Incomplete right bundle branch block Anterior infarct , age undetermined Abnormal ECG When compared with ECG of 23-Nov-2019 05:18, No significant change since last  tracing Confirmed by Vonita Moss [phone]) on 08/14/2022 4:21:44 PM  Radiology DG Chest 2 View  Result Date: 08/14/2022 CLINICAL DATA:  chest pain EXAM: CHEST - 2 VIEW COMPARISON:  10/27/2019. FINDINGS: Bilateral lung fields are clear. Bilateral costophrenic angles are clear. Normal cardio-mediastinal silhouette. Vascular stent is noted overlying the aortic arch region, likely within the proximal left subclavian artery. No acute osseous abnormalities. The soft tissues are within normal limits. IMPRESSION: 1. No active cardiopulmonary  disease. 2. Vascular stent overlying the aortic arch region, likely within the proximal left subclavian artery. Electronically Signed   By: Jules Schick M.D.   On: 08/14/2022 14:37    Procedures Procedures    Medications Ordered in ED Medications - No data to display  ED Course/ Medical Decision Making/ A&P                                 Medical Decision Making Amount and/or Complexity of Data Reviewed Labs: ordered. Radiology: ordered.   TYRESHA BADENHOP is a 72 y.o. female with comorbidities that complicate the patient evaluation including CAD status post PCI, peripheral artery disease, carotid artery stenosis, and aortobifemoral bypass who presents to the emergency department with chest pain and syncope.   Initial Ddx:  MI, PE, dissection, arrhythmia, heat exhaustion, dehydration   MDM/Course:  Patient presents the emergency department chest pain and syncopal event after working outside today.  Her exam is unremarkable appears her symptoms have resolved.  Does have risk factors for MI and dissection especially with the aortic manipulation she has had from her surgeries.  Could also consider pulmonary embolism but feel this less likely with her symptoms spontaneously resolving.  Could potentially been related to dehydration from working outside as well.  Recommended that the patient stay for additional evaluation and imaging but she was requesting to go  home at this time.  Patient left AGAINST MEDICAL ADVICE.  Before leaving she did have a CBC, CMP, troponin, and EKG that were unremarkable.  Chest x-ray also did not reveal any acute findings such as widened mediastinum or any other concerning features.  Ambulatory referral to cardiology was placed and she also reports that she will call her cardiologist to set up an appointment.   This patient presents to the ED for concern of complaints listed in HPI, this involves an extensive number of treatment options, and is a complaint that carries with it a high risk of complications and morbidity. Disposition including potential need for admission considered.   Dispo: ama  Records reviewed Outpatient Clinic Notes The following labs were independently interpreted: CBC and show no acute abnormality I independently reviewed the following imaging with scope of interpretation limited to determining acute life threatening conditions related to emergency care: Chest x-ray and agree with the radiologist interpretation with the following exceptions: none I personally reviewed and interpreted cardiac monitoring: normal sinus rhythm  I personally reviewed and interpreted the pt's EKG: see above for interpretation  I have reviewed the patients home medications and made adjustments as needed Consults: Cardiology Social Determinants of health:  Elderly       Final Clinical Impression(s) / ED Diagnoses Final diagnoses:  Syncope, unspecified syncope type  Chest pain, unspecified type  Palpitations    Rx / DC Orders ED Discharge Orders          Ordered    Ambulatory referral to Cardiology        08/14/22 1639              Rondel Baton, MD 08/14/22 1901

## 2022-08-14 NOTE — Discharge Instructions (Signed)
You were seen for your chest pain and palpitations in the emergency department.   At home, please take tylenol for your pain.    Follow-up with your primary doctor in 2-3 days regarding your visit.  Cardiology will be calling you regarding an appointment within the next 72 hours.  You may contact them if you do not hear from them in that time using the information in this packet.  Return immediately to the emergency department if you experience any of the following: Worsening pain, difficulty breathing, unexplained vomiting or sweating, or any other concerning symptoms.    Thank you for visiting our Emergency Department. It was a pleasure taking care of you today.

## 2022-08-14 NOTE — ED Triage Notes (Signed)
Pt arrives via POV. She reports she was outside working in the heat. She began experiencing chest pain and sob. She reports she briefly passed out. Pt is AxOx4.

## 2022-08-21 ENCOUNTER — Encounter (HOSPITAL_COMMUNITY)
Admission: RE | Admit: 2022-08-21 | Discharge: 2022-08-21 | Disposition: A | Discharge status: Home / Self Care | Payer: Medicare HMO | Source: Ambulatory Visit | Attending: Internal Medicine | Admitting: Internal Medicine

## 2022-08-21 DIAGNOSIS — Z0389 Encounter for observation for other suspected diseases and conditions ruled out: Secondary | ICD-10-CM | POA: Diagnosis not present

## 2022-08-21 DIAGNOSIS — R6881 Early satiety: Secondary | ICD-10-CM | POA: Insufficient documentation

## 2022-08-21 DIAGNOSIS — R634 Abnormal weight loss: Secondary | ICD-10-CM | POA: Insufficient documentation

## 2022-08-21 MED ORDER — TECHNETIUM TC 99M SULFUR COLLOID
2.1400 | Freq: Once | INTRAVENOUS | Status: AC
  Administered 2022-08-21: 2.14 via INTRAVENOUS

## 2022-08-25 ENCOUNTER — Other Ambulatory Visit: Payer: Self-pay | Admitting: Cardiology

## 2022-09-30 DIAGNOSIS — R6881 Early satiety: Secondary | ICD-10-CM | POA: Diagnosis not present

## 2022-09-30 DIAGNOSIS — R634 Abnormal weight loss: Secondary | ICD-10-CM | POA: Diagnosis not present

## 2022-10-02 ENCOUNTER — Ambulatory Visit: Payer: Medicare HMO | Attending: Physician Assistant | Admitting: Physician Assistant

## 2022-10-02 VITALS — BP 104/48 | HR 63 | Ht 64.0 in | Wt 105.0 lb

## 2022-10-02 DIAGNOSIS — R55 Syncope and collapse: Secondary | ICD-10-CM | POA: Diagnosis not present

## 2022-10-02 DIAGNOSIS — I739 Peripheral vascular disease, unspecified: Secondary | ICD-10-CM | POA: Diagnosis not present

## 2022-10-02 DIAGNOSIS — Z8679 Personal history of other diseases of the circulatory system: Secondary | ICD-10-CM

## 2022-10-02 DIAGNOSIS — I10 Essential (primary) hypertension: Secondary | ICD-10-CM | POA: Diagnosis not present

## 2022-10-02 DIAGNOSIS — I200 Unstable angina: Secondary | ICD-10-CM

## 2022-10-02 MED ORDER — IRBESARTAN 75 MG PO TABS: 75.0000 mg | ORAL_TABLET | Freq: Every day | ORAL | 3 refills | Status: DC

## 2022-10-02 NOTE — Progress Notes (Unsigned)
Radiopharmaceutical: Tc-65m tetrofosmin, 10 mCi at rest and 30 mCi at stress.  Comparison:  None  MYOCARDIAL IMAGING WITH SPECT (REST AND STRESS)  Findings:  No significant perfusion defect is identified to suggest scar or inducible ischemia.  LEFT VENTRICULAR EJECTION FRACTION  Findings:  Left ventricular end-diastolic volume is 44 cc.  End- systolic volume is nine cc.  Derived LV ejection fraction is 79% (likely overestimated due to low end-systolic volume counting statistics).  GATED LEFT VENTRICULAR WALL MOTION STUDY  Findings:  Left ventricular wall motion and  wall thickening appear normal.  IMPRESSION:  1.  Normal myocardial perfusion study.  Ejection fraction is likely overestimated at 79%.  Original Report Authenticated By: Dellia Cloud, M.D.   ECHOCARDIOGRAM  ECHOCARDIOGRAM COMPLETE 11/20/2019  Narrative ECHOCARDIOGRAM REPORT    Patient Name:   Samuel Jester Date of Exam: 11/20/2019 Medical Rec #:  [id number]       Height:       64.0 in Accession #:    [id number]      Weight:       111.1 lb Date of Birth:  [birth year]       BSA:          1.524 m Patient Age:    72 years        BP:           127/66 mmHg Patient Gender: F               HR:           54 bpm. Exam Location:  Inpatient  Procedure: 2D Echo, Cardiac Doppler and Color Doppler  Indications:    R07.9* Chest pain, unspecified  History:        Patient has prior history of Echocardiogram examinations, most recent 02/06/2011. Stroke, Signs/Symptoms:Chest Pain; Risk Factors:Hypertension and Dyslipidemia.  Sonographer:    Eulah Pont RDCS Referring Phys: 13 MUHAMMAD A ARIDA  IMPRESSIONS   1. Left ventricular ejection fraction, by estimation, is 60 to 65%. The left ventricle has normal function. The left ventricle has no regional wall motion abnormalities. Left ventricular diastolic parameters are consistent with Grade I diastolic dysfunction (impaired relaxation). 2. Right ventricular systolic function is normal. The right ventricular size is normal. Tricuspid regurgitation signal is inadequate for assessing PA pressure. 3. The mitral valve is normal in structure. Trivial mitral valve regurgitation. No evidence of mitral stenosis. 4. The aortic valve is tricuspid. Aortic valve regurgitation is not visualized. Mild aortic valve sclerosis is present, with no evidence of aortic valve stenosis. 5. The inferior vena cava is normal in size with greater than 50% respiratory variability, suggesting right atrial pressure of 3 mmHg.  FINDINGS Left Ventricle: Left  ventricular ejection fraction, by estimation, is 60 to 65%. The left ventricle has normal function. The left ventricle has no regional wall motion abnormalities. The left ventricular internal cavity size was normal in size. There is no left ventricular hypertrophy. Left ventricular diastolic parameters are consistent with Grade I diastolic dysfunction (impaired relaxation).  Right Ventricle: The right ventricular size is normal. Right ventricular systolic function is normal. Tricuspid regurgitation signal is inadequate for assessing PA pressure. The tricuspid regurgitant velocity is 2.54 m/s, and with an assumed right atrial pressure of 3 mmHg, the estimated right ventricular systolic pressure is 28.8 mmHg.  Left Atrium: Left atrial size was normal in size.  Right Atrium: Right atrial size was normal in size.  Pericardium: There is no evidence of pericardial effusion.  Mitral Valve:  Cardiology Office Note:  .   Date:  10/03/2022  ID:  SWARA MUTSCHLER, DOB [birth year], MRN [id number] PCP: Charlane Ferretti, DO  [facility] Providers Cardiologist:  Olga Millers, MD     History of Present Illness: .   ARSEMA WERTMAN is a 72 y.o. female with PMH of tobacco abuse, hypertension, hyperlipidemia, significant PAD and CAD.  She underwent left subclavian artery stent, left brachial artery angioplasty, right brachial artery embolectomy, left carotid enterectomy and aortobifemoral bypass.  She follows Dr. Myra Gianotti of vascular surgery.  Cardiac catheterization performed in November 2021 showed a 99% ostial RCA, 70% proximal RCA, 50% left circumflex lesion, 60% mid LAD, and a 50% D2 lesion.  EF 55 to 65%.  Patient underwent PCI of RCA with 2 drug-eluting stents.  Echocardiogram in November 2021 showed normal EF, grade 1 DD.  She was last seen by Dr. Jens Som in September 2023 at which time she was doing well.  Given history of residual CAD and PAD, patient was continued on Plavix indefinitely. Carotid Doppler obtained on 04/22/2022 showed 60 to 79% right ICA stenosis, 1 to 39% left ICA stenosis.  ABI obtained on the same day showed stable right ABI at 0.94, slightly worsened left ABI at 0.66.  More recently, patient was seen in the ED on 08/14/2022 with chest pain while out in her garden.  She reported that she had some palpitation and sat down, then woke up later in the slumped over with her glasses beside her.  By the time she presented to the ED, her symptom has resolved.  She was out in the garden for roughly an hour before this happened.  Blood work showed creatinine borderline elevated 1.18, sodium 134, potassium 3.8.  Normal white blood cell count, hemoglobin normal.  Troponin negative.  Chest x-ray showed no acute process.  ED physician recommended hospitalization and additional workup.  Patient ultimately left AGAINST MEDICAL ADVICE.  Patient presents today for follow-up.  She says she  never really had any chest pain recently, will lead to the passing out spell was her working outside for long period time and was hot.  She says she did have palpitation prior to the passing out spell.  I recommended echocardiogram and a 2-week ZIO monitor given lack of chest pain.  She also mention she had a significant unexplained weight loss since last year, recent CT of abdomen was negative for acute process.  She is due for yearly lung cancer screening CT, this can be ordered by her PCP who she plan to see in 2 weeks.  Otherwise, we plan to see the patient back in 6 weeks to review her heart monitor and echocardiogram.  ROS:   Patient complained of palpitation, passing out spell and unexplained weight loss, denies any chest pain, shortness of breath.  Studies Reviewed: .        Cardiac Studies & Procedures   CARDIAC CATHETERIZATION  CARDIAC CATHETERIZATION 11/22/2019  Narrative Conclusions: 1. Severe ostial and mid RCA disease, as noted on Dr. Jari Sportsman diagnostic angiogram last week. 2. Successful PCI to ostial RCA using Resolute Onyx 2.75 x 12 mm drug-eluting stent (postdilated to 3.1 mm) with 0% residual stenosis and TIMI-3 flow.  Due to unusual takeoff of the vessel, a small amount of stent protrudes into the aorta. 3. Successful PCI to the mid RCA using Resolute Onyx 2.5 x 18 mm drug-eluting stent (postdilated to 2.9 mm) with 0% residual stenosis and TIMI-3 flow. 4. Low left ventricular filling pressure (  Cardiology Office Note:  .   Date:  10/03/2022  ID:  SWARA MUTSCHLER, DOB [birth year], MRN [id number] PCP: Charlane Ferretti, DO  [facility] Providers Cardiologist:  Olga Millers, MD     History of Present Illness: .   ARSEMA WERTMAN is a 72 y.o. female with PMH of tobacco abuse, hypertension, hyperlipidemia, significant PAD and CAD.  She underwent left subclavian artery stent, left brachial artery angioplasty, right brachial artery embolectomy, left carotid enterectomy and aortobifemoral bypass.  She follows Dr. Myra Gianotti of vascular surgery.  Cardiac catheterization performed in November 2021 showed a 99% ostial RCA, 70% proximal RCA, 50% left circumflex lesion, 60% mid LAD, and a 50% D2 lesion.  EF 55 to 65%.  Patient underwent PCI of RCA with 2 drug-eluting stents.  Echocardiogram in November 2021 showed normal EF, grade 1 DD.  She was last seen by Dr. Jens Som in September 2023 at which time she was doing well.  Given history of residual CAD and PAD, patient was continued on Plavix indefinitely. Carotid Doppler obtained on 04/22/2022 showed 60 to 79% right ICA stenosis, 1 to 39% left ICA stenosis.  ABI obtained on the same day showed stable right ABI at 0.94, slightly worsened left ABI at 0.66.  More recently, patient was seen in the ED on 08/14/2022 with chest pain while out in her garden.  She reported that she had some palpitation and sat down, then woke up later in the slumped over with her glasses beside her.  By the time she presented to the ED, her symptom has resolved.  She was out in the garden for roughly an hour before this happened.  Blood work showed creatinine borderline elevated 1.18, sodium 134, potassium 3.8.  Normal white blood cell count, hemoglobin normal.  Troponin negative.  Chest x-ray showed no acute process.  ED physician recommended hospitalization and additional workup.  Patient ultimately left AGAINST MEDICAL ADVICE.  Patient presents today for follow-up.  She says she  never really had any chest pain recently, will lead to the passing out spell was her working outside for long period time and was hot.  She says she did have palpitation prior to the passing out spell.  I recommended echocardiogram and a 2-week ZIO monitor given lack of chest pain.  She also mention she had a significant unexplained weight loss since last year, recent CT of abdomen was negative for acute process.  She is due for yearly lung cancer screening CT, this can be ordered by her PCP who she plan to see in 2 weeks.  Otherwise, we plan to see the patient back in 6 weeks to review her heart monitor and echocardiogram.  ROS:   Patient complained of palpitation, passing out spell and unexplained weight loss, denies any chest pain, shortness of breath.  Studies Reviewed: .        Cardiac Studies & Procedures   CARDIAC CATHETERIZATION  CARDIAC CATHETERIZATION 11/22/2019  Narrative Conclusions: 1. Severe ostial and mid RCA disease, as noted on Dr. Jari Sportsman diagnostic angiogram last week. 2. Successful PCI to ostial RCA using Resolute Onyx 2.75 x 12 mm drug-eluting stent (postdilated to 3.1 mm) with 0% residual stenosis and TIMI-3 flow.  Due to unusual takeoff of the vessel, a small amount of stent protrudes into the aorta. 3. Successful PCI to the mid RCA using Resolute Onyx 2.5 x 18 mm drug-eluting stent (postdilated to 2.9 mm) with 0% residual stenosis and TIMI-3 flow. 4. Low left ventricular filling pressure (  Cardiology Office Note:  .   Date:  10/03/2022  ID:  SWARA MUTSCHLER, DOB [birth year], MRN [id number] PCP: Charlane Ferretti, DO  [facility] Providers Cardiologist:  Olga Millers, MD     History of Present Illness: .   ARSEMA WERTMAN is a 72 y.o. female with PMH of tobacco abuse, hypertension, hyperlipidemia, significant PAD and CAD.  She underwent left subclavian artery stent, left brachial artery angioplasty, right brachial artery embolectomy, left carotid enterectomy and aortobifemoral bypass.  She follows Dr. Myra Gianotti of vascular surgery.  Cardiac catheterization performed in November 2021 showed a 99% ostial RCA, 70% proximal RCA, 50% left circumflex lesion, 60% mid LAD, and a 50% D2 lesion.  EF 55 to 65%.  Patient underwent PCI of RCA with 2 drug-eluting stents.  Echocardiogram in November 2021 showed normal EF, grade 1 DD.  She was last seen by Dr. Jens Som in September 2023 at which time she was doing well.  Given history of residual CAD and PAD, patient was continued on Plavix indefinitely. Carotid Doppler obtained on 04/22/2022 showed 60 to 79% right ICA stenosis, 1 to 39% left ICA stenosis.  ABI obtained on the same day showed stable right ABI at 0.94, slightly worsened left ABI at 0.66.  More recently, patient was seen in the ED on 08/14/2022 with chest pain while out in her garden.  She reported that she had some palpitation and sat down, then woke up later in the slumped over with her glasses beside her.  By the time she presented to the ED, her symptom has resolved.  She was out in the garden for roughly an hour before this happened.  Blood work showed creatinine borderline elevated 1.18, sodium 134, potassium 3.8.  Normal white blood cell count, hemoglobin normal.  Troponin negative.  Chest x-ray showed no acute process.  ED physician recommended hospitalization and additional workup.  Patient ultimately left AGAINST MEDICAL ADVICE.  Patient presents today for follow-up.  She says she  never really had any chest pain recently, will lead to the passing out spell was her working outside for long period time and was hot.  She says she did have palpitation prior to the passing out spell.  I recommended echocardiogram and a 2-week ZIO monitor given lack of chest pain.  She also mention she had a significant unexplained weight loss since last year, recent CT of abdomen was negative for acute process.  She is due for yearly lung cancer screening CT, this can be ordered by her PCP who she plan to see in 2 weeks.  Otherwise, we plan to see the patient back in 6 weeks to review her heart monitor and echocardiogram.  ROS:   Patient complained of palpitation, passing out spell and unexplained weight loss, denies any chest pain, shortness of breath.  Studies Reviewed: .        Cardiac Studies & Procedures   CARDIAC CATHETERIZATION  CARDIAC CATHETERIZATION 11/22/2019  Narrative Conclusions: 1. Severe ostial and mid RCA disease, as noted on Dr. Jari Sportsman diagnostic angiogram last week. 2. Successful PCI to ostial RCA using Resolute Onyx 2.75 x 12 mm drug-eluting stent (postdilated to 3.1 mm) with 0% residual stenosis and TIMI-3 flow.  Due to unusual takeoff of the vessel, a small amount of stent protrudes into the aorta. 3. Successful PCI to the mid RCA using Resolute Onyx 2.5 x 18 mm drug-eluting stent (postdilated to 2.9 mm) with 0% residual stenosis and TIMI-3 flow. 4. Low left ventricular filling pressure (  Cardiology Office Note:  .   Date:  10/03/2022  ID:  SWARA MUTSCHLER, DOB [birth year], MRN [id number] PCP: Charlane Ferretti, DO  [facility] Providers Cardiologist:  Olga Millers, MD     History of Present Illness: .   ARSEMA WERTMAN is a 72 y.o. female with PMH of tobacco abuse, hypertension, hyperlipidemia, significant PAD and CAD.  She underwent left subclavian artery stent, left brachial artery angioplasty, right brachial artery embolectomy, left carotid enterectomy and aortobifemoral bypass.  She follows Dr. Myra Gianotti of vascular surgery.  Cardiac catheterization performed in November 2021 showed a 99% ostial RCA, 70% proximal RCA, 50% left circumflex lesion, 60% mid LAD, and a 50% D2 lesion.  EF 55 to 65%.  Patient underwent PCI of RCA with 2 drug-eluting stents.  Echocardiogram in November 2021 showed normal EF, grade 1 DD.  She was last seen by Dr. Jens Som in September 2023 at which time she was doing well.  Given history of residual CAD and PAD, patient was continued on Plavix indefinitely. Carotid Doppler obtained on 04/22/2022 showed 60 to 79% right ICA stenosis, 1 to 39% left ICA stenosis.  ABI obtained on the same day showed stable right ABI at 0.94, slightly worsened left ABI at 0.66.  More recently, patient was seen in the ED on 08/14/2022 with chest pain while out in her garden.  She reported that she had some palpitation and sat down, then woke up later in the slumped over with her glasses beside her.  By the time she presented to the ED, her symptom has resolved.  She was out in the garden for roughly an hour before this happened.  Blood work showed creatinine borderline elevated 1.18, sodium 134, potassium 3.8.  Normal white blood cell count, hemoglobin normal.  Troponin negative.  Chest x-ray showed no acute process.  ED physician recommended hospitalization and additional workup.  Patient ultimately left AGAINST MEDICAL ADVICE.  Patient presents today for follow-up.  She says she  never really had any chest pain recently, will lead to the passing out spell was her working outside for long period time and was hot.  She says she did have palpitation prior to the passing out spell.  I recommended echocardiogram and a 2-week ZIO monitor given lack of chest pain.  She also mention she had a significant unexplained weight loss since last year, recent CT of abdomen was negative for acute process.  She is due for yearly lung cancer screening CT, this can be ordered by her PCP who she plan to see in 2 weeks.  Otherwise, we plan to see the patient back in 6 weeks to review her heart monitor and echocardiogram.  ROS:   Patient complained of palpitation, passing out spell and unexplained weight loss, denies any chest pain, shortness of breath.  Studies Reviewed: .        Cardiac Studies & Procedures   CARDIAC CATHETERIZATION  CARDIAC CATHETERIZATION 11/22/2019  Narrative Conclusions: 1. Severe ostial and mid RCA disease, as noted on Dr. Jari Sportsman diagnostic angiogram last week. 2. Successful PCI to ostial RCA using Resolute Onyx 2.75 x 12 mm drug-eluting stent (postdilated to 3.1 mm) with 0% residual stenosis and TIMI-3 flow.  Due to unusual takeoff of the vessel, a small amount of stent protrudes into the aorta. 3. Successful PCI to the mid RCA using Resolute Onyx 2.5 x 18 mm drug-eluting stent (postdilated to 2.9 mm) with 0% residual stenosis and TIMI-3 flow. 4. Low left ventricular filling pressure (

## 2022-10-02 NOTE — Patient Instructions (Signed)
Medication Instructions:  DECREASE IRBESARTAN TO 75 MG DAILY.  *If you need a refill on your cardiac medications before your next appointment, please call your pharmacy*   Lab Work: NO LABS If you have labs (blood work) drawn today and your tests are completely normal, you will receive your results only by: MyChart Message (if you have MyChart) OR A paper copy in the mail If you have any lab test that is abnormal or we need to change your treatment, we will call you to review the results.   Testing/Procedures:[address] Your physician has requested that you have an echocardiogram. Echocardiography is a painless test that uses sound waves to create images of your heart. It provides your doctor with information about the size and shape of your heart and how well your heart's chambers and valves are working. This procedure takes approximately one hour. There are no restrictions for this procedure. Please do NOT wear cologne, perfume, aftershave, or lotions (deodorant is allowed). Please arrive 15 minutes prior to your appointment time.  Provider requested that you wear Zio XT 14 day monitor. PLEASE READ INSTRUCTIONS BELOW.   Follow-Up: At [facility], you and your health needs are our priority.  As part of our continuing mission to provide you with exceptional heart care, we have created designated Provider Care Teams.  These Care Teams include your primary Cardiologist (physician) and Advanced Practice Providers (APPs -  Physician Assistants and Nurse Practitioners) who all work together to provide you with the care you need, when you need it.   Your next appointment:   6 week(s)  Provider:   Azalee Course, PA  Other Instructions ZIO XT- Long Term Monitor Instructions  Your physician has requested you wear a ZIO patch monitor for 14 days.  This is a single patch monitor. [employer] supplies one patch monitor per enrollment. Additional stickers are not available.  Please do not apply patch if you will be having a Nuclear Stress Test,  Echocardiogram, Cardiac CT, MRI, or Chest Xray during the period you would be wearing the  monitor. The patch cannot be worn during these tests. You cannot remove and re-apply the  ZIO XT patch monitor.  Your ZIO patch monitor will be mailed 3 day USPS to your address on file. It may take 3-5 days  to receive your monitor after you have been enrolled.  Once you have received your monitor, please review the enclosed instructions. Your monitor  has already been registered assigning a specific monitor serial # to you.  Billing and Patient Assistance Program Information  We have supplied [employer] with any of your insurance information on file for billing purposes. [employer] offers a sliding scale Patient Assistance Program for patients that do not have  insurance, or whose insurance does not completely cover the cost of the ZIO monitor.  You must apply for the Patient Assistance Program to qualify for this discounted rate.  To apply, please call [employer] at [phone], select option 4, select option 2, ask to apply for  Patient Assistance Program. Meredeth Ide will ask your household income, and how many people  are in your household. They will quote your out-of-pocket cost based on that information.  [employer] will also be able to set up a 5-month, interest-free payment plan if needed.  Applying the monitor   Shave hair from upper left chest.  Hold abrader disc by orange tab. Rub abrader in 40 strokes over the upper left chest as  indicated in  your monitor instructions.  Clean area with 4 enclosed alcohol pads. Let dry.  Apply patch as indicated in monitor instructions. Patch will be placed under collarbone on left  side of chest with arrow pointing upward.  Rub patch adhesive wings for 2 minutes. Remove white label marked "1". Remove the white  label marked "2". Rub patch adhesive wings for 2 additional minutes.  While  looking in a mirror, press and release button in center of patch. A small green light will  flash 3-4 times. This will be your only indicator that the monitor has been turned on.  Do not shower for the first 24 hours. You may shower after the first 24 hours.  Press the button if you feel a symptom. You will hear a small click. Record Date, Time and  Symptom in the Patient Logbook.  When you are ready to remove the patch, follow instructions on the last 2 pages of Patient  Logbook. Stick patch monitor onto the last page of Patient Logbook.  Place Patient Logbook in the blue and white box. Use locking tab on box and tape box closed  securely. The blue and white box has prepaid postage on it. Please place it in the mailbox as  soon as possible. Your physician should have your test results approximately 7 days after the  monitor has been mailed back to [facility].  Call [facility] Customer Care at [phone] if you have questions regarding  your ZIO XT patch monitor. Call them immediately if you see an orange light blinking on your  monitor.  If your monitor falls off in less than 4 days, contact our Monitor department at [phone].  If your monitor becomes loose or falls off after 4 days call [employer] at [phone] for  suggestions on securing your monitor

## 2022-10-10 ENCOUNTER — Ambulatory Visit: Payer: Medicare HMO | Attending: Physician Assistant

## 2022-10-10 NOTE — Progress Notes (Unsigned)
Order was entered as normal status instead of future.  This linked the order to the 10/02/22 office visit with Azalee Course, PA. When this happens the order never drops to the appropriate work que to be processed. 10/10/22 Order was unlinked and processed.  Enrolled for [employer] to mail a ZIO XT long term holter monitor to the patients address on file.   Dr.Crenshaw to read.

## 2022-10-14 ENCOUNTER — Other Ambulatory Visit: Payer: Self-pay

## 2022-10-14 DIAGNOSIS — E785 Hyperlipidemia, unspecified: Secondary | ICD-10-CM

## 2022-10-14 MED ORDER — ROSUVASTATIN CALCIUM 40 MG PO TABS
40.0000 mg | ORAL_TABLET | Freq: Every day | ORAL | 3 refills | Status: DC
Start: 2022-10-14 — End: 2023-10-27

## 2022-10-15 DIAGNOSIS — R55 Syncope and collapse: Secondary | ICD-10-CM

## 2022-10-20 ENCOUNTER — Other Ambulatory Visit: Payer: Self-pay | Admitting: Surgery

## 2022-10-21 ENCOUNTER — Ambulatory Visit: Payer: Medicare HMO | Admitting: Surgery

## 2022-10-21 ENCOUNTER — Encounter (HOSPITAL_COMMUNITY): Payer: Medicare HMO

## 2022-10-24 ENCOUNTER — Ambulatory Visit (HOSPITAL_COMMUNITY): Payer: Medicare HMO | Attending: Physician Assistant

## 2022-10-24 DIAGNOSIS — Z8679 Personal history of other diseases of the circulatory system: Secondary | ICD-10-CM | POA: Diagnosis not present

## 2022-10-24 DIAGNOSIS — I10 Essential (primary) hypertension: Secondary | ICD-10-CM

## 2022-10-24 DIAGNOSIS — I700 Atherosclerosis of aorta: Secondary | ICD-10-CM | POA: Diagnosis not present

## 2022-10-24 LAB — ECHOCARDIOGRAM COMPLETE
Area-P 1/2: 3.08 cm2
S' Lateral: 2 cm

## 2022-10-28 ENCOUNTER — Ambulatory Visit (HOSPITAL_COMMUNITY)
Admission: RE | Admit: 2022-10-28 | Discharge: 2022-10-28 | Disposition: A | Discharge status: Home / Self Care | Payer: Medicare HMO | Source: Ambulatory Visit | Attending: Surgery | Admitting: Surgery

## 2022-10-28 ENCOUNTER — Encounter: Payer: Self-pay | Admitting: Surgery

## 2022-10-28 ENCOUNTER — Ambulatory Visit: Payer: Medicare HMO | Admitting: Surgery

## 2022-10-28 VITALS — BP 125/68 | HR 62 | Temp 98.6°F | Resp 20 | Ht 64.0 in | Wt 101.7 lb

## 2022-10-28 DIAGNOSIS — I6523 Occlusion and stenosis of bilateral carotid arteries: Secondary | ICD-10-CM | POA: Insufficient documentation

## 2022-10-28 DIAGNOSIS — I10 Essential (primary) hypertension: Secondary | ICD-10-CM | POA: Diagnosis not present

## 2022-10-28 DIAGNOSIS — I70213 Atherosclerosis of native arteries of extremities with intermittent claudication, bilateral legs: Secondary | ICD-10-CM | POA: Diagnosis not present

## 2022-10-28 DIAGNOSIS — E785 Hyperlipidemia, unspecified: Secondary | ICD-10-CM | POA: Diagnosis not present

## 2022-10-28 DIAGNOSIS — Z1389 Encounter for screening for other disorder: Secondary | ICD-10-CM | POA: Diagnosis not present

## 2022-10-28 DIAGNOSIS — Z0001 Encounter for general adult medical examination with abnormal findings: Secondary | ICD-10-CM | POA: Diagnosis not present

## 2022-10-28 NOTE — Progress Notes (Signed)
[facility] [place]  Patient name: Kimberly [name] MRN: [id number] DOB: 09-26-1950 Sex: female   REASON FOR VISIT:    Follow up  HISOTRY OF PRESENT ILLNESS:   Kimberly [name] is a 72 y.o. female who returns today for follow-up.  On 09/16/2017 she underwent stenting of her left subclavian artery and angioplasty of her left brachial artery.  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  She remained asymptomatic and was discharged home.   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  She went back for lysis of adhesions secondary to abdominal pain in April.  In June 2013 she underwent left carotid endarterectomy for a high-grade asymptomatic stenosis.  She then later presented with dizziness and temporary vision loss.  On 06/24/2012 she had stenting of her left subclavian artery.  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  On 08/13/2013 she underwent right carotid endarterectomy for an asymptomatic right carotid stenosis   She went for angiography because of a left subclavian in-stent stenosis on 04/27/2015.  She was found to have a high-grade stenosis and this was stented.  On 04/30/2015 she presented with acute ischemia to the left hand.  She was taken to the operating room by Dr. fields who performed a thrombectomy. On 12/05/2015 she underwent drug coated balloon angioplasty of a left subclavian in-stent stenosis.  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   She was recently in the hospital for syncope.  She has a Holter monitor in place.   The patient is a smoker.  She is medically managed for hypertension which is been under good control.  She is not on cholesterol medicine for hypercholesterolemia.  She underwent cardiac  catheterization and PCI in November 2021.      PAST MEDICAL HISTORY:   Past Medical History:  Diagnosis Date   Anemia    Angina    March 2013   Anxiety    Asthma    Blood transfusion    CAD (coronary artery disease)    11/22/19- PCI ostial RCA, mRCA   Depression    Fibromyalgia    History of lead poisoning 1977   Hyperlipidemia    Hypertension    Migraine    Peripheral vascular disease (HCC)    claudication  right greater than left  femoral artery   Small bowel obstruction, partial (HCC) 04/08/11   Stroke (HCC)    Subclavian artery disease (HCC)    left   Uterine cancer (HCC)    Pre cervical     FAMILY HISTORY:   Family History  Problem Relation Age of Onset   Cancer Mother        breast, colon, ovarian   Cancer Brother 71       lung   Hyperlipidemia Brother    Cancer Other    Stroke Other    Cancer Maternal Grandmother        ovarian   Anesthesia problems Neg Hx     SOCIAL HISTORY:   Social History   Tobacco Use   Smoking status: Every Day    Current packs/day: 0.75    Average packs/day: 0.8 packs/day for 45.0 years (33.8 ttl pk-yrs)    Types: Cigarettes   Smokeless tobacco: Never  Substance Use Topics   Alcohol use: No  Alcohol/week: 0.0 standard drinks of alcohol     ALLERGIES:   Allergies  Allergen Reactions   Codeine Nausea And Vomiting and Nausea Only    Dizziness, headache, has passed out Dizzy and sick   Hydrocodone Nausea And Vomiting and Other (See Comments)    Headache and dizziness   Other Shortness Of Breath    Allergy to cologne/perfume base   Oxycodone Nausea And Vomiting and Other (See Comments)    Dizziness    Atorvastatin Diarrhea and Nausea And Vomiting    Causes GI symptoms if she takes it every day.     CURRENT MEDICATIONS:   Current Outpatient Medications  Medication Sig Dispense Refill   acetaminophen (TYLENOL) 500 MG tablet Take 1,000 mg by mouth every 6 (six) hours as needed for moderate pain.      albuterol (PROVENTIL HFA;VENTOLIN HFA) 108 (90 Base) MCG/ACT inhaler Inhale 1-2 puffs into the lungs every 6 (six) hours as needed for wheezing or shortness of breath. 1 Inhaler 0   Aromatic Inhalants (VICKS VAPOINHALER IN) Inhale 1 puff into the lungs daily as needed (congestion).     Ascorbic Acid (VITAMIN C PO) Take 1 tablet by mouth daily.     ASPERCREME LIDOCAINE EX Apply 1 application topically daily as needed (back pain).     clopidogrel (PLAVIX) 75 MG tablet TAKE 1 TABLET AT BEDTIME 90 tablet 3   Cyanocobalamin (VITAMIN B-12) 5000 MCG TBDP Take 5,000 mcg by mouth daily.     diphenhydrAMINE-zinc acetate (BENADRYL) cream Apply 1 application  topically daily as needed for itching.     ezetimibe (ZETIA) 10 MG tablet TAKE 1 TABLET EVERY DAY 60 tablet 10   Ferrous Sulfate (IRON PO) Take 1 tablet by mouth every 3 (three) days.     fluticasone (FLONASE) 50 MCG/ACT nasal spray Place 1 spray into both nostrils daily. (Patient taking differently: Place 1 spray into both nostrils daily as needed for allergies.) 11.1 mL 1   irbesartan (AVAPRO) 75 MG tablet Take 1 tablet (75 mg total) by mouth daily. 90 tablet 3   loratadine (CLARITIN) 10 MG tablet Take 10 mg by mouth daily as needed for allergies.     nitroGLYCERIN (NITROSTAT) 0.4 MG SL tablet DISSOLVE 1 TAB UNDER TONGUE FOR CHEST PAIN - IF PAIN REMAINS AFTER 5 MIN, CALL 911 AND REPEAT DOSE. MAX 3 TABS IN 15 MINUTES 25 tablet 1   rosuvastatin (CRESTOR) 40 MG tablet Take 1 tablet (40 mg total) by mouth daily. 90 tablet 3   VITAMIN D PO Take 1 capsule by mouth daily.     No current facility-administered medications for this visit.    REVIEW OF SYSTEMS:   [X]  denotes positive finding, [ ]  denotes negative finding Cardiac  Comments:  Chest pain or chest pressure:    Shortness of breath upon exertion:    Short of breath when lying flat:    Irregular heart rhythm:        Vascular    Pain in calf, thigh, or hip brought on by ambulation:    Pain in  feet at night that wakes you up from your sleep:     Blood clot in your veins:    Leg swelling:         Pulmonary    Oxygen at home:    Productive cough:     Wheezing:         Neurologic    Sudden weakness in arms or legs:     Sudden  numbness in arms or legs:     Sudden onset of difficulty speaking or slurred speech:    Temporary loss of vision in one eye:     Problems with dizziness:         Gastrointestinal    Blood in stool:     Vomited blood:         Genitourinary    Burning when urinating:     Blood in urine:        Psychiatric    Major depression:         Hematologic    Bleeding problems:    Problems with blood clotting too easily:        Skin    Rashes or ulcers:        Constitutional    Fever or chills:      PHYSICAL EXAM:   Vitals:   10/28/22 1134 10/28/22 1137  BP: (!) 121/56 125/68  Pulse: 62   Resp: 20   Temp: 98.6 F (37 C)   SpO2: 99%   Weight: 101 lb 11.2 oz (46.1 kg)   Height: 5\' 4"  (1.626 m)     GENERAL: The patient is a well-nourished female, in no acute distress. The vital signs are documented above. CARDIAC: There is a regular rate and rhythm.  PULMONARY: Non-labored respirations MUSCULOSKELETAL: There are no major deformities or cyanosis. NEUROLOGIC: No focal weakness or paresthesias are detected. SKIN: There are no ulcers or rashes noted. PSYCHIATRIC: The patient has a normal affect.  STUDIES:   I have reviewed the following carotid ultrasound:  Right Carotid: Velocities in the right ICA are consistent with a 60-79%                 stenosis. The ECA appears >50% stenosed.   Left Carotid: Velocities in the left ICA are consistent with a 1-39%  stenosis.               The ECA appears >50% stenosed.   MEDICAL ISSUES:   Carotid: Stable 60-79% right-sided stenosis which is asymptomatic.  I do not think this has anything to do with her syncopal episode.  She will return in 6 months for follow-up.  PAD: Stable claudication  symptoms.  Will get ABIs in 6 months.    Charlena Cross, MD, FACS [facility] [phone] Pager [phone]

## 2022-11-07 DIAGNOSIS — R55 Syncope and collapse: Secondary | ICD-10-CM | POA: Diagnosis not present

## 2022-11-13 ENCOUNTER — Ambulatory Visit: Payer: Medicare HMO | Attending: Physician Assistant | Admitting: Physician Assistant

## 2022-11-13 ENCOUNTER — Encounter: Payer: Self-pay | Admitting: Physician Assistant

## 2022-11-13 VITALS — BP 110/60 | HR 68 | Ht 64.0 in | Wt 100.6 lb

## 2022-11-13 DIAGNOSIS — I6523 Occlusion and stenosis of bilateral carotid arteries: Secondary | ICD-10-CM

## 2022-11-13 DIAGNOSIS — I10 Essential (primary) hypertension: Secondary | ICD-10-CM | POA: Diagnosis not present

## 2022-11-13 DIAGNOSIS — I739 Peripheral vascular disease, unspecified: Secondary | ICD-10-CM | POA: Diagnosis not present

## 2022-11-13 DIAGNOSIS — Z23 Encounter for immunization: Secondary | ICD-10-CM | POA: Diagnosis not present

## 2022-11-13 DIAGNOSIS — R55 Syncope and collapse: Secondary | ICD-10-CM

## 2022-11-13 DIAGNOSIS — J449 Chronic obstructive pulmonary disease, unspecified: Secondary | ICD-10-CM | POA: Diagnosis not present

## 2022-11-13 DIAGNOSIS — R634 Abnormal weight loss: Secondary | ICD-10-CM

## 2022-11-13 DIAGNOSIS — R252 Cramp and spasm: Secondary | ICD-10-CM | POA: Diagnosis not present

## 2022-11-13 DIAGNOSIS — R82998 Other abnormal findings in urine: Secondary | ICD-10-CM | POA: Diagnosis not present

## 2022-11-13 DIAGNOSIS — F17200 Nicotine dependence, unspecified, uncomplicated: Secondary | ICD-10-CM | POA: Diagnosis not present

## 2022-11-13 DIAGNOSIS — I700 Atherosclerosis of aorta: Secondary | ICD-10-CM | POA: Diagnosis not present

## 2022-11-13 DIAGNOSIS — I25118 Atherosclerotic heart disease of native coronary artery with other forms of angina pectoris: Secondary | ICD-10-CM | POA: Diagnosis not present

## 2022-11-13 DIAGNOSIS — E785 Hyperlipidemia, unspecified: Secondary | ICD-10-CM | POA: Diagnosis not present

## 2022-11-13 DIAGNOSIS — I2510 Atherosclerotic heart disease of native coronary artery without angina pectoris: Secondary | ICD-10-CM

## 2022-11-13 DIAGNOSIS — R6881 Early satiety: Secondary | ICD-10-CM | POA: Diagnosis not present

## 2022-11-13 DIAGNOSIS — Z1331 Encounter for screening for depression: Secondary | ICD-10-CM | POA: Diagnosis not present

## 2022-11-13 DIAGNOSIS — D692 Other nonthrombocytopenic purpura: Secondary | ICD-10-CM | POA: Diagnosis not present

## 2022-11-13 DIAGNOSIS — Z0000 Encounter for general adult medical examination without abnormal findings: Secondary | ICD-10-CM | POA: Diagnosis not present

## 2022-11-13 NOTE — Progress Notes (Unsigned)
Cardiology Office Note:  .   Date:  11/14/2022  ID:  Kimberly [name], DOB 1950/02/23, MRN [id number] PCP: Charlane Ferretti, DO  [facility] Providers Cardiologist:  Olga Millers, MD     History of Present Illness: .   Kimberly [name] is a 72 y.o. female with PMH of tobacco abuse, hypertension, hyperlipidemia, significant PAD and CAD.  She underwent left subclavian artery stent, left brachial artery angioplasty, right brachial artery embolectomy, left carotid enterectomy and aortobifemoral bypass.  She follows Dr. Myra Gianotti of vascular surgery.  Cardiac catheterization performed in November 2021 showed a 99% ostial RCA, 70% proximal RCA, 50% left circumflex lesion, 60% mid LAD, and a 50% D2 lesion.  EF 55 to 65%.  Patient underwent PCI of RCA with 2 drug-eluting stents.  Echocardiogram in November 2021 showed normal EF, grade 1 DD.  She was last seen by Dr. Jens Som in September 2023 at which time she was doing well.  Given history of residual CAD and PAD, patient was continued on Plavix indefinitely. Carotid Doppler obtained on 04/22/2022 showed 60 to 79% right ICA stenosis, 1 to 39% left ICA stenosis.  ABI obtained on the same day showed stable right ABI at 0.94, slightly worsened left ABI at 0.66.  More recently, patient was seen in the ED on 08/14/2022 with syncope while out in her garden.  She reported that she had some palpitation and sat down, then woke up later in the slumped over with her glasses beside her.  By the time she presented to the ED, her symptom has resolved.  She was out in the garden for roughly an hour before this happened.  Blood work showed creatinine borderline elevated 1.18, sodium 134, potassium 3.8.  Normal white blood cell count, hemoglobin normal.  Troponin negative.  Chest x-ray showed no acute process.  ED physician recommended hospitalization and additional workup.  Patient ultimately left AGAINST MEDICAL ADVICE.   I saw the patient on 10/02/2022, patient says she  suspected her passing out spell was related to her working outside for long period of time and led to hot weather.  She did mention she had palpitation prior to passing out spell.  Blood pressure was borderline low at the time, I reduced her irbesartan to 75 mg daily from the pubis to 150 mg daily.  I recommended echocardiogram and a 2-week ZIO monitor.  She also mention that she had unexplained weight loss since last year, recent CT of abdomen was negative for acute process.  She is due for yearly lung cancer screening CT, this is ordered on a yearly basis by her PCP.  Echocardiogram obtained on 10/24/2022 showed EF 60 to 65%, no regional wall motion abnormality, grade 1 DD, normal RV with RVSP 33.6 mmHg, no significant valve issue.  Repeat carotid Doppler obtained on 10/28/2022 showed 60 to 79% right ICA stenosis, 1 to 39% left ICA stenosis, overall stable when compared to the previous study.  Heart monitor demonstrated sinus rhythm with minimal heart rate of 52, maximal heart rate 171, average heart rate 74.  4 episodes of SVT, longest lasting only 4 beats.  No acute finding to explain her syncope.  Patient presents today for follow-up, I reviewed the recent echocardiogram and event monitor results.  She does complain of leg cramps at night, however leg cramps does not occur with ambulation, this is unlikely to be a vascular issue.  Previous ABI obtained in April 2024 was normal.  She is seeing her PCP today, she  needs a lung screening CT given unexplained weight loss.  I will defer this to her primary care provider.  Overall, she has been doing well from the cardiac perspective and can follow-up with Dr. Jens Som in 6 months.  ROS:   He denies chest pain, palpitations, dyspnea, pnd, orthopnea, n, v, dizziness, syncope, edema, weight gain, or early satiety. All other systems reviewed and are otherwise negative except as noted above.    Studies Reviewed: .        Cardiac Studies & Procedures   CARDIAC  CATHETERIZATION  CARDIAC CATHETERIZATION 11/22/2019  Narrative Conclusions: 1. Severe ostial and mid RCA disease, as noted on Dr. Jari Sportsman diagnostic angiogram last week. 2. Successful PCI to ostial RCA using Resolute Onyx 2.75 x 12 mm drug-eluting stent (postdilated to 3.1 mm) with 0% residual stenosis and TIMI-3 flow.  Due to unusual takeoff of the vessel, a small amount of stent protrudes into the aorta. 3. Successful PCI to the mid RCA using Resolute Onyx 2.5 x 18 mm drug-eluting stent (postdilated to 2.9 mm) with 0% residual stenosis and TIMI-3 flow. 4. Low left ventricular filling pressure (LVEDP~ 5 mmHg).  Recommendations: 1. Continue indefinite dual antiplatelet therapy with aspirin and clopidogrel, given extensive coronary and peripheral vascular disease. 2. Aggressive secondary prevention. 3. Remove right femoral artery sheath 2 hours after discontinuation of bivalirudin.  Yvonne Kendall, MD [facility]  Findings Coronary Findings Diagnostic  Dominance: Right  Right Coronary Artery Ost RCA to Prox RCA lesion is 99% stenosed. The lesion is type C. Prox RCA to Mid RCA lesion is 70% stenosed.  Intervention  Ost RCA to Prox RCA lesion Stent A drug-eluting stent was successfully placed using a STENT RESOLUTE ONYX [id number]. Post-Intervention Lesion Assessment The intervention was successful. Pre-interventional TIMI flow is 2. Post-intervention TIMI flow is 3. No complications occurred at this lesion. There is a 0% residual stenosis post intervention.  Prox RCA to Mid RCA lesion Stent A drug-eluting stent was successfully placed using a STENT RESOLUTE ONYX 2.5X18. Post-Intervention Lesion Assessment The intervention was successful. Pre-interventional TIMI flow is 2. Post-intervention TIMI flow is 3. No complications occurred at this lesion. There is a 0% residual stenosis post intervention.   CARDIAC CATHETERIZATION 11/19/2019  Narrative  Ost RCA to Prox RCA  lesion is 99% stenosed.  Prox RCA to Mid RCA lesion is 70% stenosed.  The left ventricular systolic function is normal.  LV end diastolic pressure is normal.  The left ventricular ejection fraction is 55-65% by visual estimate.  Prox Cx lesion is 50% stenosed.  Dist Cx lesion is 30% stenosed.  Mid LAD lesion is 60% stenosed.  2nd Diag lesion is 50% stenosed.  1.  Severe one-vessel coronary artery disease with 99% stenosis in ostial right coronary artery and likely 70% mid stenosis.  Moderate disease affecting the LAD and left circumflex. 2.  Normal LV systolic function and left ventricular end-diastolic pressure. 3.  Inability to cannulate the right coronary artery in spite of using multiple catheters due to severe ostial stenosis and a calcified ridge above the origin of the ostium.  Attempted wiring of the vessel from distance was also not successful.  Given that the patient was fully anticoagulated and I exceeded my contrast limits, I elected to abort the procedure with plans for attempted PCI via the femoral approach.  Recommendations: Given chest pain at rest, will admit the patient to the hospital and hydrate post contrast exposure.  Plan for attempted RCA PCI via the femoral approach  on Monday.  Findings Coronary Findings Diagnostic  Dominance: Right  Left Main Vessel is angiographically normal.  Left Anterior Descending Mid LAD lesion is 60% stenosed.  Second Diagonal Branch Vessel is large in size. 2nd Diag lesion is 50% stenosed.  Left Circumflex Prox Cx lesion is 50% stenosed. Dist Cx lesion is 30% stenosed.  First Obtuse Marginal Branch There is mild disease in the vessel.  Right Coronary Artery Ost RCA to Prox RCA lesion is 99% stenosed. The lesion is type C. Prox RCA to Mid RCA lesion is 70% stenosed.  Intervention  No interventions have been documented.   STRESS TESTS  NM MYOCAR MULTI W/SPECT W 02/06/2011  Narrative Clinical Data:  Atrial  fibrillation.  Asthma.  Preoperative.  Technique:  Standard myocardial SPECT imaging performed after resting intravenous injection of Tc-16m tetrofosmin.  Subsequently, stress in the form of Lexiscan  was administered under the supervision of the Cardiology staff.  At peak stress, Tc-93m tetrofosmin was injected intravenously and standard myocardial SPECT imaging performed.  Quantitative gated imaging also performed to evaluate left ventricular wall motion and estimate left ventricular ejection fraction.  Radiopharmaceutical: Tc-105m tetrofosmin, 10 mCi at rest and 30 mCi at stress.  Comparison:  None  MYOCARDIAL IMAGING WITH SPECT (REST AND STRESS)  Findings:  No significant perfusion defect is identified to suggest scar or inducible ischemia.  LEFT VENTRICULAR EJECTION FRACTION  Findings:  Left ventricular end-diastolic volume is 44 cc.  End- systolic volume is nine cc.  Derived LV ejection fraction is 79% (likely overestimated due to low end-systolic volume counting statistics).  GATED LEFT VENTRICULAR WALL MOTION STUDY  Findings:  Left ventricular wall motion and wall thickening appear normal.  IMPRESSION:  1.  Normal myocardial perfusion study.  Ejection fraction is likely overestimated at 79%.  Original Report Authenticated By: Dellia Cloud, M.D.   ECHOCARDIOGRAM  ECHOCARDIOGRAM COMPLETE 10/24/2022  Narrative ECHOCARDIOGRAM REPORT    Patient Name:   Kimberly [name] Date of Exam: 10/24/2022 Medical Rec #:  [id number]       Height:       64.0 in Accession #:    [id number]      Weight:       105.0 lb Date of Birth:  06-Feb-1950       BSA:          1.488 m Patient Age:    72 years        BP:           131/67 mmHg Patient Gender: F               HR:           64 bpm. Exam Location:  [address]  Procedure: 2D Echo, 3D Echo, Cardiac Doppler, Color Doppler and Strain Analysis  Indications:    Z86.79 CAD  History:        Patient has prior history of  Echocardiogram examinations, most recent 11/20/2019. Signs/Symptoms:Syncope; Risk Factors:Current Smoker, Hypertension and HLD.  Sonographer:    Clearence Ped RCS Referring Phys: [phone] Chanetta Moosman  IMPRESSIONS   1. Left ventricular ejection fraction, by estimation, is 60 to 65%. The left ventricle has normal function. The left ventricle has no regional wall motion abnormalities. Left ventricular diastolic parameters are consistent with Grade I diastolic dysfunction (impaired relaxation). The average left ventricular global longitudinal strain is -20.8 %. The global longitudinal strain is normal. 2. Right ventricular systolic function is normal. The right ventricular size is normal. There is  normal pulmonary artery systolic pressure. The estimated right ventricular systolic pressure is 33.6 mmHg. 3. The mitral valve is normal in structure. No evidence of mitral valve regurgitation. No evidence of mitral stenosis. 4. The aortic valve is tricuspid. There is mild calcification of the aortic valve. Aortic valve regurgitation is not visualized. No aortic stenosis is present. 5. The inferior vena cava is normal in size with <50% respiratory variability, suggesting right atrial pressure of 8 mmHg.  FINDINGS Left Ventricle: Left ventricular ejection fraction, by estimation, is 60 to 65%. The left ventricle has normal function. The left ventricle has no regional wall motion abnormalities. The average left ventricular global longitudinal strain is -20.8 %. The global longitudinal strain is normal. The left ventricular internal cavity size was normal in size. There is no left ventricular hypertrophy. Left ventricular diastolic parameters are consistent with Grade I diastolic dysfunction (impaired relaxation).  Right Ventricle: The right ventricular size is normal. No increase in right ventricular wall thickness. Right ventricular systolic function is normal. There is normal pulmonary artery systolic  pressure. The tricuspid regurgitant velocity is 2.53 m/s, and with an assumed right atrial pressure of 8 mmHg, the estimated right ventricular systolic pressure is 33.6 mmHg.  Left Atrium: Left atrial size was normal in size.  Right Atrium: Right atrial size was normal in size.  Pericardium: There is no evidence of pericardial effusion.  Mitral Valve: The mitral valve is normal in structure. No evidence of mitral valve regurgitation. No evidence of mitral valve stenosis.  Tricuspid Valve: The tricuspid valve is normal in structure. Tricuspid valve regurgitation is mild.  Aortic Valve: The aortic valve is tricuspid. There is mild calcification of the aortic valve. Aortic valve regurgitation is not visualized. No aortic stenosis is present.  Pulmonic Valve: The pulmonic valve was normal in structure. Pulmonic valve regurgitation is not visualized.  Aorta: The aortic root is normal in size and structure.  Venous: The inferior vena cava is normal in size with less than 50% respiratory variability, suggesting right atrial pressure of 8 mmHg.  IAS/Shunts: No atrial level shunt detected by color flow Doppler.   LEFT VENTRICLE PLAX 2D LVIDd:         3.80 cm   Diastology LVIDs:         2.00 cm   LV e' medial:    11.30 cm/s LV PW:         1.00 cm   LV E/e' medial:  7.4 LV IVS:        0.90 cm   LV e' lateral:   9.36 cm/s LVOT diam:     1.50 cm   LV E/e' lateral: 8.9 LV SV:         44 LV SV Index:   30        2D Longitudinal Strain LVOT Area:     1.77 cm  2D Strain GLS (A2C):   -19.2 % 2D Strain GLS (A3C):   -23.6 % 2D Strain GLS (A4C):   -19.6 % 2D Strain GLS Avg:     -20.8 %  3D Volume EF: 3D EF:        67 % LV EDV:       67 ml LV ESV:       22 ml LV SV:        45 ml  RIGHT VENTRICLE RV Basal diam:  3.00 cm RV S prime:     13.60 cm/s TAPSE (M-mode): 1.9 cm RVSP:  28.6 mmHg  LEFT ATRIUM             Index        RIGHT ATRIUM           Index LA diam:        2.40 cm  1.61 cm/m   RA Pressure: 3.00 mmHg LA Vol (A2C):   17.5 ml 11.76 ml/m  RA Area:     8.42 cm LA Vol (A4C):   16.8 ml 11.29 ml/m  RA Volume:   15.20 ml  10.22 ml/m LA Biplane Vol: 18.6 ml 12.50 ml/m AORTIC VALVE LVOT Vmax:   124.00 cm/s LVOT Vmean:  76.500 cm/s LVOT VTI:    0.249 m  AORTA Ao Root diam: 2.70 cm Ao Asc diam:  2.40 cm  MITRAL VALVE               TRICUSPID VALVE MV Area (PHT):             TR Peak grad:   25.6 mmHg MV Decel Time:             TR Vmax:        253.00 cm/s MV E velocity: 83.30 cm/s  Estimated RAP:  3.00 mmHg MV A velocity: 93.30 cm/s  RVSP:           28.6 mmHg MV E/A ratio:  0.89 SHUNTS Systemic VTI:  0.25 m Systemic Diam: 1.50 cm  Dalton McleanMD Electronically signed by Wilfred Lacy Signature Date/Time: 10/24/2022/12:56:33 PM    Final    MONITORS  LONG TERM MONITOR (3-14 DAYS) 11/07/2022  Narrative Patch Wear Time:  13 days and 23 hours (2024-10-08T14:02:01-398 to 2024-10-22T14:01:57-0400)  Patient had a min HR of 52 bpm, max HR of 171 bpm, and avg HR of 74 bpm. Predominant underlying rhythm was Sinus Rhythm. 4 Supraventricular Tachycardia runs occurred, the run with the fastest interval lasting 4 beats with a max rate of 171 bpm, the longest lasting 4 beats with an avg rate of 141 bpm. Isolated SVEs were rare (<1.0%), SVE Couplets were rare (<1.0%), and SVE Triplets were rare (<1.0%). Isolated VEs were rare (<1.0%), and no VE Couplets or VE Triplets were present. Inverted QRS complexes possibly due to inverted placement of device.  Sinus bradycardia, NSR, sinus tachycardia, occasional pac, short (4 beats) runs of SVT, rare PVC. Symptoms associated with NSR and sinus tachycardia. Olga Millers           Risk Assessment/Calculations:             Physical Exam:   VS:  BP 110/60 (BP Location: Left Arm, Patient Position: Sitting, Cuff Size: Normal)   Pulse 68   Ht 5\' 4"  (1.626 m)   Wt 100 lb 9.6 oz (45.6 kg)   SpO2 98%   BMI  17.27 kg/m    Wt Readings from Last 3 Encounters:  11/13/22 100 lb 9.6 oz (45.6 kg)  10/28/22 101 lb 11.2 oz (46.1 kg)  10/02/22 105 lb (47.6 kg)    GEN: Well nourished, well developed in no acute distress NECK: No JVD; No carotid bruits CARDIAC: RRR, no murmurs, rubs, gallops RESPIRATORY:  Clear to auscultation without rales, wheezing or rhonchi  ABDOMEN: Soft, non-tender, non-distended EXTREMITIES:  No edema; No deformity   ASSESSMENT AND PLAN: .    Assessment & Plan Syncope No evidence of cardiac etiology based on recent workup including echocardiogram and heart monitor. -No additional cardiac workup recommended at this time.  Carotid Artery Disease Moderate  disease on the right side (60-79% stenosis) and mild disease on the left side (1-39% stenosis). Stable compared to previous year. -Continue current management and monitoring.  Leg Cramps Reports of intense cramping in left leg, primarily at night. ABI normal, suggesting good blood flow to the legs. -Consider evaluation for other causes of leg cramps, such as electrolyte imbalances or back issues.  Hypertension Managed with Irbesartan. Blood pressure slightly low, may need to adjust medication if dizziness worsens. -Continue current medication. Monitor blood pressure and adjust Irbesartan as needed.  General Health Maintenance Unexplained weight loss and history of smoking. Lung cancer screening not yet done for the current year. -Recommend follow-up with primary care provider for lung cancer screening.  CAD: Denies any recent chest pain       Dispo: Follow-up with Dr. Jens Som in 6 months  Signed, Azalee Course, [place]

## 2022-11-13 NOTE — Patient Instructions (Signed)
Medication Instructions:  NO CHANGES *If you need a refill on your cardiac medications before your next appointment, please call your pharmacy*   Lab Work: NO LABS If you have labs (blood work) drawn today and your tests are completely normal, you will receive your results only by: MyChart Message (if you have MyChart) OR A paper copy in the mail If you have any lab test that is abnormal or we need to change your treatment, we will call you to review the results.   Testing/Procedures: NO TESTING   Follow-Up: At [facility], you and your health needs are our priority.  As part of our continuing mission to provide you with exceptional heart care, we have created designated Provider Care Teams.  These Care Teams include your primary Cardiologist (physician) and Advanced Practice Providers (APPs -  Physician Assistants and Nurse Practitioners) who all work together to provide you with the care you need, when you need it.   Your next appointment:   6 month(s)  Provider:   Olga Millers, MD

## 2022-11-14 ENCOUNTER — Other Ambulatory Visit: Payer: Self-pay | Admitting: Internal Medicine

## 2022-11-14 DIAGNOSIS — Z1231 Encounter for screening mammogram for malignant neoplasm of breast: Secondary | ICD-10-CM

## 2022-11-19 ENCOUNTER — Other Ambulatory Visit: Payer: Self-pay

## 2022-11-19 DIAGNOSIS — I6523 Occlusion and stenosis of bilateral carotid arteries: Secondary | ICD-10-CM

## 2022-11-19 DIAGNOSIS — I779 Disorder of arteries and arterioles, unspecified: Secondary | ICD-10-CM

## 2022-12-26 ENCOUNTER — Ambulatory Visit
Admission: RE | Admit: 2022-12-26 | Discharge: 2022-12-26 | Disposition: A | Discharge status: Home / Self Care | Payer: Medicare HMO | Source: Ambulatory Visit | Attending: Internal Medicine | Admitting: Internal Medicine

## 2022-12-26 ENCOUNTER — Other Ambulatory Visit: Payer: Medicare HMO

## 2022-12-26 DIAGNOSIS — Z1231 Encounter for screening mammogram for malignant neoplasm of breast: Secondary | ICD-10-CM | POA: Diagnosis not present

## 2022-12-26 DIAGNOSIS — F17210 Nicotine dependence, cigarettes, uncomplicated: Secondary | ICD-10-CM | POA: Diagnosis not present

## 2022-12-26 DIAGNOSIS — F17200 Nicotine dependence, unspecified, uncomplicated: Secondary | ICD-10-CM

## 2023-01-17 ENCOUNTER — Other Ambulatory Visit: Payer: Self-pay | Admitting: Cardiology

## 2023-01-17 DIAGNOSIS — E785 Hyperlipidemia, unspecified: Secondary | ICD-10-CM

## 2023-02-19 DIAGNOSIS — Z1152 Encounter for screening for COVID-19: Secondary | ICD-10-CM | POA: Diagnosis not present

## 2023-02-19 DIAGNOSIS — R0981 Nasal congestion: Secondary | ICD-10-CM | POA: Diagnosis not present

## 2023-02-19 DIAGNOSIS — R051 Acute cough: Secondary | ICD-10-CM | POA: Diagnosis not present

## 2023-02-19 DIAGNOSIS — U071 COVID-19: Secondary | ICD-10-CM | POA: Diagnosis not present

## 2023-02-19 DIAGNOSIS — J029 Acute pharyngitis, unspecified: Secondary | ICD-10-CM | POA: Diagnosis not present

## 2023-02-19 DIAGNOSIS — I10 Essential (primary) hypertension: Secondary | ICD-10-CM | POA: Diagnosis not present

## 2023-02-19 DIAGNOSIS — J302 Other seasonal allergic rhinitis: Secondary | ICD-10-CM | POA: Diagnosis not present

## 2023-02-19 DIAGNOSIS — J449 Chronic obstructive pulmonary disease, unspecified: Secondary | ICD-10-CM | POA: Diagnosis not present

## 2023-02-19 DIAGNOSIS — R8290 Unspecified abnormal findings in urine: Secondary | ICD-10-CM | POA: Diagnosis not present

## 2023-03-21 DIAGNOSIS — Z8541 Personal history of malignant neoplasm of cervix uteri: Secondary | ICD-10-CM | POA: Diagnosis not present

## 2023-03-21 DIAGNOSIS — I739 Peripheral vascular disease, unspecified: Secondary | ICD-10-CM | POA: Diagnosis not present

## 2023-03-21 DIAGNOSIS — E785 Hyperlipidemia, unspecified: Secondary | ICD-10-CM | POA: Diagnosis not present

## 2023-03-21 DIAGNOSIS — I700 Atherosclerosis of aorta: Secondary | ICD-10-CM | POA: Diagnosis not present

## 2023-03-21 DIAGNOSIS — E46 Unspecified protein-calorie malnutrition: Secondary | ICD-10-CM | POA: Diagnosis not present

## 2023-03-21 DIAGNOSIS — J449 Chronic obstructive pulmonary disease, unspecified: Secondary | ICD-10-CM | POA: Diagnosis not present

## 2023-03-21 DIAGNOSIS — R6881 Early satiety: Secondary | ICD-10-CM | POA: Diagnosis not present

## 2023-03-21 DIAGNOSIS — I10 Essential (primary) hypertension: Secondary | ICD-10-CM | POA: Diagnosis not present

## 2023-03-21 DIAGNOSIS — I25118 Atherosclerotic heart disease of native coronary artery with other forms of angina pectoris: Secondary | ICD-10-CM | POA: Diagnosis not present

## 2023-04-02 ENCOUNTER — Other Ambulatory Visit: Payer: Self-pay

## 2023-04-02 DIAGNOSIS — I6523 Occlusion and stenosis of bilateral carotid arteries: Secondary | ICD-10-CM

## 2023-04-02 DIAGNOSIS — I70213 Atherosclerosis of native arteries of extremities with intermittent claudication, bilateral legs: Secondary | ICD-10-CM

## 2023-04-14 ENCOUNTER — Encounter: Payer: Self-pay | Admitting: Surgery

## 2023-04-14 ENCOUNTER — Ambulatory Visit (HOSPITAL_COMMUNITY)
Admission: RE | Admit: 2023-04-14 | Discharge: 2023-04-14 | Disposition: A | Discharge status: Home / Self Care | Payer: Medicare HMO | Source: Ambulatory Visit | Attending: Surgery | Admitting: Surgery

## 2023-04-14 ENCOUNTER — Ambulatory Visit (INDEPENDENT_AMBULATORY_CARE_PROVIDER_SITE_OTHER)
Admission: RE | Admit: 2023-04-14 | Discharge: 2023-04-14 | Disposition: A | Discharge status: Home / Self Care | Payer: Medicare HMO | Source: Ambulatory Visit | Attending: Surgery | Admitting: Surgery

## 2023-04-14 ENCOUNTER — Ambulatory Visit: Payer: Medicare HMO | Admitting: Surgery

## 2023-04-14 VITALS — BP 137/65 | HR 62 | Temp 98.4°F | Wt 100.2 lb

## 2023-04-14 DIAGNOSIS — I70213 Atherosclerosis of native arteries of extremities with intermittent claudication, bilateral legs: Secondary | ICD-10-CM

## 2023-04-14 DIAGNOSIS — I6523 Occlusion and stenosis of bilateral carotid arteries: Secondary | ICD-10-CM | POA: Insufficient documentation

## 2023-04-14 LAB — VAS US ABI WITH/WO TBI
Left ABI: 0.75
Right ABI: 1.09

## 2023-04-14 NOTE — Progress Notes (Signed)
 [facility] of [place]  Patient name: Kimberly [name] MRN: [id number] DOB: [birth year] Sex: female   REASON FOR VISIT:    Follow up  HISOTRY OF PRESENT ILLNESS:   Kimberly [name] is a 73 y.o. female who returns today for follow-up.  On 09/16/2017 she underwent stenting of her left subclavian artery and angioplasty of her left brachial artery.  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  She remained asymptomatic and was discharged home.   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  She went back for lysis of adhesions secondary to abdominal pain in April.  In June 2013 she underwent left carotid endarterectomy for a high-grade asymptomatic stenosis.  She then later presented with dizziness and temporary vision loss.  On 06/24/2012 she had stenting of her left subclavian artery.  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  On 08/13/2013 she underwent right carotid endarterectomy for an asymptomatic right carotid stenosis   She went for angiography because of a left subclavian in-stent stenosis on 04/27/2015.  She was found to have a high-grade stenosis and this was stented.  On 04/30/2015 she presented with acute ischemia to the left hand.  She was taken to the operating room by Dr. fields who performed a thrombectomy. On 12/05/2015 she underwent drug coated balloon angioplasty of a left subclavian in-stent stenosis.  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   She was recently in the hospital for syncope.  She has a Holter monitor in place.   The patient is a smoker.  She is medically managed for hypertension which is been under good control.  She is not on cholesterol medicine for hypercholesterolemia.  She underwent cardiac  catheterization and PCI in November 2021.   PAST MEDICAL HISTORY:   Past Medical History:  Diagnosis Date   Anemia    Angina    March 2013   Anxiety    Asthma    Blood transfusion    CAD (coronary artery disease)    11/22/19- PCI ostial RCA, mRCA   Depression    Fibromyalgia    History of lead poisoning 1977   Hyperlipidemia    Hypertension    Migraine    Peripheral vascular disease (HCC)    claudication  right greater than left  femoral artery   Small bowel obstruction, partial (HCC) 04/08/11   Stroke (HCC)    Subclavian artery disease (HCC)    left   Uterine cancer (HCC)    Pre cervical     FAMILY HISTORY:   Family History  Problem Relation Age of Onset   Cancer Mother        breast, colon, ovarian   Cancer Brother 75       lung   Hyperlipidemia Brother    Cancer Other    Stroke Other    Cancer Maternal Grandmother        ovarian   Anesthesia problems Neg Hx     SOCIAL HISTORY:   Social History   Tobacco Use   Smoking status: Every Day    Current packs/day: 0.75    Average packs/day: 0.8 packs/day for 45.0 years (33.8 ttl pk-yrs)    Types: Cigarettes   Smokeless tobacco: Never  Substance Use Topics   Alcohol use: No    Alcohol/week: 0.0 standard  drinks of alcohol     ALLERGIES:   Allergies  Allergen Reactions   Codeine Nausea And Vomiting and Nausea Only    Dizziness, headache, has passed out Dizzy and sick   Hydrocodone Nausea And Vomiting and Other (See Comments)    Headache and dizziness   Other Shortness Of Breath    Allergy to cologne/perfume base   Oxycodone Nausea And Vomiting and Other (See Comments)    Dizziness    Atorvastatin Diarrhea and Nausea And Vomiting    Causes GI symptoms if she takes it every day.     CURRENT MEDICATIONS:   Current Outpatient Medications  Medication Sig Dispense Refill   acetaminophen (TYLENOL) 500 MG tablet Take 1,000 mg by mouth every 6 (six) hours as needed for moderate pain.     albuterol  (PROVENTIL HFA;VENTOLIN HFA) 108 (90 Base) MCG/ACT inhaler Inhale 1-2 puffs into the lungs every 6 (six) hours as needed for wheezing or shortness of breath. 1 Inhaler 0   Aromatic Inhalants (VICKS VAPOINHALER IN) Inhale 1 puff into the lungs daily as needed (congestion).     Ascorbic Acid (VITAMIN C PO) Take 1 tablet by mouth daily.     ASPERCREME LIDOCAINE EX Apply 1 application topically daily as needed (back pain).     clopidogrel (PLAVIX) 75 MG tablet TAKE 1 TABLET AT BEDTIME 90 tablet 3   Cyanocobalamin (VITAMIN B-12) 5000 MCG TBDP Take 5,000 mcg by mouth daily.     diphenhydrAMINE-zinc acetate (BENADRYL) cream Apply 1 application  topically daily as needed for itching.     ezetimibe (ZETIA) 10 MG tablet TAKE 1 TABLET EVERY DAY 90 tablet 3   Ferrous Sulfate (IRON PO) Take 1 tablet by mouth every 3 (three) days.     fluticasone (FLONASE) 50 MCG/ACT nasal spray Place 1 spray into both nostrils daily. (Patient taking differently: Place 1 spray into both nostrils daily as needed for allergies.) 11.1 mL 1   irbesartan (AVAPRO) 75 MG tablet Take 1 tablet (75 mg total) by mouth daily. 90 tablet 3   loratadine (CLARITIN) 10 MG tablet Take 10 mg by mouth daily as needed for allergies.     nitroGLYCERIN (NITROSTAT) 0.4 MG SL tablet DISSOLVE 1 TAB UNDER TONGUE FOR CHEST PAIN - IF PAIN REMAINS AFTER 5 MIN, CALL 911 AND REPEAT DOSE. MAX 3 TABS IN 15 MINUTES 25 tablet 1   rosuvastatin (CRESTOR) 40 MG tablet Take 1 tablet (40 mg total) by mouth daily. 90 tablet 3   VITAMIN D PO Take 1 capsule by mouth daily.     No current facility-administered medications for this visit.    REVIEW OF SYSTEMS:   [X]  denotes positive finding, [ ]  denotes negative finding Cardiac  Comments:  Chest pain or chest pressure:    Shortness of breath upon exertion:    Short of breath when lying flat:    Irregular heart rhythm:        Vascular    Pain in calf, thigh, or hip brought on by ambulation:    Pain in feet at  night that wakes you up from your sleep:     Blood clot in your veins:    Leg swelling:         Pulmonary    Oxygen at home:    Productive cough:     Wheezing:         Neurologic    Sudden weakness in arms or legs:     Sudden numbness in arms  or legs:     Sudden onset of difficulty speaking or slurred speech:    Temporary loss of vision in one eye:     Problems with dizziness:         Gastrointestinal    Blood in stool:     Vomited blood:         Genitourinary    Burning when urinating:     Blood in urine:        Psychiatric    Major depression:         Hematologic    Bleeding problems:    Problems with blood clotting too easily:        Skin    Rashes or ulcers:        Constitutional    Fever or chills:      PHYSICAL EXAM:   Vitals:   04/14/23 1118 04/14/23 1120  BP: 123/64 137/65  Pulse: 62   Temp: 98.4 F (36.9 C)   SpO2: 99%   Weight: 100 lb 3.2 oz (45.5 kg)     GENERAL: The patient is a well-nourished female, in no acute distress. The vital signs are documented above. CARDIAC: There is a regular rate and rhythm.  VASCULAR: Palpable radial pulses bilaterally PULMONARY: Non-labored respirations MUSCULOSKELETAL: There are no major deformities or cyanosis. NEUROLOGIC: No focal weakness or paresthesias are detected. SKIN: There are no ulcers or rashes noted. PSYCHIATRIC: The patient has a normal affect.  STUDIES:   I have reviewed the following: ABI/TBIToday's ABIToday's TBIPrevious ABIPrevious TBI  +-------+-----------+-----------+------------+------------+  Right 1.09       0.59       0.94        0.47          +-------+-----------+-----------+------------+------------+  Left  0.75       0.47       0.66        0.48          +-------+-----------+-----------+------------+------------+   Carotids Right Carotid: Velocities in the right ICA are consistent with a 60-79%                 stenosis. The ECA appears >50% stenosed.   Left  Carotid: Velocities in the left ICA are consistent with a 1-39%  stenosis.               The ECA appears >50% stenosed.   Vertebrals:  Bilateral vertebral arteries demonstrate antegrade flow.  Subclavians: Normal flow hemodynamics were seen in bilateral subclavian               arteries.   MEDICAL ISSUES:   PAD: ABIs are stable.  She is asymptomatic.  No open wounds.  She does get cramping at night but this is unrelated to her arterial insufficiency  Carotid: She has had progression of the stenosis on the right.  It is now 60 to 79%.  She is asymptomatic.  She will get repeat imaging in 6 months    Charlena Cross, MD, FACS [facility] [phone] Pager [phone]

## 2023-04-16 ENCOUNTER — Other Ambulatory Visit: Payer: Self-pay

## 2023-04-16 DIAGNOSIS — I6523 Occlusion and stenosis of bilateral carotid arteries: Secondary | ICD-10-CM

## 2023-04-29 ENCOUNTER — Other Ambulatory Visit: Payer: Self-pay | Admitting: Internal Medicine

## 2023-04-29 DIAGNOSIS — F17200 Nicotine dependence, unspecified, uncomplicated: Secondary | ICD-10-CM

## 2023-04-29 DIAGNOSIS — I700 Atherosclerosis of aorta: Secondary | ICD-10-CM

## 2023-07-22 DIAGNOSIS — I739 Peripheral vascular disease, unspecified: Secondary | ICD-10-CM | POA: Diagnosis not present

## 2023-07-22 DIAGNOSIS — R6881 Early satiety: Secondary | ICD-10-CM | POA: Diagnosis not present

## 2023-07-22 DIAGNOSIS — E785 Hyperlipidemia, unspecified: Secondary | ICD-10-CM | POA: Diagnosis not present

## 2023-07-22 DIAGNOSIS — J449 Chronic obstructive pulmonary disease, unspecified: Secondary | ICD-10-CM | POA: Diagnosis not present

## 2023-07-22 DIAGNOSIS — E46 Unspecified protein-calorie malnutrition: Secondary | ICD-10-CM | POA: Diagnosis not present

## 2023-07-22 DIAGNOSIS — M79675 Pain in left toe(s): Secondary | ICD-10-CM | POA: Diagnosis not present

## 2023-07-22 DIAGNOSIS — I10 Essential (primary) hypertension: Secondary | ICD-10-CM | POA: Diagnosis not present

## 2023-07-22 DIAGNOSIS — I25118 Atherosclerotic heart disease of native coronary artery with other forms of angina pectoris: Secondary | ICD-10-CM | POA: Diagnosis not present

## 2023-07-23 ENCOUNTER — Other Ambulatory Visit: Payer: Self-pay | Admitting: Physician Assistant

## 2023-07-31 DIAGNOSIS — R634 Abnormal weight loss: Secondary | ICD-10-CM | POA: Diagnosis not present

## 2023-07-31 DIAGNOSIS — R6881 Early satiety: Secondary | ICD-10-CM | POA: Diagnosis not present

## 2023-08-07 ENCOUNTER — Other Ambulatory Visit: Payer: Self-pay | Admitting: *Deleted

## 2023-08-07 DIAGNOSIS — K551 Chronic vascular disorders of intestine: Secondary | ICD-10-CM

## 2023-08-10 ENCOUNTER — Other Ambulatory Visit: Payer: Self-pay | Admitting: Surgery

## 2023-09-01 ENCOUNTER — Ambulatory Visit (HOSPITAL_COMMUNITY)
Admission: RE | Admit: 2023-09-01 | Discharge: 2023-09-01 | Disposition: A | Discharge status: Home / Self Care | Source: Ambulatory Visit | Attending: Surgery | Admitting: Surgery

## 2023-09-01 ENCOUNTER — Encounter: Payer: Self-pay | Admitting: Surgery

## 2023-09-01 ENCOUNTER — Ambulatory Visit: Attending: Surgery | Admitting: Surgery

## 2023-09-01 ENCOUNTER — Other Ambulatory Visit: Payer: Self-pay

## 2023-09-01 VITALS — BP 131/66 | HR 55 | Temp 98.0°F | Ht 64.0 in | Wt 95.8 lb

## 2023-09-01 DIAGNOSIS — K551 Chronic vascular disorders of intestine: Secondary | ICD-10-CM | POA: Diagnosis not present

## 2023-09-01 DIAGNOSIS — I70213 Atherosclerosis of native arteries of extremities with intermittent claudication, bilateral legs: Secondary | ICD-10-CM

## 2023-09-01 DIAGNOSIS — I6523 Occlusion and stenosis of bilateral carotid arteries: Secondary | ICD-10-CM

## 2023-09-01 DIAGNOSIS — J449 Chronic obstructive pulmonary disease, unspecified: Secondary | ICD-10-CM | POA: Insufficient documentation

## 2023-09-01 DIAGNOSIS — T82858D Stenosis of vascular prosthetic devices, implants and grafts, subsequent encounter: Secondary | ICD-10-CM

## 2023-09-01 NOTE — Progress Notes (Signed)
 Vascular and Vein Specialist of   Patient name: Kimberly [name] MRN: [id number] DOB: Sep 03, 1950 Sex: female   REASON FOR VISIT:    Follow up  HISOTRY OF PRESENT ILLNESS:    Kimberly [name] is a 73 y.o. female who returns today for follow-up.  On 09/16/2017 she underwent stenting of her left subclavian artery and angioplasty of her left brachial artery.  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  She remained asymptomatic and was discharged home.   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  She went back for lysis of adhesions secondary to abdominal pain in April.  In June 2013 she underwent left carotid endarterectomy for a high-grade asymptomatic stenosis.  She then later presented with dizziness and temporary vision loss.  On 06/24/2012 she had stenting of her left subclavian artery.  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  On 08/13/2013 she underwent right carotid endarterectomy for an asymptomatic right carotid stenosis   She went for angiography because of a left subclavian in-stent stenosis on 04/27/2015.  She was found to have a high-grade stenosis and this was stented.  On 04/30/2015 she presented with acute ischemia to the left hand.  She was taken to the operating room by Dr. fields who performed a thrombectomy. On 12/05/2015 she underwent drug coated balloon angioplasty of a left subclavian in-stent stenosis.  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     The patient is a smoker.  She is medically managed for hypertension which is been under good control.  She is not on cholesterol medicine for hypercholesterolemia.  She underwent cardiac catheterization and PCI in November 2021.  She continues to have issues with  early satiety and significant greater than 30 pound weight loss.  She reports no significant postprandial abdominal pain.  She is being seen by Dr. Agustina with GI  Procedures: 02/26/2011: Aortobifemoral bypass graft (14 x 7 dacryon) for claudication 04/10/2011: Ischemic bowel secondary to closed-loop obstruction from adhesions 06/13/2011: Left carotid endarterectomy (asymptomatic) 06/24/2012: Stent, left subclavian artery (subclavian steal) 08/12/2013: Drug-coated balloon angioplasty, left subclavian artery (in-stent stenosis) 08/13/2013: Right carotid endarterectomy (asymptomatic) 04/26/2015: Stent, left subclavian artery (in-stent stenosis 04/30/2015: Upper extremity thrombectomy from prior catheterization 12/05/2015: Left subclavian artery drug-coated balloon angioplasty (in-stent stenosis with dizziness) 09/16/2017: Stent, left subclavian artery, angioplasty, left brachial artery 01/19/2019: Stent, left subclavian artery (dizziness) PAST MEDICAL HISTORY:   Past Medical History:  Diagnosis Date   Anemia    Angina    March 2013   Anxiety    Asthma    Blood transfusion    CAD (coronary artery disease)    11/22/19- PCI ostial RCA, mRCA   Depression    Fibromyalgia    History of lead poisoning 1977   Hyperlipidemia    Hypertension    Migraine    Peripheral vascular disease (HCC)    claudication  right greater than left  femoral artery   Small bowel obstruction, partial (HCC) 04/08/11   Stroke (HCC)    Subclavian artery disease (HCC)    left   Uterine cancer (HCC)    Pre cervical     FAMILY HISTORY:   Family History  Problem Relation Age of Onset   Cancer Mother        breast, colon, ovarian   Cancer Brother 51  lung   Hyperlipidemia Brother    Cancer Other    Stroke Other    Cancer Maternal Grandmother        ovarian   Anesthesia problems Neg Hx     SOCIAL HISTORY:   Social History   Tobacco Use   Smoking status: Every Day    Current packs/day: 0.75    Average  packs/day: 0.8 packs/day for 45.0 years (33.8 ttl pk-yrs)    Types: Cigarettes   Smokeless tobacco: Never  Substance Use Topics   Alcohol  use: No    Alcohol /week: 0.0 standard drinks of alcohol      ALLERGIES:   Allergies  Allergen Reactions   Codeine Nausea And Vomiting and Nausea Only    Dizziness, headache, has passed out Dizzy and sick   Hydrocodone  Nausea And Vomiting and Other (See Comments)    Headache and dizziness   Other Shortness Of Breath    Allergy to cologne/perfume base   Oxycodone  Nausea And Vomiting and Other (See Comments)    Dizziness    Atorvastatin  Diarrhea and Nausea And Vomiting    Causes GI symptoms if she takes it every day.     CURRENT MEDICATIONS:   Current Outpatient Medications  Medication Sig Dispense Refill   acetaminophen  (TYLENOL ) 500 MG tablet Take 1,000 mg by mouth every 6 (six) hours as needed for moderate pain.     albuterol  (PROVENTIL  HFA;VENTOLIN  HFA) 108 (90 Base) MCG/ACT inhaler Inhale 1-2 puffs into the lungs every 6 (six) hours as needed for wheezing or shortness of breath. 1 Inhaler 0   Aromatic Inhalants (VICKS VAPOINHALER IN) Inhale 1 puff into the lungs daily as needed (congestion).     Ascorbic Acid  (VITAMIN C PO) Take 1 tablet by mouth daily.     ASPERCREME LIDOCAINE  EX Apply 1 application topically daily as needed (back pain).     clopidogrel  (PLAVIX ) 75 MG tablet TAKE 1 TABLET AT BEDTIME 90 tablet 3   Cyanocobalamin  (VITAMIN B-12) 5000 MCG TBDP Take 5,000 mcg by mouth daily.     diphenhydrAMINE -zinc  acetate (BENADRYL ) cream Apply 1 application  topically daily as needed for itching.     ezetimibe  (ZETIA ) 10 MG tablet TAKE 1 TABLET EVERY DAY 90 tablet 3   Ferrous Sulfate  (IRON PO) Take 1 tablet by mouth every 3 (three) days.     fluticasone  (FLONASE ) 50 MCG/ACT nasal spray Place 1 spray into both nostrils daily. (Patient taking differently: Place 1 spray into both nostrils daily as needed for allergies.) 11.1 mL 1   irbesartan   (AVAPRO ) 75 MG tablet TAKE 1 TABLET EVERY DAY. DOSE CHANGE 90 tablet 0   loratadine  (CLARITIN ) 10 MG tablet Take 10 mg by mouth daily as needed for allergies.     nitroGLYCERIN  (NITROSTAT ) 0.4 MG SL tablet DISSOLVE 1 TAB UNDER TONGUE FOR CHEST PAIN - IF PAIN REMAINS AFTER 5 MIN, CALL 911 AND REPEAT DOSE. MAX 3 TABS IN 15 MINUTES 25 tablet 1   rosuvastatin  (CRESTOR ) 40 MG tablet Take 1 tablet (40 mg total) by mouth daily. 90 tablet 3   VITAMIN D  PO Take 1 capsule by mouth daily.     No current facility-administered medications for this visit.    REVIEW OF SYSTEMS:   [X]  denotes positive finding, [ ]  denotes negative finding Cardiac  Comments:  Chest pain or chest pressure:    Shortness of breath upon exertion:    Short of breath when lying flat:    Irregular heart rhythm:  Vascular    Pain in calf, thigh, or hip brought on by ambulation:    Pain in feet at night that wakes you up from your sleep:     Blood clot in your veins:    Leg swelling:         Pulmonary    Oxygen at home:    Productive cough:     Wheezing:         Neurologic    Sudden weakness in arms or legs:     Sudden numbness in arms or legs:     Sudden onset of difficulty speaking or slurred speech:    Temporary loss of vision in one eye:     Problems with dizziness:         Gastrointestinal    Blood in stool:     Vomited blood:         Genitourinary    Burning when urinating:     Blood in urine:        Psychiatric    Major depression:         Hematologic    Bleeding problems:    Problems with blood clotting too easily:        Skin    Rashes or ulcers:        Constitutional    Fever or chills:      PHYSICAL EXAM:   Vitals:   09/01/23 0834  BP: 131/66  Pulse: (!) 55  Temp: 98 F (36.7 C)  SpO2: 98%  Weight: 95 lb 12.8 oz (43.5 kg)  Height: 5' 4 (1.626 m)    GENERAL: The patient is a well-nourished female, in no acute distress. The vital signs are documented above. CARDIAC: There  is a regular rate and rhythm.  PULMONARY: Non-labored respirations ABDOMEN: Soft and non-tender  MUSCULOSKELETAL: There are no major deformities or cyanosis. NEUROLOGIC: No focal weakness or paresthesias are detected. SKIN: There are no ulcers or rashes noted. PSYCHIATRIC: The patient has a normal affect.  STUDIES:   I have reviewed the following: Mesenteric:  70 to 99% stenosis in the celiac artery and superior mesenteric artery.    NOTE: Area of lump in abdoman is at area of distal bypass and bifurcation.    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  She does not endorse postprandial abdominal pain but does have early satiety.  She does not have a fear of food.  She has lost over 50 pounds.  Lung cancer screenings have been negative.  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  I am unsure if this is the culprit because of her inconsistent symptoms.  I would like to get a better look at this with a CT angiogram.  I will order this and get it back within the next few weeks    Malvina New, IV, MD, FACS [facility] [phone] Pager [phone]

## 2023-09-11 ENCOUNTER — Encounter: Payer: Self-pay | Admitting: Surgery

## 2023-09-16 ENCOUNTER — Ambulatory Visit (HOSPITAL_COMMUNITY)
Admission: RE | Admit: 2023-09-16 | Discharge: 2023-09-16 | Disposition: A | Discharge status: Home / Self Care | Source: Ambulatory Visit | Attending: Surgery | Admitting: Surgery

## 2023-09-16 DIAGNOSIS — I6522 Occlusion and stenosis of left carotid artery: Secondary | ICD-10-CM | POA: Diagnosis not present

## 2023-09-16 DIAGNOSIS — Z951 Presence of aortocoronary bypass graft: Secondary | ICD-10-CM | POA: Diagnosis not present

## 2023-09-16 DIAGNOSIS — I700 Atherosclerosis of aorta: Secondary | ICD-10-CM | POA: Diagnosis not present

## 2023-09-16 DIAGNOSIS — T82858D Stenosis of vascular prosthetic devices, implants and grafts, subsequent encounter: Secondary | ICD-10-CM

## 2023-09-16 DIAGNOSIS — I701 Atherosclerosis of renal artery: Secondary | ICD-10-CM | POA: Diagnosis not present

## 2023-09-16 DIAGNOSIS — T82858A Stenosis of vascular prosthetic devices, implants and grafts, initial encounter: Secondary | ICD-10-CM | POA: Diagnosis present

## 2023-09-16 DIAGNOSIS — N289 Disorder of kidney and ureter, unspecified: Secondary | ICD-10-CM | POA: Diagnosis not present

## 2023-09-16 MED ORDER — IOHEXOL 350 MG/ML SOLN
75.0000 mL | Freq: Once | INTRAVENOUS | Status: AC | PRN
  Administered 2023-09-16: 75 mL via INTRAVENOUS

## 2023-09-22 ENCOUNTER — Encounter: Payer: Self-pay | Admitting: Surgery

## 2023-09-22 ENCOUNTER — Ambulatory Visit: Attending: Surgery | Admitting: Surgery

## 2023-09-22 VITALS — BP 131/74 | HR 57 | Temp 97.8°F | Ht 64.0 in | Wt 96.6 lb

## 2023-09-22 DIAGNOSIS — I70213 Atherosclerosis of native arteries of extremities with intermittent claudication, bilateral legs: Secondary | ICD-10-CM | POA: Diagnosis not present

## 2023-09-22 DIAGNOSIS — I6523 Occlusion and stenosis of bilateral carotid arteries: Secondary | ICD-10-CM

## 2023-09-22 NOTE — Progress Notes (Signed)
 [facility] [place]  Patient name: Kimberly [name] MRN: [id number] DOB: 01/09/1950 Sex: female   REASON FOR VISIT:    Follow up  HISOTRY OF PRESENT ILLNESS:    Kimberly [name] is a 73 y.o. female who returns today for follow-up.  On 09/16/2017 she underwent stenting of her left subclavian artery and angioplasty of her left brachial artery.  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  She remained asymptomatic and was discharged home.   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  She went back for lysis of adhesions secondary to abdominal pain in April.  In June 2013 she underwent left carotid endarterectomy for a high-grade asymptomatic stenosis.  She then later presented with dizziness and temporary vision loss.  On 06/24/2012 she had stenting of her left subclavian artery.  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  On 08/13/2013 she underwent right carotid endarterectomy for an asymptomatic right carotid stenosis   She went for angiography because of a left subclavian in-stent stenosis on 04/27/2015.  She was found to have a high-grade stenosis and this was stented.  On 04/30/2015 she presented with acute ischemia to the left hand.  She was taken to the operating room by Dr. fields who performed a thrombectomy. On 12/05/2015 she underwent drug coated balloon angioplasty of a left subclavian in-stent stenosis.  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     The patient is a smoker.  She is medically managed for hypertension which is been under good control.  She is not on cholesterol medicine for hypercholesterolemia.  She underwent cardiac catheterization and PCI in November 2021.   She continues to have issues with  early satiety and significant greater than 30 pound weight loss.  She reports no significant postprandial abdominal pain.  She is being seen by Dr. Agustina with GI.  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   Procedures: 02/26/2011: Aortobifemoral bypass graft (14 x 7 dacryon) for claudication 04/10/2011: Ischemic bowel secondary to closed-loop obstruction from adhesions 06/13/2011: Left carotid endarterectomy (asymptomatic) 06/24/2012: Stent, left subclavian artery (subclavian steal) 08/12/2013: Drug-coated balloon angioplasty, left subclavian artery (in-stent stenosis) 08/13/2013: Right carotid endarterectomy (asymptomatic) 04/26/2015: Stent, left subclavian artery (in-stent stenosis 04/30/2015: Upper extremity thrombectomy from prior catheterization 12/05/2015: Left subclavian artery drug-coated balloon angioplasty (in-stent stenosis with dizziness) 09/16/2017: Stent, left subclavian artery, angioplasty, left brachial artery 01/19/2019: Stent, left subclavian artery (dizziness)   PAST MEDICAL HISTORY:   Past Medical History:  Diagnosis Date   Anemia    Angina    March 2013   Anxiety    Asthma    Blood transfusion    CAD (coronary artery disease)    11/22/19- PCI ostial RCA, mRCA   Depression    Fibromyalgia    History of lead poisoning 1977   Hyperlipidemia    Hypertension    Migraine    Peripheral vascular disease (HCC)    claudication  right greater than left  femoral artery   Small bowel obstruction, partial (HCC) 04/08/11   Stroke (HCC)    Subclavian artery disease (HCC)    left   Uterine cancer (HCC)    Pre cervical     FAMILY HISTORY:   Family History  Problem Relation Age of Onset  Cancer Mother        breast, colon, ovarian   Cancer Brother 60       lung   Hyperlipidemia Brother    Cancer Other    Stroke Other    Cancer Maternal Grandmother        ovarian   Anesthesia problems Neg Hx     SOCIAL HISTORY:   Social History   Tobacco  Use   Smoking status: Every Day    Current packs/day: 0.75    Average packs/day: 0.8 packs/day for 45.0 years (33.8 ttl pk-yrs)    Types: Cigarettes   Smokeless tobacco: Never  Substance Use Topics   Alcohol  use: No    Alcohol /week: 0.0 standard drinks of alcohol      ALLERGIES:   Allergies  Allergen Reactions   Codeine Nausea And Vomiting and Nausea Only    Dizziness, headache, has passed out Dizzy and sick   Hydrocodone  Nausea And Vomiting and Other (See Comments)    Headache and dizziness   Other Shortness Of Breath    Allergy to cologne/perfume base   Oxycodone  Nausea And Vomiting and Other (See Comments)    Dizziness    Atorvastatin  Diarrhea and Nausea And Vomiting    Causes GI symptoms if she takes it every day.     CURRENT MEDICATIONS:   Current Outpatient Medications  Medication Sig Dispense Refill   acetaminophen  (TYLENOL ) 500 MG tablet Take 1,000 mg by mouth every 6 (six) hours as needed for moderate pain.     albuterol  (PROVENTIL  HFA;VENTOLIN  HFA) 108 (90 Base) MCG/ACT inhaler Inhale 1-2 puffs into the lungs every 6 (six) hours as needed for wheezing or shortness of breath. 1 Inhaler 0   Aromatic Inhalants (VICKS VAPOINHALER IN) Inhale 1 puff into the lungs daily as needed (congestion).     Ascorbic Acid  (VITAMIN C PO) Take 1 tablet by mouth daily.     ASPERCREME LIDOCAINE  EX Apply 1 application topically daily as needed (back pain).     clopidogrel  (PLAVIX ) 75 MG tablet TAKE 1 TABLET AT BEDTIME 90 tablet 3   Cyanocobalamin  (VITAMIN B-12) 5000 MCG TBDP Take 5,000 mcg by mouth daily.     diphenhydrAMINE -zinc  acetate (BENADRYL ) cream Apply 1 application  topically daily as needed for itching.     ezetimibe  (ZETIA ) 10 MG tablet TAKE 1 TABLET EVERY DAY 90 tablet 3   Ferrous Sulfate  (IRON PO) Take 1 tablet by mouth every 3 (three) days.     fluticasone  (FLONASE ) 50 MCG/ACT nasal spray Place 1 spray into both nostrils daily. (Patient taking differently: Place 1 spray  into both nostrils daily as needed for allergies.) 11.1 mL 1   irbesartan  (AVAPRO ) 75 MG tablet TAKE 1 TABLET EVERY DAY. DOSE CHANGE 90 tablet 0   loratadine  (CLARITIN ) 10 MG tablet Take 10 mg by mouth daily as needed for allergies.     nitroGLYCERIN  (NITROSTAT ) 0.4 MG SL tablet DISSOLVE 1 TAB UNDER TONGUE FOR CHEST PAIN - IF PAIN REMAINS AFTER 5 MIN, CALL 911 AND REPEAT DOSE. MAX 3 TABS IN 15 MINUTES 25 tablet 1   rosuvastatin  (CRESTOR ) 40 MG tablet Take 1 tablet (40 mg total) by mouth daily. 90 tablet 3   VITAMIN D  PO Take 1 capsule by mouth daily.     No current facility-administered medications for this visit.    REVIEW OF SYSTEMS:   [X]  denotes positive finding, [ ]  denotes negative finding Cardiac  Comments:  Chest pain or chest pressure:    Shortness  of breath upon exertion:    Short of breath when lying flat:    Irregular heart rhythm:        Vascular    Pain in calf, thigh, or hip brought on by ambulation:    Pain in feet at night that wakes you up from your sleep:     Blood clot in your veins:    Leg swelling:         Pulmonary    Oxygen at home:    Productive cough:     Wheezing:         Neurologic    Sudden weakness in arms or legs:     Sudden numbness in arms or legs:     Sudden onset of difficulty speaking or slurred speech:    Temporary loss of vision in one eye:     Problems with dizziness:         Gastrointestinal    Blood in stool:     Vomited blood:         Genitourinary    Burning when urinating:     Blood in urine:        Psychiatric    Major depression:         Hematologic    Bleeding problems:    Problems with blood clotting too easily:        Skin    Rashes or ulcers:        Constitutional    Fever or chills:      PHYSICAL EXAM:   Vitals:   09/22/23 0942  BP: 131/74  Pulse: (!) 57  Temp: 97.8 F (36.6 C)  SpO2: 98%  Weight: 96 lb 9.6 oz (43.8 kg)  Height: 5' 4 (1.626 m)    GENERAL: The patient is a well-nourished female,  in no acute distress. The vital signs are documented above. CARDIAC: There is a regular rate and rhythm PULMONARY: Non-labored respirations ABDOMEN: Soft and non-tender  MUSCULOSKELETAL: There are no major deformities or cyanosis. NEUROLOGIC: No focal weakness or paresthesias are detected. SKIN: There are no ulcers or rashes noted. PSYCHIATRIC: The patient has a normal affect.  STUDIES:   I have reviewed her CT scan with the following findings: 1. Patent aortobifemoral bypass graft without evidence of graft stenosis. 2. 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 3. A left subclavian stent is present which is grossly patent. 4. 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  Celiac: Moderate stenosis in the proximal segment estimated at 50-60%.   SMA: Moderate stenosis in the proximal segment of the SMA estimated at 60-70%.  MEDICAL ISSUES:   Weight loss: I obtained a CT scan to evaluate her for mesenteric disease, however her symptoms are really early satiety.  She does not endorse a fear of food.  She eats multiple small meals a day.  She is drinking a a lot of boost however this causes diarrhea.  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  I think it is a combination of caloric intake, energy expenditure, and possibly stress.  She is following up with GI.  I encouraged her to work on high protein diet and to try to decrease stress.  I will see her back in 6 months with carotid and ABIs.    Malvina Serene CLORE, MD, FACS [facility] [phone] Pager [phone]

## 2023-09-23 ENCOUNTER — Other Ambulatory Visit: Payer: Self-pay | Admitting: *Deleted

## 2023-09-23 DIAGNOSIS — I6523 Occlusion and stenosis of bilateral carotid arteries: Secondary | ICD-10-CM

## 2023-09-23 DIAGNOSIS — T82858D Stenosis of vascular prosthetic devices, implants and grafts, subsequent encounter: Secondary | ICD-10-CM

## 2023-09-23 DIAGNOSIS — I70213 Atherosclerosis of native arteries of extremities with intermittent claudication, bilateral legs: Secondary | ICD-10-CM

## 2023-10-05 ENCOUNTER — Other Ambulatory Visit: Payer: Self-pay | Admitting: Physician Assistant

## 2023-10-13 ENCOUNTER — Ambulatory Visit: Admitting: Surgery

## 2023-10-13 ENCOUNTER — Encounter (HOSPITAL_COMMUNITY)

## 2023-10-21 DIAGNOSIS — R6881 Early satiety: Secondary | ICD-10-CM | POA: Diagnosis not present

## 2023-10-21 DIAGNOSIS — R634 Abnormal weight loss: Secondary | ICD-10-CM | POA: Diagnosis not present

## 2023-10-23 ENCOUNTER — Other Ambulatory Visit: Payer: Self-pay | Admitting: Physician Assistant

## 2023-10-23 DIAGNOSIS — E785 Hyperlipidemia, unspecified: Secondary | ICD-10-CM

## 2023-11-14 ENCOUNTER — Telehealth (HOSPITAL_COMMUNITY): Payer: Self-pay | Admitting: Licensed Clinical Social Worker

## 2023-11-14 NOTE — Progress Notes (Signed)
  Heart and Vascular Care Navigation  11/14/2023  Kimberly [name] January 28, 1950 [id number]  CSW received call from pt regarding concerns with food insecurity.  Patient reports she lives at home with 72yo and 16yo grandchildren for whom she is the sole caregiver.  States she gets tree surgeon and this is barely enough to cover expenses.  With food stamp allotment reduced this month she is concerned for getting food.  Was provided food pantry list from [facility] staff but needing food today and unfamiliar with who would be open to assist now.  CSW provided referral for [employer] who is open on Fridays from 10-1 patient lives close by and has no barriers to going today to pick up food.  CSW inquired about other concerns.  She states she is frequently behind on her utility bills.  Just had assistance from [facility] catching up on her electric bill.  Has payment plan with [employer] and owes $167 by 11/23rd but is unsure how she will manage- applied for [employer] but they don't start providing assistance until December 1.  CSW discussed patient care fund referral process- patient will need to provide food stamp card copy and [employer] bill as well as sign grant acknowledgement and return to CSW.  CSW mailing out grant acknowledgement for signature.   Engaged with patient face to face for initial visit for Heart and Vascular Care Coordination.                                                                                                    Social History:                                                                             SDOH Screenings   Food Insecurity: Food Insecurity Present (11/14/2023)  Utilities: At Risk (11/14/2023)  Financial Resource Strain: High Risk (11/14/2023)  Tobacco Use: High Risk (09/22/2023)    SDOH Interventions: Financial Resources:  Surveyor, Quantity Strain Interventions: [employer] Provided Retirement income  Food Insecurity:  Food Insecurity Interventions: [employer] Provided   Housing Insecurity:   None reported  Transportation:   No concerns reported   Follow-up plan:    CSW will assist pt in completing patient care fund referral and assist with outstanding bill as able.  Kimberly HILARIO Leech, LCSW Clinical Social Worker [facility] Desk#: [phone] Cell#: [phone]

## 2023-11-21 ENCOUNTER — Telehealth (HOSPITAL_COMMUNITY): Payer: Self-pay | Admitting: Licensed Clinical Social Worker

## 2023-11-21 NOTE — Telephone Encounter (Signed)
 H&V Care Navigation CSW Progress Note  Required documents received to assist with [employer] bill- bill paid.  No other needs reported at this time  Kimberly HILARIO Leech, LCSW Clinical Social Worker [facility] Desk#: [phone] Cell#: [phone]

## 2023-11-22 ENCOUNTER — Other Ambulatory Visit: Payer: Self-pay | Admitting: Cardiology

## 2023-11-22 DIAGNOSIS — E785 Hyperlipidemia, unspecified: Secondary | ICD-10-CM

## 2023-12-02 ENCOUNTER — Telehealth: Payer: Self-pay | Admitting: Cardiology

## 2023-12-02 DIAGNOSIS — E785 Hyperlipidemia, unspecified: Secondary | ICD-10-CM

## 2023-12-02 MED ORDER — ROSUVASTATIN CALCIUM 40 MG PO TABS: 40.0000 mg | ORAL_TABLET | Freq: Every day | ORAL | 0 refills | Status: AC

## 2023-12-02 NOTE — Telephone Encounter (Signed)
 Patient has appt on 02/13/24. Please see below

## 2023-12-02 NOTE — Telephone Encounter (Signed)
 Refills has been sent to the pharmacy.

## 2023-12-02 NOTE — Telephone Encounter (Signed)
*  STAT* If patient is at the pharmacy, call can be transferred to refill team.   1. Which medications need to be refilled? (please list name of each medication and dose if known) rosuvastatin  (CRESTOR ) 40 MG tablet   2. Which pharmacy/location (including street and city if local pharmacy) is medication to be sent to?  [facility] - [place], [place] - [address]    3. Do they need a 30 day or 90 day supply? 90

## 2023-12-03 DIAGNOSIS — I700 Atherosclerosis of aorta: Secondary | ICD-10-CM | POA: Diagnosis not present

## 2023-12-03 DIAGNOSIS — I25119 Atherosclerotic heart disease of native coronary artery with unspecified angina pectoris: Secondary | ICD-10-CM | POA: Diagnosis not present

## 2023-12-03 DIAGNOSIS — I10 Essential (primary) hypertension: Secondary | ICD-10-CM | POA: Diagnosis not present

## 2023-12-03 DIAGNOSIS — Z9989 Dependence on other enabling machines and devices: Secondary | ICD-10-CM | POA: Diagnosis not present

## 2023-12-03 DIAGNOSIS — M1990 Unspecified osteoarthritis, unspecified site: Secondary | ICD-10-CM | POA: Diagnosis not present

## 2023-12-03 DIAGNOSIS — Z7902 Long term (current) use of antithrombotics/antiplatelets: Secondary | ICD-10-CM | POA: Diagnosis not present

## 2023-12-03 DIAGNOSIS — F17210 Nicotine dependence, cigarettes, uncomplicated: Secondary | ICD-10-CM | POA: Diagnosis not present

## 2023-12-03 DIAGNOSIS — K121 Other forms of stomatitis: Secondary | ICD-10-CM | POA: Diagnosis not present

## 2023-12-03 DIAGNOSIS — Z59869 Financial insecurity, unspecified: Secondary | ICD-10-CM | POA: Diagnosis not present

## 2023-12-03 DIAGNOSIS — I701 Atherosclerosis of renal artery: Secondary | ICD-10-CM | POA: Diagnosis not present

## 2023-12-03 DIAGNOSIS — I70209 Unspecified atherosclerosis of native arteries of extremities, unspecified extremity: Secondary | ICD-10-CM | POA: Diagnosis not present

## 2023-12-03 DIAGNOSIS — J449 Chronic obstructive pulmonary disease, unspecified: Secondary | ICD-10-CM | POA: Diagnosis not present

## 2023-12-03 DIAGNOSIS — D6859 Other primary thrombophilia: Secondary | ICD-10-CM | POA: Diagnosis not present

## 2023-12-03 DIAGNOSIS — Z885 Allergy status to narcotic agent status: Secondary | ICD-10-CM | POA: Diagnosis not present

## 2023-12-03 DIAGNOSIS — K551 Chronic vascular disorders of intestine: Secondary | ICD-10-CM | POA: Diagnosis not present

## 2023-12-03 DIAGNOSIS — E785 Hyperlipidemia, unspecified: Secondary | ICD-10-CM | POA: Diagnosis not present

## 2023-12-03 DIAGNOSIS — Z681 Body mass index (BMI) 19 or less, adult: Secondary | ICD-10-CM | POA: Diagnosis not present

## 2023-12-21 ENCOUNTER — Other Ambulatory Visit: Payer: Self-pay | Admitting: Cardiology

## 2024-02-04 NOTE — Progress Notes (Unsigned)
 "    HPI: FU CAD. Patient does have a history of peripheral vascular disease and has had previous left subclavian artery stent and left brachial artery angioplasty, right brachial artery embolectomy, previous left carotid endarterectomy and aortobifemoral bypass graft. Cardiac catheterization November 2021 showed 99% ostial right coronary artery, 70% proximal right coronary artery, 50% circumflex, 60% mid LAD and 50% second diagonal.  Ejection fraction 55 to 65%. Patient subsequently had PCI of the right coronary artery with drug-eluting stents x2. Echocardiogram October 2024 showed ejection fraction 60 to 65%, grade 1 diastolic dysfunction.  Monitor October 2024 showed sinus rhythm with occasional PAC, short runs of SVT and rare PVC.  ABIs April 2025 normal with abnormal right toe-brachial index on the right and moderate on the left.  Carotid Dopplers April 2025 showed 60 to 79% right and 1 to 39% left stenosis.  CTA September 2025 showed patent aorto bifemoral bypass graft, moderate stenosis of the celiac and SMA and severe stenosis of the right main renal artery.  Since last seen   Current Outpatient Medications  Medication Sig Dispense Refill   acetaminophen  (TYLENOL ) 500 MG tablet Take 1,000 mg by mouth every 6 (six) hours as needed for moderate pain.     albuterol  (PROVENTIL  HFA;VENTOLIN  HFA) 108 (90 Base) MCG/ACT inhaler Inhale 1-2 puffs into the lungs every 6 (six) hours as needed for wheezing or shortness of breath. 1 Inhaler 0   Aromatic Inhalants (VICKS VAPOINHALER IN) Inhale 1 puff into the lungs daily as needed (congestion).     Ascorbic Acid  (VITAMIN C PO) Take 1 tablet by mouth daily.     ASPERCREME LIDOCAINE  EX Apply 1 application topically daily as needed (back pain).     clopidogrel  (PLAVIX ) 75 MG tablet TAKE 1 TABLET AT BEDTIME 90 tablet 3   Cyanocobalamin  (VITAMIN B-12) 5000 MCG TBDP Take 5,000 mcg by mouth daily.     diphenhydrAMINE -zinc  acetate (BENADRYL ) cream Apply 1 application   topically daily as needed for itching.     ezetimibe  (ZETIA ) 10 MG tablet TAKE 1 TABLET EVERY DAY 90 tablet 3   Ferrous Sulfate  (IRON PO) Take 1 tablet by mouth every 3 (three) days.     fluticasone  (FLONASE ) 50 MCG/ACT nasal spray Place 1 spray into both nostrils daily. (Patient taking differently: Place 1 spray into both nostrils daily as needed for allergies.) 11.1 mL 1   irbesartan  (AVAPRO ) 75 MG tablet TAKE 1 TABLET EVERY DAY . (NEED MD APPOINTMENT FOR REFILLS) 90 tablet 0   loratadine  (CLARITIN ) 10 MG tablet Take 10 mg by mouth daily as needed for allergies.     nitroGLYCERIN  (NITROSTAT ) 0.4 MG SL tablet DISSOLVE 1 TAB UNDER TONGUE FOR CHEST PAIN - IF PAIN REMAINS AFTER 5 MIN, CALL 911 AND REPEAT DOSE. MAX 3 TABS IN 15 MINUTES 25 tablet 1   rosuvastatin  (CRESTOR ) 40 MG tablet Take 1 tablet (40 mg total) by mouth daily. 90 tablet 0   VITAMIN D  PO Take 1 capsule by mouth daily.     No current facility-administered medications for this visit.     Past Medical History:  Diagnosis Date   Anemia    Angina    March 2013   Anxiety    Asthma    Blood transfusion    CAD (coronary artery disease)    11/22/19- PCI ostial RCA, mRCA   Depression    Fibromyalgia    History of lead poisoning 1977   Hyperlipidemia    Hypertension    Migraine  Peripheral vascular disease    claudication  right greater than left  femoral artery   Small bowel obstruction, partial (HCC) 04/08/11   Stroke (HCC)    Subclavian artery disease    left   Uterine cancer (HCC)    Pre cervical    Past Surgical History:  Procedure Laterality Date   abdominal angiogram     ABDOMINAL ANGIOGRAM N/A 02/05/2011   Procedure: ABDOMINAL ANGIOGRAM;  Surgeon: Gaile LELON New, MD;  Location: [facility] CATH LAB;  Service: Cardiovascular;  Laterality: N/A;   ABDOMINAL HYSTERECTOMY  2008   AORTA - BILATERAL FEMORAL ARTERY BYPASS GRAFT  02/26/2011   Procedure: AORTA BIFEMORAL BYPASS GRAFT;  Surgeon: LULLA Malvina New, MD;  Location: [facility]  OR;  Service: Vascular;  Laterality: N/A;   AORTIC ARCH ANGIOGRAPHY N/A 09/16/2017   Procedure: AORTIC ARCH ANGIOGRAPHY;  Surgeon: New Gaile LELON, MD;  Location: [facility] INVASIVE CV LAB;  Service: Cardiovascular;  Laterality: N/A;   AORTIC ARCH ANGIOGRAPHY N/A 01/19/2019   Procedure: AORTIC ARCH ANGIOGRAPHY;  Surgeon: New Gaile LELON, MD;  Location: [facility] INVASIVE CV LAB;  Service: Cardiovascular;  Laterality: N/A;  Left upper extremity angiography   ARCH AORTOGRAM N/A 06/24/2012   Procedure: ARCH AORTOGRAM;  Surgeon: Gaile LELON New, MD;  Location: [facility] CATH LAB;  Service: Cardiovascular;  Laterality: N/A;   BILATERAL UPPER EXTREMITY ANGIOGRAM Left 08/12/2013   Procedure: UPPER EXTREMITY ANGIOGRAM;  Surgeon: Gaile LELON New, MD;  Location: [facility] CATH LAB;  Service: Cardiovascular;  Laterality: Left;   BIOPSY  07/22/2022   Procedure: BIOPSY;  Surgeon: Kriss Estefana DEL, DO;  Location: [facility] ENDOSCOPY;  Service: Gastroenterology;;   CAROTID ENDARTERECTOMY Left 06/13/2011   Left  CEA   CAROTID ENDARTERECTOMY Right 08-13-13   CEA   CHOLECYSTECTOMY N/A 12/08/2012   Procedure: LAPAROSCOPIC CHOLECYSTECTOMY WITH INTRAOPERATIVE CHOLANGIOGRAM;  Surgeon: Sherlean JINNY Laughter, MD;  Location: [facility] OR;  Service: General;  Laterality: N/A;   COLONOSCOPY WITH PROPOFOL  N/A 06/16/2014   Procedure: COLONOSCOPY WITH PROPOFOL ;  Surgeon: Gladis MARLA Louder, MD;  Location: [facility] ENDOSCOPY;  Service: Endoscopy;  Laterality: N/A;   COLONOSCOPY WITH PROPOFOL  N/A 07/22/2022   Procedure: COLONOSCOPY WITH PROPOFOL ;  Surgeon: Kriss Estefana DEL, DO;  Location: [facility] ENDOSCOPY;  Service: Gastroenterology;  Laterality: N/A;   CORONARY STENT INTERVENTION N/A 11/22/2019   Procedure: CORONARY STENT INTERVENTION;  Surgeon: Mady Bruckner, MD;  Location: [facility] INVAS[facility] CV LAB;  Service: Cardiovascular;  Laterality: N/A;   EMBOLECTOMY Left 04/30/2015   Procedure: EMBOLECTOMY BRACHIAL, Radial and ulnar arteries with patch angioplasty of brachial artery.;  Surgeon:  Carlin FORBES Haddock, MD;  Location: [facility] OR;  Service: Vascular;  Laterality: Left;   ENDARTERECTOMY  06/13/2011   Procedure: ENDARTERECTOMY CAROTID;  Surgeon: Gaile LELON New, MD;  Location: [facility] OR;  Service: Vascular;  Laterality: Left;  Left carotid artery endarterectomy with vascu-guard patch angioplasty   ENDARTERECTOMY Right 08/13/2013   Procedure: RIGHT  CAROTID ENDARTERECTOMY CAROTID WITH BOVINE PERICARDIAL PATCH ANGIOPLASTY;  Surgeon: Gaile LELON New, MD;  Location: [facility] OR;  Service: Vascular;  Laterality: Right;   ESOPHAGOGASTRODUODENOSCOPY (EGD) WITH PROPOFOL  N/A 06/16/2014   Procedure: ESOPHAGOGASTRODUODENOSCOPY (EGD) WITH PROPOFOL ;  Surgeon: Gladis MARLA Louder, MD;  Location: [facility] ENDOSCOPY;  Service: Endoscopy;  Laterality: N/A;   ESOPHAGOGASTRODUODENOSCOPY (EGD) WITH PROPOFOL  N/A 07/22/2022   Procedure: ESOPHAGOGASTRODUODENOSCOPY (EGD) WITH PROPOFOL ;  Surgeon: Kriss Estefana DEL, DO;  Location: [facility] ENDOSCOPY;  Service: Gastroenterology;  Laterality: N/A;   LAPAROTOMY  04/10/2011   Procedure: EXPLORATORY LAPAROTOMY;  Surgeon: Alm DEL Angle,  MD;  Location: [facility] OR;  Service: General;  Laterality: N/A;  ENTEROLYSIS OF ADHESIONS   LEFT HEART CATH AND CORONARY ANGIOGRAPHY N/A 11/19/2019   Procedure: LEFT HEART CATH AND CORONARY ANGIOGRAPHY;  Surgeon: Darron Deatrice LABOR, MD;  Location: [facility] INVAS[facility] CV LAB;  Service: Cardiovascular;  Laterality: N/A;   LOWER EXTREMITY ANGIOGRAM Left 06/24/12   Left stent intervention, left subclavian   PERIPHERAL VASCULAR BALLOON ANGIOPLASTY Left 09/16/2017   Procedure: PERIPHERAL VASCULAR BALLOON ANGIOPLASTY;  Surgeon: Serene Gaile ORN, MD;  Location: [facility] CV LAB;  Service: Cardiovascular;  Laterality: Left;  brachial artery   PERIPHERAL VASCULAR BALLOON ANGIOPLASTY Left 01/19/2019   Procedure: PERIPHERAL VASCULAR BALLOON ANGIOPLASTY;  Surgeon: Serene Gaile ORN, MD;  Location: [facility] LAB;  Service: Cardiovascular;  Laterality: Left;  brachial artery   PERIPHERAL  VASCULAR CATHETERIZATION N/A 04/26/2015   Procedure: Aortic Arch Angiography;  Surgeon: Gaile ORN Serene, MD;  Location: [facility] INVASIVE CV LAB;  Service: Cardiovascular;  Laterality: N/A;   PERIPHERAL VASCULAR CATHETERIZATION  04/26/2015   Procedure: Peripheral Vascular Intervention;  Surgeon: Gaile ORN Serene, MD;  Location: [facility] INVAS[facility] CV LAB;  Service: Cardiovascular;;  lt illiac   PERIPHERAL VASCULAR CATHETERIZATION N/A 12/05/2015   Procedure: Aortic Arch Angiography;  Surgeon: Gaile ORN Serene, MD;  Location: [facility] INVASIVE CV LAB;  Service: Cardiovascular;  Laterality: N/A;   PERIPHERAL VASCULAR CATHETERIZATION Left 12/05/2015   Procedure: Peripheral Vascular Intervention;  Surgeon: Gaile ORN Serene, MD;  Location: [facility] INVAS[facility] CV LAB;  Service: Cardiovascular;  Laterality: Left;  subclavian   PERIPHERAL VASCULAR INTERVENTION Left 09/16/2017   Procedure: PERIPHERAL VASCULAR INTERVENTION;  Surgeon: Serene Gaile ORN, MD;  Location: [facility] INV[facility] LAB;  Service: Cardiovascular;  Laterality: Left;  subclavian artery   PERIPHERAL VASCULAR INTERVENTION Left 01/19/2019   Procedure: PERIPHERAL VASCULAR INTERVENTION;  Surgeon: Serene Gaile ORN, MD;  Location: [facility] INV[facility] CV LAB;  Service: Cardiovascular;  Laterality: Left;  subclavian stent   POLYPECTOMY  07/22/2022   Procedure: POLYPECTOMY;  Surgeon: Kriss Estefana DEL, DO;  Location: [facility] ENDOSCOPY;  Service: Gastroenterology;;   PR VEIN BYPASS GRAFT,AORTO-FEM-POP  02/26/11   UPPER EXTREMITY ANGIOGRAPHY Left 09/16/2017   Procedure: Upper Extremity Angiography;  Surgeon: Serene Gaile ORN, MD;  Location: [facility] INVASIVE CV LAB;  Service: Cardiovascular;  Laterality: Left;    Social History   Socioeconomic History   Marital status: Legally Separated    Spouse name: Not on file   Number of children: 2   Years of education: Not on file   Highest education level: Not on file  Occupational History   Not on file  Tobacco Use   Smoking status: Every Day    Current  packs/day: 0.75    Average packs/day: 0.8 packs/day for 45.0 years (33.8 ttl pk-yrs)    Types: Cigarettes   Smokeless tobacco: Never  Vaping Use   Vaping status: Never Used  Substance and Sexual Activity   Alcohol  use: No    Alcohol /week: 0.0 standard drinks of alcohol    Drug use: No    Comment: History of Cocaine and marijuana abuse: not in many years (04/08/11   Sexual activity: Not Currently  Other Topics Concern   Not on file  Social History Narrative   Pt lives at home with her 2 grandsons. She has been separated for 7yrs, has two children, and has a 12th grade education level. She drinks 3 cups of coffee daily.   Social Drivers of Health   Tobacco Use: High Risk (09/22/2023)  Patient History    Smoking Tobacco Use: Every Day    Smokeless Tobacco Use: Never    Passive Exposure: Not on file  Financial Resource Strain: High Risk (11/14/2023)   Overall Financial Resource Strain (CARDIA)    Difficulty of Paying Living Expenses: Hard  Food Insecurity: Food Insecurity Present (11/14/2023)   Epic    Worried About Programme Researcher, Broadcasting/film/video in the Last Year: Sometimes true    Barista in the Last Year: Sometimes true  Transportation Needs: Not on file  Physical Activity: Not on file  Stress: Not on file  Social Connections: Not on file  Intimate Partner Violence: Not on file  Depression (PHQ2-9): Not on file  Alcohol  Screen: Not on file  Housing: Not on file  Utilities: At Risk (11/14/2023)   Epic    Threatened with loss of utilities: Yes  Health Literacy: Not on file    Family History  Problem Relation Age of Onset   Cancer Mother        breast, colon, ovarian   Cancer Brother 69       lung   Hyperlipidemia Brother    Cancer Other    Stroke Other    Cancer Maternal Grandmother        ovarian   Anesthesia problems Neg Hx     ROS: no fevers or chills, productive cough, hemoptysis, dysphasia, odynophagia, melena, hematochezia, dysuria, hematuria, rash, seizure  activity, orthopnea, PND, pedal edema, claudication. Remaining systems are negative.  Physical Exam: Well-developed well-nourished in no acute distress.  Skin is warm and dry.  HEENT is normal.  Neck is supple.  Chest is clear to auscultation with normal expansion.  Cardiovascular exam is regular rate and rhythm.  Abdominal exam nontender or distended. No masses palpated. Extremities show no edema. neuro grossly intact  ECG- personally reviewed  A/P  1 coronary artery disease-patient denies chest pain.  Continue Plavix  and statin.  Decision made previously to continue Plavix  long-term given history of residual coronary disease and peripheral vascular disease.  2 hyperlipidemia-continue Crestor  at 20 mg daily (she was agreeable only to 20 mg in the past).  Continue Zetia  and Repatha .  3 hypertension-patient's blood pressure is controlled.  4 peripheral vascular disease/carotid artery disease-followed by vascular surgery.  5 tobacco abuse-patient has again been counseled on discontinuing.  Redell Shallow, MD    "

## 2024-02-13 ENCOUNTER — Ambulatory Visit: Admitting: Cardiology

## 2024-03-22 ENCOUNTER — Encounter (HOSPITAL_COMMUNITY)

## 2024-03-22 ENCOUNTER — Ambulatory Visit: Admitting: Surgery

## 2024-06-04 ENCOUNTER — Ambulatory Visit: Admitting: Cardiology
